# Patient Record
Sex: Female | Born: 1955
Health system: Southern US, Community
[De-identification: ages and names within clinical notes are randomized; demographics above are authoritative.]

## PROBLEM LIST (undated history)

## (undated) DIAGNOSIS — K219 Gastro-esophageal reflux disease without esophagitis: Secondary | ICD-10-CM

## (undated) DIAGNOSIS — M26609 Unspecified temporomandibular joint disorder, unspecified side: Secondary | ICD-10-CM

## (undated) DIAGNOSIS — F411 Generalized anxiety disorder: Secondary | ICD-10-CM

## (undated) DIAGNOSIS — G709 Myoneural disorder, unspecified: Secondary | ICD-10-CM

## (undated) DIAGNOSIS — E039 Hypothyroidism, unspecified: Secondary | ICD-10-CM

## (undated) DIAGNOSIS — M899 Disorder of bone, unspecified: Secondary | ICD-10-CM

## (undated) DIAGNOSIS — Z87898 Personal history of other specified conditions: Secondary | ICD-10-CM

## (undated) DIAGNOSIS — J309 Allergic rhinitis, unspecified: Secondary | ICD-10-CM

## (undated) DIAGNOSIS — C541 Malignant neoplasm of endometrium: Secondary | ICD-10-CM

## (undated) DIAGNOSIS — IMO0002 Reserved for concepts with insufficient information to code with codable children: Secondary | ICD-10-CM

## (undated) DIAGNOSIS — C801 Malignant (primary) neoplasm, unspecified: Secondary | ICD-10-CM

## (undated) DIAGNOSIS — M1990 Unspecified osteoarthritis, unspecified site: Secondary | ICD-10-CM

## (undated) DIAGNOSIS — D649 Anemia, unspecified: Secondary | ICD-10-CM

## (undated) DIAGNOSIS — J189 Pneumonia, unspecified organism: Secondary | ICD-10-CM

## (undated) DIAGNOSIS — M8430XA Stress fracture, unspecified site, initial encounter for fracture: Secondary | ICD-10-CM

## (undated) DIAGNOSIS — I499 Cardiac arrhythmia, unspecified: Secondary | ICD-10-CM

## (undated) DIAGNOSIS — IMO0001 Reserved for inherently not codable concepts without codable children: Secondary | ICD-10-CM

## (undated) DIAGNOSIS — K279 Peptic ulcer, site unspecified, unspecified as acute or chronic, without hemorrhage or perforation: Secondary | ICD-10-CM

## (undated) DIAGNOSIS — M949 Disorder of cartilage, unspecified: Secondary | ICD-10-CM

## (undated) DIAGNOSIS — J45909 Unspecified asthma, uncomplicated: Secondary | ICD-10-CM

## (undated) HISTORY — DX: Anemia, unspecified: D64.9

## (undated) HISTORY — DX: Disorder of bone, unspecified: M89.9

## (undated) HISTORY — PX: OTHER SURGICAL HISTORY: SHX169

## (undated) HISTORY — DX: Reserved for inherently not codable concepts without codable children: IMO0001

## (undated) HISTORY — DX: Hypothyroidism, unspecified: E03.9

## (undated) HISTORY — DX: Generalized anxiety disorder: F41.1

## (undated) HISTORY — DX: Stress fracture, unspecified site, initial encounter for fracture: M84.30XA

## (undated) HISTORY — DX: Unspecified temporomandibular joint disorder, unspecified side: M26.609

## (undated) HISTORY — DX: Peptic ulcer, site unspecified, unspecified as acute or chronic, without hemorrhage or perforation: K27.9

## (undated) HISTORY — DX: Gastro-esophageal reflux disease without esophagitis: K21.9

## (undated) HISTORY — DX: Disorder of cartilage, unspecified: M94.9

## (undated) HISTORY — DX: Malignant (primary) neoplasm, unspecified: C80.1

## (undated) HISTORY — DX: Unspecified asthma, uncomplicated: J45.909

## (undated) HISTORY — DX: Personal history of other specified conditions: Z87.898

## (undated) HISTORY — DX: Reserved for concepts with insufficient information to code with codable children: IMO0002

## (undated) HISTORY — PX: DILATION AND CURETTAGE OF UTERUS: SHX78

## (undated) HISTORY — DX: Allergic rhinitis, unspecified: J30.9

---

## 1998-02-05 ENCOUNTER — Encounter: Admission: RE | Admit: 1998-02-05 | Discharge: 1998-02-05 | Payer: Self-pay | Admitting: *Deleted

## 1998-02-21 ENCOUNTER — Other Ambulatory Visit: Admission: RE | Admit: 1998-02-21 | Discharge: 1998-02-21 | Payer: Self-pay | Admitting: Gynecology

## 1999-02-25 ENCOUNTER — Other Ambulatory Visit: Admission: RE | Admit: 1999-02-25 | Discharge: 1999-02-25 | Payer: Self-pay | Admitting: Gynecology

## 1999-06-10 ENCOUNTER — Encounter: Admission: RE | Admit: 1999-06-10 | Discharge: 1999-07-16 | Payer: Self-pay | Admitting: Internal Medicine

## 1999-10-29 LAB — HM MAMMOGRAPHY

## 2000-03-23 ENCOUNTER — Other Ambulatory Visit: Admission: RE | Admit: 2000-03-23 | Discharge: 2000-03-23 | Payer: Self-pay | Admitting: Gynecology

## 2001-03-11 ENCOUNTER — Other Ambulatory Visit: Admission: RE | Admit: 2001-03-11 | Discharge: 2001-03-11 | Payer: Self-pay | Admitting: Gynecology

## 2002-03-15 ENCOUNTER — Other Ambulatory Visit: Admission: RE | Admit: 2002-03-15 | Discharge: 2002-03-15 | Payer: Self-pay | Admitting: Gynecology

## 2003-03-27 ENCOUNTER — Other Ambulatory Visit: Admission: RE | Admit: 2003-03-27 | Discharge: 2003-03-27 | Payer: Self-pay | Admitting: Gynecology

## 2003-11-06 ENCOUNTER — Ambulatory Visit (HOSPITAL_COMMUNITY): Admission: RE | Admit: 2003-11-06 | Discharge: 2003-11-06 | Payer: Self-pay | Admitting: Gastroenterology

## 2004-08-28 ENCOUNTER — Other Ambulatory Visit: Admission: RE | Admit: 2004-08-28 | Discharge: 2004-08-28 | Payer: Self-pay | Admitting: Gynecology

## 2004-09-16 ENCOUNTER — Ambulatory Visit: Payer: Self-pay | Admitting: Internal Medicine

## 2005-09-01 ENCOUNTER — Ambulatory Visit: Payer: Self-pay | Admitting: Internal Medicine

## 2006-03-04 ENCOUNTER — Ambulatory Visit: Payer: Self-pay | Admitting: Internal Medicine

## 2006-07-29 ENCOUNTER — Ambulatory Visit: Payer: Self-pay | Admitting: Internal Medicine

## 2006-08-12 ENCOUNTER — Ambulatory Visit: Payer: Self-pay | Admitting: Internal Medicine

## 2006-08-12 LAB — CONVERTED CEMR LAB
ALT: 18 units/L (ref 0–40)
AST: 25 units/L (ref 0–37)
Albumin: 3.6 g/dL (ref 3.5–5.2)
Alkaline Phosphatase: 52 units/L (ref 39–117)
BUN: 14 mg/dL (ref 6–23)
Basophils Absolute: 0 10*3/uL (ref 0.0–0.1)
Basophils Relative: 0.5 % (ref 0.0–1.0)
Bilirubin Urine: NEGATIVE
Bilirubin, Direct: 0.1 mg/dL (ref 0.0–0.3)
CO2: 32 meq/L (ref 19–32)
Calcium: 8.9 mg/dL (ref 8.4–10.5)
Chloride: 104 meq/L (ref 96–112)
Cholesterol: 195 mg/dL (ref 0–200)
Creatinine, Ser: 0.8 mg/dL (ref 0.4–1.2)
Eosinophils Absolute: 0.1 10*3/uL (ref 0.0–0.6)
Eosinophils Relative: 3.1 % (ref 0.0–5.0)
GFR calc Af Amer: 98 mL/min
GFR calc non Af Amer: 81 mL/min
Glucose, Bld: 90 mg/dL (ref 70–99)
HCT: 39.8 % (ref 36.0–46.0)
HDL: 75.9 mg/dL (ref >39.0)
Hemoglobin, Urine: NEGATIVE
Hemoglobin: 14.2 g/dL (ref 12.0–15.0)
Ketones, ur: NEGATIVE mg/dL
LDL Cholesterol: 107 mg/dL — ABNORMAL HIGH (ref 0–99)
Leukocytes, UA: NEGATIVE
Lymphocytes Relative: 35.7 % (ref 12.0–46.0)
MCHC: 35.6 g/dL (ref 30.0–36.0)
MCV: 91.2 fL (ref 78.0–100.0)
Monocytes Absolute: 0.3 10*3/uL (ref 0.2–0.7)
Monocytes Relative: 6.7 % (ref 3.0–11.0)
Neutro Abs: 2.1 10*3/uL (ref 1.4–7.7)
Neutrophils Relative %: 54 % (ref 43.0–77.0)
Nitrite: NEGATIVE
Platelets: 258 10*3/uL (ref 150–400)
Potassium: 3.9 meq/L (ref 3.5–5.1)
RBC: 4.36 M/uL (ref 3.87–5.11)
RDW: 12.3 % (ref 11.5–14.6)
Sodium: 142 meq/L (ref 135–145)
Specific Gravity, Urine: >=1.03 (ref 1.000–1.03)
TSH: 2.24 microintl units/mL (ref 0.35–5.50)
Total Bilirubin: 1.1 mg/dL (ref 0.3–1.2)
Total CHOL/HDL Ratio: 2.6
Total Protein, Urine: NEGATIVE mg/dL
Total Protein: 6 g/dL (ref 6.0–8.3)
Triglycerides: 60 mg/dL (ref 0–149)
Urine Glucose: NEGATIVE mg/dL
Urobilinogen, UA: 0.2 (ref 0.0–1.0)
VLDL: 12 mg/dL (ref 0–40)
WBC: 3.9 10*3/uL — ABNORMAL LOW (ref 4.5–10.5)
pH: 6 (ref 5.0–8.0)

## 2006-08-14 ENCOUNTER — Ambulatory Visit: Payer: Self-pay | Admitting: Internal Medicine

## 2006-09-22 ENCOUNTER — Ambulatory Visit: Payer: Self-pay | Admitting: Internal Medicine

## 2006-10-23 ENCOUNTER — Ambulatory Visit: Payer: Self-pay | Admitting: Internal Medicine

## 2007-02-19 ENCOUNTER — Ambulatory Visit: Payer: Self-pay | Admitting: Internal Medicine

## 2007-03-02 ENCOUNTER — Ambulatory Visit: Payer: Self-pay | Admitting: Internal Medicine

## 2007-03-02 DIAGNOSIS — K219 Gastro-esophageal reflux disease without esophagitis: Secondary | ICD-10-CM

## 2007-03-02 DIAGNOSIS — Z87898 Personal history of other specified conditions: Secondary | ICD-10-CM | POA: Insufficient documentation

## 2007-03-02 DIAGNOSIS — J309 Allergic rhinitis, unspecified: Secondary | ICD-10-CM | POA: Insufficient documentation

## 2007-03-02 DIAGNOSIS — F411 Generalized anxiety disorder: Secondary | ICD-10-CM

## 2007-03-02 DIAGNOSIS — K279 Peptic ulcer, site unspecified, unspecified as acute or chronic, without hemorrhage or perforation: Secondary | ICD-10-CM | POA: Insufficient documentation

## 2007-03-02 DIAGNOSIS — Z85828 Personal history of other malignant neoplasm of skin: Secondary | ICD-10-CM | POA: Insufficient documentation

## 2007-03-02 HISTORY — DX: Allergic rhinitis, unspecified: J30.9

## 2007-03-02 HISTORY — DX: Gastro-esophageal reflux disease without esophagitis: K21.9

## 2007-03-02 HISTORY — DX: Personal history of other specified conditions: Z87.898

## 2007-03-02 HISTORY — DX: Generalized anxiety disorder: F41.1

## 2007-03-02 HISTORY — DX: Peptic ulcer, site unspecified, unspecified as acute or chronic, without hemorrhage or perforation: K27.9

## 2007-05-18 ENCOUNTER — Encounter: Payer: Self-pay | Admitting: Internal Medicine

## 2007-06-23 ENCOUNTER — Emergency Department (HOSPITAL_COMMUNITY): Admission: EM | Admit: 2007-06-23 | Discharge: 2007-06-23 | Payer: Self-pay | Admitting: Family Medicine

## 2007-07-01 HISTORY — PX: OTHER SURGICAL HISTORY: SHX169

## 2007-08-02 ENCOUNTER — Encounter: Payer: Self-pay | Admitting: Internal Medicine

## 2007-11-08 ENCOUNTER — Ambulatory Visit: Payer: Self-pay | Admitting: Internal Medicine

## 2007-11-08 LAB — CONVERTED CEMR LAB: Vit D, 1,25-Dihydroxy: 39 (ref 30–89)

## 2007-11-12 ENCOUNTER — Ambulatory Visit: Payer: Self-pay | Admitting: Internal Medicine

## 2007-11-12 DIAGNOSIS — M26609 Unspecified temporomandibular joint disorder, unspecified side: Secondary | ICD-10-CM

## 2007-11-12 DIAGNOSIS — J45909 Unspecified asthma, uncomplicated: Secondary | ICD-10-CM | POA: Insufficient documentation

## 2007-11-12 DIAGNOSIS — M810 Age-related osteoporosis without current pathological fracture: Secondary | ICD-10-CM | POA: Insufficient documentation

## 2007-11-12 DIAGNOSIS — E039 Hypothyroidism, unspecified: Secondary | ICD-10-CM | POA: Insufficient documentation

## 2007-11-12 DIAGNOSIS — M899 Disorder of bone, unspecified: Secondary | ICD-10-CM

## 2007-11-12 HISTORY — DX: Unspecified asthma, uncomplicated: J45.909

## 2007-11-12 HISTORY — DX: Hypothyroidism, unspecified: E03.9

## 2007-11-12 HISTORY — DX: Disorder of bone, unspecified: M89.9

## 2007-11-12 HISTORY — DX: Unspecified temporomandibular joint disorder, unspecified side: M26.609

## 2007-11-12 LAB — CONVERTED CEMR LAB
ALT: 20 units/L (ref 0–35)
AST: 26 units/L (ref 0–37)
Albumin: 3.9 g/dL (ref 3.5–5.2)
Alkaline Phosphatase: 62 units/L (ref 39–117)
BUN: 18 mg/dL (ref 6–23)
Basophils Absolute: 0 10*3/uL (ref 0.0–0.1)
Basophils Relative: 0.5 % (ref 0.0–1.0)
Bilirubin Urine: NEGATIVE
Bilirubin, Direct: 0.1 mg/dL (ref 0.0–0.3)
CO2: 30 meq/L (ref 19–32)
Calcium: 9.2 mg/dL (ref 8.4–10.5)
Chloride: 108 meq/L (ref 96–112)
Cholesterol: 190 mg/dL (ref 0–200)
Creatinine, Ser: 0.7 mg/dL (ref 0.4–1.2)
Eosinophils Absolute: 0.1 10*3/uL (ref 0.0–0.7)
Eosinophils Relative: 2.9 % (ref 0.0–5.0)
GFR calc Af Amer: 114 mL/min
GFR calc non Af Amer: 94 mL/min
Glucose, Bld: 91 mg/dL (ref 70–99)
HCT: 40 % (ref 36.0–46.0)
HDL: 79.4 mg/dL (ref >39.0)
Hemoglobin, Urine: NEGATIVE
Hemoglobin: 13.7 g/dL (ref 12.0–15.0)
Ketones, ur: NEGATIVE mg/dL
LDL Cholesterol: 100 mg/dL — ABNORMAL HIGH (ref 0–99)
Leukocytes, UA: NEGATIVE
Lymphocytes Relative: 37.8 % (ref 12.0–46.0)
MCHC: 34.3 g/dL (ref 30.0–36.0)
MCV: 93.3 fL (ref 78.0–100.0)
Monocytes Absolute: 0.3 10*3/uL (ref 0.1–1.0)
Monocytes Relative: 9.5 % (ref 3.0–12.0)
Neutro Abs: 1.8 10*3/uL (ref 1.4–7.7)
Neutrophils Relative %: 49.3 % (ref 43.0–77.0)
Nitrite: NEGATIVE
Platelets: 212 10*3/uL (ref 150–400)
Potassium: 4.3 meq/L (ref 3.5–5.1)
RBC: 4.29 M/uL (ref 3.87–5.11)
RDW: 12.5 % (ref 11.5–14.6)
Sodium: 142 meq/L (ref 135–145)
Specific Gravity, Urine: 1.02 (ref 1.000–1.03)
TSH: 2.2 microintl units/mL (ref 0.35–5.50)
Total Bilirubin: 1.1 mg/dL (ref 0.3–1.2)
Total CHOL/HDL Ratio: 2.4
Total Protein, Urine: NEGATIVE mg/dL
Total Protein: 6.2 g/dL (ref 6.0–8.3)
Triglycerides: 51 mg/dL (ref 0–149)
Urine Glucose: NEGATIVE mg/dL
Urobilinogen, UA: 0.2 (ref 0.0–1.0)
VLDL: 10 mg/dL (ref 0–40)
WBC: 3.6 10*3/uL — ABNORMAL LOW (ref 4.5–10.5)
pH: 6 (ref 5.0–8.0)

## 2008-03-13 ENCOUNTER — Encounter: Payer: Self-pay | Admitting: Internal Medicine

## 2008-11-22 ENCOUNTER — Ambulatory Visit: Payer: Self-pay | Admitting: Internal Medicine

## 2008-11-23 LAB — CONVERTED CEMR LAB
ALT: 16 units/L (ref 0–35)
AST: 24 units/L (ref 0–37)
Albumin: 3.9 g/dL (ref 3.5–5.2)
Alkaline Phosphatase: 57 units/L (ref 39–117)
BUN: 15 mg/dL (ref 6–23)
Basophils Absolute: 0 10*3/uL (ref 0.0–0.1)
Basophils Relative: 0.5 % (ref 0.0–3.0)
Bilirubin Urine: NEGATIVE
Bilirubin, Direct: 0.2 mg/dL (ref 0.0–0.3)
CO2: 32 meq/L (ref 19–32)
Calcium: 9.1 mg/dL (ref 8.4–10.5)
Chloride: 107 meq/L (ref 96–112)
Cholesterol: 206 mg/dL — ABNORMAL HIGH (ref 0–200)
Creatinine, Ser: 0.8 mg/dL (ref 0.4–1.2)
Direct LDL: 105.7 mg/dL
Eosinophils Absolute: 0.1 10*3/uL (ref 0.0–0.7)
Eosinophils Relative: 3.3 % (ref 0.0–5.0)
GFR calc non Af Amer: 80.06 mL/min (ref >60)
Glucose, Bld: 69 mg/dL — ABNORMAL LOW (ref 70–99)
HCT: 41.5 % (ref 36.0–46.0)
HDL: 90.6 mg/dL (ref >39.00)
Hemoglobin, Urine: NEGATIVE
Hemoglobin: 14.3 g/dL (ref 12.0–15.0)
Ketones, ur: NEGATIVE mg/dL
Leukocytes, UA: NEGATIVE
Lymphocytes Relative: 38.4 % (ref 12.0–46.0)
Lymphs Abs: 1.5 10*3/uL (ref 0.7–4.0)
MCHC: 34.6 g/dL (ref 30.0–36.0)
MCV: 92.8 fL (ref 78.0–100.0)
Monocytes Absolute: 0.3 10*3/uL (ref 0.1–1.0)
Monocytes Relative: 7.4 % (ref 3.0–12.0)
Neutro Abs: 1.9 10*3/uL (ref 1.4–7.7)
Neutrophils Relative %: 50.4 % (ref 43.0–77.0)
Nitrite: NEGATIVE
Platelets: 207 10*3/uL (ref 150.0–400.0)
Potassium: 3.8 meq/L (ref 3.5–5.1)
RBC: 4.47 M/uL (ref 3.87–5.11)
RDW: 12.3 % (ref 11.5–14.6)
Sodium: 142 meq/L (ref 135–145)
Specific Gravity, Urine: 1.015 (ref 1.000–1.030)
Total Bilirubin: 1.3 mg/dL — ABNORMAL HIGH (ref 0.3–1.2)
Total CHOL/HDL Ratio: 2
Total Protein, Urine: NEGATIVE mg/dL
Total Protein: 6.5 g/dL (ref 6.0–8.3)
Triglycerides: 74 mg/dL (ref 0.0–149.0)
Urine Glucose: NEGATIVE mg/dL
Urobilinogen, UA: 0.2 (ref 0.0–1.0)
VLDL: 14.8 mg/dL (ref 0.0–40.0)
WBC: 3.8 10*3/uL — ABNORMAL LOW (ref 4.5–10.5)
pH: 6 (ref 5.0–8.0)

## 2008-11-28 ENCOUNTER — Ambulatory Visit: Payer: Self-pay | Admitting: Internal Medicine

## 2009-11-23 ENCOUNTER — Encounter: Payer: Self-pay | Admitting: Internal Medicine

## 2010-03-11 ENCOUNTER — Encounter: Payer: Self-pay | Admitting: Internal Medicine

## 2010-07-21 ENCOUNTER — Encounter: Payer: Self-pay | Admitting: Gastroenterology

## 2010-07-30 NOTE — Letter (Signed)
Summary: [facility]  [facility]   Imported By: Sherian Rein 01/02/2010 11:26:17  _____________________________________________________________________  External Attachment:    Type:   Image     Comment:   External Document

## 2010-07-30 NOTE — Letter (Signed)
Summary: [facility]  [facility]   Imported By: Esmeralda Links D'jimraou 05/06/2008 11:17:21  _____________________________________________________________________  External Attachment:    Type:   Image     Comment:   External Document

## 2010-07-30 NOTE — Letter (Signed)
Summary: [facility]   Imported By: Sherian Rein 03/26/2010 15:09:07  _____________________________________________________________________  External Attachment:    Type:   Image     Comment:   External Document

## 2010-07-30 NOTE — Assessment & Plan Note (Signed)
Summary: CPX/ NO PAP /$50 /NWS   Vital Signs:  Patient Profile:   55 Years Old Female Weight:      129 pounds Temp:     97.6 degrees F oral Pulse rate:   69 / minute BP sitting:   111 / 67  (left arm)  Vitals Entered By: Tora Perches (Nov 12, 2007 2:10 PM)                 Chief Complaint:  Preventive Care.  History of Present Illness: The patient presents for a wellness examination, preop Req. by Dr Chales Salmon DDS, MD Reason; Med clearance for Surgery in July Hx: Need to discuss asthma, hypothyroidism and allergies     Current Allergies (reviewed today): ! PCN ! CODEINE ! IODINE ! * SHELLFISH  Past Medical History:    Reviewed history from 03/02/2007 and no changes required:       Skin cancer, hx of       GERD       Peptic ulcer disease       Fibrocystic Breast Disease       Allergic rhinitis       Anxiety       Asthma 2009 Dr [name]       Osteopenia       TMJ       Hypothyroidism   Family History:    Reviewed history and no changes required:       Family History Hypertension       No asthma  Social History:    Reviewed history and no changes required:       Occupation: PT       Married 1 child       Never Smoked       Regular exercise-yes   Risk Factors:  Tobacco use:  never Exercise:  yes   Review of Systems       C/o low libido   Physical Exam  General:     Well-developed,well-nourished,in no acute distress; alert,appropriate and cooperative throughout examination Eyes:     No corneal or conjunctival inflammation noted. EOMI. Perrla. Funduscopic exam benign, without hemorrhages, exudates or papilledema. Vision grossly normal. Ears:     External ear exam shows no significant lesions or deformities.  Otoscopic examination reveals clear canals, tympanic membranes are intact bilaterally without bulging, retraction, inflammation or discharge. Hearing is grossly normal bilaterally. Nose:     External nasal examination shows no deformity or  inflammation. Nasal mucosa are pink and moist without lesions or exudates. Mouth:     Oral mucosa and oropharynx without lesions or exudates.  Teeth in good repair. Neck:     No deformities, masses, or tenderness noted. Lungs:     Normal respiratory effort, chest expands symmetrically. Lungs are clear to auscultation, no crackles or wheezes. Heart:     Normal rate and regular rhythm. S1 and S2 normal without gallop, murmur, click, rub or other extra sounds. Abdomen:     Bowel sounds positive,abdomen soft and non-tender without masses, organomegaly or hernias noted. Msk:     No deformity or scoliosis noted of thoracic or lumbar spine.   Pulses:     R and L carotid,radial,femoral,dorsalis pedis and posterior tibial pulses are full and equal bilaterally Extremities:     No clubbing, cyanosis, edema, or deformity noted with normal full range of motion of all joints.   Neurologic:     No cranial nerve deficits noted. Station and gait are normal. Plantar  reflexes are down-going bilaterally. DTRs are symmetrical throughout. Sensory, motor and coordinative functions appear intact. Skin:     Intact without suspicious lesions or rashes Cervical Nodes:     No lymphadenopathy noted Inguinal Nodes:     No significant adenopathy Psych:     Cognition and judgment appear intact. Alert and cooperative with normal attention span and concentration. No apparent delusions, illusions, hallucinations    Impression & Recommendations:  Problem # 1:  PREOPERATIVE EXAMINATION (ICD-V72.84) Assessment: Comment Only Med clear for surgery. EKG is nl, labs are nl.   Thank you! Orders: EKG w/ Interpretation (93000)   Problem # 2:  TMJ SYNDROME (ICD-524.60) Assessment: Unchanged As above; surg. in July   Problem # 3:  ASTHMA (ICD-493.90) Assessment: Improved  Her updated medication list for this problem includes:    Advair Diskus 100-50 Mcg/dose Misc (Fluticasone-salmeterol) .Marland Kitchen..Marland Kitchen Two times a day     Singulair 10 Mg Tabs (Montelukast sodium) ..... Once daily   Problem # 4:  ALLERGIC RHINITIS (ICD-477.9) Assessment: Improved  Her updated medication list for this problem includes:    Nasacort Aq 55 Mcg/act Aers (Triamcinolone acetonide(nasal)) ..... Once daily   Problem # 5:  OSTEOPENIA (ICD-733.90) Assessment: Unchanged  Her updated medication list for this problem includes:    Vitamin D3 1000 Unit Tabs (Cholecalciferol) .Marland Kitchen... 1 by mouth daily   Problem # 6:  ANXIETY (ICD-300.00) Assessment: Improved  Problem # 7:  HYPOTHYROIDISM (ICD-244.9) Assessment: Unchanged  Her updated medication list for this problem includes:    Synthroid 25 Mcg Tabs (Levothyroxine sodium) ..... Once daily    Cytomel 5 Mcg Tabs (Liothyronine sodium) .Marland Kitchen..Marland Kitchen Two times a day   Complete Medication List: 1)  Synthroid 25 Mcg Tabs (Levothyroxine sodium) .... Once daily 2)  Metrolotion 0.75 % Lotn (Metronidazole) .... Two times a day 3)  Cytomel 5 Mcg Tabs (Liothyronine sodium) .... Two times a day 4)  Advair Diskus 100-50 Mcg/dose Misc (Fluticasone-salmeterol) .... Two times a day 5)  Singulair 10 Mg Tabs (Montelukast sodium) .... Once daily 6)  Nasacort Aq 55 Mcg/act Aers (Triamcinolone acetonide(nasal)) .... Once daily 7)  Vitamin D3 1000 Unit Tabs (Cholecalciferol) .Marland Kitchen.. 1 by mouth daily   Patient Instructions: 1)  Please schedule a follow-up appointment in 1 year well with labs. 2)  L-arginine or Arginmax to try   ]  Appended Document: CPX/ NO PAP /$50 /NWS copy faxed to Dr. Chales Salmon 11-12-2007/vg

## 2010-10-03 ENCOUNTER — Encounter: Payer: Self-pay | Admitting: Internal Medicine

## 2010-10-07 ENCOUNTER — Ambulatory Visit (INDEPENDENT_AMBULATORY_CARE_PROVIDER_SITE_OTHER): Payer: BLUE CROSS/BLUE SHIELD | Admitting: Internal Medicine

## 2010-10-07 ENCOUNTER — Encounter: Payer: Self-pay | Admitting: Internal Medicine

## 2010-10-07 VITALS — BP 110/76 | HR 67 | Temp 98.4°F | Ht 64.0 in | Wt 132.8 lb

## 2010-10-07 DIAGNOSIS — M722 Plantar fascial fibromatosis: Secondary | ICD-10-CM

## 2010-10-07 MED ORDER — DICLOFENAC SODIUM 1.5 % TD SOLN: TRANSDERMAL | Status: DC

## 2010-10-07 NOTE — Progress Notes (Signed)
  Subjective:    Patient ID: [name], female    DOB: 03-15-1956, 55 y.o.   MRN: [id number]  HPI  New to me but know to my partner AVP - complains of of bilateral foot pain Onset months ago, worse in past weeks +hx same - currently exacerbated by prolonged standing Affects L>R arch, no heel pain Uses orthotics but >20y old and no longer supportive Also uses heel cup stabilizer and new shoes without pain resolution Compliant with stretches and ice massage  Past Medical History  Diagnosis Date  . HYPOTHYROIDISM 11/12/2007  . ANXIETY 03/02/2007  . ALLERGIC RHINITIS 03/02/2007  . ASTHMA 11/12/2007  . TMJ SYNDROME 11/12/2007  . GERD 03/02/2007  . PEPTIC ULCER DISEASE 03/02/2007  . OSTEOPENIA 11/12/2007  . SKIN CANCER, HX OF 03/02/2007  . FIBROCYSTIC BREAST DISEASE, HX OF 03/02/2007   Review of Systems  Constitutional: Positive for fatigue. Negative for fever.  Musculoskeletal: Negative for back pain and joint swelling.  Skin: Negative for wound.       Objective:   Physical Exam  Constitutional: She is oriented to person, place, and time. She appears well-developed and well-nourished. No distress.  Musculoskeletal: She exhibits tenderness (knot and tenderness in L>R foot arch tolight palpation, no heel pain).  Neurological: She is alert and oriented to person, place, and time. Coordination normal.  Skin: Skin is warm and dry. No rash noted. No erythema.  Psychiatric: She has a normal mood and affect. Her behavior is normal.   BP 110/76  Pulse 67  Temp(Src) 98.4 F (36.9 C) (Oral)  Ht 5\' 4"  (1.626 m)  Wt 132 lb 12.8 oz (60.238 kg)  BMI 22.80 kg/m2  SpO2 97% Lab Results  Component Value Date   WBC 3.8* 11/22/2008   HGB 14.3 11/22/2008   HGB NEGATIVE 11/22/2008   HCT 41.5 11/22/2008   PLT 207.0 11/22/2008   CHOL 206* 11/22/2008   TRIG 74.0 11/22/2008   HDL 90.60 11/22/2008   LDLDIRECT 105.7 11/22/2008   ALT 16 11/22/2008   AST 24 11/22/2008   NA 142 11/22/2008   K 3.8 11/22/2008   CL  107 11/22/2008   CREATININE 0.8 11/22/2008   BUN 15 11/22/2008   CO2 32 11/22/2008   TSH 2.20 11/08/2007      Assessment & Plan:  See problem list. Medications and labs reviewed today.

## 2010-10-07 NOTE — Patient Instructions (Signed)
It was good to see you today. we'll make referral to Dr. Darrick Penna and sports medicine team for evaluation and new foot orthotics. Our office will contact you regarding appointment(s) once made. Use Pennsiad gel for pain as discussed until that time - Your prescription(s) have been submitted to your pharmacy. Please take as directed and contact our office if you believe you are having problem(s) with the medication(s).

## 2010-10-07 NOTE — Assessment & Plan Note (Addendum)
History of same - prior orthotics >20y old and no longer supportive New job involves prolonged standing which aggrevates condition New rx for B orthotic inserts provided and refer to sports med clinic ([facility]) Also topical NSIADs to use for pain relief until further eval and tx

## 2010-10-28 ENCOUNTER — Ambulatory Visit (INDEPENDENT_AMBULATORY_CARE_PROVIDER_SITE_OTHER): Payer: BLUE CROSS/BLUE SHIELD | Admitting: Sports Medicine

## 2010-10-28 VITALS — BP 113/70 | HR 82 | Ht 64.0 in | Wt 127.0 lb

## 2010-10-28 DIAGNOSIS — M722 Plantar fascial fibromatosis: Secondary | ICD-10-CM

## 2010-10-28 DIAGNOSIS — M6580 Other synovitis and tenosynovitis, unspecified site: Secondary | ICD-10-CM

## 2010-10-28 DIAGNOSIS — M76899 Other specified enthesopathies of unspecified lower limb, excluding foot: Secondary | ICD-10-CM | POA: Insufficient documentation

## 2010-10-28 MED ORDER — NITROGLYCERIN 0.2 MG/HR TD PT24: MEDICATED_PATCH | TRANSDERMAL | Status: DC

## 2010-10-28 NOTE — Progress Notes (Signed)
  Subjective:    Patient ID: [name], female    DOB: [birth year], 55 y.o.   MRN: [id number]  HPI Pt c/o right hip/pelvic pain, left buttock pain and left heel pain. The right hip pain began in February after pt was repetitively climbing over a large wall. This motion involved significant hip flexion and pushing off with her right leg. The pain is located directly over her right ischial tuberosity and is exacerbated by sitting. She has been doing hamstring stretches but has not had much relief. The pain in her left buttock is described as an ache with occasional spasm. She notices this more when she is walking - especially on an incline surface. No LBP or pain radiating down the leg. This pain in her left buttock began around the same time as the right side. She has been doing abduction and external rotation exercises for this and has not seen any relief. Pt also c/o left heel pain. She has a history of plantar fascitis but has recently noticed a small knot in the soft tissue of the arch and is concerned about this. She has pain over the origin of the plantar fascia and also over the soft tissue knot. Her pain is worse with the first few steps in the morning and after standing for long periods of time. She has been using a tennis ball and golf ball on the foot but hasn't seen much improvement.      Review of Systems     Objective:   Physical Exam    Pelvis: ASIS and PSIS are symmetrical. Good SI motion with compression and pelvic rock. Leg length are statically symmetrical and show a .5cm functional discrepancy R>L. Tender to palpate over right ischial tuberosity. Diffusely tender to palpate over left gluteus maximus and medius. Normal ROM and 5/5 muscle strength with flexion, extension, abduction, adduction, int/ext rotation.   Foot: tender to palpate over origin of plantar fascia. Small tender soft tissue nodule palpated at the medial arch. Loss of medial longitudinal arch noted as well as  pronation. No splaying of toes.  Good posterior tibialis function.   Ultrasound:  Foot: increasing thickness of plantar fascia while scanning more distal. Area of splitting and nodular formation in mid arch that measures 0.46 cm vs 0.41 at calcaneus.  On trans scan this is 80%  through the fascia with hypoechoic change Pelvis: small avulsion of ischial tuberosity with calcification of the tendon sheath. Diffuse increase in capillary flow consistent with inflammation. Hypoechoic change around the insertion of tendon particularly on trans scan;    Assessment & Plan:

## 2010-10-28 NOTE — Assessment & Plan Note (Signed)
We will begin with a series of simple hamstring exercises to encourage strengthening. Stretching may not be helpful as this seems to have a small avulsion fragment at the insertion. We can also consider adding a hamstring compression sleeve if she is not getting enough relief but we'll start with the exercise regimen first.

## 2010-10-28 NOTE — Assessment & Plan Note (Addendum)
She will need to use her straps and scaphoid pads to support her left arch when she is standing or active. With a split in the fascia I would not recommend stretches her standard exercises at this time but may want to start those on her return to clinic in approximately 6-8 weeks.  The scaphoid patch should also help with her excess pronation.

## 2010-11-11 ENCOUNTER — Other Ambulatory Visit (INDEPENDENT_AMBULATORY_CARE_PROVIDER_SITE_OTHER): Payer: BLUE CROSS/BLUE SHIELD | Admitting: Internal Medicine

## 2010-11-11 ENCOUNTER — Other Ambulatory Visit (INDEPENDENT_AMBULATORY_CARE_PROVIDER_SITE_OTHER): Payer: BLUE CROSS/BLUE SHIELD

## 2010-11-11 ENCOUNTER — Other Ambulatory Visit: Payer: Self-pay | Admitting: Internal Medicine

## 2010-11-11 DIAGNOSIS — Z13220 Encounter for screening for lipoid disorders: Secondary | ICD-10-CM

## 2010-11-11 DIAGNOSIS — Z0000 Encounter for general adult medical examination without abnormal findings: Secondary | ICD-10-CM

## 2010-11-11 LAB — BASIC METABOLIC PANEL
BUN: 16 mg/dL (ref 6–23)
CO2: 29 mEq/L (ref 19–32)
Calcium: 9.1 mg/dL (ref 8.4–10.5)
Chloride: 100 mEq/L (ref 96–112)
Creatinine, Ser: 0.8 mg/dL (ref 0.4–1.2)
GFR: 85.6 mL/min (ref >60.00)
Glucose, Bld: 73 mg/dL (ref 70–99)
Potassium: 4 mEq/L (ref 3.5–5.1)
Sodium: 136 mEq/L (ref 135–145)

## 2010-11-11 LAB — CBC WITH DIFFERENTIAL/PLATELET
Basophils Absolute: 0 10*3/uL (ref 0.0–0.1)
Basophils Relative: 0.3 % (ref 0.0–3.0)
Eosinophils Absolute: 0.1 10*3/uL (ref 0.0–0.7)
Eosinophils Relative: 2.8 % (ref 0.0–5.0)
HCT: 40.2 % (ref 36.0–46.0)
Hemoglobin: 14.1 g/dL (ref 12.0–15.0)
Lymphocytes Relative: 30.3 % (ref 12.0–46.0)
Lymphs Abs: 1.2 10*3/uL (ref 0.7–4.0)
MCHC: 35 g/dL (ref 30.0–36.0)
MCV: 92.4 fl (ref 78.0–100.0)
Monocytes Absolute: 0.3 10*3/uL (ref 0.1–1.0)
Monocytes Relative: 7.2 % (ref 3.0–12.0)
Neutro Abs: 2.4 10*3/uL (ref 1.4–7.7)
Neutrophils Relative %: 59.4 % (ref 43.0–77.0)
Platelets: 212 10*3/uL (ref 150.0–400.0)
RBC: 4.35 Mil/uL (ref 3.87–5.11)
RDW: 13.1 % (ref 11.5–14.6)
WBC: 4.1 10*3/uL — ABNORMAL LOW (ref 4.5–10.5)

## 2010-11-11 LAB — URINALYSIS
Bilirubin Urine: NEGATIVE
Hgb urine dipstick: NEGATIVE
Ketones, ur: NEGATIVE
Leukocytes, UA: NEGATIVE
Nitrite: NEGATIVE
Specific Gravity, Urine: 1.025 (ref 1.000–1.030)
Total Protein, Urine: NEGATIVE
Urine Glucose: NEGATIVE
Urobilinogen, UA: 0.2 (ref 0.0–1.0)
pH: 6 (ref 5.0–8.0)

## 2010-11-11 LAB — LIPID PANEL
Cholesterol: 211 mg/dL — ABNORMAL HIGH (ref 0–200)
HDL: 93.3 mg/dL (ref >39.00)
Total CHOL/HDL Ratio: 2
Triglycerides: 71 mg/dL (ref 0.0–149.0)
VLDL: 14.2 mg/dL (ref 0.0–40.0)

## 2010-11-11 LAB — LDL CHOLESTEROL, DIRECT: Direct LDL: 102.8 mg/dL

## 2010-11-11 LAB — HEPATIC FUNCTION PANEL
ALT: 16 U/L (ref 0–35)
AST: 21 U/L (ref 0–37)
Albumin: 3.8 g/dL (ref 3.5–5.2)
Alkaline Phosphatase: 50 U/L (ref 39–117)
Bilirubin, Direct: 0.1 mg/dL (ref 0.0–0.3)
Total Bilirubin: 0.9 mg/dL (ref 0.3–1.2)
Total Protein: 6 g/dL (ref 6.0–8.3)

## 2010-11-11 LAB — TSH: TSH: 0.99 u[IU]/mL (ref 0.35–5.50)

## 2010-11-12 ENCOUNTER — Encounter: Payer: Self-pay | Admitting: Internal Medicine

## 2010-11-14 ENCOUNTER — Ambulatory Visit (INDEPENDENT_AMBULATORY_CARE_PROVIDER_SITE_OTHER): Payer: BLUE CROSS/BLUE SHIELD | Admitting: Internal Medicine

## 2010-11-14 ENCOUNTER — Encounter: Payer: Self-pay | Admitting: Internal Medicine

## 2010-11-14 VITALS — BP 92/54 | HR 72 | Temp 98.9°F | Resp 16 | Ht 64.0 in | Wt 129.0 lb

## 2010-11-14 DIAGNOSIS — Z0000 Encounter for general adult medical examination without abnormal findings: Secondary | ICD-10-CM

## 2010-11-14 DIAGNOSIS — Z136 Encounter for screening for cardiovascular disorders: Secondary | ICD-10-CM

## 2010-11-14 DIAGNOSIS — Z85828 Personal history of other malignant neoplasm of skin: Secondary | ICD-10-CM

## 2010-11-14 DIAGNOSIS — R202 Paresthesia of skin: Secondary | ICD-10-CM

## 2010-11-14 DIAGNOSIS — R209 Unspecified disturbances of skin sensation: Secondary | ICD-10-CM

## 2010-11-14 DIAGNOSIS — E039 Hypothyroidism, unspecified: Secondary | ICD-10-CM

## 2010-11-14 DIAGNOSIS — F411 Generalized anxiety disorder: Secondary | ICD-10-CM

## 2010-11-14 DIAGNOSIS — M26609 Unspecified temporomandibular joint disorder, unspecified side: Secondary | ICD-10-CM

## 2010-11-14 MED ORDER — L-METHYLFOLATE-B6-B12 3-35-2 MG PO TABS: 1.0000 | ORAL_TABLET | Freq: Every day | ORAL | Status: DC

## 2010-11-14 NOTE — Assessment & Plan Note (Signed)
Cont Rx 

## 2010-11-14 NOTE — Progress Notes (Signed)
Subjective:    Patient ID: [name], female    DOB: 1956-06-27, 55 y.o.   MRN: [id number]  HPI  The patient is here for a wellness exam. The patient has been doing well overall without major physical or psychological issues going on lately.C/o B cheek and toungue numbness; c/o anxiety and stress.   Review of Systems  Constitutional: Negative.  Negative for fever, chills, diaphoresis, activity change, appetite change, fatigue and unexpected weight change.  HENT: Negative for hearing loss, ear pain, nosebleeds, congestion, sore throat, facial swelling, rhinorrhea, sneezing, mouth sores, trouble swallowing, neck pain, neck stiffness, postnasal drip, sinus pressure and tinnitus.        Mouth burning   Eyes: Negative for pain, discharge, redness, itching and visual disturbance.  Respiratory: Negative for cough, chest tightness, shortness of breath, wheezing and stridor.   Cardiovascular: Negative for chest pain, palpitations and leg swelling.  Gastrointestinal: Negative for nausea, diarrhea, constipation, blood in stool, abdominal distention, anal bleeding and rectal pain.  Genitourinary: Negative for dysuria, urgency, frequency, hematuria, flank pain, vaginal bleeding, vaginal discharge, difficulty urinating, genital sores and pelvic pain.  Musculoskeletal: Negative for back pain, joint swelling, arthralgias and gait problem.  Skin: Negative.  Negative for rash.  Neurological: Negative for dizziness, tremors, seizures, syncope, speech difficulty, weakness, numbness and headaches.  Hematological: Negative for adenopathy. Does not bruise/bleed easily.  Psychiatric/Behavioral: Negative for suicidal ideas, behavioral problems, sleep disturbance, dysphoric mood and decreased concentration. The patient is not nervous/anxious.        Objective:   Physical Exam  Constitutional: She appears well-developed and well-nourished. No distress.  HENT:  Head: Normocephalic.  Right Ear: External ear  normal.  Left Ear: External ear normal.  Nose: Nose normal.  Mouth/Throat: Oropharynx is clear and moist.  Eyes: Conjunctivae are normal. Pupils are equal, round, and reactive to light. Right eye exhibits no discharge. Left eye exhibits no discharge.  Neck: Normal range of motion. Neck supple. No JVD present. No tracheal deviation present. No thyromegaly present.  Cardiovascular: Normal rate, regular rhythm and normal heart sounds.   Pulmonary/Chest: No stridor. No respiratory distress. She has no wheezes.  Abdominal: Soft. Bowel sounds are normal. She exhibits no distension and no mass. There is no tenderness. There is no rebound and no guarding.  Musculoskeletal: She exhibits no edema and no tenderness.  Lymphadenopathy:    She has no cervical adenopathy.  Neurological: She displays normal reflexes. No cranial nerve deficit. She exhibits normal muscle tone. Coordination normal.  Skin: No rash noted. No erythema.  Psychiatric: She has a normal mood and affect. Her behavior is normal. Judgment and thought content normal.       Lab Results  Component Value Date   WBC 4.1* 11/11/2010   HGB 14.1 11/11/2010   HCT 40.2 11/11/2010   PLT 212.0 11/11/2010   CHOL 211* 11/11/2010   TRIG 71.0 11/11/2010   HDL 93.30 11/11/2010   LDLDIRECT 102.8 11/11/2010   ALT 16 11/11/2010   AST 21 11/11/2010   NA 136 11/11/2010   K 4.0 11/11/2010   CL 100 11/11/2010   CREATININE 0.8 11/11/2010   BUN 16 11/11/2010   CO2 29 11/11/2010   TSH 0.99 11/11/2010      Assessment & Plan:  HYPOTHYROIDISM Cont Rx  TMJ SYNDROME Still w/problems  ANXIETY Stress w/dtr  Paresthesia She can try Metanx x 1-2 mo  SKIN CANCER, HX OF Yearly check ups    Wellness  We discussed age appropriate health related  issues, including available/recomended screening tests and vaccinations. We discussed a need for adhering to healthy diet and exercise. Labs/EKG were reviewed/ordered. All questions were answered.

## 2010-11-15 NOTE — Assessment & Plan Note (Signed)
[facility]                           PRIMARY CARE OFFICE NOTE   [name], [name]                        MRN:          [id number]  DATE:08/14/2006                            DOB:          12-16-1955    August 20, 2006   The patient is a 55 year old female who presents for wellness  examination.   PAST MEDICAL HISTORY:  As per April 19, 2003, note.   FAMILY HISTORY:  As per April 19, 2003, note.   SOCIAL HISTORY:  As per April 19, 2003, note.   ALLERGIES:  PENICILLIN, IODINE, CODEINE, ERYTHROMYCIN.   MEDICATIONS:  Reviewed.   REVIEW OF SYSTEMS:  Occasional problems with gas, abdominal discomfort  (had substantial work-up prior).  Dry skin on hands.  Right arm, hands,  ulnar distribution weakness and tingling.  The rest of the 18-point  review of systems is negative.   PHYSICAL EXAMINATION:  GENERAL APPEARANCE:  She is in no acute distress.  Looks well.  VITAL SIGNS:  Blood pressure 101/65, pulse 69, temperature 99.8, weight  125 pounds.  HEENT:  Moist mucosa.  Has braces.  NECK:  Supple, no cardiomegaly or bruit.  LUNGS:  Clear.  No wheezing or rales.  CARDIOVASCULAR:  S1 and S2, no murmur, no gallop.  ABDOMEN:  Soft, nontender, no organomegaly or masses felt.  EXTREMITIES:  Lower extremities without edema.  NEUROLOGIC:  She is alert and oriented, cooperative, denies being  depressed.  Upper extremities without muscle atrophy.  Deep tendon  reflexes and muscle strength normal.  SKIN:  Dry skin with eczema on hands.   LABORATORY DATA:  August 12, 2006, CBC normal, CMET normal.  Cholesterol 195.  TSH 2.24.  Urinalysis normal.   ASSESSMENT/PLAN:  1. Normal wellness examination.  Age/health related issues discussed.      Healthy lifestyle discussed.  Obtain vitamin D level.  2. Hypothyroidism, seeing Dr. Talmage Nap for management.  3. 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 Epipen, make Ziploc bags with Sudafed 90 mg,      Benadryl take 50 mg and  prednisone to take 40 mg if severe      reaction.  4. Gas problems: empiric Flagyl 500 p.o. t.i.d. for 10 days followed      by a month or two of probiotic.  5. Eczema.  Triamcinolone ointment 0.1% to use b.i.d.  6. 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  Advised to see      Dr. Teressa Caldwell for consultation.  7. Regular gynecological care/mammogram with Dr. Chevis Caldwell.     Maria Quint. Plotnikov, MD  Electronically Signed    AVP/MedQ  DD: 08/20/2006  DT: 08/20/2006  Job #: [id number]   cc:   [name], M.D.  [name], M.D.  [name], M.D.

## 2010-11-15 NOTE — Assessment & Plan Note (Signed)
[facility]                                   ON-CALL NOTE   SHARMAINE, BAIN                        MRN:          [id number]  DATE:03/03/2006                            DOB:          [birth year]    PHONE NUMBER:  [phone]   Patient of Dr. Posey Rea.  A phone call came in at 6:30 p.m. on September 4.  Ms. Hoback has had some fevers and chills just really today.  She has had a  fairly severe headache and some neck stiffness.  Said she is most considered  because she had a tic on her about 10 days ago which was not imbedded but  she knows a person who died of [place] Spotted Fever earlier this  summer.  She has not taken any medication for this.  She has a slight cough  but nothing major and no real breathing problems.  No urinary symptoms.  No  skin rash.   PLAN:  I told her that it was doubtful that she had [facility] Spotted  Fever but that I certainly could not exclude it over the phone.  I told her  to try two Extra Strength Tylenol since her stomach was not good with  nonsteroidals.  If she does not feel a whole lot better, to have her husband  bring her to the emergency room for further evaluation.  I told her that  treatment with doxycycline may be appropriate if she is not feeling better,  even if the RMSF possibility is fairly small.                                   Karie Schwalbe, MD   RIL/MedQ  DD:  03/03/2006  DT:  03/04/2006  Job #:  [id number]   cc:   Georgina Quint. Plotnikov, MD

## 2010-11-17 ENCOUNTER — Encounter: Payer: Self-pay | Admitting: Internal Medicine

## 2010-11-17 DIAGNOSIS — R202 Paresthesia of skin: Secondary | ICD-10-CM | POA: Insufficient documentation

## 2010-11-17 DIAGNOSIS — Z0000 Encounter for general adult medical examination without abnormal findings: Secondary | ICD-10-CM | POA: Insufficient documentation

## 2010-11-17 NOTE — Assessment & Plan Note (Signed)
Stress w/dtr

## 2010-11-17 NOTE — Assessment & Plan Note (Signed)
Still w/problems

## 2010-11-17 NOTE — Assessment & Plan Note (Signed)
She can try Metanx x 1-2 mo

## 2010-11-17 NOTE — Assessment & Plan Note (Signed)
Yearly check ups 

## 2010-12-09 ENCOUNTER — Ambulatory Visit (INDEPENDENT_AMBULATORY_CARE_PROVIDER_SITE_OTHER): Payer: BLUE CROSS/BLUE SHIELD | Admitting: Family Medicine

## 2010-12-09 ENCOUNTER — Encounter: Payer: Self-pay | Admitting: Family Medicine

## 2010-12-09 VITALS — BP 105/70 | HR 71

## 2010-12-09 DIAGNOSIS — M76899 Other specified enthesopathies of unspecified lower limb, excluding foot: Secondary | ICD-10-CM

## 2010-12-09 DIAGNOSIS — M533 Sacrococcygeal disorders, not elsewhere classified: Secondary | ICD-10-CM

## 2010-12-09 DIAGNOSIS — M999 Biomechanical lesion, unspecified: Secondary | ICD-10-CM

## 2010-12-09 DIAGNOSIS — M6580 Other synovitis and tenosynovitis, unspecified site: Secondary | ICD-10-CM

## 2010-12-09 DIAGNOSIS — M722 Plantar fascial fibromatosis: Secondary | ICD-10-CM

## 2010-12-10 DIAGNOSIS — M533 Sacrococcygeal disorders, not elsewhere classified: Secondary | ICD-10-CM | POA: Insufficient documentation

## 2010-12-10 NOTE — Progress Notes (Signed)
  Subjective:    Patient ID: [name], female    DOB: April 29, 1956, 55 y.o.   MRN: [id number]  HPI 1)  F/u right ischial bursitis---about 50% better. Has been using gel pad when sitting. Doing exercises and not stretching as directed.  2) F/u left plantar fasciitis with nodule. Has been wearing arch strap and about 35% better.  3) f/u left SI joint pain--doing PT and is improving slowly.  She is feeling somewhat frustrated as it is taking so long for things to improve.    Review of Systems Pertinent review of systems: negative for fever or unusual weight change.     Objective:   Physical Exam GEN WD WN NAD Bilateral hips: HIP FROM and strength 5/5 flexion and extension and lateral raise. Right hip ttp ischial prominence. Left hip mild ttp SI notch.  Left foot nodule incenter of plantr fascia mildly ttp  MSK [place]: Somewhat improved amount of fluid in the pseudo bursa at the right ischial prominence. No increased flow seen on doppler. Small calcification identified within the fluid which is likely old avulsion fragment. There is   calcification of the origin of the tendon at sight of origin on the tuberosity.    Assessment & Plan:  1. Ischial bursitis--improving.Continue strengthening without stretching, continue gel pad and NTG patches 2) PF--continue arch strap==we added an extra small MT pad and exchanged the scaphoid pad for a smaller one on her orthotic to see if this improved things. 3) continue PT for left SI joint. rtc 40m

## 2011-01-20 ENCOUNTER — Encounter: Payer: Self-pay | Admitting: Sports Medicine

## 2011-01-20 ENCOUNTER — Ambulatory Visit (INDEPENDENT_AMBULATORY_CARE_PROVIDER_SITE_OTHER): Payer: BLUE CROSS/BLUE SHIELD | Admitting: Sports Medicine

## 2011-01-20 VITALS — BP 118/72 | HR 69

## 2011-01-20 DIAGNOSIS — M76891 Other specified enthesopathies of right lower limb, excluding foot: Secondary | ICD-10-CM | POA: Insufficient documentation

## 2011-01-20 DIAGNOSIS — M76899 Other specified enthesopathies of unspecified lower limb, excluding foot: Secondary | ICD-10-CM

## 2011-01-20 DIAGNOSIS — M722 Plantar fascial fibromatosis: Secondary | ICD-10-CM

## 2011-01-20 DIAGNOSIS — M6580 Other synovitis and tenosynovitis, unspecified site: Secondary | ICD-10-CM

## 2011-01-20 NOTE — Progress Notes (Signed)
  Subjective:    Patient ID: [name], female    DOB: [birth year], 55 y.o.   MRN: [id number]  HPI  55 y/o female is here for follow up.  1. Left plantar fasciitis.  Pain is improved 20% with use of hard orthotics and support strap.    2. Hamstring tendonitis. Pain is improved 10% with use of NTG patch.  She does notice the difference when she forgets to put on her patch.  She continues to have a mild nagging aching in the belly of the muscle with activity.  3. Left SI pain. Symptoms essentially resolved.  She is now on a home therapy regimen. Worked on this in PT. Review of Systems     Objective:   Physical Exam   Left foot: Palpable thickening in the midfoot over the plantar fascia.  Palpation over this area reproduces her pain. No pain at the medial insertion of plantar fascia into calcaneus.  Right thigh/buttocks: Mild tenderness over the mid muscle bellies Hamstring strength is normal with inversion, eversion, and neutral positioning of the foot. Mild tenderness near the origin of the hamstring tendons. ROM normal at the hip      Assessment & Plan:

## 2011-01-20 NOTE — Assessment & Plan Note (Signed)
Start walking on the treadmill for 10 minutes, adding five minutes each week. Start daily heel raises on a book. Continue with support strap.

## 2011-01-20 NOTE — Assessment & Plan Note (Addendum)
Continue with NTG patch.  Start using compression sleeve for support.  We will plan on reck in month and at that point repeat US scan of HS area and also of PF nodule  Keep up HS exercises  Xtraining is OK

## 2011-01-20 NOTE — Patient Instructions (Signed)
1. Walk on treadmill for 10 minutes, level only.  Increase by 5 minutes per week.  2. Continue with hamstring curls.  3. Continue straps for plantar fascia support.  4. Continue nitroglycerin for hamstring.  5. Do heel raises on a book using both feet.  One set of 15 with knee straight and bent on week one.  Increase to 2 sets week 2 and then 3 sets on weeks 3&4.  6. Use compression sleeve for the hamstring.

## 2011-02-25 ENCOUNTER — Encounter: Payer: Self-pay | Admitting: Sports Medicine

## 2011-02-25 ENCOUNTER — Ambulatory Visit (INDEPENDENT_AMBULATORY_CARE_PROVIDER_SITE_OTHER): Payer: BLUE CROSS/BLUE SHIELD | Admitting: Sports Medicine

## 2011-02-25 VITALS — BP 117/73 | HR 62 | Ht 64.0 in | Wt 130.0 lb

## 2011-02-25 DIAGNOSIS — M6580 Other synovitis and tenosynovitis, unspecified site: Secondary | ICD-10-CM

## 2011-02-25 DIAGNOSIS — M722 Plantar fascial fibromatosis: Secondary | ICD-10-CM

## 2011-02-25 DIAGNOSIS — M76899 Other specified enthesopathies of unspecified lower limb, excluding foot: Secondary | ICD-10-CM

## 2011-02-25 NOTE — Assessment & Plan Note (Signed)
Good progress to date  Keep up exercises Keep up compression sleeve  Reck in 3 mos

## 2011-02-25 NOTE — Assessment & Plan Note (Signed)
Improving Use arch straps - very helpful Firm orthotics Cont exercises and stretches

## 2011-02-25 NOTE — Patient Instructions (Signed)
Continue using nitroglycerin patch for the next 3 months  Continue exercises for Hamstring strength and hip rotators, continue using the thigh sleeve when you are doing significant activity, and continue with arch straps and orthotics with padding.  Return for follow up in 3 months

## 2011-02-25 NOTE — Progress Notes (Signed)
  Subjective:    Patient ID: [name], female    DOB: [birth year], 55 y.o.   MRN: [id number]  HPI  Pt presents to clinic for f/u of Rt HS tendonitis which she feels has improved moderately w activity and slightly w sitting.  Using thigh sleeve which is very helpful. Able to walk on treadmill with 3% grade without significant pain. Continues to use NTG patch.  Note on a few days with no NTG SHE HAD return of increased pain.  Lt PF - still has morning pain and stiffness, better with stretching.  Using rigid orthotics with shoe insole over top for work, and wearing arch strap.  Has worked up to calf raises 3 sets of 15 with knees straight and bent.      Review of Systems     Objective:   Physical Exam   NAD Rt foot- excellent great toe motion PF visualized, no obvious swelling Some tenderness at insertion of rt PF at heel  Lt foot- lt toe tight in extension, good flexion Tenderness to palpation at insertion of PF at heel Nodule mid arch, palpable but not swollen  Slight discomfort at ischial tub on rt with straight leg raise Neg straight leg raise on lt Lt leg- good HS strength with foot neutral, inverted and everted Rt leg- pain at ischial tuberosity more with foot inversion than eversion        Assessment & Plan:

## 2011-09-18 ENCOUNTER — Ambulatory Visit (INDEPENDENT_AMBULATORY_CARE_PROVIDER_SITE_OTHER): Payer: BLUE CROSS/BLUE SHIELD | Admitting: Sports Medicine

## 2011-09-18 ENCOUNTER — Encounter: Payer: Self-pay | Admitting: Sports Medicine

## 2011-09-18 VITALS — BP 119/70 | HR 67

## 2011-09-18 DIAGNOSIS — M76899 Other specified enthesopathies of unspecified lower limb, excluding foot: Secondary | ICD-10-CM

## 2011-09-18 DIAGNOSIS — G8929 Other chronic pain: Secondary | ICD-10-CM

## 2011-09-18 DIAGNOSIS — M533 Sacrococcygeal disorders, not elsewhere classified: Secondary | ICD-10-CM

## 2011-09-18 DIAGNOSIS — M6580 Other synovitis and tenosynovitis, unspecified site: Secondary | ICD-10-CM

## 2011-09-18 MED ORDER — NITROGLYCERIN 0.2 MG/HR TD PT24: MEDICATED_PATCH | TRANSDERMAL | Status: DC

## 2011-09-18 NOTE — Progress Notes (Signed)
Subjective:    Patient ID: [name], female    DOB: 1955-09-26, 56 y.o.   MRN: [id number]  HPI   Ms. Glauser is here today for followup on her left chronic SI joint pain . He has been doing physical therapy for the last 6 weeks . The therapy has been working on strengthening her core and lysing her left SI joint . She states that she a little better regarding the left  si joint pain, however the pain is affecting the amount of physical activity and a speed of her walking.  She is also following up on her right proximal chronic hamstring tear .she has been having the proximal hamstring pain for about a year . She does not recall a particular injury .she has been treated with nitroglycerin patch for several months .she had a great in nature respond to the nitroglycerin patch to the point that she stop using them and the pain returned with this in severity at the beginning . She restarted the nitroglycerin patch of name Sherryle Lis is, one fourth but without much improvement. She has been able to walk again and the treadmill but when she increased her speed she can feel the pain in the proximal hamstring. No numbness no tingling.  Patient Active Problem List  Diagnoses  . HYPOTHYROIDISM  . ANXIETY  . ALLERGIC RHINITIS  . ASTHMA  . TMJ SYNDROME  . GERD  . PEPTIC ULCER DISEASE  . OSTEOPENIA  . SKIN CANCER, HX OF  . FIBROCYSTIC BREAST DISEASE, HX OF  . Plantar fascia syndrome  . Hamstring tendonitis at origin  . Well adult exam  . Paresthesia  . Sacroiliac dysfunction  . Plantar fasciitis  . Hamstring tendonitis at origin   Current Outpatient Prescriptions on File Prior to Visit  Medication Sig Dispense Refill  . b complex vitamins tablet Take 1 tablet by mouth daily.        . Calcium Carbonate (CALCIUM 600 PO) Take by mouth daily.        . cetirizine (ZYRTEC) 10 MG tablet Take 10 mg by mouth daily.        . Cholecalciferol (VITAMIN D3) 1000 UNITS CAPS Take by mouth daily.        .  Coenzyme Q10 (COQ10) 100 MG CAPS Take by mouth daily.        . Diclofenac Sodium 1.5 % SOLN Apply 40 drops to affected area of pain 3-4 times daily as needed for pain  1 Bottle  1  . EPIPEN 2-PAK 0.3 MG/0.3ML DEVI       . ESTRADIOL TD Place 1.25 % onto the skin daily.        . fluticasone (FLONASE) 50 MCG/ACT nasal spray       . l-methylfolate-B6-B12 (METANX) 3-35-2 MG TABS Take 1 tablet by mouth daily.  60 tablet  11  . levothyroxine (SYNTHROID, LEVOTHROID) 25 MCG tablet Take 25 mcg by mouth as directed. 25 mcg 4 days per week, 50 mcg 3 days per week      . liothyronine (CYTOMEL) 5 MCG tablet Take 5 mcg by mouth 3 (three) times daily.       . NON FORMULARY Progesterone topical cream  3mg /ml. Place 1 ml on designated areas daily.       . Omega-3 Fatty Acids (FISH OIL) 1000 MG CAPS Take by mouth daily.         Allergies  Allergen Reactions  . Codeine   . Demerol Nausea And Vomiting  .  Penicillins      Review of Systems  Constitutional: Negative for fever, chills, diaphoresis and fatigue.  Musculoskeletal: Positive for back pain. Negative for joint swelling, arthralgias and gait problem.  Neurological: Negative for weakness and numbness.       Objective:   Physical Exam  Constitutional: She is oriented to person, place, and time. She appears well-developed and well-nourished.       BP 119/70  Pulse 67   Pulmonary/Chest: Effort normal.  Musculoskeletal:       Right right hip with intact skin. Full range of motion. There is tenderness to palpation in the ischial tuberosity in the medial side at the attachment of the hamstring. There is pain in the medial aspect of the ischial tuberosity with persistent hip flexion at 120, 90, 45. There are no defects in the hamstring muscle. Neurovascularly intact  Left low back with intact skin. A full range of motion with pain with extension. Returns for patient in the left SI joint. Pearlean Brownie test is positive reproducing left SI joint  pain. Strength 5 over 5 for he knees and ankle flexion and extension Neurovascularly intact. There is a leg length discrepancy, left leg is 1.5 cm shorter than the right.  Neurological: She is alert and oriented to person, place, and time.  Skin: Skin is warm. No rash noted. No erythema.  Psychiatric: She has a normal mood and affect. Her behavior is normal.   MSK U/S: Ischial tuberosity with fluid in hamstring insertion, calcification. No tera in the hamstring tendon.      Assessment & Plan:   1. Hamstring tendonitis at origin   2. Chronic left SI joint pain    Nitro patch : increase dose to 1/2 patch every 24 hrs Heel lift in left foot Cont. PT F/U in 1 month

## 2011-09-18 NOTE — Patient Instructions (Signed)
Nitro patch : increase dose to 1/2 patch every 24 hrs Heel lift in left foot Cont. PT F/U in 1 month

## 2011-10-27 ENCOUNTER — Ambulatory Visit (INDEPENDENT_AMBULATORY_CARE_PROVIDER_SITE_OTHER): Payer: BLUE CROSS/BLUE SHIELD | Admitting: Sports Medicine

## 2011-10-27 VITALS — BP 104/60

## 2011-10-27 DIAGNOSIS — M76899 Other specified enthesopathies of unspecified lower limb, excluding foot: Secondary | ICD-10-CM

## 2011-10-27 DIAGNOSIS — M533 Sacrococcygeal disorders, not elsewhere classified: Secondary | ICD-10-CM

## 2011-10-27 DIAGNOSIS — M6580 Other synovitis and tenosynovitis, unspecified site: Secondary | ICD-10-CM

## 2011-10-27 MED ORDER — NITROGLYCERIN 0.2 MG/HR TD PT24: MEDICATED_PATCH | TRANSDERMAL | Status: DC

## 2011-10-27 NOTE — Patient Instructions (Addendum)
Start weaning off of nitroglycerin-  Do not use the patch every 3rd day for 1 week Then use every other day for 1 week  If you have not problems with these decreases- ok to stop nitroglycerin  Do standing hamstring balance stretch Rapid standing hip flexion Do pretzel stretch  Continue using thigh compression for activity  Continue previously suggested exercises  Please follow up in 6 weeks  Thank you for seeing [place] today!

## 2011-10-27 NOTE — Progress Notes (Signed)
  Subjective:    Patient ID: [name], female    DOB: 01-26-1956, 56 y.o.   MRN: [id number]  HPI  Pt presents to clinic for f/u of L SI joint dysfunction and rt HS pain which are slightly improved. She increased to 1/2 NTG patch on rt HS insertion 1 month ago, has been using NTG for about 1 year. Has not noticed significant improvement since increasing to 1/2 patch. Does elliptical and treadmill at the gym, going backwards on elliptical does not cause as much pain as treadmill. Uses bodyhelix thigh sleeve regularly.  Has been doing PT 1-2 x/ week for SI joint including ultrasound, kenisio taping for SI joint which have been helpful.   [name] is able to walk 30 mins at 2.7 MPH at a 5% grade This does not lead her to severe pain  Gets more SIJ pain at night on Left and that interferes with sleep PT and exercises will often relieve this but tends to recur     Review of Systems     Objective:   Physical Exam NAD  Lt SI joint tight to compression Pretzel stretch still tight on the lt Gentle manipulation improves pretzel stretch on lt by 15 deg Straight leg raise neg on lt, tight at rt HS insertion at 70 deg  Hip abduction strength good bilat Lt HS strength good, but cramps easily Rt HS tendon feel intact, no gaps in muscle  Gets to 80 deg with rapid hip flexion bilat, by the 3rd repetition on rt decreases to 70 deg     Assessment & Plan:

## 2011-10-27 NOTE — Assessment & Plan Note (Signed)
Left SIJ still locks  Restart regular pretzel stretches and hip rotation  If she does not improve and sleeps through the night poorly we will go ahead with SIJ injection

## 2011-10-27 NOTE — Assessment & Plan Note (Signed)
See instructions   We will add some new HS exercises as per [facility] protocol  Re ck in 6 wks  Functional now but would like to be able to exercise harder

## 2011-12-15 ENCOUNTER — Ambulatory Visit: Payer: BLUE CROSS/BLUE SHIELD | Admitting: Sports Medicine

## 2012-06-10 ENCOUNTER — Encounter: Payer: Self-pay | Admitting: Internal Medicine

## 2012-07-30 ENCOUNTER — Emergency Department (HOSPITAL_COMMUNITY)
Admission: EM | Admit: 2012-07-30 | Discharge: 2012-07-30 | Disposition: A | Discharge status: Home / Self Care | Payer: Federal, State, Local not specified - PPO | Attending: Emergency Medicine | Admitting: Emergency Medicine

## 2012-07-30 ENCOUNTER — Encounter (HOSPITAL_COMMUNITY): Payer: Self-pay | Admitting: Cardiology

## 2012-07-30 DIAGNOSIS — W268XXA Contact with other sharp object(s), not elsewhere classified, initial encounter: Secondary | ICD-10-CM | POA: Insufficient documentation

## 2012-07-30 DIAGNOSIS — W010XXA Fall on same level from slipping, tripping and stumbling without subsequent striking against object, initial encounter: Secondary | ICD-10-CM | POA: Insufficient documentation

## 2012-07-30 DIAGNOSIS — S61409A Unspecified open wound of unspecified hand, initial encounter: Secondary | ICD-10-CM | POA: Insufficient documentation

## 2012-07-30 DIAGNOSIS — Z8711 Personal history of peptic ulcer disease: Secondary | ICD-10-CM | POA: Insufficient documentation

## 2012-07-30 DIAGNOSIS — S61412A Laceration without foreign body of left hand, initial encounter: Secondary | ICD-10-CM

## 2012-07-30 DIAGNOSIS — Z85828 Personal history of other malignant neoplasm of skin: Secondary | ICD-10-CM | POA: Insufficient documentation

## 2012-07-30 DIAGNOSIS — E039 Hypothyroidism, unspecified: Secondary | ICD-10-CM | POA: Insufficient documentation

## 2012-07-30 DIAGNOSIS — Z8719 Personal history of other diseases of the digestive system: Secondary | ICD-10-CM | POA: Insufficient documentation

## 2012-07-30 DIAGNOSIS — Y9301 Activity, walking, marching and hiking: Secondary | ICD-10-CM | POA: Insufficient documentation

## 2012-07-30 DIAGNOSIS — M899 Disorder of bone, unspecified: Secondary | ICD-10-CM | POA: Insufficient documentation

## 2012-07-30 DIAGNOSIS — J45909 Unspecified asthma, uncomplicated: Secondary | ICD-10-CM | POA: Insufficient documentation

## 2012-07-30 DIAGNOSIS — Y929 Unspecified place or not applicable: Secondary | ICD-10-CM | POA: Insufficient documentation

## 2012-07-30 DIAGNOSIS — Z8742 Personal history of other diseases of the female genital tract: Secondary | ICD-10-CM | POA: Insufficient documentation

## 2012-07-30 DIAGNOSIS — Z8709 Personal history of other diseases of the respiratory system: Secondary | ICD-10-CM | POA: Insufficient documentation

## 2012-07-30 DIAGNOSIS — IMO0002 Reserved for concepts with insufficient information to code with codable children: Secondary | ICD-10-CM | POA: Insufficient documentation

## 2012-07-30 DIAGNOSIS — Z79899 Other long term (current) drug therapy: Secondary | ICD-10-CM | POA: Insufficient documentation

## 2012-07-30 DIAGNOSIS — Z8659 Personal history of other mental and behavioral disorders: Secondary | ICD-10-CM | POA: Insufficient documentation

## 2012-07-30 DIAGNOSIS — M949 Disorder of cartilage, unspecified: Secondary | ICD-10-CM | POA: Insufficient documentation

## 2012-07-30 NOTE — ED Provider Notes (Signed)
History     CSN: [id number]  Arrival date & time 07/30/12  1352   First MD Initiated Contact with Patient 07/30/12 1440      Chief Complaint  Patient presents with  . Hand Injury    (Consider location/radiation/quality/duration/timing/severity/associated sxs/prior treatment) HPI [name] is a 57 y.o. female who presents with complaint of a hand laceration. Pt states she fell with a coffee pot, which broke and cut her on the left hand. States laceration to the tendon, worried it may have cut her tendon to the 5th finger. Pt states she is having some decreased sensation to the finger. No weakness. No other lacerations or injuries. Tetanus up to date.    Past Medical History  Diagnosis Date  . HYPOTHYROIDISM 11/12/2007    Dr Talmage Nap  . ANXIETY 03/02/2007  . ALLERGIC RHINITIS 03/02/2007  . ASTHMA 11/12/2007    Dr [name]  . TMJ SYNDROME 11/12/2007  . GERD 03/02/2007  . PEPTIC ULCER DISEASE 03/02/2007  . OSTEOPENIA 11/12/2007  . FIBROCYSTIC BREAST DISEASE, HX OF 03/02/2007  . Cancer     skin, hx of    Past Surgical History  Procedure Date  . Mandib advancement forward 2009    Family History  Problem Relation Age of Onset  . Asthma Neg Hx   . Hypertension Other   . Hypertension Father     History  Substance Use Topics  . Smoking status: Never Smoker   . Smokeless tobacco: Never Used     Comment: Married, 1 child  . Alcohol Use: No    OB History    Grav Para Term Preterm Abortions TAB SAB Ect Mult Living                  Review of Systems  Constitutional: Negative for fever and chills.  Respiratory: Negative.   Cardiovascular: Negative.   Skin: Positive for wound.  Neurological: Negative for weakness and numbness.    Allergies  Codeine; Demerol; Other; and Penicillins  Home Medications   Current Outpatient Rx  Name  Route  Sig  Dispense  Refill  . VITAMIN C PO   Oral   Take 1 tablet by mouth daily.         . B COMPLEX PO TABS   Oral   Take 1 tablet  by mouth daily.           Marland Kitchen CALCIUM 600 PO   Oral   Take by mouth daily.           Marland Kitchen CETIRIZINE HCL 10 MG PO TABS   Oral   Take 5 mg by mouth 3 (three) times a week.          Marland Kitchen VITAMIN D3 1000 UNITS PO CAPS   Oral   Take by mouth daily.           Marland Kitchen ESTRADIOL TD   Transdermal   Place 1.25 % onto the skin daily.          Marland Kitchen FLUTICASONE PROPIONATE 50 MCG/ACT NA SUSP   Nasal   Place 2 sprays into the nose daily as needed. For allergies         . L-METHYLFOLATE-B6-B12 3-35-2 MG PO TABS   Oral   Take 1 tablet by mouth daily.         Marland Kitchen LEVOTHYROXINE SODIUM 25 MCG PO TABS   Oral   Take 25-50 mcg by mouth as directed. 25 mcg 4 days per week, 50 mcg 3  days per week         . LIOTHYRONINE SODIUM 5 MCG PO TABS   Oral   Take 5-10 mcg by mouth See admin instructions. Takes 5 mcg in the morning and 10 mcg in the afternonooon         . NON FORMULARY   Transdermal   Place 3 mLs onto the skin daily. Progesterone topical cream  3mg /ml. Place 1 ml on designated areas daily.         Marland Kitchen VITAMIN E PO   Oral   Take 1 tablet by mouth daily.         Marland Kitchen EPIPEN 2-PAK 0.3 MG/0.3ML IJ DEVI   Intramuscular   Inject 0.3 mg into the muscle once.            BP 129/68  Pulse 73  Temp 97.5 F (36.4 C) (Oral)  SpO2 100%  Physical Exam  Nursing note and vitals reviewed. Constitutional: She is oriented to person, place, and time. She appears well-developed and well-nourished. No distress.  HENT:  Head: Normocephalic.  Eyes: Conjunctivae normal are normal.  Musculoskeletal:       3cm laceration over left 4th metacarpal. Gaping. Tendon sheath exposed. No definite tendon injury. Full rom of the left 5th finger. Good strength with extension of all joints, and flexion of all joints. Cap refill normal  Neurological: She is alert and oriented to person, place, and time.       Decreased sensation over lateral aspect of the left 5th finger. Normal two point descrimination of the  distal finger.   Skin: Skin is warm and dry.  Psychiatric: She has a normal mood and affect. Her behavior is normal.    ED Course  Procedures (including critical care time)  Pt with laceration to the left 5th finger, no tendon involvement based on exam. Discussed with Dr. Jeraldine Loots who looked at it as well. Tetanus up to date. Will close and follow up with hand.   LACERATION REPAIR Performed by: Lottie Mussel Authorized by: Jaynie Crumble A Consent: Verbal consent obtained. Risks and benefits: risks, benefits and alternatives were discussed Consent given by: patient Patient identity confirmed: provided demographic data Prepped and Draped in normal sterile fashion Wound explored  Laceration Location: left dorsal 5th metacarpal  Laceration Length: 3cm  No Foreign Bodies seen or palpated  Anesthesia: local infiltration  Local anesthetic: lidocaine 2% wo epinephrine  Anesthetic total: 3 ml  Irrigation method: syringe Amount of cleaning: standard  Skin closure: prolene 5.0  Number of sutures: 5  Technique: simple interrupted.   Patient tolerance: Patient tolerated the procedure well with no immediate complications.   1. Laceration of left hand       MDM  Pt with laceration over dorsal 5th metacarpal. No signs of tendon involvement. Full rom and strength of the finger. Good neuro exam. Wound irrigated, closed. Splint applied, ulnar gutter. Will follow up with hand.          Lottie Mussel, PA 07/30/12 1556

## 2012-07-30 NOTE — ED Notes (Signed)
4cm laceration and and 1.5cm laceration on left hand. Bleeding controlled.

## 2012-07-30 NOTE — ED Provider Notes (Signed)
This was a shared encounter.  On my exam this physical therapist who cut her hand on glass was distally neurovascularly intact, with partial visualization of tendon sheath beneath a laceration on the dorsum of the hand above the extensor tendon on the fifth digit.  Given some concern for partial disruption, the patient did have splint placement, after suture repair of the wound, and will follow up with hand surgery  Gerhard Munch, MD 07/30/12 [phone]

## 2012-07-30 NOTE — ED Notes (Signed)
Pt reports she was walking with a coffee pot and tripped. States she has a deep laceration to the left hand and felt like she could see tendon. States she has decreased sensation the the hand.

## 2012-07-30 NOTE — Progress Notes (Signed)
Orthopedic Tech Progress Note Patient Details:  [name] [birth year] [id number]  Ortho Devices Type of Ortho Device: Ace wrap;Ulna gutter splint Ortho Device/Splint Location: (L) UE Ortho Device/Splint Interventions: Application   Jennye Moccasin 07/30/2012, 3:59 PM

## 2012-07-30 NOTE — ED Notes (Signed)
Ortho paged and responded/ advised that they are on their way

## 2012-11-17 ENCOUNTER — Other Ambulatory Visit: Payer: Self-pay | Admitting: Gastroenterology

## 2012-11-17 DIAGNOSIS — R109 Unspecified abdominal pain: Secondary | ICD-10-CM

## 2012-11-19 ENCOUNTER — Ambulatory Visit
Admission: RE | Admit: 2012-11-19 | Discharge: 2012-11-19 | Disposition: A | Discharge status: Home / Self Care | Payer: Federal, State, Local not specified - PPO | Source: Ambulatory Visit | Attending: Gastroenterology | Admitting: Gastroenterology

## 2012-11-19 DIAGNOSIS — R109 Unspecified abdominal pain: Secondary | ICD-10-CM

## 2012-11-19 IMAGING — CT CT ABD-PELV W/ CM
2 of 5 series · 17 of 46 positions shown, 19 images · IV contrast (READICAT/WATER & [ID] OMNI 300)
Comparison: None.

CLINICAL DATA: Left lower quadrant abdominal pain for 1 month,
slight weight loss

CT ABDOMEN AND PELVIS WITH CONTRAST
TECHNIQUE: Multidetector CT imaging of the abdomen and pelvis was
performed following the standard protocol during bolus
administration of intravenous contrast.
Contrast: 100mL OMNIPAQUE IOHEXOL 300 MG/ML  SOLN

[Series 2: abd/pelvis with · axial · 0.81mm/px · z∈[-441,-56]mm · 14 of 86 slices shown, 16 images]
[im 5/86  soft-tissue]
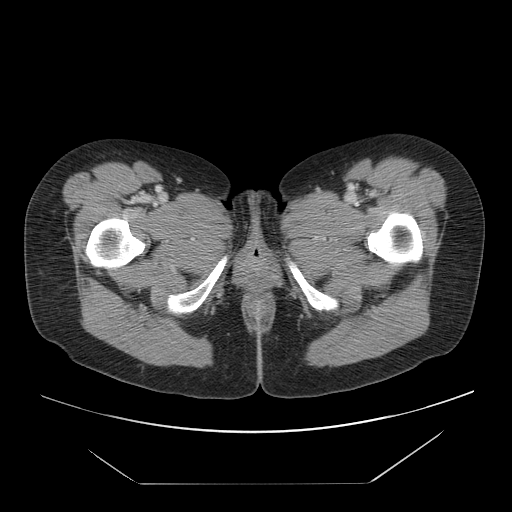
[im 5/86  bone]
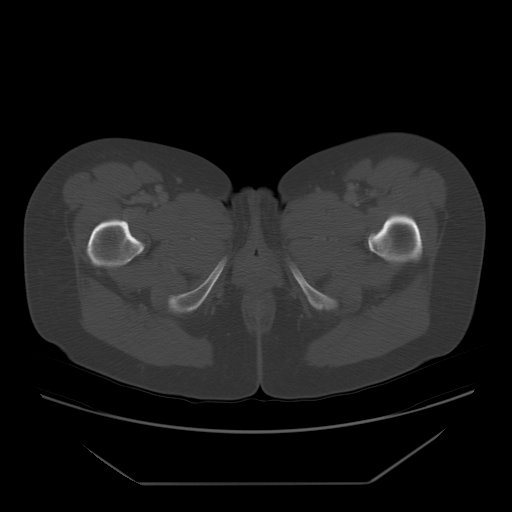
[im 10/86  soft-tissue]
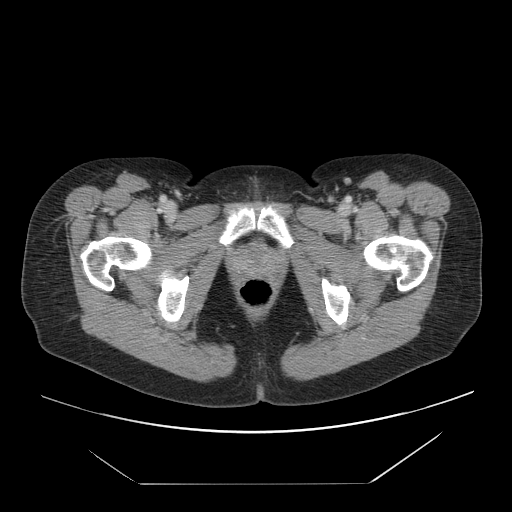
[im 19/86  soft-tissue]
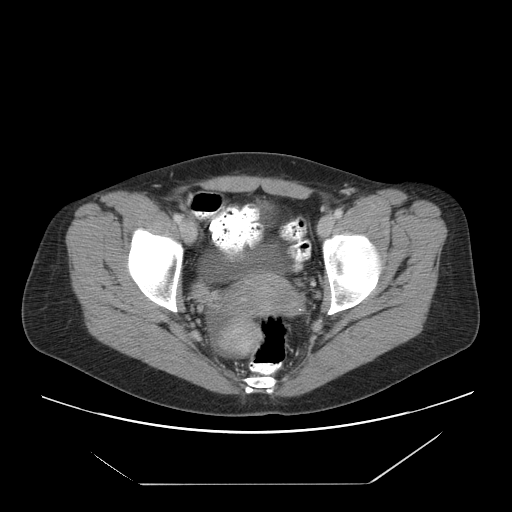
[im 24/86  soft-tissue]
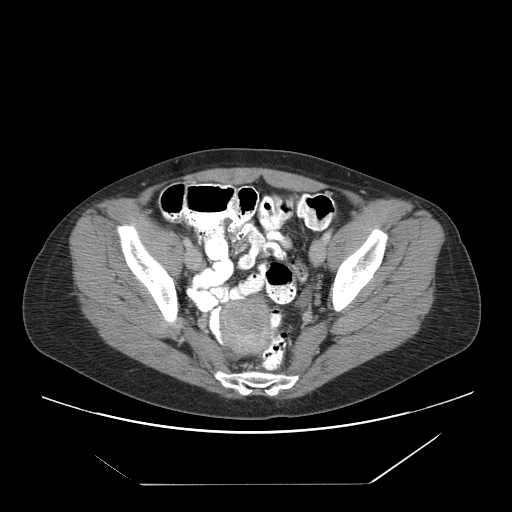
[im 29/86  soft-tissue]
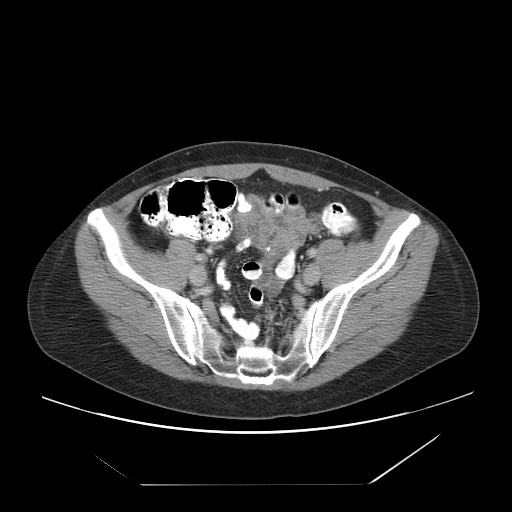
[im 34/86  soft-tissue]
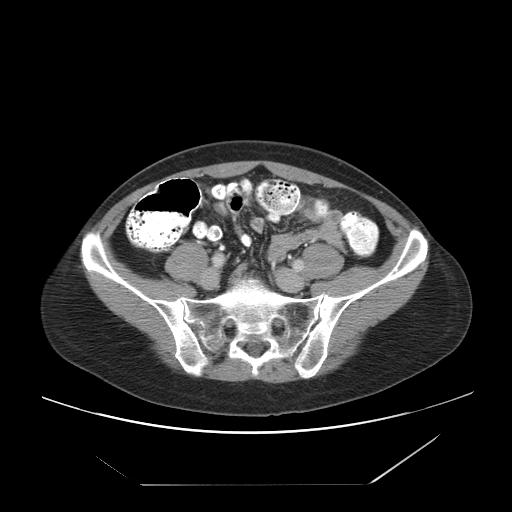
[im 38/86  soft-tissue]
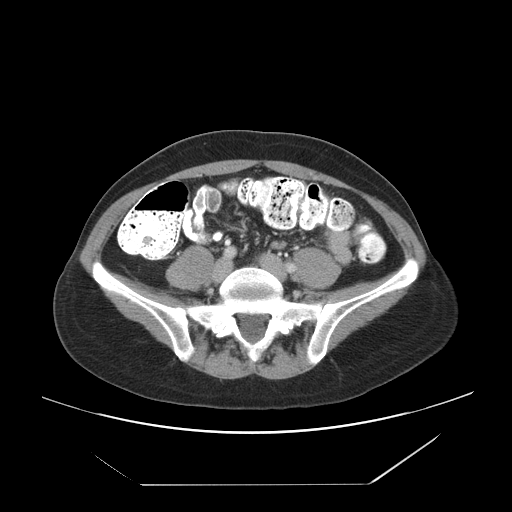
[im 48/86  soft-tissue]
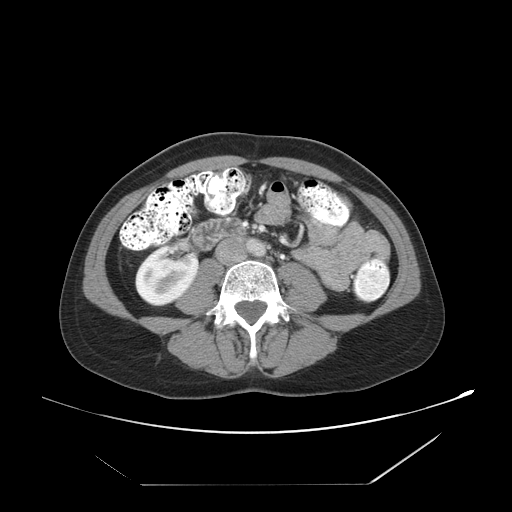
[im 52/86  soft-tissue]
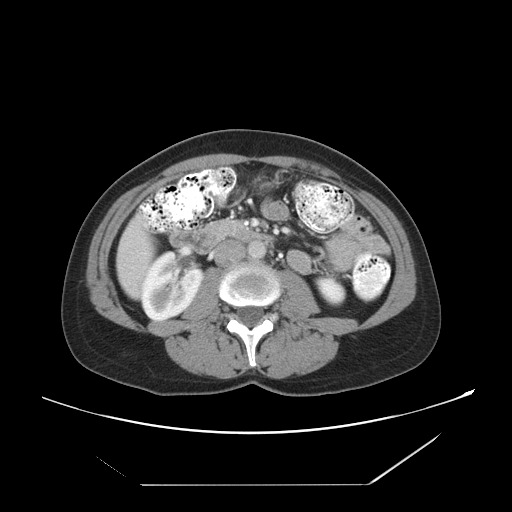
[im 52/86  bone]
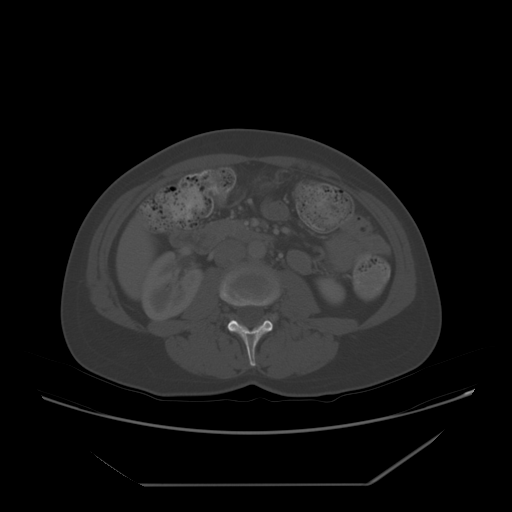
[im 57/86  soft-tissue]
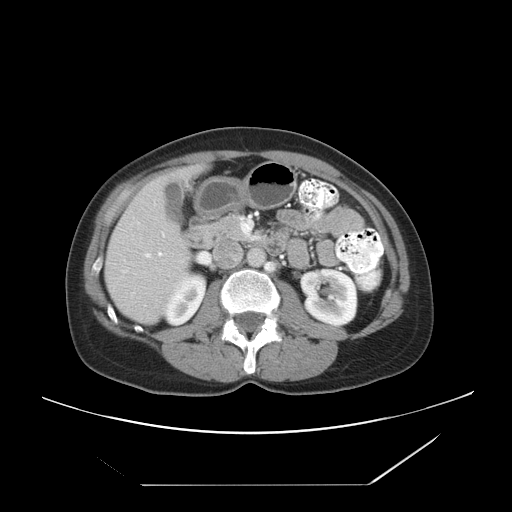
[im 62/86  soft-tissue]
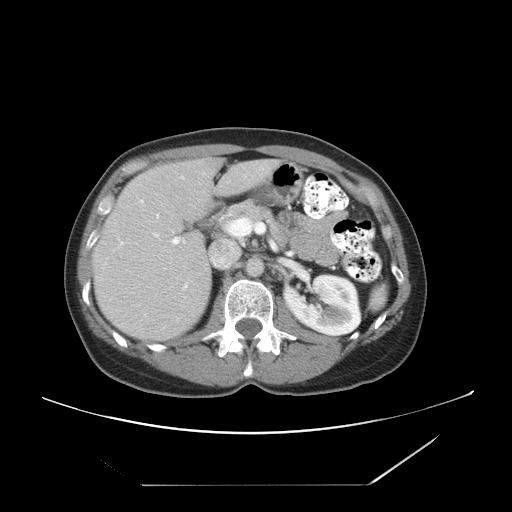
[im 67/86  soft-tissue]
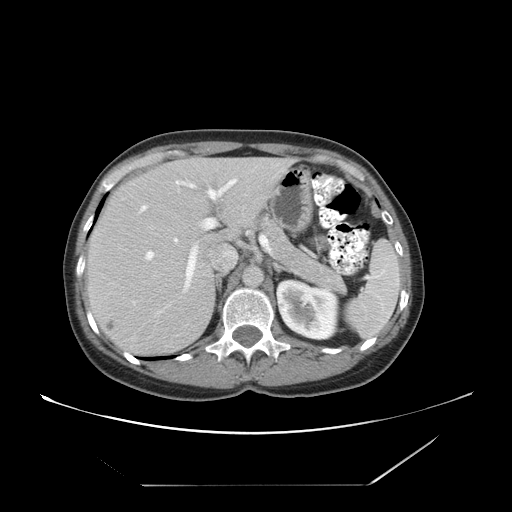
[im 76/86  soft-tissue]
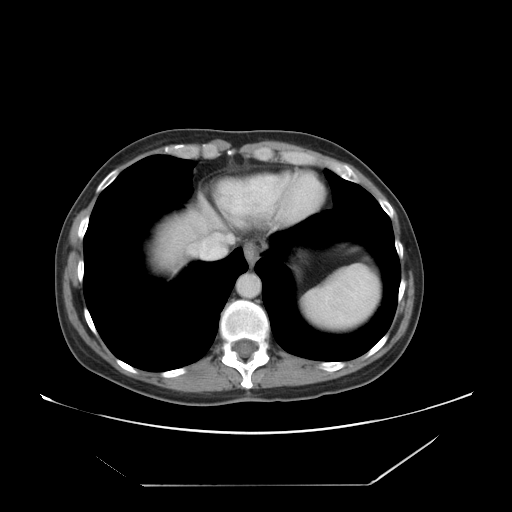
[im 81/86  soft-tissue]
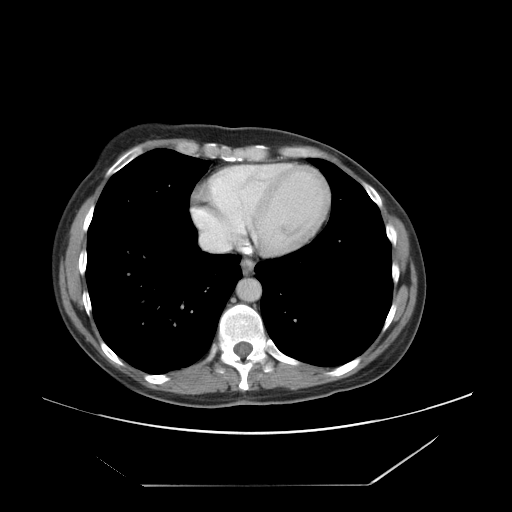

[Series 401: cor · coronal · 0.88mm/px · 3 of 100 slices shown]
[im 34/100  soft-tissue]
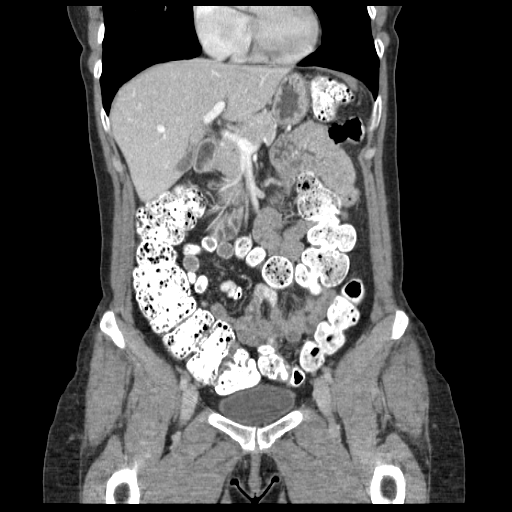
[im 45/100  soft-tissue]
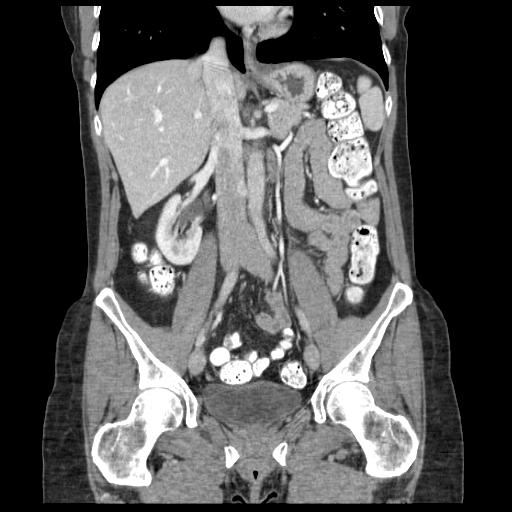
[im 56/100  soft-tissue]
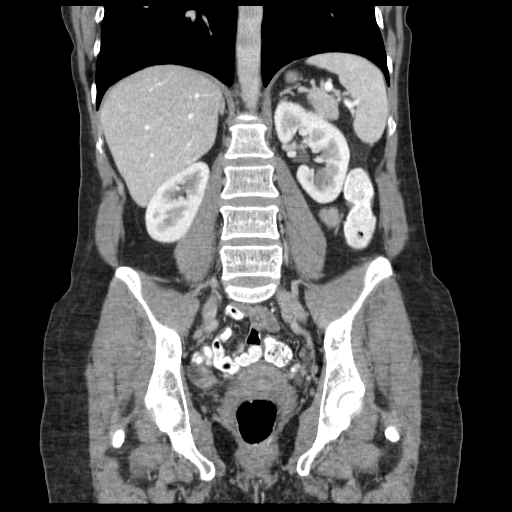

[17 of 46 positions shown; findings below may reference images not displayed]

FINDINGS: The lung bases are clear.  The liver enhances with only a
few small low attenuation structures in the right lobe consistent
with small cysts.  The gallbladder is slightly contracted but no
calcified gallstones are seen.  The pancreas is normal in size and
the pancreatic duct is not dilated.  The adrenal glands and spleen
are unremarkable.  The stomach is not well distended but no gross
abnormality is seen.  The kidneys enhance and only tiny cysts are
present bilaterally.  No solid renal lesion is seen and no calculi
are noted.  No hydronephrosis is noted.  On delayed images, the
pelvocaliceal systems are unremarkable.  The abdominal aorta is
normal in caliber.  No adenopathy is seen.

The uterus is retroverted and an inhomogeneous soft tissue mass
projects posteriorly from the fundus of the uterus most consistent
with a somewhat degenerated uterine fibroid of approximately 5.1 x
5.1 x 4.9 cm.  Clinical correlation is recommended.  There is a
tiny amount of fluid layering posteriorly in the pelvis.  No
adnexal lesion is seen.  The urinary bladder is unremarkable.
There is a moderate amount of feces throughout the entire colon.
The terminal ileum is unremarkable.  The appendix is unremarkable.
IMPRESSION: 1.  There is an inhomogeneous solid mass emanating posteriorly from
the uterine fundus most consistent with a uterine fibroid.
Clinical correlation is recommended.
2.  The appendix and terminal ileum are unremarkable.  A moderate
amount of feces is noted throughout the colon.

## 2012-11-19 MED ORDER — IOHEXOL 300 MG/ML  SOLN
100.0000 mL | Freq: Once | INTRAMUSCULAR | Status: AC | PRN
  Administered 2012-11-19: 100 mL via INTRAVENOUS

## 2013-05-18 ENCOUNTER — Ambulatory Visit (INDEPENDENT_AMBULATORY_CARE_PROVIDER_SITE_OTHER): Payer: Federal, State, Local not specified - PPO | Admitting: Sports Medicine

## 2013-05-18 ENCOUNTER — Encounter: Payer: Self-pay | Admitting: Sports Medicine

## 2013-05-18 VITALS — BP 115/72 | Ht 64.0 in | Wt 134.0 lb

## 2013-05-18 DIAGNOSIS — S76311D Strain of muscle, fascia and tendon of the posterior muscle group at thigh level, right thigh, subsequent encounter: Secondary | ICD-10-CM

## 2013-05-18 DIAGNOSIS — Z5189 Encounter for other specified aftercare: Secondary | ICD-10-CM

## 2013-05-18 DIAGNOSIS — M25559 Pain in unspecified hip: Secondary | ICD-10-CM

## 2013-05-18 DIAGNOSIS — G8929 Other chronic pain: Secondary | ICD-10-CM

## 2013-05-18 DIAGNOSIS — M533 Sacrococcygeal disorders, not elsewhere classified: Secondary | ICD-10-CM

## 2013-05-18 MED ORDER — NITROGLYCERIN 0.2 MG/HR TD PT24: 0.2000 mg | MEDICATED_PATCH | Freq: Every day | TRANSDERMAL | Status: DC

## 2013-05-18 NOTE — Progress Notes (Signed)
CC: Recurrent right proximal hamstring pain, recurrent left SI joint pain HPI: Patient returns for followup today. We had been seeing her for long-standing proximal hamstring strain as well as left SI joint dysfunction. She has been treated in the past with outpatient physical therapy as well as over a year of nitroglycerin. She states that she was doing very well until about 2-3 months ago. She tried to do some pli squats at the gym in August and flared her proximal hamstring again. Since that time she has had a significant pain with sitting and has had waxing and waning symptoms. Her left SI joint also started to act up again when she reinjured the hamstring. Since August, she has really backed off her exercise routine. She has pain with doing the elliptical and has been walking on the treadmill was 0% incline at 3.2 miles per hour. She notes that if she does faster or with incline she has significant pain. She has been stretching as well as using the body helix compression sleeve and the foam roller but has had persistent pain. She is wondering if we can restart the nitroglycerin as this helped her significantly previously. 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  ROS: As above in the HPI. All other systems are stable or negative.  OBJECTIVE: APPEARANCE:  Patient in no acute distress.The patient appeared well nourished and normally developed. HEENT: No scleral icterus. Conjunctiva non-injected Resp: Non labored Skin: No rash MSK:  -  Left SI joint more anterior - Anterior pelvic tilt - Hamstring flexibility 90 on the left, 80 on the right - Left SI joint Pain with pretzel stretch - Strength is 5 out of 5 on hamstrings flexion at neutral, internal and external rotation. - Right hip clunks with hip flexion exercise  ASSESSMENT: #1. Recurrent right proximal hamstring strain #2. Recurrent  acute on chronic left SI joint pain and dysfunction   PLAN: We will restart the patient on nitroglycerin patch for her right hamstring strain. We will refer her to physical therapy with Ellamae Sia for her SI joint dysfunction. Continue body helix compression sleeve. We will see her back in 6 weeks. Hip xrays to evaluate clunking of right hip.

## 2013-05-18 NOTE — Patient Instructions (Signed)
Refer to PT Exercise at gym as tolerated Nitro patch  Nitroglycerin Protocol   Apply 1/4 nitroglycerin patch to affected area daily.  Change position of patch within the affected area every 24 hours.  You may experience a headache during the first 1-2 weeks of using the patch, these should subside.  If you experience headaches after beginning nitroglycerin patch treatment, you may take your preferred over the counter pain reliever.  Another side effect of the nitroglycerin patch is skin irritation or rash related to patch adhesive.  Please notify our office if you develop more severe headaches or rash, and stop the patch.  Tendon healing with nitroglycerin patch may require 12 to 24 weeks depending on the extent of injury.  Men should not use if taking Viagra, Cialis, or Levitra.   Do not use if you have migraines or rosacea.

## 2013-05-23 ENCOUNTER — Ambulatory Visit
Admission: RE | Admit: 2013-05-23 | Discharge: 2013-05-23 | Disposition: A | Discharge status: Home / Self Care | Payer: Federal, State, Local not specified - PPO | Source: Ambulatory Visit | Attending: Family Medicine | Admitting: Family Medicine

## 2013-05-23 DIAGNOSIS — G8929 Other chronic pain: Secondary | ICD-10-CM

## 2013-05-23 IMAGING — CR DG PELVIS 1-2V
1 series · 1 of 1 positions shown · non-contrast
Comparison: CT abdomen and pelvis [DATE].

CLINICAL DATA: Chronic right-sided hip pain. Clicking and popping
sounds from the right hip.

EXAM:
PELVIS - 1-2 VIEW

[view not recorded]
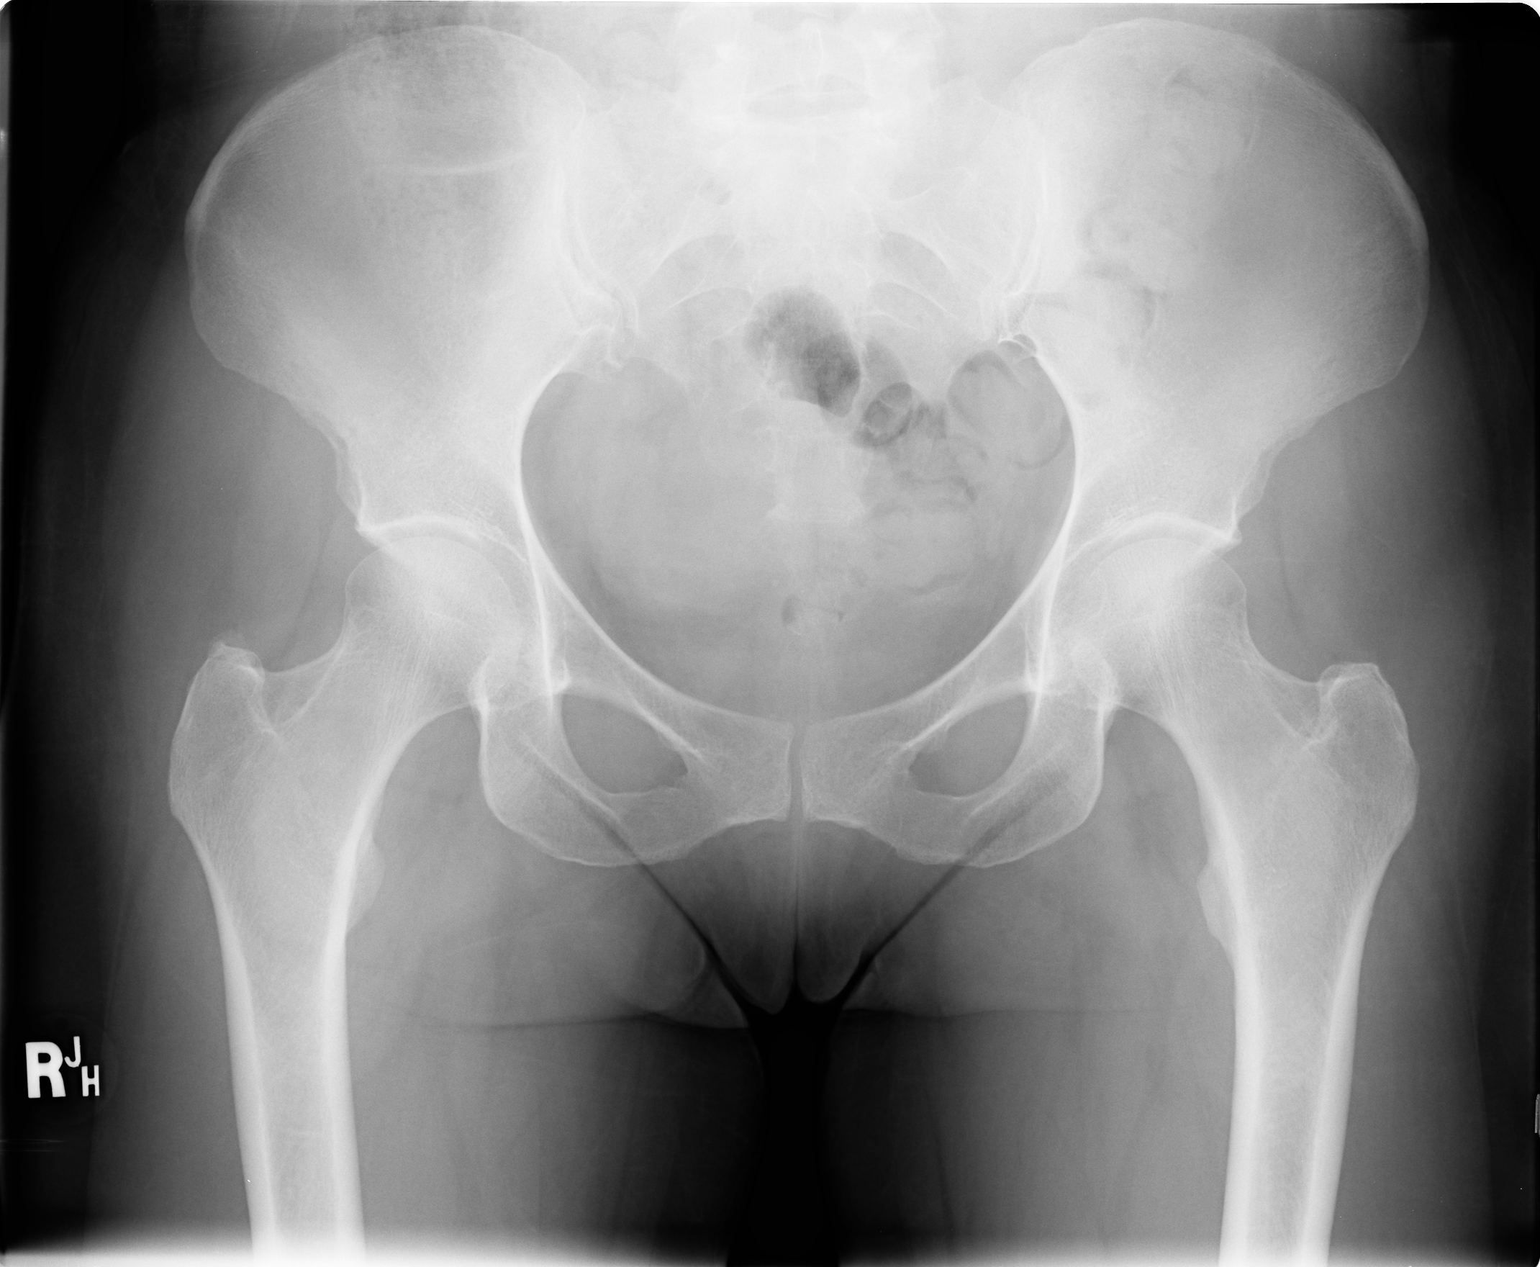

[1 of 1 positions shown; findings below may reference images not displayed]

FINDINGS: There is no evidence of pelvic fracture or diastasis. No other
pelvic bone lesions are seen.
IMPRESSION: Negative one view pelvis.

## 2013-05-23 IMAGING — CR DG HIP 1V*R*
1 series · 1 of 1 positions shown · non-contrast
Comparison: None.

CLINICAL DATA: Chronic right hip pain. Popping and clicking sounds.

EXAM:
RIGHT HIP - 1 VIEW

[view not recorded]
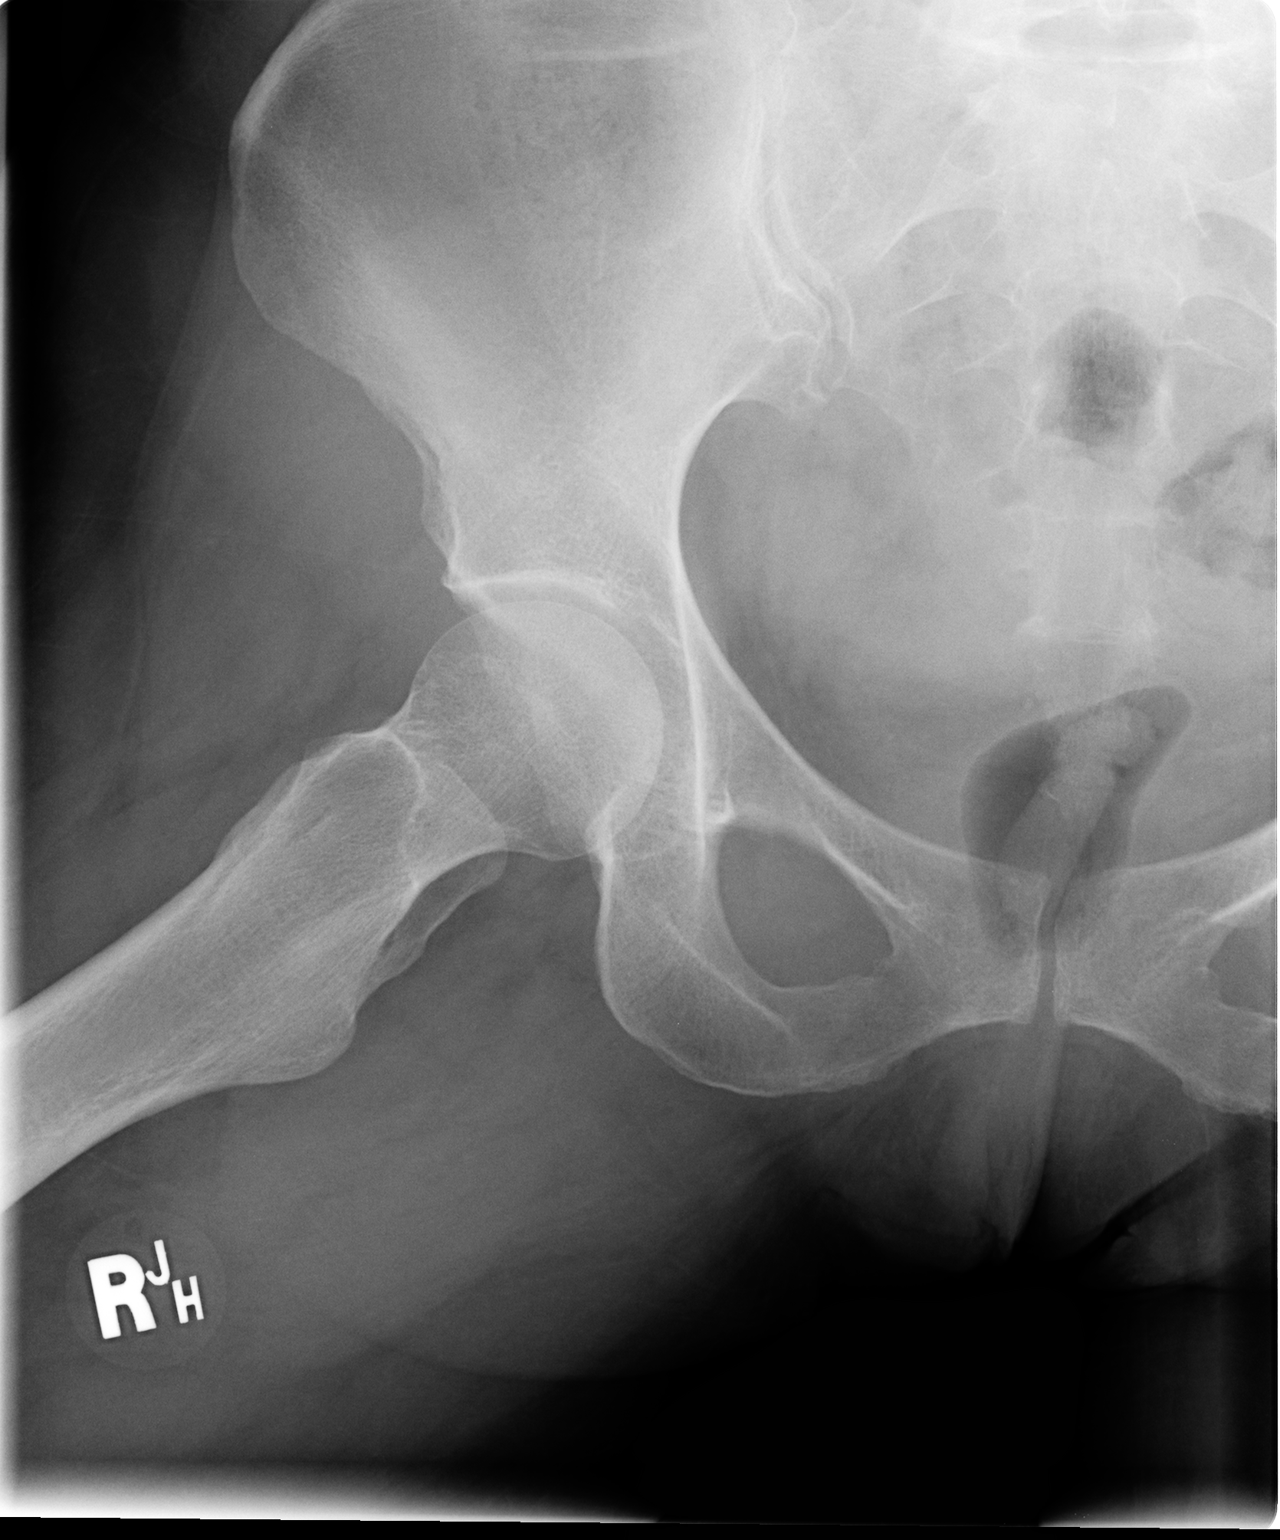

[1 of 1 positions shown; findings below may reference images not displayed]

FINDINGS: Grade hip is located. No acute bone or soft tissue abnormality is
present.
IMPRESSION: Negative frog-lateral view of the right hip.

## 2013-05-24 ENCOUNTER — Telehealth: Payer: Self-pay | Admitting: *Deleted

## 2013-05-24 NOTE — Telephone Encounter (Signed)
Message copied by Mora Bellman on Tue May 24, 2013  3:23 PM ------      Message from: Daine Gip      Created: Tue May 24, 2013  2:47 PM       Please notify patient that hip xray is negative. No sign of arthritis. Nothing to explain clunking in hip. As this remains non-painful, would not worry about it. ------

## 2013-05-24 NOTE — Telephone Encounter (Signed)
Left pt a VM to return call.

## 2013-05-25 NOTE — Telephone Encounter (Signed)
Spoke with pt- gave her x-ray results and message from Dr. Neomia Dear.

## 2013-06-27 ENCOUNTER — Encounter: Payer: Self-pay | Admitting: Internal Medicine

## 2014-08-29 LAB — HM PAP SMEAR

## 2014-08-29 LAB — HM MAMMOGRAPHY

## 2014-10-03 ENCOUNTER — Encounter: Payer: Self-pay | Admitting: Podiatry

## 2014-10-03 ENCOUNTER — Ambulatory Visit (INDEPENDENT_AMBULATORY_CARE_PROVIDER_SITE_OTHER): Payer: Federal, State, Local not specified - PPO | Admitting: Podiatry

## 2014-10-03 VITALS — BP 116/63 | HR 65 | Resp 16

## 2014-10-03 DIAGNOSIS — L600 Ingrowing nail: Secondary | ICD-10-CM

## 2014-10-03 MED ORDER — NEOMYCIN-POLYMYXIN-HC 1 % OT SOLN: OTIC | Status: DC

## 2014-10-03 NOTE — Progress Notes (Signed)
   Subjective:    Patient ID: [name], female    DOB: 10/16/1955, 59 y.o.   MRN: [id number]  HPI Comments: "I have an ingrown"  Patient c/o tender 1st toe right, medial border, for about 5 weeks. The area is slightly red. She has had that same border removed years ago. She has just been trimming it.  Toe Pain       Review of Systems  HENT: Positive for sinus pressure and tinnitus.   Allergic/Immunologic: Positive for environmental allergies and food allergies.  All other systems reviewed and are negative.      Objective:   Physical Exam: I have reviewed her past medical history medications allergies surgery social history and review of systems area pulses are palpable. No calf pain. Sharp incurvated nail margin to the hallux left. Erythema to the tibial border onychocryptosis is present no signs of purulence or malodor.        Assessment & Plan:  Assessment: Ingrown nail paronychia hallux left.  Plan: Performed chemical matrixectomy to the tibial border hallux left. This is performed after local anesthetic was achieved and she tolerated the procedure well. She will start soaking in Epsom salts or antibiotic-soaked and water tomorrow apply Cortisporin Otic as directed and will follow up with me in 1 week.

## 2014-10-03 NOTE — Patient Instructions (Signed)

## 2014-10-10 ENCOUNTER — Encounter: Payer: Self-pay | Admitting: Podiatry

## 2014-10-10 ENCOUNTER — Ambulatory Visit (INDEPENDENT_AMBULATORY_CARE_PROVIDER_SITE_OTHER): Payer: Federal, State, Local not specified - PPO | Admitting: Podiatry

## 2014-10-10 VITALS — BP 115/69 | HR 66 | Resp 12

## 2014-10-10 DIAGNOSIS — L600 Ingrowing nail: Secondary | ICD-10-CM

## 2014-10-10 NOTE — Progress Notes (Signed)
She presents today for follow-up of matrixectomy hallux left. She states it is a little sore but seems to be doing well.  Objective: Vital signs are stable she is alert and oriented 3 mild erythema and no edema saline drainage or odor the margin is clear and without drainage.  Assessment: Well-healing surgical toe hallux left.  Plan: Discontinue Betadine stalled Epsom salts and warm water soaks covered during the day and leave open at night. I will follow-up with her in 1 month if necessary. She will continue to soak until this is completely resolved.

## 2014-10-10 NOTE — Patient Instructions (Signed)

## 2014-11-03 ENCOUNTER — Telehealth: Payer: Self-pay | Admitting: *Deleted

## 2014-11-03 NOTE — Telephone Encounter (Signed)
Pt states her ingrown toenail is getting better, but slowly, does she need to continue the soaks and the drops.  I informed pt that she should continue the soaks for as long as the toe is sore and the drops, but can allow to air dry when resting and not in shoes, until the area gets a dry hard scab without redness, tenderness or swelling.

## 2014-11-07 ENCOUNTER — Telehealth: Payer: Self-pay | Admitting: Internal Medicine

## 2014-11-07 NOTE — Telephone Encounter (Signed)
Pt wanted to know if you would take her on as a pt?     [phone]

## 2014-11-08 NOTE — Telephone Encounter (Signed)
Yes.  Thank you.

## 2014-11-14 NOTE — Telephone Encounter (Signed)
Patient scheduled.

## 2014-12-01 ENCOUNTER — Other Ambulatory Visit (INDEPENDENT_AMBULATORY_CARE_PROVIDER_SITE_OTHER): Payer: Federal, State, Local not specified - PPO

## 2014-12-01 ENCOUNTER — Encounter: Payer: Self-pay | Admitting: Internal Medicine

## 2014-12-01 ENCOUNTER — Ambulatory Visit (INDEPENDENT_AMBULATORY_CARE_PROVIDER_SITE_OTHER): Payer: Federal, State, Local not specified - PPO | Admitting: Internal Medicine

## 2014-12-01 VITALS — BP 140/80 | HR 73 | Temp 98.5°F | Ht 64.0 in | Wt 134.0 lb

## 2014-12-01 DIAGNOSIS — Z0000 Encounter for general adult medical examination without abnormal findings: Secondary | ICD-10-CM

## 2014-12-01 DIAGNOSIS — D649 Anemia, unspecified: Secondary | ICD-10-CM | POA: Insufficient documentation

## 2014-12-01 DIAGNOSIS — Z85828 Personal history of other malignant neoplasm of skin: Secondary | ICD-10-CM

## 2014-12-01 DIAGNOSIS — F411 Generalized anxiety disorder: Secondary | ICD-10-CM | POA: Diagnosis not present

## 2014-12-01 DIAGNOSIS — Z23 Encounter for immunization: Secondary | ICD-10-CM | POA: Diagnosis not present

## 2014-12-01 DIAGNOSIS — D6489 Other specified anemias: Secondary | ICD-10-CM

## 2014-12-01 LAB — BASIC METABOLIC PANEL
BUN: 15 mg/dL (ref 6–23)
CO2: 28 mEq/L (ref 19–32)
Calcium: 9.2 mg/dL (ref 8.4–10.5)
Chloride: 104 mEq/L (ref 96–112)
Creatinine, Ser: 0.72 mg/dL (ref 0.40–1.20)
GFR: 88.42 mL/min (ref >60.00)
Glucose, Bld: 70 mg/dL (ref 70–99)
Potassium: 3.9 mEq/L (ref 3.5–5.1)
Sodium: 138 mEq/L (ref 135–145)

## 2014-12-01 LAB — LIPID PANEL
Cholesterol: 207 mg/dL — ABNORMAL HIGH (ref 0–200)
HDL: 76 mg/dL (ref >39.00)
LDL Cholesterol: 107 mg/dL — ABNORMAL HIGH (ref 0–99)
NonHDL: 131
Total CHOL/HDL Ratio: 3
Triglycerides: 120 mg/dL (ref 0.0–149.0)
VLDL: 24 mg/dL (ref 0.0–40.0)

## 2014-12-01 LAB — CBC WITH DIFFERENTIAL/PLATELET
Basophils Absolute: 0 10*3/uL (ref 0.0–0.1)
Basophils Relative: 0.4 % (ref 0.0–3.0)
Eosinophils Absolute: 0.2 10*3/uL (ref 0.0–0.7)
Eosinophils Relative: 4.1 % (ref 0.0–5.0)
HCT: 40.1 % (ref 36.0–46.0)
Hemoglobin: 13.7 g/dL (ref 12.0–15.0)
Lymphocytes Relative: 28.9 % (ref 12.0–46.0)
Lymphs Abs: 1.2 10*3/uL (ref 0.7–4.0)
MCHC: 34.2 g/dL (ref 30.0–36.0)
MCV: 90.9 fl (ref 78.0–100.0)
Monocytes Absolute: 0.3 10*3/uL (ref 0.1–1.0)
Monocytes Relative: 7.5 % (ref 3.0–12.0)
Neutro Abs: 2.4 10*3/uL (ref 1.4–7.7)
Neutrophils Relative %: 59.1 % (ref 43.0–77.0)
Platelets: 220 10*3/uL (ref 150.0–400.0)
RBC: 4.41 Mil/uL (ref 3.87–5.11)
RDW: 13.6 % (ref 11.5–15.5)
WBC: 4.1 10*3/uL (ref 4.0–10.5)

## 2014-12-01 LAB — HEPATIC FUNCTION PANEL
ALT: 15 U/L (ref 0–35)
AST: 20 U/L (ref 0–37)
Albumin: 4.1 g/dL (ref 3.5–5.2)
Alkaline Phosphatase: 63 U/L (ref 39–117)
Bilirubin, Direct: 0.1 mg/dL (ref 0.0–0.3)
Total Bilirubin: 0.7 mg/dL (ref 0.2–1.2)
Total Protein: 6.4 g/dL (ref 6.0–8.3)

## 2014-12-01 LAB — URINALYSIS
Bilirubin Urine: NEGATIVE
Hgb urine dipstick: NEGATIVE
Ketones, ur: NEGATIVE
Leukocytes, UA: NEGATIVE
Nitrite: NEGATIVE
Specific Gravity, Urine: 1.015 (ref 1.000–1.030)
Total Protein, Urine: NEGATIVE
Urine Glucose: NEGATIVE
Urobilinogen, UA: 0.2 (ref 0.0–1.0)
pH: 6 (ref 5.0–8.0)

## 2014-12-01 LAB — IBC PANEL
Iron: 123 ug/dL (ref 42–145)
Saturation Ratios: 38.2 % (ref 20.0–50.0)
Transferrin: 230 mg/dL (ref 212.0–360.0)

## 2014-12-01 LAB — VITAMIN D 25 HYDROXY (VIT D DEFICIENCY, FRACTURES): VITD: 32.42 ng/mL (ref 30.00–100.00)

## 2014-12-01 LAB — VITAMIN B12: Vitamin B-12: 518 pg/mL (ref 211–911)

## 2014-12-01 NOTE — Assessment & Plan Note (Signed)
We discussed age appropriate health related issues, including available/recomended screening tests and vaccinations. We discussed a need for adhering to healthy diet and exercise. Labs/EKG were reviewed/ordered. All questions were answered.   

## 2014-12-01 NOTE — Progress Notes (Signed)
Pre visit review using our clinic review tool, if applicable. No additional management support is needed unless otherwise documented below in the visit note. 

## 2014-12-01 NOTE — Assessment & Plan Note (Signed)
Dr Lomax 

## 2014-12-01 NOTE — Assessment & Plan Note (Signed)
2016 mild Rare blood donor Labs

## 2014-12-01 NOTE — Progress Notes (Signed)
Subjective:    HPI  The patient is here for a wellness exam. The patient has been doing well overall without major physical or psychological issues going on lately. C/o anemia, fatigue. Pt had last colon in 2015 - Dr Earlean Shawl  Wt Readings from Last 3 Encounters:  12/01/14 134 lb (60.782 kg)  05/18/13 134 lb (60.782 kg)  02/25/11 130 lb (58.968 kg)   BP Readings from Last 3 Encounters:  12/01/14 140/80  10/10/14 115/69  10/03/14 116/63       Review of Systems  Constitutional: Negative.  Negative for fever, chills, diaphoresis, activity change, appetite change, fatigue and unexpected weight change.  HENT: Negative for congestion, ear pain, facial swelling, hearing loss, mouth sores, nosebleeds, postnasal drip, rhinorrhea, sinus pressure, sneezing, sore throat, tinnitus and trouble swallowing.        Mouth burning   Eyes: Negative for pain, discharge, redness, itching and visual disturbance.  Respiratory: Negative for cough, chest tightness, shortness of breath, wheezing and stridor.   Cardiovascular: Negative for chest pain, palpitations and leg swelling.  Gastrointestinal: Negative for nausea, diarrhea, constipation, blood in stool, abdominal distention, anal bleeding and rectal pain.  Genitourinary: Negative for dysuria, urgency, frequency, hematuria, flank pain, vaginal bleeding, vaginal discharge, difficulty urinating, genital sores and pelvic pain.  Musculoskeletal: Negative for back pain, joint swelling, arthralgias, gait problem, neck pain and neck stiffness.  Skin: Negative.  Negative for rash.  Neurological: Negative for dizziness, tremors, seizures, syncope, speech difficulty, weakness, numbness and headaches.  Hematological: Negative for adenopathy. Does not bruise/bleed easily.  Psychiatric/Behavioral: Negative for suicidal ideas, behavioral problems, sleep disturbance, dysphoric mood and decreased concentration. The patient is not nervous/anxious.        Objective:    Physical Exam  Constitutional: She appears well-developed and well-nourished. No distress.  HENT:  Head: Normocephalic.  Right Ear: External ear normal.  Left Ear: External ear normal.  Nose: Nose normal.  Mouth/Throat: Oropharynx is clear and moist.  Eyes: Conjunctivae are normal. Pupils are equal, round, and reactive to light. Right eye exhibits no discharge. Left eye exhibits no discharge.  Neck: Normal range of motion. Neck supple. No JVD present. No tracheal deviation present. No thyromegaly present.  Cardiovascular: Normal rate, regular rhythm and normal heart sounds.   Pulmonary/Chest: No stridor. No respiratory distress. She has no wheezes.  Abdominal: Soft. Bowel sounds are normal. She exhibits no distension and no mass. There is no tenderness. There is no rebound and no guarding.  Musculoskeletal: She exhibits no edema or tenderness.  Lymphadenopathy:    She has no cervical adenopathy.  Neurological: She displays normal reflexes. No cranial nerve deficit. She exhibits normal muscle tone. Coordination normal.  Skin: No rash noted. No erythema.  Psychiatric: She has a normal mood and affect. Her behavior is normal. Judgment and thought content normal.       Lab Results  Component Value Date   WBC 4.1* 11/11/2010   HGB 14.1 11/11/2010   HCT 40.2 11/11/2010   PLT 212.0 11/11/2010   CHOL 211* 11/11/2010   TRIG 71.0 11/11/2010   HDL 93.30 11/11/2010   LDLDIRECT 102.8 11/11/2010   ALT 16 11/11/2010   AST 21 11/11/2010   NA 136 11/11/2010   K 4.0 11/11/2010   CL 100 11/11/2010   CREATININE 0.8 11/11/2010   BUN 16 11/11/2010   CO2 29 11/11/2010   TSH 0.99 11/11/2010      Assessment & Plan:  No problem-specific assessment & plan notes found for this encounter.

## 2014-12-05 ENCOUNTER — Ambulatory Visit: Payer: Federal, State, Local not specified - PPO | Admitting: Podiatry

## 2014-12-12 ENCOUNTER — Encounter: Payer: Self-pay | Admitting: Podiatry

## 2014-12-12 ENCOUNTER — Ambulatory Visit (INDEPENDENT_AMBULATORY_CARE_PROVIDER_SITE_OTHER): Payer: Federal, State, Local not specified - PPO | Admitting: Podiatry

## 2014-12-12 VITALS — BP 118/67 | HR 66 | Temp 98.1°F | Resp 16

## 2014-12-12 DIAGNOSIS — L03039 Cellulitis of unspecified toe: Secondary | ICD-10-CM

## 2014-12-12 NOTE — Progress Notes (Signed)
She presents today concerned that her matrixectomy is allowing the nail to grow back. She states it is a little sore back here in this corner should refers to the tibial border hallux left. She states that she's continue to soak it has been a very good patient and the nail now becomes painful once again.  Objective: Vital signs are stable alert and oriented 3. Pulses are strongly palpable. Along the tibial border of the hallux left demonstrates the nail is growing out and I believe this just the corner of the nail this growing through the skin resulting in some soreness. I am able to see the margin and it appears to be clear and I see no signs of ingrown toenail.  Assessment: Mild paronychia associated with matrixectomy appears to be healing.  Plan: I will continue to soak on a daily basis until the nail edge gets out to where she can clip the nail. I will follow-up with her should this

## 2015-07-19 DIAGNOSIS — C55 Malignant neoplasm of uterus, part unspecified: Secondary | ICD-10-CM | POA: Insufficient documentation

## 2015-08-06 ENCOUNTER — Ambulatory Visit: Payer: Federal, State, Local not specified - PPO | Attending: Gynecologic Oncology | Admitting: Gynecologic Oncology

## 2015-08-06 ENCOUNTER — Encounter: Payer: Self-pay | Admitting: Gynecologic Oncology

## 2015-08-06 VITALS — BP 127/62 | HR 78 | Temp 98.1°F | Resp 18 | Ht 64.0 in | Wt 135.3 lb

## 2015-08-06 DIAGNOSIS — F1010 Alcohol abuse, uncomplicated: Secondary | ICD-10-CM | POA: Insufficient documentation

## 2015-08-06 DIAGNOSIS — F419 Anxiety disorder, unspecified: Secondary | ICD-10-CM | POA: Diagnosis not present

## 2015-08-06 DIAGNOSIS — K279 Peptic ulcer, site unspecified, unspecified as acute or chronic, without hemorrhage or perforation: Secondary | ICD-10-CM | POA: Diagnosis not present

## 2015-08-06 DIAGNOSIS — Z808 Family history of malignant neoplasm of other organs or systems: Secondary | ICD-10-CM | POA: Insufficient documentation

## 2015-08-06 DIAGNOSIS — J45909 Unspecified asthma, uncomplicated: Secondary | ICD-10-CM | POA: Insufficient documentation

## 2015-08-06 DIAGNOSIS — D649 Anemia, unspecified: Secondary | ICD-10-CM | POA: Diagnosis not present

## 2015-08-06 DIAGNOSIS — K219 Gastro-esophageal reflux disease without esophagitis: Secondary | ICD-10-CM | POA: Diagnosis not present

## 2015-08-06 DIAGNOSIS — Z79890 Hormone replacement therapy (postmenopausal): Secondary | ICD-10-CM | POA: Insufficient documentation

## 2015-08-06 DIAGNOSIS — E039 Hypothyroidism, unspecified: Secondary | ICD-10-CM | POA: Insufficient documentation

## 2015-08-06 DIAGNOSIS — Z87898 Personal history of other specified conditions: Secondary | ICD-10-CM | POA: Diagnosis not present

## 2015-08-06 DIAGNOSIS — C541 Malignant neoplasm of endometrium: Secondary | ICD-10-CM | POA: Diagnosis not present

## 2015-08-06 DIAGNOSIS — Z825 Family history of asthma and other chronic lower respiratory diseases: Secondary | ICD-10-CM | POA: Diagnosis not present

## 2015-08-06 DIAGNOSIS — M8580 Other specified disorders of bone density and structure, unspecified site: Secondary | ICD-10-CM | POA: Insufficient documentation

## 2015-08-06 DIAGNOSIS — Z8249 Family history of ischemic heart disease and other diseases of the circulatory system: Secondary | ICD-10-CM | POA: Insufficient documentation

## 2015-08-06 NOTE — Patient Instructions (Signed)
Preparing for your Surgery  Plan for surgery on February 14 with Dr. Everitt Amber.  You will be scheduled a robotic assisted total hysterectomy, bilateral salpingo-oophorectomy, sentinel lymph node biopsy.    Pre-operative Testing -You will receive a phone call from presurgical testing at [facility] to arrange for a pre-operative testing appointment before your surgery.  This appointment normally occurs one to two weeks before your scheduled surgery.   -Bring your insurance card, copy of an advanced directive if applicable, medication list  -At that visit, you will be asked to sign a consent for a possible blood transfusion in case a transfusion becomes necessary during surgery.  The need for a blood transfusion is rare but having consent is a necessary part of your care.     -You should not be taking blood thinners or aspirin at least ten days prior to surgery unless instructed by your surgeon.  Day Before Surgery at [facility] will be asked to take in only clear liquids the day before surgery.  Examples of clear liquids include broths, jello, and clear juices.  Avoid carbonated beverages.  You will be advised to have nothing to eat or drink after midnight the evening before.    Your role in recovery Your role is to become active as soon as directed by your doctor, while still giving yourself time to heal.  Rest when you feel tired. You will be asked to do the following in order to speed your recovery:  - Cough and breathe deeply. This helps toclear and expand your lungs and can prevent pneumonia. You may be given a spirometer to practice deep breathing. A staff member will show you how to use the spirometer. - Do mild physical activity. Walking or moving your legs help your circulation and body functions return to normal. A staff member will help you when you try to walk and will provide you with simple exercises. Do not try to get up or walk alone the first time. -  Actively manage your pain. Managing your pain lets you move in comfort. We will ask you to rate your pain on a scale of zero to 10. It is your responsibility to tell your doctor or nurse where and how much you hurt so your pain can be treated.  Special Considerations -If you are diabetic, you may be placed on insulin after surgery to have closer control over your blood sugars to promote healing and recovery.  This does not mean that you will be discharged on insulin.  If applicable, your oral antidiabetics will be resumed when you are tolerating a solid diet.  -Your final pathology results from surgery should be available by the Friday after surgery and the results will be relayed to you when available.  Blood Transfusion Information WHAT IS A BLOOD TRANSFUSION? A transfusion is the replacement of blood or some of its parts. Blood is made up of multiple cells which provide different functions.  Red blood cells carry oxygen and are used for blood loss replacement.  White blood cells fight against infection.  Platelets control bleeding.  Plasma helps clot blood.  Other blood products are available for specialized needs, such as hemophilia or other clotting disorders. BEFORE THE TRANSFUSION  Who gives blood for transfusions?   You may be able to donate blood to be used at a later date on yourself (autologous donation).  Relatives can be asked to donate blood. This is generally not any safer than if you have received blood  from a stranger. The same precautions are taken to ensure safety when a relative's blood is donated.  Healthy volunteers who are fully evaluated to make sure their blood is safe. This is blood bank blood. Transfusion therapy is the safest it has ever been in the practice of medicine. Before blood is taken from a donor, a complete history is taken to make sure that person has no history of diseases nor engages in risky social behavior (examples are intravenous drug use or  sexual activity with multiple partners). The donor's travel history is screened to minimize risk of transmitting infections, such as malaria. The donated blood is tested for signs of infectious diseases, such as HIV and hepatitis. The blood is then tested to be sure it is compatible with you in order to minimize the chance of a transfusion reaction. If you or a relative donates blood, this is often done in anticipation of surgery and is not appropriate for emergency situations. It takes many days to process the donated blood. RISKS AND COMPLICATIONS Although transfusion therapy is very safe and saves many lives, the main dangers of transfusion include:   Getting an infectious disease.  Developing a transfusion reaction. This is an allergic reaction to something in the blood you were given. Every precaution is taken to prevent this. The decision to have a blood transfusion has been considered carefully by your caregiver before blood is given. Blood is not given unless the benefits outweigh the risks.

## 2015-08-06 NOTE — Progress Notes (Signed)
Consult Note: Gyn-Onc  Consult was requested by Dr. Helane Rima for the evaluation of KEVA STRID 60 y.o. female  CC:  Chief Complaint  Patient presents with  . endometrial adenocarcimona    New Consultation    Assessment/Plan:  Ms. LOVELEEN PELLAND  is a 60 y.o.  year old with grade 2 endometrioid endometrial cancer.   A detailed discussion was held with the patient and her family with regard to to her endometrial cancer diagnosis. We discussed the standard management options for uterine cancer which includes surgery followed possibly by adjuvant therapy depending on the results of surgery. The options for surgical management include a hysterectomy and removal of the tubes and ovaries possibly with removal of pelvic and para-aortic lymph nodes. A minimally invasive approach including a robotic hysterectomy or laparoscopic hysterectomy have benefits including shorter hospital stay, recovery time and better wound healing. The alternative approach is an open hysterectomy. The patient has been counseled about these surgical options and the risks of surgery in general including infection, bleeding, damage to surrounding structures (including bowel, bladder, ureters, nerves or vessels), and the postoperative risks of PE/ DVT, and lymphedema. I extensively reviewed the additional risks of robotic hysterectomy including possible need for conversion to open laparotomy.  I discussed positioning during surgery of trendelenberg and risks of minor facial swelling and care we take in preoperative positioning.  After counseling and consideration of her options, she desires to proceed with robotic assisted total hysterectomy, BSO, sentinel lymph node mapping.   She will be seen by anesthesia for preoperative clearance and discussion of postoperative pain management.  She was given the opportunity to ask questions, which were answered to her satisfaction, and she is agreement with the above mentioned plan of  care.  HPI: Armanee Nuffer is a 60 year old G0 who is seen in consultation at the request of Dr Helane Rima for grade 2 endometrial cancer. The patient reports transitioning to menopause for early in life, approximate age 69. She had intermittent light vaginal spotting after intercourse from unknown submucosal fibroid over the years. He'll be monitored with ultrasound. She was treated for her pre-mature menopause with initially manufactured hormone replacement therapy and then eventually with custom compounded estradiol and progesterone.  In the fall of 2016 she began experiencing more frequent vaginal bleeding. Dr. Pearline Cables while performed transvaginal ultrasound 06/08/2015 which revealed a uterus measuring 7.3 x 4.5 x 5.5 cm. Endometrial thickness is 1.8 cm. Left and right ovaries were grossly normal. The endometrium contained a 18 x 16 mm submucosal fibroid with fetal vessel seen. She then underwent a subsequent D&C procedure on 12/17/2015. Pathology from this revealed endometrioid adenocarcinoma with a patent favoring FIGO grade 2.   She is nulliparous and has a history of infertility (primary). She has family history only for skin cancer (no colon or uterine).  Current Meds:  Outpatient Encounter Prescriptions as of 08/06/2015  Medication Sig  . Calcium Citrate-Vitamin D (CALCIUM CITRATE +D PO) Take by mouth daily.  . cholecalciferol (VITAMIN D) 1000 units tablet Take 2,000 Units by mouth daily.  Marland Kitchen EPIPEN 2-PAK 0.3 MG/0.3ML DEVI Inject 0.3 mg into the muscle once.   Marland Kitchen levothyroxine (SYNTHROID, LEVOTHROID) 25 MCG tablet Take 25-50 mcg by mouth as directed. 25 mcg 4 days per week, 50 mcg 3 days per week  . liothyronine (CYTOMEL) 5 MCG tablet Take 5-10 mcg by mouth See admin instructions. Takes 5 mcg in the morning and 10 mcg in the afternonooon  . MELATONIN-CHAMOMILE PO Take  1 capsule by mouth at bedtime as needed and may repeat dose one time if needed.  . MetroNIDAZOLE (METROLOTION EX) Apply topically.  .  minoxidil (ROGAINE) 2 % external solution Apply topically 2 (two) times daily.  . Multiple Vitamin (MULTIVITAMIN) tablet Take 1 tablet by mouth daily.  Marland Kitchen pyridoxine (B-6) 200 MG tablet Take 200 mg by mouth daily.  . TURMERIC PO Take 1 capsule by mouth daily.  Marland Kitchen ALPRAZolam (XANAX) 0.5 MG tablet Reported on 08/06/2015  . Chlorpheniramine Maleate (ALLERGY PO) Take by mouth. Reported on 08/06/2015  . ESTRADIOL TD Place 1.25 % onto the skin daily. Reported on 08/06/2015  . fluticasone (FLONASE) 50 MCG/ACT nasal spray as needed. Reported on 08/06/2015  . Misc Natural Products (PROGESTERONE EX) Apply topically daily. Reported on 08/06/2015  . PROAIR RESPICLICK 123XX123 (90 BASE) MCG/ACT AEPB as needed. Reported on 08/06/2015  . vitamin E 400 UNIT capsule Take 400 Units by mouth daily. Reported on 08/06/2015  . [DISCONTINUED] NEOMYCIN-POLYMYXIN-HYDROCORTISONE (CORTISPORIN) 1 % SOLN otic solution Apply 1-2 drops to toe BID after soaking   No facility-administered encounter medications on file as of 08/06/2015.    Allergy:  Allergies  Allergen Reactions  . Codeine Other (See Comments)    unknown  . Demerol Nausea And Vomiting  . Other Other (See Comments)    Shell Fish Stomach issues  . Penicillins Hives    Social Hx:   Social History   Social History  . Marital Status: Married    Spouse Name: N/A  . Number of Children: N/A  . Years of Education: N/A   Occupational History  . Not on file.   Social History Main Topics  . Smoking status: Never Smoker   . Smokeless tobacco: Never Used     Comment: Married, 1 child  . Alcohol Use: Yes  . Drug Use: No  . Sexual Activity: Yes   Other Topics Concern  . Not on file   Social History Narrative    Past Surgical Hx:  Past Surgical History  Procedure Laterality Date  . Mandib advancement forward  2009    Past Medical Hx:  Past Medical History  Diagnosis Date  . HYPOTHYROIDISM 11/12/2007    Dr Chalmers Cater  . ANXIETY 03/02/2007  . ALLERGIC RHINITIS  03/02/2007  . ASTHMA 11/12/2007    Dr Orvil Feil  . TMJ SYNDROME 11/12/2007  . GERD 03/02/2007  . PEPTIC ULCER DISEASE 03/02/2007  . OSTEOPENIA 11/12/2007  . FIBROCYSTIC BREAST DISEASE, HX OF 03/02/2007  . Cancer (HCC)     skin, hx of  . Anemia     Past Gynecological History:  G0  No LMP recorded.  Family Hx:  Family History  Problem Relation Age of Onset  . Asthma Neg Hx   . Hypertension Other   . Hypertension Father   . Heart disease Father 21    chf  . Heart disease Mother 39    CAD    Review of Systems:  Constitutional  Feels well,    ENT Normal appearing ears and nares bilaterally Skin/Breast  No rash, sores, jaundice, itching, dryness Cardiovascular  No chest pain, shortness of breath, or edema  Pulmonary  No cough or wheeze.  Gastro Intestinal  No nausea, vomitting, or diarrhoea. No bright red blood per rectum, no abdominal pain, change in bowel movement, or constipation.  Genito Urinary  No frequency, urgency, dysuria, + postmenopausal bleeding Musculo Skeletal  No myalgia, arthralgia, joint swelling or pain  Neurologic  No weakness, numbness, change  in gait,  Psychology  No depression, anxiety, insomnia.   Vitals:  Blood pressure 127/62, pulse 78, temperature 98.1 F (36.7 C), temperature source Oral, resp. rate 18, height 5\' 4"  (1.626 m), weight 135 lb 4.8 oz (61.372 kg), SpO2 99 %.  Physical Exam: WD in NAD Neck  Supple NROM, without any enlargements.  Lymph Node Survey No cervical supraclavicular or inguinal adenopathy Cardiovascular  Pulse normal rate, regularity and rhythm. S1 and S2 normal.  Lungs  Clear to auscultation bilateraly, without wheezes/crackles/rhonchi. Good air movement.  Skin  No rash/lesions/breakdown  Psychiatry  Alert and oriented to person, place, and time  Abdomen  Normoactive bowel sounds, abdomen soft, non-tender and thin without evidence of hernia.  Back No CVA tenderness Genito Urinary  Vulva/vagina: Normal external female  genitalia.  No lesions. No discharge or bleeding.  Bladder/urethra:  No lesions or masses, well supported bladder  Vagina: nomral  Cervix: Normal appearing, no lesions.  Uterus:  Small, mobile, no parametrial involvement or nodularity.  Adnexa: no palpable masses. Rectal  deferred.  Extremities  No bilateral cyanosis, clubbing or edema.   Donaciano Eva, MD  08/06/2015, 11:10 AM

## 2015-08-07 ENCOUNTER — Telehealth: Payer: Self-pay | Admitting: Gynecologic Oncology

## 2015-08-07 NOTE — Telephone Encounter (Signed)
Patient called and left a message stating she needs to know about plans for making a family vacation.  Left message for her to call the office.

## 2015-08-07 NOTE — Telephone Encounter (Signed)
Patient returned call.  Stating her family is planning on taking a trip from April 8-15 that would involve hiking etc.  Advised from a surgical standpoint, she should be ok to perform light hiking etc but she may become more fatigued.  Advised if radiation was recommended, treatments may fall into that time frame.  Advised we would not know the recommendations for adjuvant therapy until the final pathology is back.  Patient verbalizing understanding.  Advised that we would work with her if she had a strong desire to go on the trip.  No more questions voiced.  Advised to call for any questions or concerns.

## 2015-08-10 ENCOUNTER — Encounter (HOSPITAL_COMMUNITY)
Admission: RE | Admit: 2015-08-10 | Discharge: 2015-08-10 | Disposition: A | Discharge status: Home / Self Care | Payer: Federal, State, Local not specified - PPO | Source: Ambulatory Visit | Attending: Gynecologic Oncology | Admitting: Gynecologic Oncology

## 2015-08-10 ENCOUNTER — Encounter (HOSPITAL_COMMUNITY): Payer: Self-pay

## 2015-08-10 ENCOUNTER — Ambulatory Visit (HOSPITAL_COMMUNITY)
Admission: RE | Admit: 2015-08-10 | Discharge: 2015-08-10 | Disposition: A | Discharge status: Home / Self Care | Payer: Federal, State, Local not specified - PPO | Source: Ambulatory Visit | Attending: Gynecologic Oncology | Admitting: Gynecologic Oncology

## 2015-08-10 DIAGNOSIS — C541 Malignant neoplasm of endometrium: Secondary | ICD-10-CM | POA: Insufficient documentation

## 2015-08-10 DIAGNOSIS — Z01818 Encounter for other preprocedural examination: Secondary | ICD-10-CM | POA: Diagnosis not present

## 2015-08-10 HISTORY — DX: Unspecified osteoarthritis, unspecified site: M19.90

## 2015-08-10 HISTORY — DX: Cardiac arrhythmia, unspecified: I49.9

## 2015-08-10 HISTORY — DX: Myoneural disorder, unspecified: G70.9

## 2015-08-10 HISTORY — DX: Pneumonia, unspecified organism: J18.9

## 2015-08-10 LAB — URINALYSIS, ROUTINE W REFLEX MICROSCOPIC
Bilirubin Urine: NEGATIVE
Glucose, UA: NEGATIVE mg/dL
Hgb urine dipstick: NEGATIVE
Ketones, ur: NEGATIVE mg/dL
Leukocytes, UA: NEGATIVE
Nitrite: NEGATIVE
Protein, ur: NEGATIVE mg/dL
Specific Gravity, Urine: 1.005 (ref 1.005–1.030)
pH: 7 (ref 5.0–8.0)

## 2015-08-10 LAB — CBC WITH DIFFERENTIAL/PLATELET
Basophils Absolute: 0 10*3/uL (ref 0.0–0.1)
Basophils Relative: 0 %
Eosinophils Absolute: 0.2 10*3/uL (ref 0.0–0.7)
Eosinophils Relative: 3 %
HCT: 41 % (ref 36.0–46.0)
Hemoglobin: 14.1 g/dL (ref 12.0–15.0)
Lymphocytes Relative: 40 %
Lymphs Abs: 1.9 10*3/uL (ref 0.7–4.0)
MCH: 31.3 pg (ref 26.0–34.0)
MCHC: 34.4 g/dL (ref 30.0–36.0)
MCV: 90.9 fL (ref 78.0–100.0)
Monocytes Absolute: 0.2 10*3/uL (ref 0.1–1.0)
Monocytes Relative: 4 %
Neutro Abs: 2.5 10*3/uL (ref 1.7–7.7)
Neutrophils Relative %: 53 %
Platelets: 238 10*3/uL (ref 150–400)
RBC: 4.51 MIL/uL (ref 3.87–5.11)
RDW: 12.7 % (ref 11.5–15.5)
WBC: 4.7 10*3/uL (ref 4.0–10.5)

## 2015-08-10 LAB — COMPREHENSIVE METABOLIC PANEL
ALT: 19 U/L (ref 14–54)
AST: 27 U/L (ref 15–41)
Albumin: 4.4 g/dL (ref 3.5–5.0)
Alkaline Phosphatase: 61 U/L (ref 38–126)
Anion gap: 10 (ref 5–15)
BUN: 15 mg/dL (ref 6–20)
CO2: 30 mmol/L (ref 22–32)
Calcium: 9.4 mg/dL (ref 8.9–10.3)
Chloride: 105 mmol/L (ref 101–111)
Creatinine, Ser: 0.71 mg/dL (ref 0.44–1.00)
GFR calc Af Amer: >60 mL/min (ref >60)
GFR calc non Af Amer: >60 mL/min (ref >60)
Glucose, Bld: 112 mg/dL — ABNORMAL HIGH (ref 65–99)
Potassium: 3.9 mmol/L (ref 3.5–5.1)
Sodium: 145 mmol/L (ref 135–145)
Total Bilirubin: 1.1 mg/dL (ref 0.3–1.2)
Total Protein: 7 g/dL (ref 6.5–8.1)

## 2015-08-10 LAB — ABO/RH: ABO/RH(D): O POS

## 2015-08-10 IMAGING — DX DG CHEST 2V
2 series · 2 of 2 positions shown · non-contrast
Comparison: None.

CLINICAL DATA: Current history of endometrial carcinoma.

EXAM:
CHEST  2 VIEW

[chest pa]
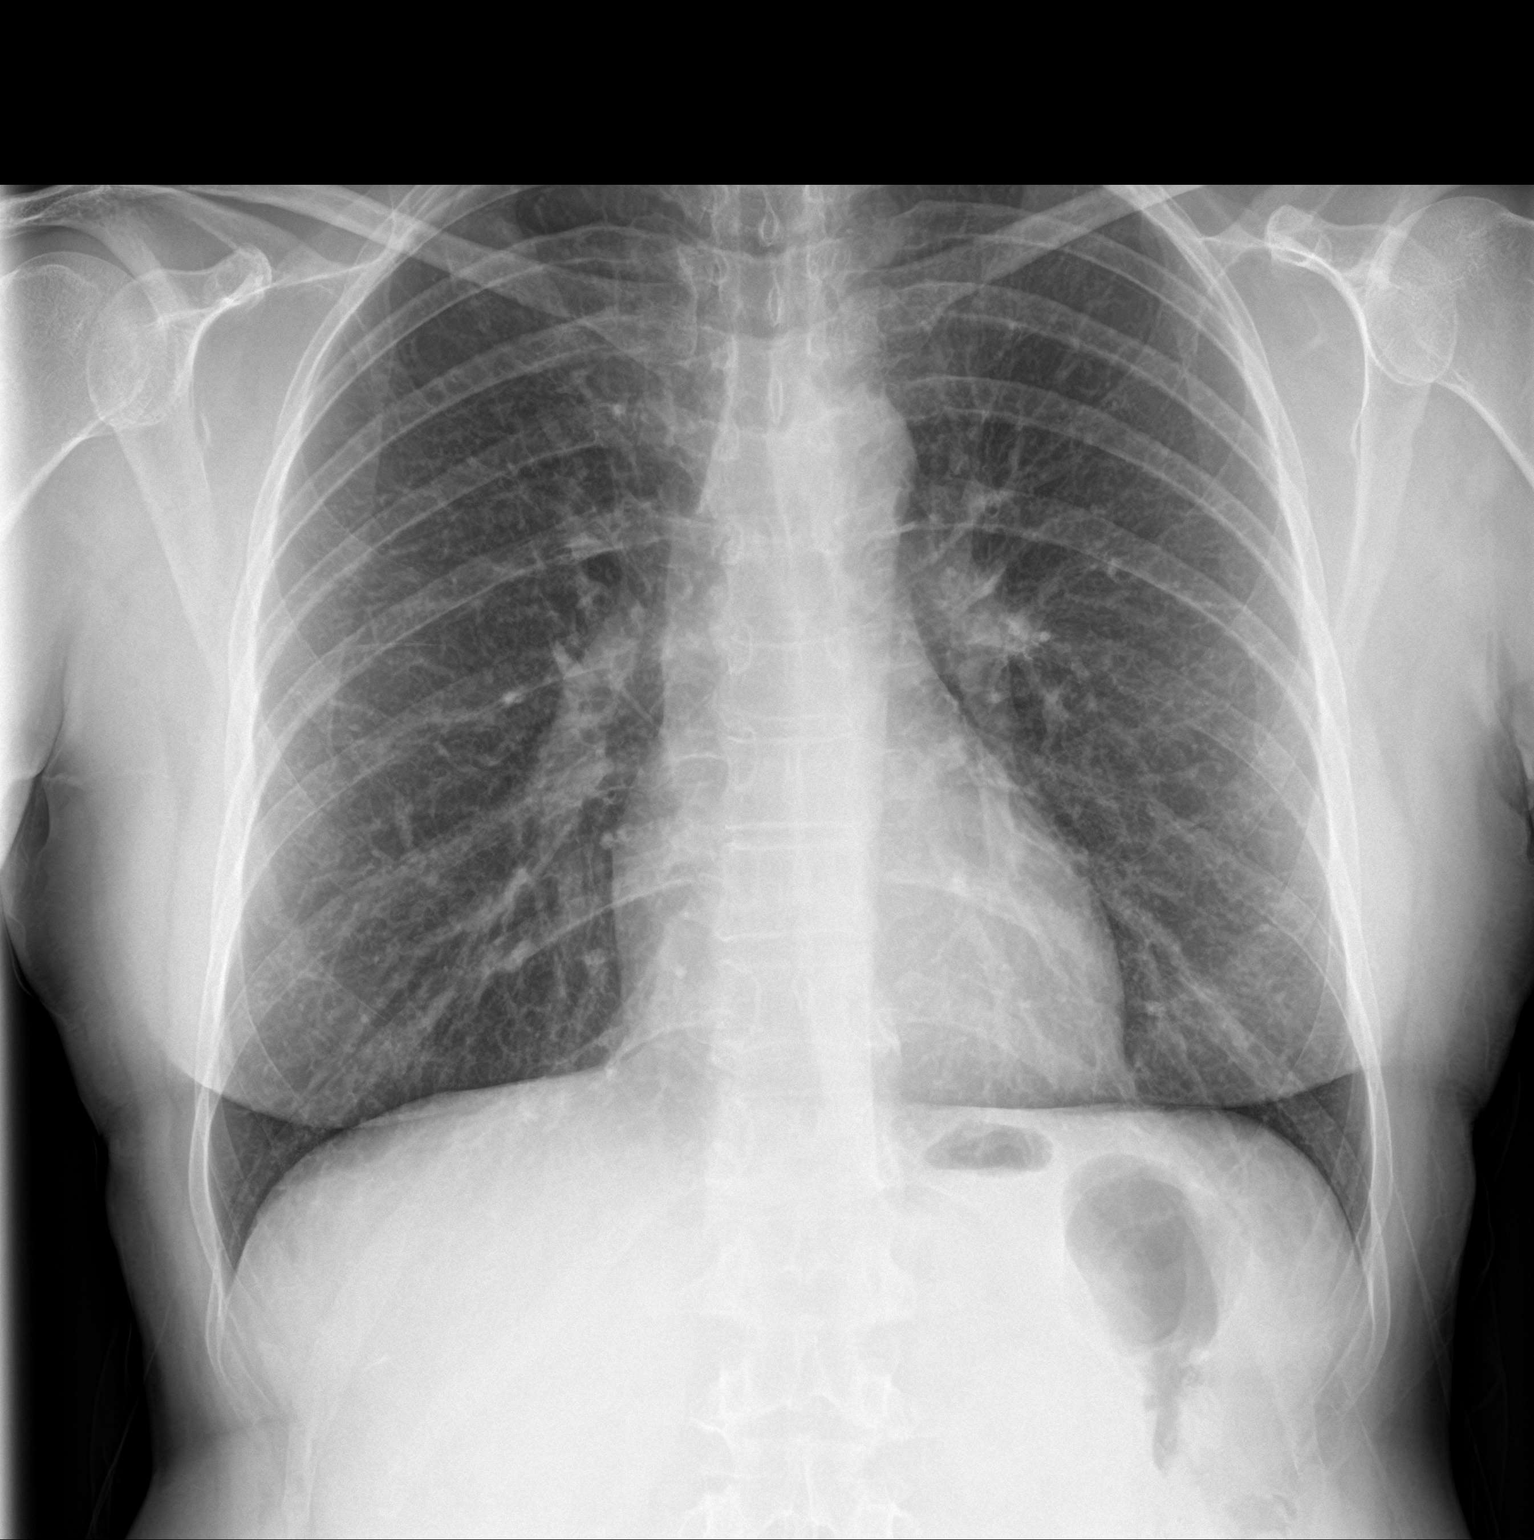

[chest lat]
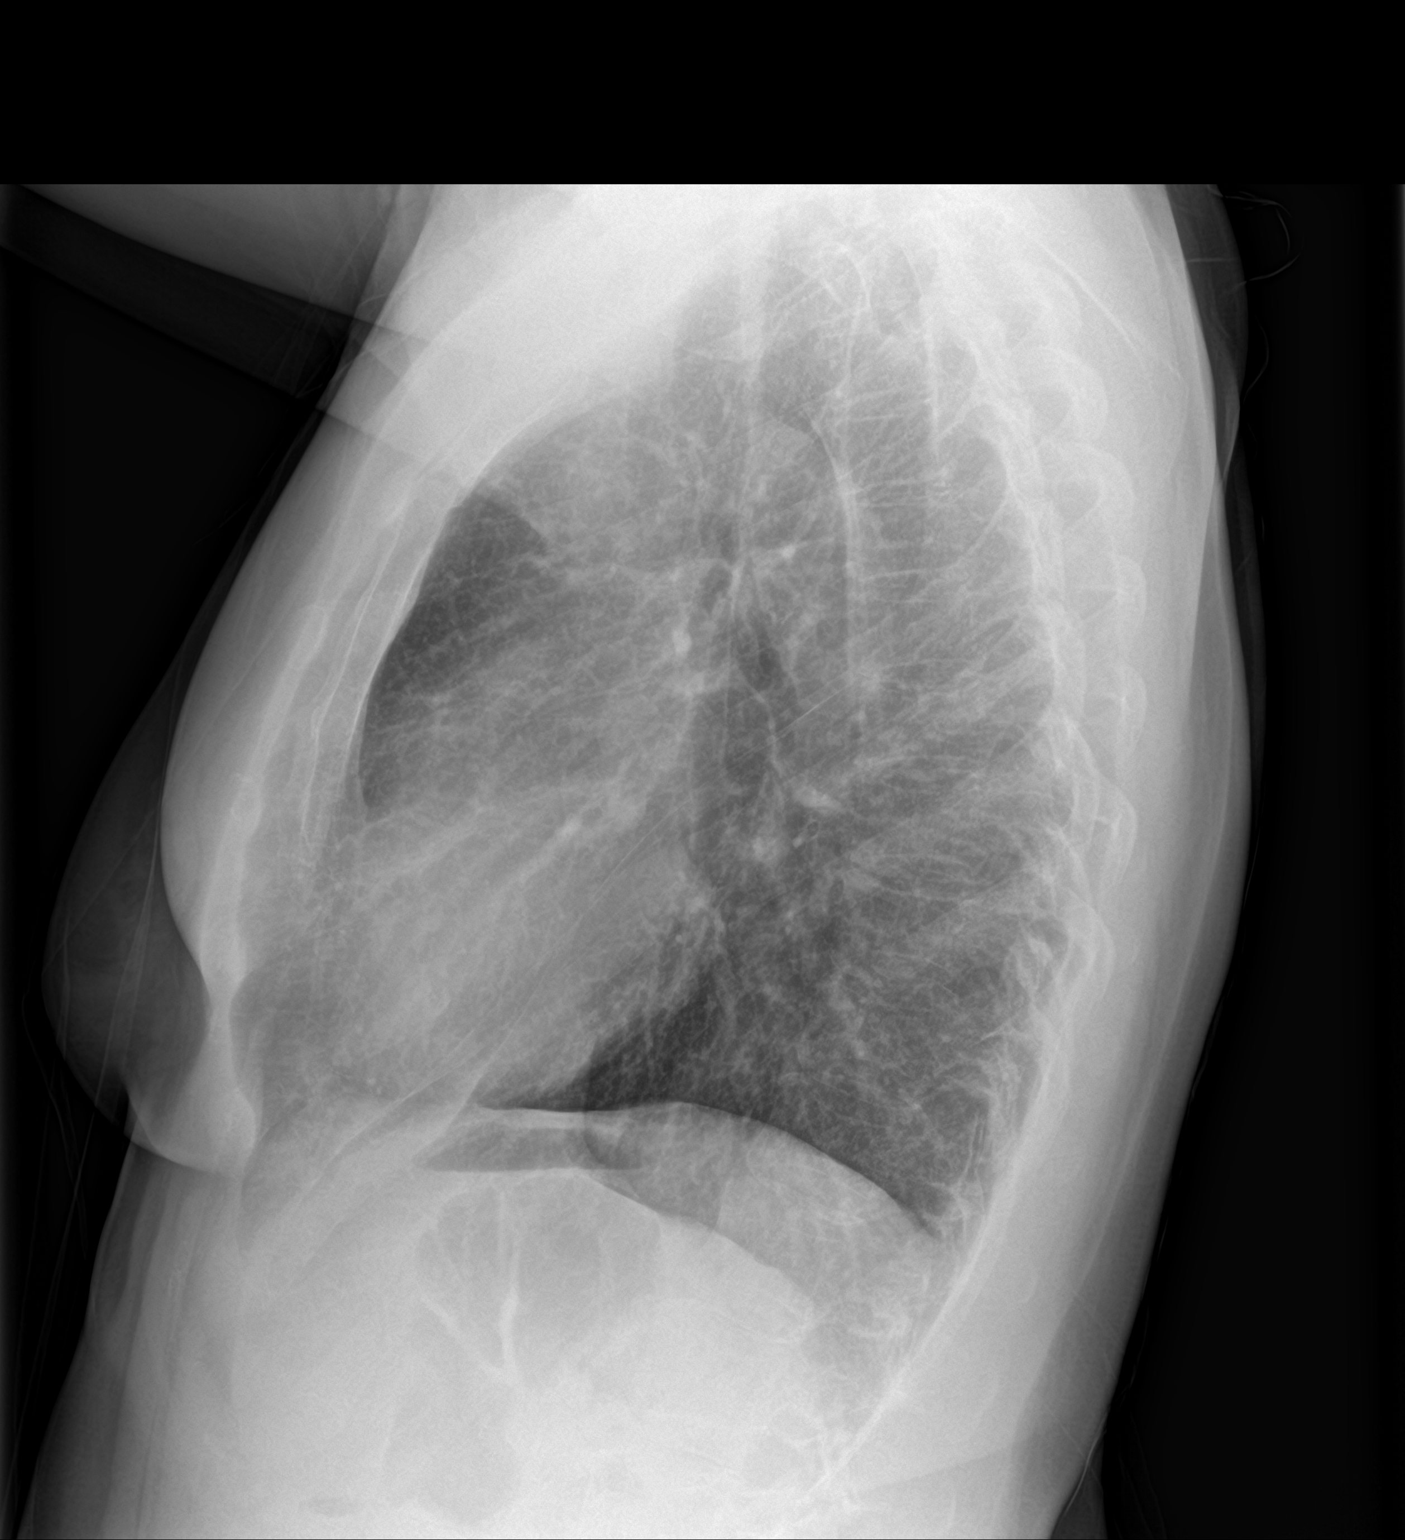

[2 of 2 positions shown; findings below may reference images not displayed]

FINDINGS: The heart size and mediastinal contours are within normal limits.
Both lungs are clear. The visualized skeletal structures are
unremarkable.
IMPRESSION: No active cardiopulmonary disease.

## 2015-08-10 NOTE — Progress Notes (Signed)
08-06-15 - LOV - Dr. Denman George - [facility]     12-01-14 - LOV - Dr. Alain Marion (int.med.) - [facility]  11-14-10 - EKG - [facility]

## 2015-08-10 NOTE — Patient Instructions (Signed)
[name]  08/10/2015   Your procedure is scheduled on: August 14, 2015  Report to [facility] Main  Entrance take [facility]  elevators to 3rd floor to  [place] at 9:15 AM.  Call this number if you have problems the morning of surgery [phone]   Remember: ONLY 1 PERSON MAY GO WITH YOU TO SHORT STAY TO GET  READY MORNING OF [place].  Do not eat food after midnight Sunday night.  Monday morning follow clear liquid diet until midnight  Monday night. Then nothing by mouth.     Take these medicines the morning of surgery with A SIP OF WATER: Xanax if needed, Flonase, Levothyroxine, Liothyronine (Cytomel), Proair inhaler if needed, DO NOT TAKE ANY DIABETIC MEDICATIONS DAY OF YOUR SURGERY                               You may not have any metal on your body including hair pins and              piercings  Do not wear jewelry, make-up, lotions, powders or perfumes, deodorant             Do not wear nail polish.  Do not shave  48 hours prior to surgery.        .   Do not bring valuables to the hospital. [place].  Contacts, dentures or bridgework may not be worn into surgery.  Leave suitcase in the car. After surgery it may be brought to your room.       Special Instructions: coughing and deep breathing exercises, leg exercises              Please read over the following fact sheets you were given: _____________________________________________________________________             [facility] - Preparing for Surgery Before surgery, you can play an important role.  Because skin is not sterile, your skin needs to be as free of germs as possible.  You can reduce the number of germs on your skin by washing with CHG (chlorahexidine gluconate) soap before surgery.  CHG is an antiseptic cleaner which kills germs and bonds with the skin to continue killing germs even after washing. Please DO NOT use if  you have an allergy to CHG or antibacterial soaps.  If your skin becomes reddened/irritated stop using the CHG and inform your nurse when you arrive at Short Stay. Do not shave (including legs and underarms) for at least 48 hours prior to the first CHG shower.  You may shave your face/neck. Please follow these instructions carefully:  1.  Shower with CHG Soap the night before surgery and the  morning of Surgery.  2.  If you choose to wash your hair, wash your hair first as usual with your  normal  shampoo.  3.  After you shampoo, rinse your hair and body thoroughly to remove the  shampoo.                           4.  Use CHG as you would any other liquid soap.  You can apply chg directly  to the skin and wash  Gently with a scrungie or clean washcloth.  5.  Apply the CHG Soap to your body ONLY FROM THE NECK DOWN.   Do not use on face/ open                           Wound or open sores. Avoid contact with eyes, ears mouth and genitals (private parts).                       Wash face,  Genitals (private parts) with your normal soap.             6.  Wash thoroughly, paying special attention to the area where your surgery  will be performed.  7.  Thoroughly rinse your body with warm water from the neck down.  8.  DO NOT shower/wash with your normal soap after using and rinsing off  the CHG Soap.                9.  Pat yourself dry with a clean towel.            10.  Wear clean pajamas.            11.  Place clean sheets on your bed the night of your first shower and do not  sleep with pets. Day of Surgery : Do not apply any lotions/deodorants the morning of surgery.  Please wear clean clothes to the hospital/surgery center.  FAILURE TO FOLLOW THESE INSTRUCTIONS MAY RESULT IN THE CANCELLATION OF YOUR SURGERY PATIENT SIGNATURE_________________________________  NURSE  SIGNATURE__________________________________  ________________________________________________________________________  WHAT IS A BLOOD TRANSFUSION? Blood Transfusion Information  A transfusion is the replacement of blood or some of its parts. Blood is made up of multiple cells which provide different functions.  Red blood cells carry oxygen and are used for blood loss replacement.  White blood cells fight against infection.  Platelets control bleeding.  Plasma helps clot blood.  Other blood products are available for specialized needs, such as hemophilia or other clotting disorders. BEFORE THE TRANSFUSION  Who gives blood for transfusions?   Healthy volunteers who are fully evaluated to make sure their blood is safe. This is blood bank blood. Transfusion therapy is the safest it has ever been in the practice of medicine. Before blood is taken from a donor, a complete history is taken to make sure that person has no history of diseases nor engages in risky social behavior (examples are intravenous drug use or sexual activity with multiple partners). The donor's travel history is screened to minimize risk of transmitting infections, such as malaria. The donated blood is tested for signs of infectious diseases, such as HIV and hepatitis. The blood is then tested to be sure it is compatible with you in order to minimize the chance of a transfusion reaction. If you or a relative donates blood, this is often done in anticipation of surgery and is not appropriate for emergency situations. It takes many days to process the donated blood. RISKS AND COMPLICATIONS Although transfusion therapy is very safe and saves many lives, the main dangers of transfusion include:  1. Getting an infectious disease. 2. Developing a transfusion reaction. This is an allergic reaction to something in the blood you were given. Every precaution is taken to prevent this. The decision to have a blood transfusion has been  considered carefully by your caregiver before blood is given. Blood is not given unless the benefits  outweigh the risks. AFTER THE TRANSFUSION  Right after receiving a blood transfusion, you will usually feel much better and more energetic. This is especially true if your red blood cells have gotten low (anemic). The transfusion raises the level of the red blood cells which carry oxygen, and this usually causes an energy increase.  The nurse administering the transfusion will monitor you carefully for complications. HOME CARE INSTRUCTIONS  No special instructions are needed after a transfusion. You may find your energy is better. Speak with your caregiver about any limitations on activity for underlying diseases you may have. SEEK MEDICAL CARE IF:   Your condition is not improving after your transfusion.  You develop redness or irritation at the intravenous (IV) site. SEEK IMMEDIATE MEDICAL CARE IF:  Any of the following symptoms occur over the next 12 hours:  Shaking chills.  You have a temperature by mouth above 102 F (38.9 C), not controlled by medicine.  Chest, back, or muscle pain.  People around you feel you are not acting correctly or are confused.  Shortness of breath or difficulty breathing.  Dizziness and fainting.  You get a rash or develop hives.  You have a decrease in urine output.  Your urine turns a dark color or changes to pink, red, or brown. Any of the following symptoms occur over the next 10 days:  You have a temperature by mouth above 102 F (38.9 C), not controlled by medicine.  Shortness of breath.  Weakness after normal activity.  The white part of the eye turns yellow (jaundice).  You have a decrease in the amount of urine or are urinating less often.  Your urine turns a dark color or changes to pink, red, or brown. Document Released: 06/13/2000 Document Revised: 09/08/2011 Document Reviewed: 01/31/2008 ExitCare Patient Information 2014  [place].  _______________________________________________________________________  Incentive Spirometer  An incentive spirometer is a tool that can help keep your lungs clear and active. This tool measures how well you are filling your lungs with each breath. Taking long deep breaths may help reverse or decrease the chance of developing breathing (pulmonary) problems (especially infection) following:  A long period of time when you are unable to move or be active. BEFORE THE PROCEDURE   If the spirometer includes an indicator to show your best effort, your nurse or respiratory therapist will set it to a desired goal.  If possible, sit up straight or lean slightly forward. Try not to slouch.  Hold the incentive spirometer in an upright position. INSTRUCTIONS FOR USE  3. Sit on the edge of your bed if possible, or sit up as far as you can in bed or on a chair. 4. Hold the incentive spirometer in an upright position. 5. Breathe out normally. 6. Place the mouthpiece in your mouth and seal your lips tightly around it. 7. Breathe in slowly and as deeply as possible, raising the piston or the ball toward the top of the column. 8. Hold your breath for 3-5 seconds or for as long as possible. Allow the piston or ball to fall to the bottom of the column. 9. Remove the mouthpiece from your mouth and breathe out normally. 10. Rest for a few seconds and repeat Steps 1 through 7 at least 10 times every 1-2 hours when you are awake. Take your time and take a few normal breaths between deep breaths. 11. The spirometer may include an indicator to show your best effort. Use the indicator as a goal to work  toward during each repetition. 12. After each set of 10 deep breaths, practice coughing to be sure your lungs are clear. If you have an incision (the cut made at the time of surgery), support your incision when coughing by placing a pillow or rolled up towels firmly against it. Once you are able to  get out of bed, walk around indoors and cough well. You may stop using the incentive spirometer when instructed by your caregiver.  RISKS AND COMPLICATIONS  Take your time so you do not get dizzy or light-headed.  If you are in pain, you may need to take or ask for pain medication before doing incentive spirometry. It is harder to take a deep breath if you are having pain. AFTER USE  Rest and breathe slowly and easily.  It can be helpful to keep track of a log of your progress. Your caregiver can provide you with a simple table to help with this. If you are using the spirometer at home, follow these instructions: [place] IF:   You are having difficultly using the spirometer.  You have trouble using the spirometer as often as instructed.  Your pain medication is not giving enough relief while using the spirometer.  You develop fever of 100.5 F (38.1 C) or higher. SEEK IMMEDIATE MEDICAL CARE IF:   You cough up bloody sputum that had not been present before.  You develop fever of 102 F (38.9 C) or greater.  You develop worsening pain at or near the incision site. MAKE SURE YOU:   Understand these instructions.  Will watch your condition.  Will get help right away if you are not doing well or get worse. Document Released: 10/27/2006 Document Revised: 09/08/2011 Document Reviewed: 12/28/2006 ExitCare Patient Information 2014 ExitCare, [place].   ________________________________________________________________________    CLEAR LIQUID DIET   Foods Allowed                                                                     Foods Excluded  Coffee and tea, regular and decaf                             liquids that you cannot  Plain Jell-O in any flavor                                             see through such as: Fruit ices (not with fruit pulp)                                     milk, soups, orange juice  Iced Popsicles                                    All  solid food  Cranberry, grape and apple juices Sports drinks like Gatorade Lightly seasoned clear broth or consume(fat free) Sugar, honey syrup  Sample Menu Breakfast                                Lunch                                     Supper Cranberry juice                    Beef broth                            Chicken broth Jell-O                                     Grape juice                           Apple juice Coffee or tea                        Jell-O                                      Popsicle                                                Coffee or tea                        Coffee or tea  _____________________________________________________________________

## 2015-08-13 ENCOUNTER — Encounter: Payer: Self-pay | Admitting: Gynecologic Oncology

## 2015-08-14 ENCOUNTER — Ambulatory Visit (HOSPITAL_COMMUNITY): Payer: Federal, State, Local not specified - PPO | Admitting: Anesthesiology

## 2015-08-14 ENCOUNTER — Ambulatory Visit (HOSPITAL_COMMUNITY)
Admission: RE | Admit: 2015-08-14 | Discharge: 2015-08-15 | Disposition: A | Discharge status: Home / Self Care | Payer: Federal, State, Local not specified - PPO | Source: Ambulatory Visit | Attending: Gynecologic Oncology | Admitting: Gynecologic Oncology

## 2015-08-14 ENCOUNTER — Encounter (HOSPITAL_COMMUNITY): Payer: Self-pay | Admitting: *Deleted

## 2015-08-14 ENCOUNTER — Encounter (HOSPITAL_COMMUNITY)
Admission: RE | Disposition: A | Discharge status: Home / Self Care | Payer: Self-pay | Source: Ambulatory Visit | Attending: Gynecologic Oncology

## 2015-08-14 DIAGNOSIS — Z85828 Personal history of other malignant neoplasm of skin: Secondary | ICD-10-CM | POA: Insufficient documentation

## 2015-08-14 DIAGNOSIS — F419 Anxiety disorder, unspecified: Secondary | ICD-10-CM | POA: Diagnosis not present

## 2015-08-14 DIAGNOSIS — C541 Malignant neoplasm of endometrium: Secondary | ICD-10-CM | POA: Diagnosis present

## 2015-08-14 DIAGNOSIS — J45909 Unspecified asthma, uncomplicated: Secondary | ICD-10-CM | POA: Insufficient documentation

## 2015-08-14 DIAGNOSIS — Z79899 Other long term (current) drug therapy: Secondary | ICD-10-CM | POA: Insufficient documentation

## 2015-08-14 DIAGNOSIS — E039 Hypothyroidism, unspecified: Secondary | ICD-10-CM | POA: Diagnosis not present

## 2015-08-14 DIAGNOSIS — K219 Gastro-esophageal reflux disease without esophagitis: Secondary | ICD-10-CM | POA: Diagnosis not present

## 2015-08-14 DIAGNOSIS — M8580 Other specified disorders of bone density and structure, unspecified site: Secondary | ICD-10-CM | POA: Diagnosis not present

## 2015-08-14 DIAGNOSIS — M79661 Pain in right lower leg: Secondary | ICD-10-CM

## 2015-08-14 HISTORY — PX: ROBOTIC ASSISTED TOTAL HYSTERECTOMY WITH BILATERAL SALPINGO OOPHERECTOMY: SHX6086

## 2015-08-14 LAB — TYPE AND SCREEN
ABO/RH(D): O POS
Antibody Screen: NEGATIVE

## 2015-08-14 SURGERY — HYSTERECTOMY, TOTAL, ROBOT-ASSISTED, LAPAROSCOPIC, WITH BILATERAL SALPINGO-OOPHORECTOMY
Anesthesia: General | Laterality: Bilateral

## 2015-08-14 MED ORDER — LACTATED RINGERS IV SOLN
INTRAVENOUS | Status: DC
  Administered 2015-08-14 (×2): via INTRAVENOUS

## 2015-08-14 MED ORDER — HYDROMORPHONE HCL 1 MG/ML IJ SOLN
INTRAMUSCULAR | Status: AC
  Filled 2015-08-14: qty 1

## 2015-08-14 MED ORDER — PROPOFOL 10 MG/ML IV BOLUS
INTRAVENOUS | Status: AC
  Filled 2015-08-14: qty 20

## 2015-08-14 MED ORDER — ENOXAPARIN SODIUM 40 MG/0.4ML ~~LOC~~ SOLN
40.0000 mg | SUBCUTANEOUS | Status: DC
  Filled 2015-08-14 (×2): qty 0.4

## 2015-08-14 MED ORDER — CLINDAMYCIN PHOSPHATE 900 MG/50ML IV SOLN
900.0000 mg | INTRAVENOUS | Status: AC
  Administered 2015-08-14: 900 mg via INTRAVENOUS

## 2015-08-14 MED ORDER — ONDANSETRON HCL 4 MG/2ML IJ SOLN
INTRAMUSCULAR | Status: AC
  Filled 2015-08-14: qty 2

## 2015-08-14 MED ORDER — DEXAMETHASONE SODIUM PHOSPHATE 10 MG/ML IJ SOLN
INTRAMUSCULAR | Status: DC | PRN
  Administered 2015-08-14: 10 mg via INTRAVENOUS

## 2015-08-14 MED ORDER — LIDOCAINE HCL (CARDIAC) 20 MG/ML IV SOLN
INTRAVENOUS | Status: AC
  Filled 2015-08-14: qty 5

## 2015-08-14 MED ORDER — NEOSTIGMINE METHYLSULFATE 10 MG/10ML IV SOLN
INTRAVENOUS | Status: AC
  Filled 2015-08-14: qty 1

## 2015-08-14 MED ORDER — LIOTHYRONINE SODIUM 5 MCG PO TABS
10.0000 ug | ORAL_TABLET | ORAL | Status: DC
  Filled 2015-08-14: qty 2

## 2015-08-14 MED ORDER — FENTANYL CITRATE (PF) 250 MCG/5ML IJ SOLN
INTRAMUSCULAR | Status: DC | PRN
  Administered 2015-08-14: 50 ug via INTRAVENOUS
  Administered 2015-08-14: 100 ug via INTRAVENOUS
  Administered 2015-08-14 (×2): 50 ug via INTRAVENOUS

## 2015-08-14 MED ORDER — ALPRAZOLAM 0.5 MG PO TABS: 0.5000 mg | ORAL_TABLET | Freq: Every evening | ORAL | Status: DC | PRN

## 2015-08-14 MED ORDER — GLYCOPYRROLATE 0.2 MG/ML IJ SOLN
INTRAMUSCULAR | Status: AC
  Filled 2015-08-14: qty 3

## 2015-08-14 MED ORDER — ONDANSETRON HCL 4 MG PO TABS: 4.0000 mg | ORAL_TABLET | Freq: Four times a day (QID) | ORAL | Status: DC | PRN

## 2015-08-14 MED ORDER — CIPROFLOXACIN IN D5W 400 MG/200ML IV SOLN
INTRAVENOUS | Status: AC
  Filled 2015-08-14: qty 200

## 2015-08-14 MED ORDER — PROPOFOL 10 MG/ML IV BOLUS
INTRAVENOUS | Status: DC | PRN
  Administered 2015-08-14: 150 mg via INTRAVENOUS

## 2015-08-14 MED ORDER — KETOROLAC TROMETHAMINE 15 MG/ML IJ SOLN
15.0000 mg | Freq: Four times a day (QID) | INTRAMUSCULAR | Status: AC | PRN
  Administered 2015-08-14: 15 mg via INTRAVENOUS
  Filled 2015-08-14: qty 1

## 2015-08-14 MED ORDER — FENTANYL CITRATE (PF) 250 MCG/5ML IJ SOLN
INTRAMUSCULAR | Status: AC
  Filled 2015-08-14: qty 5

## 2015-08-14 MED ORDER — LEVOTHYROXINE SODIUM 50 MCG PO TABS: 50.0000 ug | ORAL_TABLET | ORAL | Status: DC

## 2015-08-14 MED ORDER — STERILE WATER FOR INJECTION IJ SOLN
INTRAMUSCULAR | Status: AC
  Filled 2015-08-14: qty 10

## 2015-08-14 MED ORDER — ONDANSETRON HCL 4 MG/2ML IJ SOLN
INTRAMUSCULAR | Status: DC | PRN
  Administered 2015-08-14: 4 mg via INTRAVENOUS

## 2015-08-14 MED ORDER — ONDANSETRON HCL 4 MG/2ML IJ SOLN
4.0000 mg | Freq: Four times a day (QID) | INTRAMUSCULAR | Status: DC | PRN
  Administered 2015-08-14: 4 mg via INTRAVENOUS
  Filled 2015-08-14: qty 2

## 2015-08-14 MED ORDER — LACTATED RINGERS IR SOLN
Status: DC | PRN
  Administered 2015-08-14: 1

## 2015-08-14 MED ORDER — HYDROMORPHONE HCL 1 MG/ML IJ SOLN
0.2500 mg | INTRAMUSCULAR | Status: DC | PRN
  Administered 2015-08-14 (×4): 0.5 mg via INTRAVENOUS

## 2015-08-14 MED ORDER — SUGAMMADEX SODIUM 200 MG/2ML IV SOLN
INTRAVENOUS | Status: AC
  Filled 2015-08-14: qty 2

## 2015-08-14 MED ORDER — CLINDAMYCIN PHOSPHATE 900 MG/50ML IV SOLN
INTRAVENOUS | Status: AC
  Filled 2015-08-14: qty 50

## 2015-08-14 MED ORDER — ACETAMINOPHEN 160 MG/5ML PO SOLN: 325.0000 mg | ORAL | Status: DC | PRN

## 2015-08-14 MED ORDER — LIDOCAINE HCL (CARDIAC) 20 MG/ML IV SOLN
INTRAVENOUS | Status: DC | PRN
  Administered 2015-08-14: 80 mg via INTRATRACHEAL

## 2015-08-14 MED ORDER — KCL IN DEXTROSE-NACL 20-5-0.45 MEQ/L-%-% IV SOLN
INTRAVENOUS | Status: DC
  Administered 2015-08-14: 1000 mL via INTRAVENOUS
  Filled 2015-08-14 (×3): qty 1000

## 2015-08-14 MED ORDER — LIOTHYRONINE SODIUM 5 MCG PO TABS
5.0000 ug | ORAL_TABLET | Freq: Every day | ORAL | Status: DC
  Administered 2015-08-15: 5 ug via ORAL
  Filled 2015-08-14 (×2): qty 1

## 2015-08-14 MED ORDER — GABAPENTIN 300 MG PO CAPS
600.0000 mg | ORAL_CAPSULE | Freq: Every day | ORAL | Status: AC
  Administered 2015-08-14: 600 mg via ORAL
  Filled 2015-08-14: qty 2

## 2015-08-14 MED ORDER — OXYCODONE HCL 5 MG PO TABS: 5.0000 mg | ORAL_TABLET | Freq: Once | ORAL | Status: DC | PRN

## 2015-08-14 MED ORDER — MIDAZOLAM HCL 2 MG/2ML IJ SOLN
INTRAMUSCULAR | Status: AC
  Filled 2015-08-14: qty 2

## 2015-08-14 MED ORDER — ROCURONIUM BROMIDE 100 MG/10ML IV SOLN
INTRAVENOUS | Status: AC
  Filled 2015-08-14: qty 1

## 2015-08-14 MED ORDER — SUGAMMADEX SODIUM 200 MG/2ML IV SOLN
INTRAVENOUS | Status: DC | PRN
Start: 2015-08-14 — End: 2015-08-14
  Administered 2015-08-14: 150 mg via INTRAVENOUS

## 2015-08-14 MED ORDER — DEXAMETHASONE SODIUM PHOSPHATE 10 MG/ML IJ SOLN
INTRAMUSCULAR | Status: AC
  Filled 2015-08-14: qty 1

## 2015-08-14 MED ORDER — ARTIFICIAL TEARS OP OINT
TOPICAL_OINTMENT | OPHTHALMIC | Status: AC
  Filled 2015-08-14: qty 3.5

## 2015-08-14 MED ORDER — ROCURONIUM BROMIDE 100 MG/10ML IV SOLN
INTRAVENOUS | Status: DC | PRN
  Administered 2015-08-14: 10 mg via INTRAVENOUS
  Administered 2015-08-14: 50 mg via INTRAVENOUS

## 2015-08-14 MED ORDER — MIDAZOLAM HCL 5 MG/5ML IJ SOLN
INTRAMUSCULAR | Status: DC | PRN
  Administered 2015-08-14: 2 mg via INTRAVENOUS

## 2015-08-14 MED ORDER — ACETAMINOPHEN 325 MG PO TABS: 325.0000 mg | ORAL_TABLET | ORAL | Status: DC | PRN

## 2015-08-14 MED ORDER — OXYCODONE-ACETAMINOPHEN 5-325 MG PO TABS
1.0000 | ORAL_TABLET | ORAL | Status: DC | PRN
  Administered 2015-08-14: 2 via ORAL
  Administered 2015-08-14: 1 via ORAL
  Filled 2015-08-14: qty 2
  Filled 2015-08-14: qty 1

## 2015-08-14 MED ORDER — CIPROFLOXACIN IN D5W 400 MG/200ML IV SOLN
400.0000 mg | INTRAVENOUS | Status: AC
  Administered 2015-08-14: 400 mg via INTRAVENOUS

## 2015-08-14 MED ORDER — ENOXAPARIN SODIUM 40 MG/0.4ML ~~LOC~~ SOLN
40.0000 mg | SUBCUTANEOUS | Status: AC
  Administered 2015-08-14: 40 mg via SUBCUTANEOUS
  Filled 2015-08-14: qty 0.4

## 2015-08-14 MED ORDER — LEVOTHYROXINE SODIUM 25 MCG PO TABS
25.0000 ug | ORAL_TABLET | ORAL | Status: DC
  Administered 2015-08-15: 25 ug via ORAL
  Filled 2015-08-14 (×3): qty 1

## 2015-08-14 MED ORDER — OXYCODONE HCL 5 MG/5ML PO SOLN
5.0000 mg | Freq: Once | ORAL | Status: DC | PRN
  Filled 2015-08-14: qty 5

## 2015-08-14 MED ORDER — HYDROMORPHONE HCL 1 MG/ML IJ SOLN: 0.2000 mg | INTRAMUSCULAR | Status: DC | PRN

## 2015-08-14 SURGICAL SUPPLY — 50 items
APL ESCP 34 STRL LF DISP (HEMOSTASIS)
APPLICATOR SURGIFLO ENDO (HEMOSTASIS) IMPLANT
BAG SPEC RTRVL LRG 6X4 10 (ENDOMECHANICALS)
CHLORAPREP W/TINT 26ML (MISCELLANEOUS) ×2 IMPLANT
COVER SURGICAL LIGHT HANDLE (MISCELLANEOUS) ×2 IMPLANT
COVER TIP SHEARS 8 DVNC (MISCELLANEOUS) ×1 IMPLANT
COVER TIP SHEARS 8MM DA VINCI (MISCELLANEOUS) ×1
DRAPE ARM DVNC X/XI (DISPOSABLE) ×4 IMPLANT
DRAPE COLUMN DVNC XI (DISPOSABLE) ×1 IMPLANT
DRAPE DA VINCI XI ARM (DISPOSABLE) ×4
DRAPE DA VINCI XI COLUMN (DISPOSABLE) ×1
DRAPE SHEET LG 3/4 BI-LAMINATE (DRAPES) ×4 IMPLANT
DRAPE SURG IRRIG POUCH 19X23 (DRAPES) ×2 IMPLANT
ELECT REM PT RETURN 9FT ADLT (ELECTROSURGICAL) ×2
ELECTRODE REM PT RTRN 9FT ADLT (ELECTROSURGICAL) ×1 IMPLANT
GLOVE BIO SURGEON STRL SZ 6 (GLOVE) ×8 IMPLANT
GLOVE BIO SURGEON STRL SZ 6.5 (GLOVE) ×4 IMPLANT
GOWN STRL REUS W/ TWL LRG LVL3 (GOWN DISPOSABLE) ×2 IMPLANT
GOWN STRL REUS W/TWL LRG LVL3 (GOWN DISPOSABLE) ×4
HOLDER FOLEY CATH W/STRAP (MISCELLANEOUS) ×2 IMPLANT
KIT BASIN OR (CUSTOM PROCEDURE TRAY) ×2 IMPLANT
LIQUID BAND (GAUZE/BANDAGES/DRESSINGS) ×2 IMPLANT
MANIPULATOR UTERINE 4.5 ZUMI (MISCELLANEOUS) ×2 IMPLANT
MARKER SKIN DUAL TIP RULER LAB (MISCELLANEOUS) ×2 IMPLANT
OBTURATOR XI 8MM BLADELESS (TROCAR) ×2 IMPLANT
OCCLUDER COLPOPNEUMO (BALLOONS) ×2 IMPLANT
PAD POSITIONING PINK XL (MISCELLANEOUS) ×2 IMPLANT
PORT ACCESS TROCAR AIRSEAL 12 (TROCAR) ×1 IMPLANT
PORT ACCESS TROCAR AIRSEAL 5M (TROCAR) ×1
POUCH ENDO CATCH II 15MM (MISCELLANEOUS) IMPLANT
POUCH SPECIMEN RETRIEVAL 10MM (ENDOMECHANICALS) IMPLANT
SEAL CANN UNIV 5-8 DVNC XI (MISCELLANEOUS) ×4 IMPLANT
SEAL XI 5MM-8MM UNIVERSAL (MISCELLANEOUS) ×4
SET TRI-LUMEN FLTR TB AIRSEAL (TUBING) ×2 IMPLANT
SET TUBE IRRIG SUCTION NO TIP (IRRIGATION / IRRIGATOR) ×2 IMPLANT
SHEET LAVH (DRAPES) ×2 IMPLANT
SOLUTION ELECTROLUBE (MISCELLANEOUS) ×2 IMPLANT
SURGIFLO W/THROMBIN 8M KIT (HEMOSTASIS) IMPLANT
SUT MNCRL AB 4-0 PS2 18 (SUTURE) ×4 IMPLANT
SUT VIC AB 0 CT1 27 (SUTURE) ×2
SUT VIC AB 0 CT1 27XBRD ANTBC (SUTURE) ×1 IMPLANT
SYR 50ML LL SCALE MARK (SYRINGE) ×2 IMPLANT
TOWEL OR 17X26 10 PK STRL BLUE (TOWEL DISPOSABLE) ×4 IMPLANT
TOWEL OR NON WOVEN STRL DISP B (DISPOSABLE) ×2 IMPLANT
TRAP SPECIMEN MUCOUS 40CC (MISCELLANEOUS) IMPLANT
TRAY FOLEY W/METER SILVER 14FR (SET/KITS/TRAYS/PACK) ×2 IMPLANT
TRAY LAPAROSCOPIC (CUSTOM PROCEDURE TRAY) ×2 IMPLANT
TROCAR BLADELESS OPT 5 100 (ENDOMECHANICALS) ×2 IMPLANT
TROCAR XCEL 12X100 BLDLESS (ENDOMECHANICALS) ×2 IMPLANT
WATER STERILE IRR 1500ML POUR (IV SOLUTION) ×4 IMPLANT

## 2015-08-14 NOTE — Op Note (Signed)
OPERATIVE NOTE 08/14/15  Surgeon: Donaciano Eva   Assistants: Dr Lahoma Crocker (an MD assistant was necessary for tissue manipulation, management of robotic instrumentation, retraction and positioning due to the complexity of the case and hospital policies).   Anesthesia: General endotracheal anesthesia  ASA Class: 2   Pre-operative Diagnosis: grade 2 endometrial cancer  Post-operative Diagnosis: same  Operation: Robotic-assisted laparoscopic hysterectomy with bilateral salpingoophorectomy, sentinel lymph node mapping  Surgeon: Donaciano Eva  Assistant Surgeon: Lahoma Crocker MD  Anesthesia: GET  Urine Output: 200  Operative Findings:  : 6 week size retroverted uterus, no suspicious nodes or evidence of extrauterine disease.  Estimated Blood Loss:  <20cc      Total IV Fluids: 400 ml         Specimens: right external iliac SLN, right obturator SLN, left external iliac SLN, uterus, cervix, bilateral tubes and ovaries, washings         Complications:  None; patient tolerated the procedure well.         Disposition: PACU - hemodynamically stable.  Procedure Details  The patient was seen in the Holding Room. The risks, benefits, complications, treatment options, and expected outcomes were discussed with the patient.  The patient concurred with the proposed plan, giving informed consent.  The site of surgery properly noted/marked. The patient was identified as [name] and the procedure verified as a Robotic-assisted hysterectomy with bilateral salpingo oophorectomy. A Time Out was held and the above information confirmed.  After induction of anesthesia, the patient was draped and prepped in the usual sterile manner. Pt was placed in supine position after anesthesia and draped and prepped in the usual sterile manner. The abdominal drape was placed after the CholoraPrep had been allowed to dry for 3 minutes.  Her arms were tucked to her side with all  appropriate precautions.  The shoulders were stabilized with padded shoulder blocks applied to the acromium processes.  The patient was placed in the semi-lithotomy position in [place].  The perineum was prepped with Betadine.  Foley catheter was placed.  A sterile speculum was placed in the vagina.  The cervix was grasped with a single-tooth tenaculum and dilated with Kennon Rounds dilators.  1mg  total of ICG was injected into the cervical stroma at 2 and 9 o'clock at a 15mm depth (concentration 0..5mg /ml). The ZUMI uterine manipulator with a medium colpotomizer ring was placed without difficulty.  A pneum occluder balloon was placed over the manipulator.  A second time-out was performed.  OG tube placement was confirmed and to suction.    Procedure:  The patient was then draped in the normal manner.  Next, a 5 mm skin incision was made 1 cm below the subcostal margin in the midclavicular line.  The 5 mm Optiview port and scope was used for direct entry.  Opening pressure was under 10 mm CO2.  The abdomen was insufflated and the findings were noted as above.   At this point and all points during the procedure, the patient's intra-abdominal pressure did not exceed 15 mmHg. Next, a 10 mm skin incision was made in the umbilicus and a right and left port was placed about 10 cm lateral to the robot port on the right and left side.  A fourth arm was placed in the left lower quadrant 2 cm above and superior and medial to the anterior superior iliac spine.  All ports were placed under direct visualization.  The patient was placed in steep Trendelenburg.  Bowel was  away into the upper abdomen.  The robot was docked in the normal manner.  The right and left peritoneum were opened parallel to the IP ligament to open the retroperitoneal spaces bilaterally. The SLN mapping was performed in bilateral pelvic basins. The para rectal and paravesical spaces were opened up. Lymphatic channels were identified travelling to the  following visualized sentinel lymph node's: right external iliac and obturator, left external iliac. These SLN's were separated from their surrounding lymphatic tissue, removed and sent for permanent pathology.  The hysterectomy was started after the round ligament on the right side was incised and the retroperitoneum was entered and the pararectal space was developed.  The ureter was noted to be on the medial leaf of the broad ligament.  The peritoneum above the ureter was incised and stretched and the infundibulopelvic ligament was skeletonized, cauterized and cut.  The posterior peritoneum was taken down to the level of the KOH ring.  The anterior peritoneum was also taken down.  The bladder flap was created to the level of the KOH ring.  The uterine artery on the right side was skeletonized, cauterized and cut in the normal manner.  A similar procedure was performed on the left.  The colpotomy was made and the uterus, cervix, bilateral ovaries and tubes were amputated and delivered through the vagina.  Pedicles were inspected and excellent hemostasis was achieved.    The colpotomy at the vaginal cuff was closed with Vicryl on a CT1 needle in a running manner.  Irrigation was used and excellent hemostasis was achieved.  At this point in the procedure was completed.  Robotic instruments were removed under direct visulaization.  The robot was undocked. The 10 mm ports were closed with Vicryl on a UR-5 needle and the fascia was closed with 0 Vicryl on a UR-5 needle.  The skin was closed with 4-0 Vicryl in a subcuticular manner.  Dermabond was applied.  Sponge, lap and needle counts correct x 2.  The patient was taken to the recovery room in stable condition.  The vagina was swabbed with  minimal bleeding noted.   All instrument and needle counts were correct x  3.   The patient was transferred to the recovery room in a stable condition.   Donaciano Eva, MD

## 2015-08-14 NOTE — Transfer of Care (Signed)
Immediate Anesthesia Transfer of Care Note  Patient: [name]  Procedure(s) Performed: Procedure(s): XI ROBOTIC ASSISTED TOTAL HYSTERECTOMY WITH BILATERAL SALPINGO OOPHORECTOMY AND SENTAL LYMPH NODE BIOPSY (Bilateral)  Patient Location: PACU  Anesthesia Type:General  Level of Consciousness: awake and patient cooperative  Airway & Oxygen Therapy: Patient Spontanous Breathing and Patient connected to face mask oxygen  Post-op Assessment: Report given to RN and Post -op Vital signs reviewed and stable  Post vital signs: Reviewed and stable  Last Vitals:  Filed Vitals:   08/14/15 0925  BP: 109/57  Pulse: 68  Temp: 36.6 C  Resp: 18    Complications: No apparent anesthesia complications

## 2015-08-14 NOTE — Anesthesia Procedure Notes (Signed)
Procedure Name: Intubation Date/Time: 08/14/2015 12:03 PM Performed by: Dione Booze Pre-anesthesia Checklist: Emergency Drugs available, Suction available, Patient being monitored and Patient identified Patient Re-evaluated:Patient Re-evaluated prior to inductionOxygen Delivery Method: Circle system utilized Preoxygenation: Pre-oxygenation with 100% oxygen Intubation Type: IV induction Ventilation: Mask ventilation without difficulty Laryngoscope Size: Mac and 4 Grade View: Grade II Tube type: Oral Tube size: 7.5 mm Number of attempts: 1 Airway Equipment and Method: Stylet Placement Confirmation: ETT inserted through vocal cords under direct vision and positive ETCO2 Secured at: 21 cm Tube secured with: Tape Dental Injury: Teeth and Oropharynx as per pre-operative assessment

## 2015-08-14 NOTE — H&P (View-Only) (Signed)
Consult Note: Gyn-Onc  Consult was requested by Dr. Helane Rima for the evaluation of NYOTA BRUMMELL 60 y.o. female  CC:  Chief Complaint  Patient presents with  . endometrial adenocarcimona    New Consultation    Assessment/Plan:  Ms. ELISABET MUCCI  is a 60 y.o.  year old with grade 2 endometrioid endometrial cancer.   A detailed discussion was held with the patient and her family with regard to to her endometrial cancer diagnosis. We discussed the standard management options for uterine cancer which includes surgery followed possibly by adjuvant therapy depending on the results of surgery. The options for surgical management include a hysterectomy and removal of the tubes and ovaries possibly with removal of pelvic and para-aortic lymph nodes. A minimally invasive approach including a robotic hysterectomy or laparoscopic hysterectomy have benefits including shorter hospital stay, recovery time and better wound healing. The alternative approach is an open hysterectomy. The patient has been counseled about these surgical options and the risks of surgery in general including infection, bleeding, damage to surrounding structures (including bowel, bladder, ureters, nerves or vessels), and the postoperative risks of PE/ DVT, and lymphedema. I extensively reviewed the additional risks of robotic hysterectomy including possible need for conversion to open laparotomy.  I discussed positioning during surgery of trendelenberg and risks of minor facial swelling and care we take in preoperative positioning.  After counseling and consideration of her options, she desires to proceed with robotic assisted total hysterectomy, BSO, sentinel lymph node mapping.   She will be seen by anesthesia for preoperative clearance and discussion of postoperative pain management.  She was given the opportunity to ask questions, which were answered to her satisfaction, and she is agreement with the above mentioned plan of  care.  HPI: Francies Montagnino is a 60 year old G0 who is seen in consultation at the request of Dr Helane Rima for grade 2 endometrial cancer. The patient reports transitioning to menopause for early in life, approximate age 55. She had intermittent light vaginal spotting after intercourse from unknown submucosal fibroid over the years. He'll be monitored with ultrasound. She was treated for her pre-mature menopause with initially manufactured hormone replacement therapy and then eventually with custom compounded estradiol and progesterone.  In the fall of 2016 she began experiencing more frequent vaginal bleeding. Dr. Pearline Cables while performed transvaginal ultrasound 06/08/2015 which revealed a uterus measuring 7.3 x 4.5 x 5.5 cm. Endometrial thickness is 1.8 cm. Left and right ovaries were grossly normal. The endometrium contained a 18 x 16 mm submucosal fibroid with fetal vessel seen. She then underwent a subsequent D&C procedure on 12/17/2015. Pathology from this revealed endometrioid adenocarcinoma with a patent favoring FIGO grade 2.   She is nulliparous and has a history of infertility (primary). She has family history only for skin cancer (no colon or uterine).  Current Meds:  Outpatient Encounter Prescriptions as of 08/06/2015  Medication Sig  . Calcium Citrate-Vitamin D (CALCIUM CITRATE +D PO) Take by mouth daily.  . cholecalciferol (VITAMIN D) 1000 units tablet Take 2,000 Units by mouth daily.  Marland Kitchen EPIPEN 2-PAK 0.3 MG/0.3ML DEVI Inject 0.3 mg into the muscle once.   Marland Kitchen levothyroxine (SYNTHROID, LEVOTHROID) 25 MCG tablet Take 25-50 mcg by mouth as directed. 25 mcg 4 days per week, 50 mcg 3 days per week  . liothyronine (CYTOMEL) 5 MCG tablet Take 5-10 mcg by mouth See admin instructions. Takes 5 mcg in the morning and 10 mcg in the afternonooon  . MELATONIN-CHAMOMILE PO Take  1 capsule by mouth at bedtime as needed and may repeat dose one time if needed.  . MetroNIDAZOLE (METROLOTION EX) Apply topically.  .  minoxidil (ROGAINE) 2 % external solution Apply topically 2 (two) times daily.  . Multiple Vitamin (MULTIVITAMIN) tablet Take 1 tablet by mouth daily.  Marland Kitchen pyridoxine (B-6) 200 MG tablet Take 200 mg by mouth daily.  . TURMERIC PO Take 1 capsule by mouth daily.  Marland Kitchen ALPRAZolam (XANAX) 0.5 MG tablet Reported on 08/06/2015  . Chlorpheniramine Maleate (ALLERGY PO) Take by mouth. Reported on 08/06/2015  . ESTRADIOL TD Place 1.25 % onto the skin daily. Reported on 08/06/2015  . fluticasone (FLONASE) 50 MCG/ACT nasal spray as needed. Reported on 08/06/2015  . Misc Natural Products (PROGESTERONE EX) Apply topically daily. Reported on 08/06/2015  . PROAIR RESPICLICK 123XX123 (90 BASE) MCG/ACT AEPB as needed. Reported on 08/06/2015  . vitamin E 400 UNIT capsule Take 400 Units by mouth daily. Reported on 08/06/2015  . [DISCONTINUED] NEOMYCIN-POLYMYXIN-HYDROCORTISONE (CORTISPORIN) 1 % SOLN otic solution Apply 1-2 drops to toe BID after soaking   No facility-administered encounter medications on file as of 08/06/2015.    Allergy:  Allergies  Allergen Reactions  . Codeine Other (See Comments)    unknown  . Demerol Nausea And Vomiting  . Other Other (See Comments)    Shell Fish Stomach issues  . Penicillins Hives    Social Hx:   Social History   Social History  . Marital Status: Married    Spouse Name: N/A  . Number of Children: N/A  . Years of Education: N/A   Occupational History  . Not on file.   Social History Main Topics  . Smoking status: Never Smoker   . Smokeless tobacco: Never Used     Comment: Married, 1 child  . Alcohol Use: Yes  . Drug Use: No  . Sexual Activity: Yes   Other Topics Concern  . Not on file   Social History Narrative    Past Surgical Hx:  Past Surgical History  Procedure Laterality Date  . Mandib advancement forward  2009    Past Medical Hx:  Past Medical History  Diagnosis Date  . HYPOTHYROIDISM 11/12/2007    Dr Chalmers Cater  . ANXIETY 03/02/2007  . ALLERGIC RHINITIS  03/02/2007  . ASTHMA 11/12/2007    Dr Orvil Feil  . TMJ SYNDROME 11/12/2007  . GERD 03/02/2007  . PEPTIC ULCER DISEASE 03/02/2007  . OSTEOPENIA 11/12/2007  . FIBROCYSTIC BREAST DISEASE, HX OF 03/02/2007  . Cancer (HCC)     skin, hx of  . Anemia     Past Gynecological History:  G0  No LMP recorded.  Family Hx:  Family History  Problem Relation Age of Onset  . Asthma Neg Hx   . Hypertension Other   . Hypertension Father   . Heart disease Father 67    chf  . Heart disease Mother 47    CAD    Review of Systems:  Constitutional  Feels well,    ENT Normal appearing ears and nares bilaterally Skin/Breast  No rash, sores, jaundice, itching, dryness Cardiovascular  No chest pain, shortness of breath, or edema  Pulmonary  No cough or wheeze.  Gastro Intestinal  No nausea, vomitting, or diarrhoea. No bright red blood per rectum, no abdominal pain, change in bowel movement, or constipation.  Genito Urinary  No frequency, urgency, dysuria, + postmenopausal bleeding Musculo Skeletal  No myalgia, arthralgia, joint swelling or pain  Neurologic  No weakness, numbness, change  in gait,  Psychology  No depression, anxiety, insomnia.   Vitals:  Blood pressure 127/62, pulse 78, temperature 98.1 F (36.7 C), temperature source Oral, resp. rate 18, height 5\' 4"  (1.626 m), weight 135 lb 4.8 oz (61.372 kg), SpO2 99 %.  Physical Exam: WD in NAD Neck  Supple NROM, without any enlargements.  Lymph Node Survey No cervical supraclavicular or inguinal adenopathy Cardiovascular  Pulse normal rate, regularity and rhythm. S1 and S2 normal.  Lungs  Clear to auscultation bilateraly, without wheezes/crackles/rhonchi. Good air movement.  Skin  No rash/lesions/breakdown  Psychiatry  Alert and oriented to person, place, and time  Abdomen  Normoactive bowel sounds, abdomen soft, non-tender and thin without evidence of hernia.  Back No CVA tenderness Genito Urinary  Vulva/vagina: Normal external female  genitalia.  No lesions. No discharge or bleeding.  Bladder/urethra:  No lesions or masses, well supported bladder  Vagina: nomral  Cervix: Normal appearing, no lesions.  Uterus:  Small, mobile, no parametrial involvement or nodularity.  Adnexa: no palpable masses. Rectal  deferred.  Extremities  No bilateral cyanosis, clubbing or edema.   Donaciano Eva, MD  08/06/2015, 11:10 AM

## 2015-08-14 NOTE — Interval H&P Note (Signed)
History and Physical Interval Note:  08/14/2015 10:50 AM  [name]  has presented today for surgery, with the diagnosis of endometrial cancer  The various methods of treatment have been discussed with the patient and family. After consideration of risks, benefits and other options for treatment, the patient has consented to  Procedure(s): XI ROBOTIC ASSISTED TOTAL HYSTERECTOMY WITH BILATERAL SALPINGO OOPHORECTOMY AND SENTAL LYMPH NODE BIOPSY (Bilateral) as a surgical intervention .  The patient's history has been reviewed, patient examined, no change in status, stable for surgery.  I have reviewed the patient's chart and labs.  Questions were answered to the patient's satisfaction.     Donaciano Eva

## 2015-08-14 NOTE — Anesthesia Preprocedure Evaluation (Signed)
Anesthesia Evaluation  Patient identified by MRN, date of birth, ID band Patient awake    Reviewed: Allergy & Precautions, NPO status , Patient's Chart, lab work & pertinent test results  History of Anesthesia Complications Negative for: history of anesthetic complications  Airway Mallampati: II  TM Distance: >3 FB Neck ROM: Full    Dental  (+) Teeth Intact   Pulmonary neg shortness of breath, asthma , neg sleep apnea, neg COPD, neg recent URI, neg PE   breath sounds clear to auscultation- rhonchi       Cardiovascular negative cardio ROS   Rhythm:Regular     Neuro/Psych Anxiety  Neuromuscular disease    GI/Hepatic Neg liver ROS, PUD, GERD  Medicated and Controlled,  Endo/Other  Hypothyroidism   Renal/GU negative Renal ROS     Musculoskeletal   Abdominal   Peds  Hematology   Anesthesia Other Findings   Reproductive/Obstetrics                             Anesthesia Physical Anesthesia Plan  ASA: II  Anesthesia Plan: General   Post-op Pain Management:    Induction: Intravenous  Airway Management Planned: Oral ETT  Additional Equipment: None  Intra-op Plan:   Post-operative Plan: Extubation in OR  Informed Consent: I have reviewed the patients History and Physical, chart, labs and discussed the procedure including the risks, benefits and alternatives for the proposed anesthesia with the patient or authorized representative who has indicated his/her understanding and acceptance.   Dental advisory given  Plan Discussed with: CRNA and Surgeon  Anesthesia Plan Comments:         Anesthesia Quick Evaluation

## 2015-08-15 ENCOUNTER — Ambulatory Visit (HOSPITAL_BASED_OUTPATIENT_CLINIC_OR_DEPARTMENT_OTHER): Payer: Federal, State, Local not specified - PPO

## 2015-08-15 DIAGNOSIS — M79661 Pain in right lower leg: Secondary | ICD-10-CM | POA: Diagnosis not present

## 2015-08-15 DIAGNOSIS — C541 Malignant neoplasm of endometrium: Secondary | ICD-10-CM | POA: Diagnosis not present

## 2015-08-15 LAB — BASIC METABOLIC PANEL
Anion gap: 7 (ref 5–15)
BUN: 14 mg/dL (ref 6–20)
CO2: 25 mmol/L (ref 22–32)
Calcium: 8.7 mg/dL — ABNORMAL LOW (ref 8.9–10.3)
Chloride: 105 mmol/L (ref 101–111)
Creatinine, Ser: 0.84 mg/dL (ref 0.44–1.00)
GFR calc Af Amer: >60 mL/min (ref >60)
GFR calc non Af Amer: >60 mL/min (ref >60)
Glucose, Bld: 153 mg/dL — ABNORMAL HIGH (ref 65–99)
Potassium: 4.1 mmol/L (ref 3.5–5.1)
Sodium: 137 mmol/L (ref 135–145)

## 2015-08-15 LAB — CBC
HCT: 35.8 % — ABNORMAL LOW (ref 36.0–46.0)
Hemoglobin: 12.3 g/dL (ref 12.0–15.0)
MCH: 31.2 pg (ref 26.0–34.0)
MCHC: 34.4 g/dL (ref 30.0–36.0)
MCV: 90.9 fL (ref 78.0–100.0)
Platelets: 223 10*3/uL (ref 150–400)
RBC: 3.94 MIL/uL (ref 3.87–5.11)
RDW: 12.7 % (ref 11.5–15.5)
WBC: 7.1 10*3/uL (ref 4.0–10.5)

## 2015-08-15 NOTE — Care Management Note (Signed)
Case Management Note  Patient Details  Name: ARADHYA DUSEL MRN: [id number] Date of Birth: 07/13/1955  Subjective/Objective:  Robotic-assisted laparoscopic hysterectomy with bilateral salpingoophorectomy, sentinel lymph node mapping                  Action/Plan: Discharge planning, no HH needs identified  Expected Discharge Date:  08/16/15               Expected Discharge Plan:  Home/Self Care  In-House Referral:  NA  Discharge planning Services  CM Consult  Post Acute Care Choice:  NA Choice offered to:  NA  DME Arranged:  N/A DME Agency:  NA  HH Arranged:  NA HH Agency:  NA  Status of Service:  Completed, signed off  Medicare Important Message Given:    Date Medicare IM Given:    Medicare IM give by:    Date Additional Medicare IM Given:    Additional Medicare Important Message give by:     If discussed at [place] of Stay Meetings, dates discussed:    Additional Comments:  Guadalupe Maple, RN 08/15/2015, 10:26 AM [phone]

## 2015-08-15 NOTE — Discharge Summary (Signed)
Physician Discharge Summary  Patient ID: [name] MRN: [id number] DOB/AGE: [birth year] 60 y.o.  Admit date: 08/14/2015 Discharge date: 08/15/2015  Admission Diagnoses: Endometrial adenocarcinoma [facility])  Discharge Diagnoses:  Principal Problem:   Endometrial adenocarcinoma [facility]) Active Problems:   Endometrial cancer [facility])   Discharged Condition:  The patient is in good condition and stable for discharge.    Hospital Course: On 08/14/2015, the patient underwent the following: Procedure(s): XI ROBOTIC ASSISTED TOTAL HYSTERECTOMY WITH BILATERAL SALPINGO OOPHORECTOMY AND SENTAL LYMPH NODE BIOPSY.  The postoperative course was uneventful except for right calf pain noted the am of POD 1.  A doppler of the right extremity was ordered to rule out DVT and was negative.  She was discharged to home on postoperative day 1 tolerating a regular diet, voiding, minimal pain.  Consults: None  Significant Diagnostic Studies: None  Treatments: surgery: see above  Discharge Exam: Blood pressure 105/56, pulse 76, temperature 99.1 F (37.3 C), temperature source Oral, resp. rate 16, SpO2 98 %. General appearance: alert, cooperative and no distress Resp: clear to auscultation bilaterally Cardio: regular rate and rhythm, S1, S2 normal, no murmur, click, rub or gallop GI: soft, non-tender; bowel sounds normal; no masses,  no organomegaly Extremities: Right calf pain, tender to the touch, +Homans sign of the RLE, no erythema or increased warmth, no edema bilaterally Incision/Wound: Lap sites with dermabond without erythema or drainage  Disposition: 01-Home or Self Care      Discharge Instructions    Call MD for:  difficulty breathing, headache or visual disturbances    Complete by:  As directed      Call MD for:  extreme fatigue    Complete by:  As directed      Call MD for:  hives    Complete by:  As directed      Call MD for:  persistant dizziness or light-headedness    Complete by:  As  directed      Call MD for:  persistant nausea and vomiting    Complete by:  As directed      Call MD for:  redness, tenderness, or signs of infection (pain, swelling, redness, odor or green/yellow discharge around incision site)    Complete by:  As directed      Call MD for:  severe uncontrolled pain    Complete by:  As directed      Call MD for:  temperature >100.4    Complete by:  As directed      Diet - low sodium heart healthy    Complete by:  As directed      Driving Restrictions    Complete by:  As directed   No driving for 1 week.  Do not take narcotics and drive.     Increase activity slowly    Complete by:  As directed      Lifting restrictions    Complete by:  As directed   No lifting greater than 10 lbs.     Sexual Activity Restrictions    Complete by:  As directed   No sexual activity, nothing in the vagina, for 6 weeks.            Medication List    TAKE these medications        ALLERGY PO  Take 1 tablet by mouth daily as needed (For allergies.). Reported on 08/06/2015     ALPRAZolam 0.5 MG tablet  Commonly known as:  XANAX  Take 0.5 mg by  mouth at bedtime as needed for anxiety.     CALCIUM CITRATE +D PO  Take 2 tablets by mouth daily.     EPIPEN 2-PAK 0.3 mg/0.3 mL Soaj injection  Generic drug:  EPINEPHrine  Inject 0.3 mg into the muscle once.     fluticasone 50 MCG/ACT nasal spray  Commonly known as:  FLONASE  Place 1 spray into the nose daily as needed for allergies. Reported on 08/06/2015     ibuprofen 200 MG tablet  Commonly known as:  ADVIL,MOTRIN  Take 200 mg by mouth every 6 (six) hours as needed (For pain.).     levothyroxine 25 MCG tablet  Commonly known as:  SYNTHROID, LEVOTHROID  Take 25-50 mcg by mouth daily. She takes one tablet daily 4 days per week (Monday-Thursday) and two tablets daily 3 days per week (Friday-Sunday).     liothyronine 5 MCG tablet  Commonly known as:  CYTOMEL  Take 5-10 mcg by mouth 2 (two) times daily. She takes  one tablet in the morning and two tablets midday.     MELATONIN-CHAMOMILE PO  Take 1 capsule by mouth at bedtime as needed and may repeat dose one time if needed (For sleep.).     METRONIDAZOLE (TOPICAL) 0.75 % Lotn  Apply 1 application topically at bedtime.     METROLOTION EX  Apply topically at bedtime.     minoxidil 2 % external solution  Commonly known as:  ROGAINE  Apply 1 application topically 4 (four) times a week.     multivitamin with minerals Tabs tablet  Take 1 tablet by mouth daily.     oxyCODONE-acetaminophen 10-325 MG tablet  Commonly known as:  PERCOCET  Take 1-2 tablets by mouth every 4 (four) hours as needed for pain.     PROAIR RESPICLICK 108 (90 Base) MCG/ACT Aepb  Generic drug:  Albuterol Sulfate  Inhale 1 puff into the lungs every 4 (four) hours as needed (For shortness of breath.).     pyridoxine 200 MG tablet  Commonly known as:  B-6  Take 200 mg by mouth daily.     TURMERIC PO  Take 1 capsule by mouth daily.     Vitamin D3 2000 units Tabs  Take 2,000 Units by mouth daily.       Follow-up Information    Follow up with Quinn Axe, MD On 09/03/2015.   Specialty:  Obstetrics and Gynecology   Why:  at 2:30pm at the [facility] information:   [address] [place] [address] [phone]       Greater than thirty minutes were spend for face to face discharge instructions and discharge orders/summary in EPIC.   Signed: Erric Machnik DEAL 08/15/2015, 3:09 PM

## 2015-08-15 NOTE — Discharge Instructions (Signed)
08/15/2015  Return to work: 4-6 weeks if applicable  Activity: 1. Be up and out of the bed during the day.  Take a nap if needed.  You may walk up steps but be careful and use the hand rail.  Stair climbing will tire you more than you think, you may need to stop part way and rest.   2. No lifting or straining for 6 weeks.  3. No driving for 1 week(s).  Do not drive if you are taking narcotic pain medicine.  4. Shower daily.  Use soap and water on your incision and pat dry; don't rub.  No tub baths until cleared by your surgeon.   5. No sexual activity and nothing in the vagina for 6 weeks.  6. You may experience a small amount of clear drainage from your incisions, which is normal.  If the drainage persists or increases, please call the office.   Diet: 1. Low sodium Heart Healthy Diet is recommended.  2. It is safe to use a laxative, such as Miralax or Colace, if you have difficulty moving your bowels.   Wound Care: 1. Keep clean and dry.  Shower daily.  Reasons to call the Doctor:  Fever - Oral temperature greater than 100.4 degrees Fahrenheit  Foul-smelling vaginal discharge  Difficulty urinating  Nausea and vomiting  Increased pain at the site of the incision that is unrelieved with pain medicine.  Difficulty breathing with or without chest pain  New calf pain especially if only on one side  Sudden, continuing increased vaginal bleeding with or without clots.   Contacts: For questions or concerns you should contact:  Dr. Everitt Amber at [phone]  Joylene John, NP at [phone]  After Hours: call [phone] and have the GYN Oncologist paged/contacted  Abdominal Hysterectomy, Care After These instructions give you information on caring for yourself after your procedure. Your doctor may also give you more specific instructions. Call your doctor if you have any problems or questions after your procedure.  HOME CARE It takes 4-6 weeks to recover from this  surgery. Follow all of your doctor's instructions.   Only take medicines as told by your doctor.  Change your bandage as told by your doctor.  Return to your doctor to have your stitches taken out.  Take showers for 2-3 weeks. Ask your doctor when it is okay to shower.  Do not douche, use tampons, or have sex (intercourse) for at least 6 weeks or as told.  Follow your doctor's advice about exercise, lifting objects, driving, and general activities.  Get plenty of rest and sleep.  Do not lift anything heavier than a gallon of milk (about 10 pounds [4.5 kilograms]) for the first month after surgery.  Get back to your normal diet as told by your doctor.  Do not drink alcohol until your doctor says it is okay.  Take a medicine to help you poop (laxative) as told by your doctor.  Eating foods high in fiber may help you poop. Eat a lot of raw fruits and vegetables, whole grains, and beans.  Drink enough fluids to keep your pee (urine) clear or pale yellow.  Have someone help you at home for 1-2 weeks after your surgery.  Keep follow-up doctor visits as told. GET HELP IF:  You have chills or fever.  You have puffiness, redness, or pain in area of the cut (incision).  You have yellowish-white fluid (pus) coming from the cut.  You have a bad smell coming from  the cut or bandage.  Your cut pulls apart.  You feel dizzy or light-headed.  You have pain or bleeding when you pee.  You keep having watery poop (diarrhea).  You keep feeling sick to your stomach (nauseous) or keep throwing up (vomiting).  You have fluid (discharge) coming from your vagina.  You have a rash.  You have a reaction to your medicine.  You need stronger pain medicine. GET HELP RIGHT AWAY IF:   You have a fever and your symptoms suddenly get worse.  You have bad belly (abdominal) pain.  You have chest pain.  You are short of breath.  You pass out (faint).  You have pain, puffiness, or  redness of your leg.  You bleed a lot from your vagina and notice clumps of tissue (clots). MAKE SURE YOU:   Understand these instructions.  Will watch your condition.  Will get help right away if you are not doing well or get worse.   This information is not intended to replace advice given to you by your health care provider. Make sure you discuss any questions you have with your health care provider.   Document Released: 03/25/2008 Document Revised: 06/21/2013 Document Reviewed: 04/08/2013 Elsevier Interactive Patient Education [employer].

## 2015-08-15 NOTE — Progress Notes (Signed)
*  Preliminary Results* Right lower extremity venous duplex completed. Right lower extremity is negative for deep vein thrombosis. There is no evidence of right Baker's cyst.  08/15/2015 12:57 PM  Maudry Mayhew, RVT, RDCS, RDMS

## 2015-08-15 NOTE — Anesthesia Postprocedure Evaluation (Signed)
Anesthesia Post Note  Patient: [name]  Procedure(s) Performed: Procedure(s) (LRB): XI ROBOTIC ASSISTED TOTAL HYSTERECTOMY WITH BILATERAL SALPINGO OOPHORECTOMY AND SENTAL LYMPH NODE BIOPSY (Bilateral)  Patient location during evaluation: PACU Anesthesia Type: General Level of consciousness: awake and alert Pain management: pain level controlled Vital Signs Assessment: post-procedure vital signs reviewed and stable Respiratory status: spontaneous breathing, nonlabored ventilation, respiratory function stable and patient connected to nasal cannula oxygen Cardiovascular status: blood pressure returned to baseline and stable Postop Assessment: no signs of nausea or vomiting Anesthetic complications: no    Last Vitals:  Filed Vitals:   08/15/15 0525 08/15/15 1000  BP: 100/56 105/56  Pulse: 68 76  Temp: 37.2 C 37.3 C  Resp: 16 16    Last Pain:  Filed Vitals:   08/15/15 1001  PainSc: 0-No pain                 Wyatte Dames J

## 2015-08-16 ENCOUNTER — Telehealth: Payer: Self-pay | Admitting: Gynecologic Oncology

## 2015-08-16 NOTE — Telephone Encounter (Signed)
Left message to call back.  Patient has high/intermediate risk factors (grade 2 tumor, LVSI present), in an otherwise early stage (stage IA) cancer. We will be recommending vaginal brachytherapy to reduce risk of recurrence.  Donaciano Eva, MD

## 2015-08-20 ENCOUNTER — Other Ambulatory Visit: Payer: Self-pay | Admitting: Gynecologic Oncology

## 2015-08-20 DIAGNOSIS — C541 Malignant neoplasm of endometrium: Secondary | ICD-10-CM

## 2015-08-20 NOTE — Progress Notes (Signed)
For vag brachy per Dr. Denman George to begin 6 weeks post-op.  Rad Onc notified to schedule the patient

## 2015-08-22 ENCOUNTER — Telehealth: Payer: Self-pay | Admitting: Gynecologic Oncology

## 2015-08-22 NOTE — Telephone Encounter (Signed)
Patient called with an update on her post-operative status.  Reporting feeling bloated for the past two days but passing flatus and having 2-3 bowel movements a day.  Tolerating diet with no nausea or emesis.  Advised she could try simethicone over the counter to see if the bloating resolves.  Reportable signs and symptoms reviewed.  Bladder functioning without difficulty as well.  Her husband is having a cardiac cath tomorrow.  She is advised to call for any needs and to call if the symptoms persist or worsen.

## 2015-08-24 ENCOUNTER — Encounter: Payer: Self-pay | Admitting: Radiation Oncology

## 2015-08-24 NOTE — Progress Notes (Signed)
GYN Location of Tumor / Histology: Endometrial adenocarcinoma   [name] presented with symptoms of: patient reports she had bleeding from a fibroid for years.  She reports that she started having more frequent black in color bleeding that started in the fall.  She went to her GYN dr and had a d & c.  Biopsies revealed:   08/14/15 Diagnosis 1. Lymph node, sentinel, biopsy, right external iliac ONE BENIGN LYMPH NODE (0/1) 2. Lymph node, sentinel, biopsy, right obturator ONE BENIGN LYMPH NODE (0/1) 3. Lymph node, sentinel, biopsy, left external iliac ONE BENIGN LYMPH NODE (0/1) 4. Uterus +/- tubes/ovaries, neoplastic, cervix ENDOMETRIAL ADENOCARCINOMA, FIGO GRADE 2 THE TUMOR INVADES LESS THAN ONE-HALF OF THE MYOMETRIUM (0.2/1.8 CM, PT1A) LYMPHOVASCULAR INVASION IS IDENTIFIED MARGINS OF RESECTION ARE NEGATIVE FOR TUMOR LEIOMYOMA BILATERAL OVARIES AND FALLOPIAN TUBES: HISTOLOGICAL UNREMARKABLE  07/19/15  Past/Anticipated interventions by Gyn/Onc surgery, if any: 08/14/15 - Procedure: XI ROBOTIC ASSISTED TOTAL HYSTERECTOMY WITH BILATERAL SALPINGO OOPHORECTOMY AND SENTAL LYMPH NODE BIOPSY;  Surgeon: Everitt Amber, MD;  Location: [facility];  Service: Gynecology;  Laterality: Bilateral;  Past/Anticipated interventions by medical oncology, if any: no  Weight changes, if any: no  Bowel/Bladder complaints, if any: Yes.  , reports aching in her bladder.  Reports a small amount of pain with bowel movements.    Nausea/Vomiting, if any: no  Pain issues, if any:  no  SAFETY ISSUES:  Prior radiation? no  Pacemaker/ICD? no  Possible current pregnancy? no  Is the patient on methotrexate? no  Current Complaints / other details:  Dr. Denman George is recommending brachytherapy. Patient is here with her husband.  She is a Community education officer at [employer].  BP 121/58 mmHg  Pulse 68  Temp(Src) 98.3 F (36.8 C) (Oral)  Resp 16  Ht 5' 4.5" (1.638 m)  Wt 135 lb 9.6 oz (61.508 kg)  BMI 22.92 kg/m2  SpO2  99%

## 2015-08-27 ENCOUNTER — Other Ambulatory Visit (HOSPITAL_BASED_OUTPATIENT_CLINIC_OR_DEPARTMENT_OTHER): Payer: Federal, State, Local not specified - PPO

## 2015-08-27 ENCOUNTER — Telehealth: Payer: Self-pay | Admitting: Gynecologic Oncology

## 2015-08-27 DIAGNOSIS — R3989 Other symptoms and signs involving the genitourinary system: Secondary | ICD-10-CM

## 2015-08-27 DIAGNOSIS — R232 Flushing: Secondary | ICD-10-CM

## 2015-08-27 LAB — URINALYSIS, MICROSCOPIC - CHCC
Bilirubin (Urine): NEGATIVE
Blood: NEGATIVE
Glucose: NEGATIVE mg/dL
Ketones: NEGATIVE mg/dL
Leukocyte Esterase: NEGATIVE
Nitrite: NEGATIVE
Protein: NEGATIVE mg/dL
RBC / HPF: NEGATIVE (ref 0–2)
Specific Gravity, Urine: 1.015 (ref 1.003–1.035)
Urobilinogen, UR: 0.2 mg/dL (ref 0.2–1)
pH: 5 (ref 4.6–8.0)

## 2015-08-27 MED ORDER — VENLAFAXINE HCL ER 37.5 MG PO CP24: 37.5000 mg | ORAL_CAPSULE | Freq: Every day | ORAL | Status: DC

## 2015-08-27 NOTE — Telephone Encounter (Signed)
Returned call to [name].  She reports starting 3 or 4 days ago her "bladder doesn't feel good."  She denies odor, cloudy urine, or dysuria, but states her bladder aches all the time.  Advised it would be best for her to come in and give a urine sample for analysis and culture.  Lab appt made.  Also reporting moderate hot flashes that are keeping her up at night.  Asking about something over the counter or herbal to take.  Advised her concerns would be relayed to [name] and we would call her with the results of her urinalysis along with recommendations for hot flash treatment per [name].

## 2015-08-27 NOTE — Telephone Encounter (Signed)
Returned call to patient and informed her of urinalysis results.  Advised the sample would be sent for culture.  Informed that Dr. Denman George did not have a recommendation for herbal or over the counter therapies for hot flashes.  Patient interested in trying Effexor for hot flashes.  Informed of potential side effects including mood changes.  Patient advised to call for any questions or concerns.

## 2015-08-28 ENCOUNTER — Telehealth: Payer: Self-pay

## 2015-08-28 LAB — URINE CULTURE: Organism ID, Bacteria: NO GROWTH

## 2015-08-28 NOTE — Telephone Encounter (Signed)
Orders received from [facility] to contact the patient to update with U/A and Cuture were "NEG" for UTI . Patient contctaed and updated , patient states she continues to have some discomfort and will update Dr Sondra Come during her appointment with him tomorrow . Patient denies further questions at this time , will call with any additional changes or concerns.

## 2015-08-29 ENCOUNTER — Ambulatory Visit
Admission: RE | Admit: 2015-08-29 | Discharge: 2015-08-29 | Disposition: A | Discharge status: Home / Self Care | Payer: Federal, State, Local not specified - PPO | Source: Ambulatory Visit | Attending: Radiation Oncology | Admitting: Radiation Oncology

## 2015-08-29 ENCOUNTER — Encounter: Payer: Self-pay | Admitting: Gynecologic Oncology

## 2015-08-29 ENCOUNTER — Encounter: Payer: Self-pay | Admitting: Radiation Oncology

## 2015-08-29 VITALS — BP 121/58 | HR 68 | Temp 98.3°F | Resp 16 | Ht 64.5 in | Wt 135.6 lb

## 2015-08-29 DIAGNOSIS — Z9079 Acquired absence of other genital organ(s): Secondary | ICD-10-CM | POA: Diagnosis not present

## 2015-08-29 DIAGNOSIS — Z90710 Acquired absence of both cervix and uterus: Secondary | ICD-10-CM | POA: Insufficient documentation

## 2015-08-29 DIAGNOSIS — D649 Anemia, unspecified: Secondary | ICD-10-CM | POA: Diagnosis not present

## 2015-08-29 DIAGNOSIS — Z87898 Personal history of other specified conditions: Secondary | ICD-10-CM | POA: Insufficient documentation

## 2015-08-29 DIAGNOSIS — E039 Hypothyroidism, unspecified: Secondary | ICD-10-CM | POA: Insufficient documentation

## 2015-08-29 DIAGNOSIS — J309 Allergic rhinitis, unspecified: Secondary | ICD-10-CM | POA: Diagnosis not present

## 2015-08-29 DIAGNOSIS — Z510 Encounter for antineoplastic radiation therapy: Secondary | ICD-10-CM | POA: Insufficient documentation

## 2015-08-29 DIAGNOSIS — F419 Anxiety disorder, unspecified: Secondary | ICD-10-CM | POA: Insufficient documentation

## 2015-08-29 DIAGNOSIS — K279 Peptic ulcer, site unspecified, unspecified as acute or chronic, without hemorrhage or perforation: Secondary | ICD-10-CM | POA: Diagnosis not present

## 2015-08-29 DIAGNOSIS — J45909 Unspecified asthma, uncomplicated: Secondary | ICD-10-CM | POA: Insufficient documentation

## 2015-08-29 DIAGNOSIS — C541 Malignant neoplasm of endometrium: Secondary | ICD-10-CM | POA: Diagnosis not present

## 2015-08-29 DIAGNOSIS — I499 Cardiac arrhythmia, unspecified: Secondary | ICD-10-CM | POA: Insufficient documentation

## 2015-08-29 DIAGNOSIS — K219 Gastro-esophageal reflux disease without esophagitis: Secondary | ICD-10-CM | POA: Insufficient documentation

## 2015-08-29 DIAGNOSIS — Z90722 Acquired absence of ovaries, bilateral: Secondary | ICD-10-CM | POA: Diagnosis present

## 2015-08-29 HISTORY — DX: Malignant neoplasm of endometrium: C54.1

## 2015-08-29 NOTE — Progress Notes (Signed)
Please see the Nurse Progress Note in the MD Initial Consult Encounter for this patient. 

## 2015-08-29 NOTE — Progress Notes (Signed)
Radiation Oncology         [phone] ________________________________  Initial Outpatient Consultation  Name: [name] MRN: [id number]  Date: 08/29/2015  DOB: 12/29/1955  CC:Walker Kehr, MD  Everitt Amber, MD   REFERRING PHYSICIAN: Everitt Amber, MD  DIAGNOSIS: Stage IA grade 2 endometrial cancer with LVSI   HISTORY OF PRESENT ILLNESS::[name] is a 60 y.o. female who is seen out courtesy of Dr. Denman George for consideration for adjuvant radiation therapy as part of the management of patient's recently diagnosed endometrial cancer.  The patient presented with symptoms of vaginal bleeding that started in the fall. She went to Dr. Helane Rima her  GYN and had a D & C. This procedure revealed endometrial cancer.  The patient was referred to Dr. Denman George for evaluation and proceeded to undergo a robotic-assisted laparoscopic hysterectomy with bilateral salpingoophorectomy and sentinel lymph node mapping  08/14/2015, revealing endometrial adenocarcinoma, grade 2 with LVSI,  negative margins and 0/3 lymph nodes positive.  She has been doing relatively well since her surgery except for discomfort within the bladder region. She reports aching in her bladder and a small amount of pain with bowel movements and urination. It feels as if she held her bladder too long and the walls of her bladder were stretched. She denies nausea, vomiting, and pain. She is here with her husband today. After surgery, she felt exhausted but didn't have much pain. She has gained more energy since. She was receiving HRT and was recently told to stop taking it after this diagnosis.   PREVIOUS RADIATION THERAPY: No  PAST MEDICAL HISTORY:  has a past medical history of HYPOTHYROIDISM (11/12/2007); ANXIETY (03/02/2007); ALLERGIC RHINITIS (03/02/2007); ASTHMA (11/12/2007); TMJ SYNDROME (11/12/2007); GERD (03/02/2007); PEPTIC ULCER DISEASE (03/02/2007); OSTEOPENIA (11/12/2007); FIBROCYSTIC BREAST DISEASE, HX OF (03/02/2007); Cancer ([place]); Anemia;  Dysrhythmia; Pneumonia; Neuromuscular disorder ([place]); Arthritis; and Endometrial cancer ([place]).    PAST SURGICAL HISTORY: Past Surgical History  Procedure Laterality Date  . Mandib advancement forward  2009  . Endoscopy - 1990    . 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 Deviated septum surgery - 2007    . 2008 tmj surgery     . Colonoscopy x 2    . Dilation and curettage of uterus      2017  . Robotic assisted total hysterectomy with bilateral salpingo oopherectomy Bilateral 08/14/2015    Procedure: XI ROBOTIC ASSISTED TOTAL HYSTERECTOMY WITH BILATERAL SALPINGO OOPHORECTOMY AND SENTAL LYMPH NODE BIOPSY;  Surgeon: Everitt Amber, MD;  Location: [facility];  Service: Gynecology;  Laterality: Bilateral;    FAMILY HISTORY: family history includes Heart disease (age of onset: 18) in her mother; Heart disease (age of onset: 44) in her father; Hypertension in her father and other. There is no history of Asthma.  SOCIAL HISTORY:  reports that she has never smoked. She has never used smokeless tobacco. She reports that she drinks about 1.2 oz of alcohol per week. She reports that she does not use illicit drugs.  ALLERGIES: Codeine; Demerol; Penicillins; and Shellfish allergy  MEDICATIONS:  Current Outpatient Prescriptions  Medication Sig Dispense Refill  . ALPRAZolam (XANAX) 0.5 MG tablet Take 0.5 mg by mouth at bedtime as needed for anxiety.    . Calcium Citrate-Vitamin D (CALCIUM CITRATE +D PO) Take 2 tablets by mouth daily.     . Chlorpheniramine Maleate (ALLERGY PO) Take 1 tablet by mouth daily as needed (For allergies.). Reported on 08/06/2015    . Cholecalciferol (  VITAMIN D3) 2000 units TABS Take 2,000 Units by mouth daily.    Marland Kitchen EPIPEN 2-PAK 0.3 MG/0.3ML DEVI Inject 0.3 mg into the muscle once.     . fluticasone (FLONASE) 50 MCG/ACT nasal spray Place 1 spray into the nose daily as needed for allergies. Reported on 08/06/2015    . ibuprofen (ADVIL,MOTRIN) 200 MG tablet Take 200  mg by mouth every 6 (six) hours as needed (For pain.).    Marland Kitchen levothyroxine (SYNTHROID, LEVOTHROID) 25 MCG tablet Take 25-50 mcg by mouth daily. She takes one tablet daily 4 days per week (Monday-Thursday) and two tablets daily 3 days per week (Friday-Sunday).    Marland Kitchen liothyronine (CYTOMEL) 5 MCG tablet Take 5-10 mcg by mouth 2 (two) times daily. She takes one tablet in the morning and two tablets midday.    Marland Kitchen MELATONIN-CHAMOMILE PO Take 1 capsule by mouth at bedtime as needed and may repeat dose one time if needed (For sleep.).     Marland Kitchen MetroNIDAZOLE (METROLOTION EX) Apply topically at bedtime.     Marland Kitchen METRONIDAZOLE, TOPICAL, 0.75 % LOTN Apply 1 application topically at bedtime.    . minoxidil (ROGAINE) 2 % external solution Apply 1 application topically 4 (four) times a week.     . Multiple Vitamin (MULTIVITAMIN WITH MINERALS) TABS tablet Take 1 tablet by mouth daily.    Marland Kitchen pyridoxine (B-6) 200 MG tablet Take 200 mg by mouth daily.    . TURMERIC PO Take 1 capsule by mouth daily.    Marland Kitchen venlafaxine XR (EFFEXOR-XR) 37.5 MG 24 hr capsule Take 1 capsule (37.5 mg total) by mouth daily with breakfast. 30 capsule 3  . vitamin C (ASCORBIC ACID) 500 MG tablet Take 500 mg by mouth daily.    . [DISCONTINUED] escitalopram (LEXAPRO) 10 MG tablet Take 10 mg by mouth daily.      . [DISCONTINUED] Progesterone Micronized (PROGESTERONE, BULK,) POWD      No current facility-administered medications for this encounter.    REVIEW OF SYSTEMS:  A 15 point review of systems is documented in the electronic medical record. This was obtained by the nursing staff. However, I reviewed this with the patient to discuss relevant findings and make appropriate changes.  Pertinent items are noted in HPI.   PHYSICAL EXAM:  height is 5' 4.5" (1.638 m) and weight is 135 lb 9.6 oz (61.508 kg). Her oral temperature is 98.3 F (36.8 C). Her blood pressure is 121/58 and her pulse is 68. Her respiration is 16 and oxygen saturation is 99%.     General: Alert and oriented, in no acute distress HEENT: Head is normocephalic. Extraocular movements are intact. Oropharynx is clear. Heart: Regular in rate and rhythm with no murmurs, rubs, or gallops. Chest: Clear to auscultation bilaterally, with no rhonchi, wheezes, or rales. Extremities: No cyanosis or edema. Lymphatics: see Neck Exam Skin: No concerning lesions. Musculoskeletal: symmetric strength and muscle tone throughout. Psychiatric: Judgment and insight are intact. Affect is appropriate. She has multiple scars on her abdomen from her hysterectomy that are healing well. Pelvic exam deferred until simulation and planning day in light of her recent surgery   ECOG = 1  LABORATORY DATA:  Lab Results  Component Value Date   WBC 7.1 08/15/2015   HGB 12.3 08/15/2015   HCT 35.8* 08/15/2015   MCV 90.9 08/15/2015   PLT 223 08/15/2015   NEUTROABS 2.5 08/10/2015   Lab Results  Component Value Date   NA 137 08/15/2015   K 4.1 08/15/2015  CL 105 08/15/2015   CO2 25 08/15/2015   GLUCOSE 153* 08/15/2015   CREATININE 0.84 08/15/2015   CALCIUM 8.7* 08/15/2015      RADIOGRAPHY: Dg Chest 2 View  08/10/2015  CLINICAL DATA:  Current history of endometrial carcinoma. EXAM: CHEST  2 VIEW COMPARISON:  None. FINDINGS: The heart size and mediastinal contours are within normal limits. Both lungs are clear. The visualized skeletal structures are unremarkable. IMPRESSION: No active cardiopulmonary disease. Electronically Signed   By: Marijo Conception, M.D.   On: 08/10/2015 15:27    IMPRESSION: Stage IA grade 2 endometrial cancer with LVSI. Patient has high/intermediate risk factors for recurrence, and would likely benefit from postoperative brachytherapy to reduce chances for vaginal cuff recurrence. I discussed treatment course side effects and potential toxicities of radiation therapy in this situation with the patient and her husband. She appears to understand wishes to proceed with  planned course of treatment.  PLAN: The patient will be scheduled for treatment beginning approximately 6 weeks postop. The patient is scheduled see Dr. Denman George for examination next week. I anticipate 5 brachytherapy treatments directed at the vaginal cuff.    ------------------------------------------------  Blair Promise, PhD, MD    This document serves as a record of services personally performed by Gery Pray, MD. It was created on his behalf by Lendon Collar, a trained medical scribe. The creation of this record is based on the scribe's personal observations and the provider's statements to them. This document has been checked and approved by the attending provider.

## 2015-09-03 ENCOUNTER — Ambulatory Visit: Payer: Federal, State, Local not specified - PPO | Attending: Gynecologic Oncology | Admitting: Gynecologic Oncology

## 2015-09-03 ENCOUNTER — Telehealth: Payer: Self-pay | Admitting: *Deleted

## 2015-09-03 ENCOUNTER — Encounter: Payer: Self-pay | Admitting: Gynecologic Oncology

## 2015-09-03 VITALS — BP 124/58 | HR 77 | Temp 97.6°F | Resp 18 | Ht 64.5 in | Wt 135.6 lb

## 2015-09-03 DIAGNOSIS — Z90710 Acquired absence of both cervix and uterus: Secondary | ICD-10-CM | POA: Insufficient documentation

## 2015-09-03 DIAGNOSIS — M1990 Unspecified osteoarthritis, unspecified site: Secondary | ICD-10-CM | POA: Insufficient documentation

## 2015-09-03 DIAGNOSIS — Z880 Allergy status to penicillin: Secondary | ICD-10-CM | POA: Diagnosis not present

## 2015-09-03 DIAGNOSIS — E039 Hypothyroidism, unspecified: Secondary | ICD-10-CM | POA: Insufficient documentation

## 2015-09-03 DIAGNOSIS — C541 Malignant neoplasm of endometrium: Secondary | ICD-10-CM | POA: Diagnosis present

## 2015-09-03 DIAGNOSIS — Z8542 Personal history of malignant neoplasm of other parts of uterus: Secondary | ICD-10-CM | POA: Diagnosis not present

## 2015-09-03 DIAGNOSIS — K219 Gastro-esophageal reflux disease without esophagitis: Secondary | ICD-10-CM | POA: Diagnosis not present

## 2015-09-03 DIAGNOSIS — J45909 Unspecified asthma, uncomplicated: Secondary | ICD-10-CM | POA: Insufficient documentation

## 2015-09-03 DIAGNOSIS — N951 Menopausal and female climacteric states: Secondary | ICD-10-CM | POA: Diagnosis not present

## 2015-09-03 DIAGNOSIS — F419 Anxiety disorder, unspecified: Secondary | ICD-10-CM | POA: Diagnosis not present

## 2015-09-03 NOTE — Patient Instructions (Signed)
Plan to follow up after radiation.  When you have your one month follow up scheduled, please call our office to schedule your appt with Dr. Denman George.

## 2015-09-03 NOTE — Telephone Encounter (Signed)
Called patient to inform of New HDR Vag. Cuff Case, spoke with patient and she is aware of these appts. 

## 2015-09-03 NOTE — Progress Notes (Signed)
FOLLOW-UP ENDOMETRIAL CANCER  Assessment:    60 y.o. year old with Stage IA Grade 2 endometrioid endometrial cancer.   S/p robotic total hysterectomy, BSO, sentinel lymph node biopsy on 08/14/15. Positive LVSI, 12% myometrial invasion, questionably positive pelvic washings (atypical cells) and negative lymph nodes.   Plan: 1) Pathology reports reviewed today 2) Treatment counseling - high/intermediate risk factors for recurrence. I discussed with [name] that the majority of these recurrences occur in the pelvis, and at the vaginal cuff, therefore we are recommending vaginal brachytherapy to reduce the risk of recurrence in accordance with NCCN guideline recommendations. She has met with Dr [name] and this course of therapy is planned. We discussed anticipated treatment course and side effects.   After she has completed adjuvant therapy we will commence surveillance visits and will start with visits every 3 months x 2 years, then every 6 months for 3 more years, at which time she can return to annual visits.  Discussed signs and symptoms of recurrence including vaginal bleeding or discharge, leg pain or swelling and changes in bowel or bladder habits. She was given the opportunity to ask questions, which were answered to her satisfaction, and she is agreement with the above mentioned plan of care. 3) continue effexor for vasomotor symptoms of menopause 4)  Return to clinic after completing radiation.  HPI:  [name] is a 60 y.o. year old G0 initially seen in consultation in February 2016 referred by Dr [name] for grade 2 endometrial cancer. Her only risk factors for this are the exogenous compounded hormones that she took for menopause and her history of nulliparity.  She then underwent a robotic total hysterectomy on 4/65/68 without complications.  Her postoperative course was uncomplicated.  Her final pathologic diagnosis is a Stage IA Grade 2 endometrioid endometrial cancer with positive  lymphovascular space invasion, 2/18 mm (12%) of myometrial invasion and negative lymph nodes. Washings showed atypical cells but these were inconclusive for being malignant.  She is seen today for a postoperative check and to discuss her pathology results and treatment plan.  Since discharge from the hospital, she is feeling well but fatigued.  She has improving appetite, normal bowel and bladder function, and pain controlled with minimal PO medication. She has intermittent hot flashes which are controlled with effexor but notes symptoms of reflux with the use of this medication (and constipation). She has no other complaints today.  Past Medical History  Diagnosis Date  . HYPOTHYROIDISM 11/12/2007    Dr [name]  . ANXIETY 03/02/2007  . ALLERGIC RHINITIS 03/02/2007  . ASTHMA 11/12/2007    Dr [name]  . TMJ SYNDROME 11/12/2007  . GERD 03/02/2007  . PEPTIC ULCER DISEASE 03/02/2007  . OSTEOPENIA 11/12/2007  . FIBROCYSTIC BREAST DISEASE, HX OF 03/02/2007  . Cancer ([facility])     skin, hx of  . Anemia   . Dysrhythmia     pt. states at night will notice a different heart rhythem  . Pneumonia     hx. of  . Neuromuscular disorder ([place])     carpel tunnel syndrome, compression of ulner nerve at elbows  . Arthritis   . Endometrial cancer [facility])    Past Surgical History  Procedure Laterality Date  . Mandib advancement forward  2009  . Endoscopy - 1990    . 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 Deviated septum surgery - 2007    . 2008 tmj surgery     . Colonoscopy x  2    . Dilation and curettage of uterus      2017  . Robotic assisted total hysterectomy with bilateral salpingo oopherectomy Bilateral 08/14/2015    Procedure: XI ROBOTIC ASSISTED TOTAL HYSTERECTOMY WITH BILATERAL SALPINGO OOPHORECTOMY AND SENTAL LYMPH NODE BIOPSY;  Surgeon: Maria Amber, MD;  Location: [facility];  Service: Gynecology;  Laterality: Bilateral;   Family History  Problem Relation Age of Onset  . Asthma Neg Hx    . Hypertension Other   . Hypertension Father   . Heart disease Father 40    chf  . Heart disease Mother 63    CAD   Social History   Social History  . Marital Status: Married    Spouse Name: N/A  . Number of Children: N/A  . Years of Education: N/A   Occupational History  . 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 Smoking status: Never Smoker   . Smokeless tobacco: Never Used     Comment: Married, 1 child  . Alcohol Use: 1.2 oz/week    1 Glasses of wine, 1 Cans of beer per week     Comment: daily  . Drug Use: No  . Sexual Activity: Yes   Other Topics Concern  . Not on file   Social History Narrative   Current Outpatient Prescriptions on File Prior to Visit  Medication Sig Dispense Refill  . ALPRAZolam (XANAX) 0.5 MG tablet Take 0.5 mg by mouth at bedtime as needed for anxiety.    . Calcium Citrate-Vitamin D (CALCIUM CITRATE +D PO) Take 2 tablets by mouth daily.     . Chlorpheniramine Maleate (ALLERGY PO) Take 1 tablet by mouth daily as needed (For allergies.). Reported on 08/06/2015    . Cholecalciferol (VITAMIN D3) 2000 units TABS Take 2,000 Units by mouth daily.    . fluticasone (FLONASE) 50 MCG/ACT nasal spray Place 1 spray into the nose daily as needed for allergies. Reported on 08/06/2015    . ibuprofen (ADVIL,MOTRIN) 200 MG tablet Take 200 mg by mouth every 6 (six) hours as needed (For pain.).    Marland Kitchen levothyroxine (SYNTHROID, LEVOTHROID) 25 MCG tablet Take 25-50 mcg by mouth daily. She takes one tablet daily 4 days per week (Monday-Thursday) and two tablets daily 3 days per week (Friday-Sunday).    Marland Kitchen liothyronine (CYTOMEL) 5 MCG tablet Take 5-10 mcg by mouth 2 (two) times daily. She takes one tablet in the morning and two tablets midday.    Marland Kitchen MELATONIN-CHAMOMILE PO Take 1 capsule by mouth at bedtime as needed and may repeat dose one time if needed (For sleep.).     Marland Kitchen METRONIDAZOLE, TOPICAL, 0.75 % LOTN Apply 1 application topically at bedtime.    .  minoxidil (ROGAINE) 2 % external solution Apply 1 application topically 4 (four) times a week.     . Multiple Vitamin (MULTIVITAMIN WITH MINERALS) TABS tablet Take 1 tablet by mouth daily.    Marland Kitchen pyridoxine (B-6) 200 MG tablet Take 200 mg by mouth daily.    . TURMERIC PO Take 1 capsule by mouth daily.    Marland Kitchen venlafaxine XR (EFFEXOR-XR) 37.5 MG 24 hr capsule Take 1 capsule (37.5 mg total) by mouth daily with breakfast. 30 capsule 3  . vitamin C (ASCORBIC ACID) 500 MG tablet Take 500 mg by mouth daily.    Marland Kitchen EPIPEN 2-PAK 0.3 MG/0.3ML DEVI Inject 0.3 mg into the muscle once. Reported on 09/03/2015    . [DISCONTINUED] escitalopram (LEXAPRO) 10 MG  tablet Take 10 mg by mouth daily.      . [DISCONTINUED] Progesterone Micronized (PROGESTERONE, BULK,) POWD      No current facility-administered medications on file prior to visit.   Allergies  Allergen Reactions  . Codeine Other (See Comments)    Reaction unknown  . Demerol Nausea And Vomiting  . Penicillins Hives and Other (See Comments)    Face turned red and had a headache. Has patient had a PCN reaction causing immediate rash, facial/tongue/throat swelling, SOB or lightheadedness with hypotension: no Has patient had a PCN reaction causing severe rash involving mucus membranes or skin necrosis: no Has patient had a PCN reaction that required hospitalization: no Has patient had a PCN reaction occurring within the last 10 years: no If all of the above answers are "NO", then may proceed with Cephalosporin use.  . Shellfish Allergy Other (See Comments)    Stomach issues     Review of systems: Constitutional:  She has no weight gain or weight loss. She has no fever or chills. Eyes: No blurred vision Ears, Nose, Mouth, Throat: No dizziness, headaches or changes in hearing. No mouth sores. Cardiovascular: No chest pain, palpitations or edema. Respiratory:  No shortness of breath, wheezing or cough Gastrointestinal: She denies diarrhea. + constipation.  She denies any nausea or vomiting. She denies blood in her stool or heart burn. Genitourinary:  She denies pelvic pain, pelvic pressure or changes in her urinary function. She has no hematuria, dysuria, or incontinence. She has no irregular vaginal bleeding or vaginal discharge Musculoskeletal: Denies muscle weakness or joint pains.  Skin:  She has no skin changes, rashes or itching Neurological:  Denies dizziness or headaches. No neuropathy, no numbness or tingling. Psychiatric:  She denies depression or anxiety. Hematologic/Lymphatic:   No easy bruising or bleeding   Physical Exam: Blood pressure 124/58, pulse 77, temperature 97.6 F (36.4 C), temperature source Oral, resp. rate 18, height 5' 4.5" (1.638 m), weight 135 lb 9.6 oz (61.508 kg), SpO2 100 %. General: Well dressed, well nourished in no apparent distress.   HEENT:  Normocephalic and atraumatic, no lesions.  Extraocular muscles intact. Sclerae anicteric. Pupils equal, round, reactive. No mouth sores or ulcers. Thyroid is normal size, not nodular, midline. Abdomen:  Soft, nontender, nondistended.  No palpable masses.  No hepatosplenomegaly.  No ascites. Normal bowel sounds.  No hernias.  Incisions are well healed. Genitourinary: Normal EGBUS  Vaginal cuff intact.  No bleeding or discharge.  No cul de sac fullness. Extremities: No cyanosis, clubbing or edema.  No calf tenderness or erythema. No palpable cords. Psychiatric: Mood and affect are appropriate. Neurological: Awake, alert and oriented x 3. Sensation is intact, no neuropathy.  Musculoskeletal: No pain, normal strength and range of motion.   30 minutes of direct face to face counseling time was spent with the patient. This included discussion about prognosis, therapy recommendations and postoperative side effects and are beyond the scope of routine postoperative care.  Donaciano Eva, MD

## 2015-09-10 ENCOUNTER — Encounter: Payer: Self-pay | Admitting: *Deleted

## 2015-09-10 NOTE — Progress Notes (Unsigned)
[facility] Psychosocial Distress Screening Clinical Social Work  Clinical Social Work was referred by distress screening protocol.  The patient scored a 7 on the Psychosocial Distress Thermometer which indicates moderate distress. Clinical Social Worker contacted patient to assess for distress and other psychosocial needs. Mrs. Dhawan reported she has begun radiation treatment and has anxiety surrounding "not knowing what to expect", although she finds reassurance in know her statistical chance of recurrence is very low.  CSW and patient discussed difficulty dealing with the unknown and loss of control.  CSW encouraged patient to utilize [facility] free counseling and GYN support group.  Mrs. Zumbrunnen reported her daughter is coping adequately and she shares information regarding her cancer diagnosis with her child as she feels she is capable of understanding.  CSW encouraged patient to call with any questions or concerns.  ONCBCN DISTRESS SCREENING 08/29/2015  Screening Type Initial Screening  Distress experienced in past week (1-10) 7  Family Problem type Children  Emotional problem type Depression;Nervousness/Anxiety;Adjusting to illness;Boredom  Spiritual/Religous concerns type Loss of sense of purpose  Physical Problem type Pain;Sleep/insomnia;Changes in urination    Polo Riley, MSW, LCSW, OSW-C Clinical Social Worker [facility] [phone]

## 2015-09-12 ENCOUNTER — Telehealth: Payer: Self-pay | Admitting: Oncology

## 2015-09-12 NOTE — Telephone Encounter (Signed)
Left a message for Coretha regarding her request to have her leave extended until 09/27/15.  Requested a return call.

## 2015-09-13 ENCOUNTER — Encounter: Payer: Self-pay | Admitting: Oncology

## 2015-09-18 ENCOUNTER — Telehealth: Payer: Self-pay | Admitting: Oncology

## 2015-09-18 NOTE — Telephone Encounter (Signed)
[name] left a message asking what appointment on Tuesday would be the best for her husband to attend.  Called her back and advised her that the follow up appointment at 8:30 would be the best for her husband to attend with her.  [name] verbalized agreement and understanding.

## 2015-09-21 ENCOUNTER — Telehealth: Payer: Self-pay | Admitting: *Deleted

## 2015-09-21 NOTE — Telephone Encounter (Signed)
CALLED PATIENT TO INFORM OF HDR CASE BEING MOVED FROM 09-25-15 TO 09-27-15, LVM FOR A RETURN CALL

## 2015-09-25 ENCOUNTER — Ambulatory Visit: Payer: Federal, State, Local not specified - PPO | Admitting: Radiation Oncology

## 2015-09-25 ENCOUNTER — Ambulatory Visit: Payer: Federal, State, Local not specified - PPO

## 2015-09-26 ENCOUNTER — Encounter: Payer: Self-pay | Admitting: Radiation Oncology

## 2015-09-26 ENCOUNTER — Telehealth: Payer: Self-pay | Admitting: *Deleted

## 2015-09-26 NOTE — Telephone Encounter (Signed)
Called patient to remind of HDR Case for 09-27-15, lvm for a return call

## 2015-09-27 ENCOUNTER — Ambulatory Visit
Admission: RE | Admit: 2015-09-27 | Discharge: 2015-09-27 | Disposition: A | Discharge status: Home / Self Care | Payer: Federal, State, Local not specified - PPO | Source: Ambulatory Visit | Attending: Radiation Oncology | Admitting: Radiation Oncology

## 2015-09-27 ENCOUNTER — Encounter: Payer: Self-pay | Admitting: Radiation Oncology

## 2015-09-27 ENCOUNTER — Ambulatory Visit: Payer: Federal, State, Local not specified - PPO | Admitting: Radiation Oncology

## 2015-09-27 VITALS — BP 115/49 | HR 78 | Temp 98.5°F | Ht 64.5 in | Wt 138.4 lb

## 2015-09-27 DIAGNOSIS — F419 Anxiety disorder, unspecified: Secondary | ICD-10-CM | POA: Diagnosis not present

## 2015-09-27 DIAGNOSIS — Z880 Allergy status to penicillin: Secondary | ICD-10-CM | POA: Diagnosis not present

## 2015-09-27 DIAGNOSIS — Z90710 Acquired absence of both cervix and uterus: Secondary | ICD-10-CM | POA: Diagnosis not present

## 2015-09-27 DIAGNOSIS — J309 Allergic rhinitis, unspecified: Secondary | ICD-10-CM | POA: Diagnosis not present

## 2015-09-27 DIAGNOSIS — J45909 Unspecified asthma, uncomplicated: Secondary | ICD-10-CM | POA: Diagnosis not present

## 2015-09-27 DIAGNOSIS — C541 Malignant neoplasm of endometrium: Secondary | ICD-10-CM

## 2015-09-27 DIAGNOSIS — D649 Anemia, unspecified: Secondary | ICD-10-CM | POA: Diagnosis not present

## 2015-09-27 DIAGNOSIS — E039 Hypothyroidism, unspecified: Secondary | ICD-10-CM | POA: Diagnosis not present

## 2015-09-27 DIAGNOSIS — I499 Cardiac arrhythmia, unspecified: Secondary | ICD-10-CM | POA: Diagnosis not present

## 2015-09-27 DIAGNOSIS — Z87898 Personal history of other specified conditions: Secondary | ICD-10-CM | POA: Diagnosis not present

## 2015-09-27 DIAGNOSIS — K279 Peptic ulcer, site unspecified, unspecified as acute or chronic, without hemorrhage or perforation: Secondary | ICD-10-CM | POA: Diagnosis not present

## 2015-09-27 DIAGNOSIS — Z9079 Acquired absence of other genital organ(s): Secondary | ICD-10-CM | POA: Diagnosis not present

## 2015-09-27 DIAGNOSIS — Z510 Encounter for antineoplastic radiation therapy: Secondary | ICD-10-CM | POA: Diagnosis not present

## 2015-09-27 DIAGNOSIS — K219 Gastro-esophageal reflux disease without esophagitis: Secondary | ICD-10-CM | POA: Diagnosis not present

## 2015-09-27 NOTE — Progress Notes (Signed)
Radiation Oncology         [phone] ________________________________  Name: [name] MRN: [id number]  Date: 09/27/2015  DOB: 1956/01/10  Vaginal Brachytherapy Note  CC: Walker Kehr, MD  Everitt Amber, MD   Diagnosis: Stage IA grade 2 endometrial cancer with LVSI  Narrative:  The patient returns today for planning and the patient's first high-dose-rate treatment. She denies having pain, vaginal bleeding and bowel issues. She does report having some aching in her bladder after urination since surgery but says it is improving. She also mentioned she recently had a sinus infection and is getting better.                     ALLERGIES:  is allergic to codeine; demerol; penicillins; and shellfish allergy.  Meds: Current Outpatient Prescriptions  Medication Sig Dispense Refill  . ALPRAZolam (XANAX) 0.5 MG tablet Take 0.5 mg by mouth at bedtime as needed for anxiety.    . Calcium Citrate-Vitamin D (CALCIUM CITRATE +D PO) Take 2 tablets by mouth daily.     . Chlorpheniramine Maleate (ALLERGY PO) Take 1 tablet by mouth daily as needed (For allergies.). Reported on 08/06/2015    . Cholecalciferol (VITAMIN D3) 2000 units TABS Take 2,000 Units by mouth daily.    Marland Kitchen EPIPEN 2-PAK 0.3 MG/0.3ML DEVI Inject 0.3 mg into the muscle once. Reported on 09/03/2015    . fluticasone (FLONASE) 50 MCG/ACT nasal spray Place 1 spray into the nose daily as needed for allergies. Reported on 08/06/2015    . ibuprofen (ADVIL,MOTRIN) 200 MG tablet Take 200 mg by mouth every 6 (six) hours as needed (For pain.).    Marland Kitchen levothyroxine (SYNTHROID, LEVOTHROID) 25 MCG tablet Take 25-50 mcg by mouth daily. She takes one tablet daily 4 days per week (Monday-Thursday) and two tablets daily 3 days per week (Friday-Sunday).    Marland Kitchen liothyronine (CYTOMEL) 5 MCG tablet Take 5-10 mcg by mouth 2 (two) times daily. She takes one tablet in the morning and two tablets midday.    Marland Kitchen MELATONIN-CHAMOMILE PO Take 1 capsule by mouth at  bedtime as needed and may repeat dose one time if needed (For sleep.).     Marland Kitchen METRONIDAZOLE, TOPICAL, 0.75 % LOTN Apply 1 application topically at bedtime.    . minoxidil (ROGAINE) 2 % external solution Apply 1 application topically 4 (four) times a week.     . Multiple Vitamin (MULTIVITAMIN WITH MINERALS) TABS tablet Take 1 tablet by mouth daily.    . polyethylene glycol (MIRALAX / GLYCOLAX) packet Take 17 g by mouth daily.    Marland Kitchen pyridoxine (B-6) 200 MG tablet Take 200 mg by mouth daily.    . TURMERIC PO Take 1 capsule by mouth daily.    Marland Kitchen venlafaxine XR (EFFEXOR-XR) 37.5 MG 24 hr capsule Take 1 capsule (37.5 mg total) by mouth daily with breakfast. 30 capsule 3  . vitamin C (ASCORBIC ACID) 500 MG tablet Take 500 mg by mouth daily.    . [DISCONTINUED] escitalopram (LEXAPRO) 10 MG tablet Take 10 mg by mouth daily.      . [DISCONTINUED] Progesterone Micronized (PROGESTERONE, BULK,) POWD      No current facility-administered medications for this encounter.    Physical Findings: The patient is in no acute distress. Patient is alert and oriented.  height is 5' 4.5" (1.638 m) and weight is 138 lb 6.4 oz (62.778 kg). Her oral temperature is 98.5 F (36.9 C). Her blood pressure is 115/49 and her pulse  is 78. Her oxygen saturation is 99%. .  The lungs are clear. The heart has a regular rhythm and rate. The abdomen is soft and nontender with normal bowel sounds. Patient proceeded to undergo pelvic examination. The external genitalia are unremarkable. Speculum exam was performed. No mucosal lesions noted. The vaginal cuff is well-healed. On bimanual examination the cuff was noted to be intact without breakdown.  Vaginal brachytherapy procedure:  Patient proceeded to undergo fitting for her custom vaginal cylinder. The optimal diameter to distend the vaginal vault was a 2.5 cm segmented cylinder. The patient tolerated the procedure well. She did complain of some mild burning in the vaginal area related to  lubricant.  Lab Findings: Lab Results  Component Value Date   WBC 7.1 08/15/2015   HGB 12.3 08/15/2015   HCT 35.8* 08/15/2015   MCV 90.9 08/15/2015   PLT 223 08/15/2015      Impression:  Successful fitting for vaginal brachytherapy procedure  Plan:  Patient will proceed to planning and treatment later today. -----------------------------------  Blair Promise, PhD, MD  This document serves as a record of services personally performed by Gery Pray, MD. It was created on his behalf by Derek Mound, a trained medical scribe. The creation of this record is based on the scribe's personal observations and the provider's statements to them. This document has been checked and approved by the attending provider.

## 2015-09-27 NOTE — Progress Notes (Signed)
  Radiation Oncology         [phone] ________________________________  Name: [name] MRN: [id number]  Date: 09/27/2015  DOB: [birth year]  SIMULATION AND TREATMENT PLANNING NOTE HDR BRACHYTHERAPY  DIAGNOSIS:  Stage IA grade 2 endometrial cancer with LVSI  NARRATIVE:  The patient was brought to the [place] suite.  Identity was confirmed.  All relevant records and images related to the planned course of therapy were reviewed.  The patient freely provided informed written consent to proceed with treatment after reviewing the details related to the planned course of therapy. The consent form was witnessed and verified by the simulation staff.  Then, the patient was set-up in a stable reproducible  supine position for radiation therapy.  CT images were obtained.  Surface markings were placed.  The CT images were loaded into the planning software.  Then the target and avoidance structures were contoured.  Treatment planning then occurred.  The radiation prescription was entered and confirmed.   I have requested : Brachytherapy Isodose Plan and Dosimetry Calculations to plan the radiation distribution.    PLAN:  The patient will receive 30 Gy in 5 fractions. The patient will be treated with iridium 192 as the high-dose-rate source. A 2.5 cm diameter cylinder will be used for the patient's treatment. Prescription will be to the mucosal surface. Treatment length will be 3 cm.    __________________________  Blair Promise, PhD, MD  This document serves as a record of services personally performed by Gery Pray, MD. It was created on his behalf by Derek Mound, a trained medical scribe. The creation of this record is based on the scribe's personal observations and the provider's statements to them. This document has been checked and approved by the attending provider.

## 2015-09-27 NOTE — Progress Notes (Signed)
  Radiation Oncology         [phone] ________________________________  Name: [name] MRN: [id number]  Date: 09/27/2015  DOB: May 12, 1956  CC: Walker Kehr, MD  Everitt Amber, MD  HDR BRACHYTHERAPY NOTE  DIAGNOSIS:  Stage IA grade 2 endometrial cancer with LVSI   Simple treatment device note: Patient had construction of her custom vaginal cylinder. She will be treated with a 2.5 cm diameter segmented cylinder. This conforms to her anatomy without undue discomfort.  NARRATIVE:  The patient was brought to the HDR suite. Identity was confirmed. All relevant records and images related to the planned course of therapy were reviewed. The patient freely provided informed written consent to proceed with treatment after reviewing the details related to the planned course of therapy. The consent form was witnessed and verified by the simulation staff. Then, the patient was set-up in a stable reproducible supine position for radiation therapy. The patient's custom vaginal cylinder was placed in the proximal vagina. This was affixed to the CT/MR stabilization plate to prevent slippage. Patient tolerated the placement well.  Verification simulation note:  A fiducial marker was placed within the vaginal cylinder. An AP and lateral film was then obtained through the pelvis area. This documented accurate position of the vaginal cylinder for treatment.  HDR BRACHYTHERAPY TREATMENT  The remote afterloading device was affixed to the vaginal cylinder by catheter. Patient then proceeded to undergo her first high-dose-rate treatment directed at the proximal vagina. The patient was prescribed a dose of 6 gray to be delivered to the mucosal surface. Treatment length was 3 cm. Prescription was 6 gray to the mucosal surface. Patient was treated with 1 channel using 7 dwell positions. Treatment time was 271.1 seconds. Iridium 192 was the high-dose-rate source for treatment. The patient tolerated the treatment  well. After completion of her therapy, a radiation survey was performed documenting return of the iridium source into the GammaMed safe.   PLAN: Return next week for her second HDR treatment -----------------------------------  Blair Promise, PhD, MD   This document serves as a record of services personally performed by Gery Pray, MD. It was created on his behalf by Derek Mound, a trained medical scribe. The creation of this record is based on the scribe's personal observations and the provider's statements to them. This document has been checked and approved by the attending provider.

## 2015-09-27 NOTE — Progress Notes (Signed)
Please see the Nurse Progress Note in the MD Initial Consult Encounter for this patient. 

## 2015-09-27 NOTE — Progress Notes (Signed)
[name] here for follow up.  She denies having pain, vaginal bleeding and bowel issues.  She does report having some aching in her bladder after urination since surgery but says it is improving.  She also mentioned she recently had a sinus infection and is getting better.  BP 115/49 mmHg  Pulse 78  Temp(Src) 98.5 F (36.9 C) (Oral)  Ht 5' 4.5" (1.638 m)  Wt 138 lb 6.4 oz (62.778 kg)  BMI 23.40 kg/m2  SpO2 99%

## 2015-09-28 ENCOUNTER — Encounter: Payer: Self-pay | Admitting: Radiation Oncology

## 2015-10-01 ENCOUNTER — Encounter: Payer: Self-pay | Admitting: Radiation Oncology

## 2015-10-02 ENCOUNTER — Telehealth: Payer: Self-pay | Admitting: *Deleted

## 2015-10-02 NOTE — Telephone Encounter (Signed)
CALLED PATIENT TO REMIND OF 1 PM TREATMENT FOR 10-03-15, SPOKE WITH PATIENT AND SHE IS AWARE OF THIS [place].

## 2015-10-03 ENCOUNTER — Ambulatory Visit
Admission: RE | Admit: 2015-10-03 | Discharge: 2015-10-03 | Disposition: A | Discharge status: Home / Self Care | Payer: Federal, State, Local not specified - PPO | Source: Ambulatory Visit | Attending: Radiation Oncology | Admitting: Radiation Oncology

## 2015-10-03 DIAGNOSIS — K219 Gastro-esophageal reflux disease without esophagitis: Secondary | ICD-10-CM | POA: Diagnosis not present

## 2015-10-03 DIAGNOSIS — J301 Allergic rhinitis due to pollen: Secondary | ICD-10-CM | POA: Diagnosis not present

## 2015-10-03 DIAGNOSIS — J45909 Unspecified asthma, uncomplicated: Secondary | ICD-10-CM | POA: Diagnosis not present

## 2015-10-03 DIAGNOSIS — F419 Anxiety disorder, unspecified: Secondary | ICD-10-CM | POA: Diagnosis not present

## 2015-10-03 DIAGNOSIS — Z87898 Personal history of other specified conditions: Secondary | ICD-10-CM | POA: Diagnosis not present

## 2015-10-03 DIAGNOSIS — C541 Malignant neoplasm of endometrium: Secondary | ICD-10-CM

## 2015-10-03 DIAGNOSIS — J309 Allergic rhinitis, unspecified: Secondary | ICD-10-CM | POA: Diagnosis not present

## 2015-10-03 DIAGNOSIS — K279 Peptic ulcer, site unspecified, unspecified as acute or chronic, without hemorrhage or perforation: Secondary | ICD-10-CM | POA: Diagnosis not present

## 2015-10-03 DIAGNOSIS — T888 Other specified complications of surgical and medical care, not elsewhere classified: Secondary | ICD-10-CM | POA: Diagnosis not present

## 2015-10-03 DIAGNOSIS — J3089 Other allergic rhinitis: Secondary | ICD-10-CM | POA: Diagnosis not present

## 2015-10-03 DIAGNOSIS — Z9079 Acquired absence of other genital organ(s): Secondary | ICD-10-CM | POA: Diagnosis not present

## 2015-10-03 DIAGNOSIS — Z510 Encounter for antineoplastic radiation therapy: Secondary | ICD-10-CM | POA: Diagnosis not present

## 2015-10-03 DIAGNOSIS — Z90710 Acquired absence of both cervix and uterus: Secondary | ICD-10-CM | POA: Diagnosis not present

## 2015-10-03 DIAGNOSIS — I499 Cardiac arrhythmia, unspecified: Secondary | ICD-10-CM | POA: Diagnosis not present

## 2015-10-03 DIAGNOSIS — D649 Anemia, unspecified: Secondary | ICD-10-CM | POA: Diagnosis not present

## 2015-10-03 DIAGNOSIS — E039 Hypothyroidism, unspecified: Secondary | ICD-10-CM | POA: Diagnosis not present

## 2015-10-03 NOTE — Progress Notes (Signed)
  Radiation Oncology         [phone] ________________________________  Name: [name] MRN: [id number]  Date: 10/03/2015  DOB: Dec 16, 1955  CC: Walker Kehr, MD  Everitt Amber, MD  HDR BRACHYTHERAPY NOTE  DIAGNOSIS:  Stage IA grade 2 endometrial cancer with LVSI   Simple treatment device note: Patient had construction of her custom vaginal cylinder. She will be treated with a 2.5 cm diameter segmented cylinder. This conforms to her anatomy without undue discomfort.  NARRATIVE:  The patient was brought to the HDR suite. Identity was confirmed. All relevant records and images related to the planned course of therapy were reviewed. The patient freely provided informed written consent to proceed with treatment after reviewing the details related to the planned course of therapy. The consent form was witnessed and verified by the simulation staff. Then, the patient was set-up in a stable reproducible supine position for radiation therapy. The patient's custom vaginal cylinder was placed in the proximal vagina. This was affixed to the CT/MR stabilization plate to prevent slippage. Patient tolerated the placement well.  Verification simulation note:  A fiducial marker was placed within the vaginal cylinder. An AP and lateral film was then obtained through the pelvis area. This documented accurate position of the vaginal cylinder for treatment.  HDR BRACHYTHERAPY TREATMENT  The remote afterloading device was affixed to the vaginal cylinder by catheter. Patient then proceeded to undergo her second high-dose-rate treatment directed at the proximal vagina. The patient was prescribed a dose of 6 gray to be delivered to the mucosal surface. Treatment length was 3 cm. Prescription was 6 gray to the mucosal surface. Patient was treated with 1 channel using 7 dwell positions. Treatment time was 286.7 seconds. Iridium 192 was the high-dose-rate source for treatment. The patient tolerated the treatment  well. After completion of her therapy, a radiation survey was performed documenting return of the iridium source into the GammaMed safe.   PLAN: The patient will return next week for her third HDR treatment. -----------------------------------  Blair Promise, PhD, MD  This document serves as a record of services personally performed by Gery Pray, MD. It was created on his behalf by Darcus Austin, a trained medical scribe. The creation of this record is based on the scribe's personal observations and the provider's statements to them. This document has been checked and approved by the attending provider.

## 2015-10-09 ENCOUNTER — Telehealth: Payer: Self-pay | Admitting: *Deleted

## 2015-10-09 NOTE — Telephone Encounter (Signed)
CALLED PATIENT TO REMIND OF HDR [place]. FOR 10-10-15, SPOKE WITH PATIENT AND SHE IS AWARE OF THIS [place].

## 2015-10-10 ENCOUNTER — Ambulatory Visit
Admission: RE | Admit: 2015-10-10 | Discharge: 2015-10-10 | Disposition: A | Discharge status: Home / Self Care | Payer: Federal, State, Local not specified - PPO | Source: Ambulatory Visit | Attending: Radiation Oncology | Admitting: Radiation Oncology

## 2015-10-10 DIAGNOSIS — J45909 Unspecified asthma, uncomplicated: Secondary | ICD-10-CM | POA: Diagnosis not present

## 2015-10-10 DIAGNOSIS — Z510 Encounter for antineoplastic radiation therapy: Secondary | ICD-10-CM | POA: Diagnosis not present

## 2015-10-10 DIAGNOSIS — J301 Allergic rhinitis due to pollen: Secondary | ICD-10-CM | POA: Diagnosis not present

## 2015-10-10 DIAGNOSIS — T888 Other specified complications of surgical and medical care, not elsewhere classified: Secondary | ICD-10-CM | POA: Diagnosis not present

## 2015-10-10 DIAGNOSIS — I499 Cardiac arrhythmia, unspecified: Secondary | ICD-10-CM | POA: Diagnosis not present

## 2015-10-10 DIAGNOSIS — Z9079 Acquired absence of other genital organ(s): Secondary | ICD-10-CM | POA: Diagnosis not present

## 2015-10-10 DIAGNOSIS — E039 Hypothyroidism, unspecified: Secondary | ICD-10-CM | POA: Diagnosis not present

## 2015-10-10 DIAGNOSIS — Z87898 Personal history of other specified conditions: Secondary | ICD-10-CM | POA: Diagnosis not present

## 2015-10-10 DIAGNOSIS — Z90710 Acquired absence of both cervix and uterus: Secondary | ICD-10-CM | POA: Diagnosis not present

## 2015-10-10 DIAGNOSIS — D649 Anemia, unspecified: Secondary | ICD-10-CM | POA: Diagnosis not present

## 2015-10-10 DIAGNOSIS — J309 Allergic rhinitis, unspecified: Secondary | ICD-10-CM | POA: Diagnosis not present

## 2015-10-10 DIAGNOSIS — C541 Malignant neoplasm of endometrium: Secondary | ICD-10-CM | POA: Diagnosis not present

## 2015-10-10 DIAGNOSIS — K219 Gastro-esophageal reflux disease without esophagitis: Secondary | ICD-10-CM | POA: Diagnosis not present

## 2015-10-10 DIAGNOSIS — J3089 Other allergic rhinitis: Secondary | ICD-10-CM | POA: Diagnosis not present

## 2015-10-10 DIAGNOSIS — F419 Anxiety disorder, unspecified: Secondary | ICD-10-CM | POA: Diagnosis not present

## 2015-10-10 DIAGNOSIS — K279 Peptic ulcer, site unspecified, unspecified as acute or chronic, without hemorrhage or perforation: Secondary | ICD-10-CM | POA: Diagnosis not present

## 2015-10-10 NOTE — Progress Notes (Signed)
  Radiation Oncology         (336) [phone] ________________________________  Name: [name] MRN: [id number]  Date: 10/10/2015  DOB: Oct 20, 1955  CC: Walker Kehr, MD  Everitt Amber, MD  HDR BRACHYTHERAPY NOTE  DIAGNOSIS:  Stage IA grade 2 endometrial cancer with LVSI   Simple treatment device note: Patient had construction of her custom vaginal cylinder. She will be treated with a 2.5 cm diameter segmented cylinder. This conforms to her anatomy without undue discomfort.  Vaginal brachytherapy procedure: The patient was brought to the [place] suite. Identity was confirmed. All relevant records and images related to the planned course of therapy were reviewed. The patient freely provided informed written consent to proceed with treatment after reviewing the details related to the planned course of therapy. The consent form was witnessed and verified by the simulation staff. Then, the patient was set-up in a stable reproducible supine position for radiation therapy.  A pelvic exam was performed. There are no pelvic masses appreciated. The vaginal cuff was noted to be intact. The patient's custom vaginal cylinder was placed in the proximal vagina. This was affixed to the CT/MR stabilization plate to prevent slippage. Patient tolerated the placement well.  Verification simulation note:  A fiducial marker was placed within the vaginal cylinder. An AP and lateral film was then obtained through the pelvis area. This documented accurate position of the vaginal cylinder for treatment.  HDR BRACHYTHERAPY TREATMENT  The remote afterloading device was affixed to the vaginal cylinder by catheter. Patient then proceeded to undergo her third high-dose-rate treatment directed at the proximal vagina. The patient was prescribed a dose of 6 gray to be delivered to the mucosal surface. Treatment length was 3 cm. Prescription was 6 gray to the mucosal surface. Patient was treated with 1 channel using 7 dwell  positions. Treatment time was 132.4 seconds. Iridium 192 was the high-dose-rate source for treatment. The patient tolerated the treatment well. After completion of her therapy, a radiation survey was performed documenting return of the iridium source into the GammaMed safe.   PLAN: The patient will return next week for her fourth HDR treatment. -----------------------------------  Blair Promise, PhD, MD  This document serves as a record of services personally performed by Gery Pray, MD. It was created on his behalf by Arlyce Harman, a trained medical scribe. The creation of this record is based on the scribe's personal observations and the provider's statements to them. This document has been checked and approved by the attending provider.

## 2015-10-15 ENCOUNTER — Telehealth: Payer: Self-pay | Admitting: *Deleted

## 2015-10-15 NOTE — Telephone Encounter (Signed)
CALLED PATIENT TO REMIND OF HDR [place] 10-16-15 @ 1 PM, LVM FOR A RETURN CALL

## 2015-10-16 ENCOUNTER — Ambulatory Visit
Admission: RE | Admit: 2015-10-16 | Discharge: 2015-10-16 | Disposition: A | Discharge status: Home / Self Care | Payer: Federal, State, Local not specified - PPO | Source: Ambulatory Visit | Attending: Radiation Oncology | Admitting: Radiation Oncology

## 2015-10-16 DIAGNOSIS — C541 Malignant neoplasm of endometrium: Secondary | ICD-10-CM | POA: Diagnosis not present

## 2015-10-16 DIAGNOSIS — J301 Allergic rhinitis due to pollen: Secondary | ICD-10-CM | POA: Diagnosis not present

## 2015-10-16 DIAGNOSIS — D649 Anemia, unspecified: Secondary | ICD-10-CM | POA: Diagnosis not present

## 2015-10-16 DIAGNOSIS — Z510 Encounter for antineoplastic radiation therapy: Secondary | ICD-10-CM | POA: Diagnosis not present

## 2015-10-16 DIAGNOSIS — K279 Peptic ulcer, site unspecified, unspecified as acute or chronic, without hemorrhage or perforation: Secondary | ICD-10-CM | POA: Diagnosis not present

## 2015-10-16 DIAGNOSIS — J45909 Unspecified asthma, uncomplicated: Secondary | ICD-10-CM | POA: Diagnosis not present

## 2015-10-16 DIAGNOSIS — Z9079 Acquired absence of other genital organ(s): Secondary | ICD-10-CM | POA: Diagnosis not present

## 2015-10-16 DIAGNOSIS — F419 Anxiety disorder, unspecified: Secondary | ICD-10-CM | POA: Diagnosis not present

## 2015-10-16 DIAGNOSIS — I499 Cardiac arrhythmia, unspecified: Secondary | ICD-10-CM | POA: Diagnosis not present

## 2015-10-16 DIAGNOSIS — K219 Gastro-esophageal reflux disease without esophagitis: Secondary | ICD-10-CM | POA: Diagnosis not present

## 2015-10-16 DIAGNOSIS — T888 Other specified complications of surgical and medical care, not elsewhere classified: Secondary | ICD-10-CM | POA: Diagnosis not present

## 2015-10-16 DIAGNOSIS — E039 Hypothyroidism, unspecified: Secondary | ICD-10-CM | POA: Diagnosis not present

## 2015-10-16 DIAGNOSIS — J3089 Other allergic rhinitis: Secondary | ICD-10-CM | POA: Diagnosis not present

## 2015-10-16 DIAGNOSIS — J309 Allergic rhinitis, unspecified: Secondary | ICD-10-CM | POA: Diagnosis not present

## 2015-10-16 DIAGNOSIS — Z90710 Acquired absence of both cervix and uterus: Secondary | ICD-10-CM | POA: Diagnosis not present

## 2015-10-16 DIAGNOSIS — Z87898 Personal history of other specified conditions: Secondary | ICD-10-CM | POA: Diagnosis not present

## 2015-10-16 NOTE — Progress Notes (Signed)
  Radiation Oncology         [phone] ________________________________  Name: ROTUNDA GENSER MRN: [id number]  Date: 10/16/2015  DOB: 1955-08-10  CC: Walker Kehr, MD  Everitt Amber, MD  HDR BRACHYTHERAPY NOTE  DIAGNOSIS:  Stage IA grade 2 endometrial cancer with LVSI   Simple treatment device note: Patient had construction of her custom vaginal cylinder. She will be treated with a 2.5 cm diameter segmented cylinder. This conforms to her anatomy without undue discomfort.  Vaginal brachytherapy procedure: The patient was brought to the [place] suite. Identity was confirmed. All relevant records and images related to the planned course of therapy were reviewed. The patient freely provided informed written consent to proceed with treatment after reviewing the details related to the planned course of therapy. The consent form was witnessed and verified by the simulation staff. Then, the patient was set-up in a stable reproducible supine position for radiation therapy.  A pelvic exam was performed. There are no pelvic masses appreciated. The vaginal cuff was noted to be intact. The patient's custom vaginal cylinder was placed in the proximal vagina. This was affixed to the CT/MR stabilization plate to prevent slippage. Patient tolerated the placement well.  Verification simulation note:  A fiducial marker was placed within the vaginal cylinder. An AP and lateral film was then obtained through the pelvis area. This documented accurate position of the vaginal cylinder for treatment.  HDR BRACHYTHERAPY TREATMENT  The remote afterloading device was affixed to the vaginal cylinder by catheter. Patient then proceeded to undergo her fourth high-dose-rate treatment directed at the proximal vagina. The patient was prescribed a dose of 6 gray to be delivered to the mucosal surface. Treatment length was 3 cm. Prescription was 6 gray to the mucosal surface. Patient was treated with 1 channel using 7 dwell  positions. Treatment time was 140.1 seconds. Iridium 192 was the high-dose-rate source for treatment. The patient tolerated the treatment well. After completion of her therapy, a radiation survey was performed documenting return of the iridium source into the GammaMed safe.   PLAN: The patient will return next week for her fifth HDR treatment. -----------------------------------  Blair Promise, PhD, MD  This document serves as a record of services personally performed by Gery Pray, MD. It was created on his behalf by Darcus Austin, a trained medical scribe. The creation of this record is based on the scribe's personal observations and the provider's statements to them. This document has been checked and approved by the attending provider.

## 2015-10-22 ENCOUNTER — Telehealth: Payer: Self-pay | Admitting: *Deleted

## 2015-10-22 NOTE — Telephone Encounter (Signed)
CALLED PATIENT TO REMIND OF HDR [place] 10-23-15 @ 1 PM, LVM FOR A RETURN CALL

## 2015-10-23 ENCOUNTER — Encounter: Payer: Self-pay | Admitting: Radiation Oncology

## 2015-10-23 ENCOUNTER — Ambulatory Visit
Admission: RE | Admit: 2015-10-23 | Discharge: 2015-10-23 | Disposition: A | Discharge status: Home / Self Care | Payer: Federal, State, Local not specified - PPO | Source: Ambulatory Visit | Attending: Radiation Oncology | Admitting: Radiation Oncology

## 2015-10-23 DIAGNOSIS — J309 Allergic rhinitis, unspecified: Secondary | ICD-10-CM | POA: Diagnosis not present

## 2015-10-23 DIAGNOSIS — E039 Hypothyroidism, unspecified: Secondary | ICD-10-CM | POA: Diagnosis not present

## 2015-10-23 DIAGNOSIS — I499 Cardiac arrhythmia, unspecified: Secondary | ICD-10-CM | POA: Diagnosis not present

## 2015-10-23 DIAGNOSIS — C541 Malignant neoplasm of endometrium: Secondary | ICD-10-CM

## 2015-10-23 DIAGNOSIS — Z9079 Acquired absence of other genital organ(s): Secondary | ICD-10-CM | POA: Diagnosis not present

## 2015-10-23 DIAGNOSIS — F419 Anxiety disorder, unspecified: Secondary | ICD-10-CM | POA: Diagnosis not present

## 2015-10-23 DIAGNOSIS — K279 Peptic ulcer, site unspecified, unspecified as acute or chronic, without hemorrhage or perforation: Secondary | ICD-10-CM | POA: Diagnosis not present

## 2015-10-23 DIAGNOSIS — T888 Other specified complications of surgical and medical care, not elsewhere classified: Secondary | ICD-10-CM | POA: Diagnosis not present

## 2015-10-23 DIAGNOSIS — Z510 Encounter for antineoplastic radiation therapy: Secondary | ICD-10-CM | POA: Diagnosis not present

## 2015-10-23 DIAGNOSIS — J301 Allergic rhinitis due to pollen: Secondary | ICD-10-CM | POA: Diagnosis not present

## 2015-10-23 DIAGNOSIS — D649 Anemia, unspecified: Secondary | ICD-10-CM | POA: Diagnosis not present

## 2015-10-23 DIAGNOSIS — Z87898 Personal history of other specified conditions: Secondary | ICD-10-CM | POA: Diagnosis not present

## 2015-10-23 DIAGNOSIS — K219 Gastro-esophageal reflux disease without esophagitis: Secondary | ICD-10-CM | POA: Diagnosis not present

## 2015-10-23 DIAGNOSIS — J3089 Other allergic rhinitis: Secondary | ICD-10-CM | POA: Diagnosis not present

## 2015-10-23 DIAGNOSIS — Z90710 Acquired absence of both cervix and uterus: Secondary | ICD-10-CM | POA: Diagnosis not present

## 2015-10-23 DIAGNOSIS — J45909 Unspecified asthma, uncomplicated: Secondary | ICD-10-CM | POA: Diagnosis not present

## 2015-10-23 NOTE — Progress Notes (Signed)
  Radiation Oncology         [phone] ________________________________  Name: [name] MRN: [id number]  Date: 10/23/2015  DOB: 1955-10-09  CC: Walker Kehr, MD  Everitt Amber, MD  HDR BRACHYTHERAPY NOTE  DIAGNOSIS:  Stage IA grade 2 endometrial cancer with LVSI   Simple treatment device note: Patient had construction of her custom vaginal cylinder. She will be treated with a 2.5 cm diameter segmented cylinder. This conforms to her anatomy without undue discomfort.  Vaginal brachytherapy procedure: The patient was brought to the [place] suite. Identity was confirmed. All relevant records and images related to the planned course of therapy were reviewed. The patient freely provided informed written consent to proceed with treatment after reviewing the details related to the planned course of therapy. The consent form was witnessed and verified by the simulation staff. Then, the patient was set-up in a stable reproducible supine position for radiation therapy.  A pelvic exam was performed. There are no pelvic masses appreciated. The vaginal cuff was noted to be intact. The patient's custom vaginal cylinder was placed in the proximal vagina. This was affixed to the CT/MR stabilization plate to prevent slippage. Patient tolerated the placement well.  Verification simulation note:  A fiducial marker was placed within the vaginal cylinder. An AP and lateral film was then obtained through the pelvis area. This documented accurate position of the vaginal cylinder for treatment.  HDR BRACHYTHERAPY TREATMENT  The remote afterloading device was affixed to the vaginal cylinder by catheter. Patient then proceeded to undergo her fifth high-dose-rate treatment directed at the proximal vagina. The patient was prescribed a dose of 6 gray to be delivered to the mucosal surface. Treatment length was 3 cm. Patient was treated with 1 channel using 7 dwell positions. Treatment time was 149.6 seconds. Iridium  192 was the high-dose-rate source for treatment. The patient tolerated the treatment well. After completion of her therapy, a radiation survey was performed documenting return of the iridium source into the GammaMed safe.   PLAN: The patient has completed her HDR treatments and will return to the clinic in 1 month for a follow up. -----------------------------------  Blair Promise, PhD, MD  This document serves as a record of services personally performed by Gery Pray, MD. It was created on his behalf by Darcus Austin, a trained medical scribe. The creation of this record is based on the scribe's personal observations and the provider's statements to them. This document has been checked and approved by the attending provider.

## 2015-10-24 NOTE — Addendum Note (Signed)
Encounter addended by: Jacqulyn Liner, RN on: 10/24/2015 11:24 AM<BR>     Documentation filed: Charges VN

## 2015-11-07 DIAGNOSIS — J301 Allergic rhinitis due to pollen: Secondary | ICD-10-CM | POA: Diagnosis not present

## 2015-11-07 DIAGNOSIS — J3089 Other allergic rhinitis: Secondary | ICD-10-CM | POA: Diagnosis not present

## 2015-11-13 ENCOUNTER — Telehealth: Payer: Self-pay | Admitting: Oncology

## 2015-11-13 NOTE — Telephone Encounter (Signed)
[name] left a message asking when she can resume intercourse.  Called her back and advised her that she can resume intercourse 6 weeks after her last brachytherapy treatment.  [name] verbalized agreement and understanding.

## 2015-11-14 NOTE — Progress Notes (Signed)
°  Radiation Oncology         [phone] ________________________________  Name: [name] MRN: [id number]  Date: 10/23/2015  DOB: Aug 01, 1955  End of Treatment Note  DIAGNOSIS: Stage IA grade 2 endometrial cancer with LVSI  Indication for treatment:  Post operative       Radiation treatment dates:   09/27/2015-10/23/2015  Site/dose:   The vaginal cuff was treated to 30 Gy in 5 fractions at 6 Gy per fraction.   Beams/energy:   HDR-Vaginal // Iridium HDR,  A 2.5 cm diameter cylinder was used for the patient's treatment. Prescription  to the mucosal surface. Treatment length will be 3 cm.  Narrative: The patient tolerated radiation treatment relatively well.  She experienced minimal vaginal discomfort and bladder issues with treatment.  Plan: The patient has completed radiation treatment. The patient will return to radiation oncology clinic for routine followup in one month. I advised them to call or return sooner if they have any questions or concerns related to their recovery or treatment.  -----------------------------------  Blair Promise, PhD, MD  This document serves as a record of services personally performed by Gery Pray, MD. It was created on his behalf by Arlyce Harman, a trained medical scribe. The creation of this record is based on the scribe's personal observations and the provider's statements to them. This document has been checked and approved by the attending provider.

## 2015-11-16 DIAGNOSIS — J3089 Other allergic rhinitis: Secondary | ICD-10-CM | POA: Diagnosis not present

## 2015-11-16 DIAGNOSIS — J301 Allergic rhinitis due to pollen: Secondary | ICD-10-CM | POA: Diagnosis not present

## 2015-11-23 DIAGNOSIS — J301 Allergic rhinitis due to pollen: Secondary | ICD-10-CM | POA: Diagnosis not present

## 2015-11-30 ENCOUNTER — Encounter: Payer: Self-pay | Admitting: Oncology

## 2015-11-30 DIAGNOSIS — J301 Allergic rhinitis due to pollen: Secondary | ICD-10-CM | POA: Diagnosis not present

## 2015-11-30 DIAGNOSIS — J3089 Other allergic rhinitis: Secondary | ICD-10-CM | POA: Diagnosis not present

## 2015-12-06 ENCOUNTER — Ambulatory Visit
Admission: RE | Admit: 2015-12-06 | Discharge: 2015-12-06 | Disposition: A | Discharge status: Home / Self Care | Payer: Federal, State, Local not specified - PPO | Source: Ambulatory Visit | Attending: Radiation Oncology | Admitting: Radiation Oncology

## 2015-12-06 ENCOUNTER — Encounter: Payer: Self-pay | Admitting: Radiation Oncology

## 2015-12-06 VITALS — BP 111/47 | HR 64 | Temp 98.5°F | Ht 64.5 in | Wt 133.8 lb

## 2015-12-06 DIAGNOSIS — J3089 Other allergic rhinitis: Secondary | ICD-10-CM | POA: Diagnosis not present

## 2015-12-06 DIAGNOSIS — Y842 Radiological procedure and radiotherapy as the cause of abnormal reaction of the patient, or of later complication, without mention of misadventure at the time of the procedure: Secondary | ICD-10-CM | POA: Diagnosis not present

## 2015-12-06 DIAGNOSIS — G5601 Carpal tunnel syndrome, right upper limb: Secondary | ICD-10-CM | POA: Diagnosis not present

## 2015-12-06 DIAGNOSIS — C541 Malignant neoplasm of endometrium: Secondary | ICD-10-CM | POA: Diagnosis not present

## 2015-12-06 DIAGNOSIS — Z880 Allergy status to penicillin: Secondary | ICD-10-CM | POA: Insufficient documentation

## 2015-12-06 DIAGNOSIS — J301 Allergic rhinitis due to pollen: Secondary | ICD-10-CM | POA: Diagnosis not present

## 2015-12-06 DIAGNOSIS — G5622 Lesion of ulnar nerve, left upper limb: Secondary | ICD-10-CM | POA: Diagnosis not present

## 2015-12-06 DIAGNOSIS — G5602 Carpal tunnel syndrome, left upper limb: Secondary | ICD-10-CM | POA: Diagnosis not present

## 2015-12-06 DIAGNOSIS — G5603 Carpal tunnel syndrome, bilateral upper limbs: Secondary | ICD-10-CM | POA: Diagnosis not present

## 2015-12-06 NOTE — Progress Notes (Signed)
Ms. [place] here for reassessment s/p xrt to pelvis for endometrail cancer.  Denies any pain at this time.

## 2015-12-06 NOTE — Progress Notes (Signed)
Radiation Oncology         [phone] ________________________________  Name: [name] MRN: [id number]  Date: 12/06/2015  DOB: [birth year]  Follow Up Note  CC: Maria Kehr, MD  Maria Amber, MD  Diagnosis: Stage IA grade 2 endometrial cancer with LVSI  Indication for treatment:  Post operative       Radiation treatment dates:   09/27/2015-10/23/2015  Site/dose:   The vaginal cuff was treated to 30 Gy in 5 fractions at 6 Gy per fraction.   Narrative: [name] here for reassessment s/p xrt to pelvis for endometrial cancer. Denies any pain at this time. Denies urinary frequency. Some minor rectal discomfort and bleeding that started since starting Effexor for hot flashes.   ALLERGIES:  is allergic to astroglide; codeine; demerol; penicillins; and shellfish allergy. Meds: Current Outpatient Prescriptions  Medication Sig Dispense Refill  . ALPRAZolam (XANAX) 0.5 MG tablet Take 0.5 mg by mouth at bedtime as needed for anxiety. Taking 0.25mg  po    . Calcium Citrate-Vitamin D (CALCIUM CITRATE +D PO) Take 2 tablets by mouth daily.     . cetirizine (ZYRTEC) 5 MG tablet Take 5 mg by mouth 2 (two) times daily as needed for allergies.    . Cholecalciferol (VITAMIN D3) 2000 units TABS Take 2,000 Units by mouth daily.    Marland Kitchen EPIPEN 2-PAK 0.3 MG/0.3ML DEVI Inject 0.3 mg into the muscle once. Reported on 09/03/2015    . fluticasone (FLONASE) 50 MCG/ACT nasal spray Place 1 spray into the nose daily as needed for allergies. Reported on 08/06/2015    . ibuprofen (ADVIL,MOTRIN) 200 MG tablet Take 200 mg by mouth every 6 (six) hours as needed (For pain.).    Marland Kitchen levothyroxine (SYNTHROID, LEVOTHROID) 25 MCG tablet Take 25-50 mcg by mouth daily. She takes one tablet daily 4 days per week (Monday-Thursday) and two tablets daily 3 days per week (Friday-Sunday).    Marland Kitchen liothyronine (CYTOMEL) 5 MCG tablet Take 5-10 mcg by mouth 2 (two) times daily. She takes one tablet in the morning and two tablets midday.    Marland Kitchen  MELATONIN-CHAMOMILE PO Take 1 capsule by mouth at bedtime as needed and may repeat dose one time if needed (For sleep.).     Marland Kitchen METRONIDAZOLE, TOPICAL, 0.75 % LOTN Apply 1 application topically at bedtime.    . minoxidil (ROGAINE) 2 % external solution Apply 1 application topically 4 (four) times a week.     . Multiple Vitamin (MULTIVITAMIN WITH MINERALS) TABS tablet Take 1 tablet by mouth daily.    . Omega-3 Fatty Acids (FISH OIL) 1000 MG CAPS Take 1 capsule by mouth daily.    Marland Kitchen pyridoxine (B-6) 200 MG tablet Take 200 mg by mouth daily.    . TURMERIC PO Take 1 capsule by mouth daily.    Marland Kitchen venlafaxine XR (EFFEXOR-XR) 37.5 MG 24 hr capsule Take 1 capsule (37.5 mg total) by mouth daily with breakfast. 30 capsule 3  . vitamin C (ASCORBIC ACID) 500 MG tablet Take 500 mg by mouth daily.    . polyethylene glycol (MIRALAX / GLYCOLAX) packet Take 17 g by mouth daily. As needed    . [DISCONTINUED] escitalopram (LEXAPRO) 10 MG tablet Take 10 mg by mouth daily.      . [DISCONTINUED] Progesterone Micronized (PROGESTERONE, BULK,) POWD      No current facility-administered medications for this encounter.    Physical Findings: The patient is in no acute distress. Patient is alert and oriented.  height is 5' 4.5" (1.638  m) and weight is 133 lb 12.8 oz (60.691 kg). Her temperature is 98.5 F (36.9 C). Her blood pressure is 111/47 and her pulse is 64. .  The lungs are clear. The heart has a regular rhythm and rate. The abdomen is soft and nontender with normal bowel sounds. Patient will undergo pelvic exam at her next Dr. Denman George visit.   Lab Findings: Lab Results  Component Value Date   WBC 7.1 08/15/2015   HGB 12.3 08/15/2015   HCT 35.8* 08/15/2015   MCV 90.9 08/15/2015   PLT 223 08/15/2015    Impression: The patient is recovering from the effects of radiation. The patient is appropriate to begin use of a vaginal dilator. We discussed using this at least 3 times/week for 20 minutes.  I answered all of  her question and concerns regarding use of the vaginal dilator. She is cleared to resume sexual intercourse.  Plan: Fitted for vaginal dilator today. Nursing education given. She will follow up with Dr.Rossi in 2 months, pelvic exam at that time. I will see her for follow up in 5 months.  -----------------------------------  Maria Promise, PhD, MD  This document serves as a record of services personally performed by Gery Pray, MD. It was created on his behalf by Derek Mound, a trained medical scribe. The creation of this record is based on the scribe's personal observations and the provider's statements to them. This document has been checked and approved by the attending provider.

## 2015-12-10 DIAGNOSIS — J3089 Other allergic rhinitis: Secondary | ICD-10-CM | POA: Diagnosis not present

## 2015-12-21 DIAGNOSIS — J3089 Other allergic rhinitis: Secondary | ICD-10-CM | POA: Diagnosis not present

## 2015-12-21 DIAGNOSIS — J301 Allergic rhinitis due to pollen: Secondary | ICD-10-CM | POA: Diagnosis not present

## 2015-12-25 DIAGNOSIS — H903 Sensorineural hearing loss, bilateral: Secondary | ICD-10-CM | POA: Diagnosis not present

## 2015-12-25 DIAGNOSIS — H9313 Tinnitus, bilateral: Secondary | ICD-10-CM | POA: Diagnosis not present

## 2015-12-26 DIAGNOSIS — J3089 Other allergic rhinitis: Secondary | ICD-10-CM | POA: Diagnosis not present

## 2015-12-26 DIAGNOSIS — J301 Allergic rhinitis due to pollen: Secondary | ICD-10-CM | POA: Diagnosis not present

## 2016-01-04 DIAGNOSIS — J301 Allergic rhinitis due to pollen: Secondary | ICD-10-CM | POA: Diagnosis not present

## 2016-01-08 DIAGNOSIS — K080 Exfoliation of teeth due to systemic causes: Secondary | ICD-10-CM | POA: Diagnosis not present

## 2016-01-11 DIAGNOSIS — J301 Allergic rhinitis due to pollen: Secondary | ICD-10-CM | POA: Diagnosis not present

## 2016-01-11 DIAGNOSIS — J3089 Other allergic rhinitis: Secondary | ICD-10-CM | POA: Diagnosis not present

## 2016-01-14 DIAGNOSIS — M810 Age-related osteoporosis without current pathological fracture: Secondary | ICD-10-CM | POA: Diagnosis not present

## 2016-01-14 DIAGNOSIS — E559 Vitamin D deficiency, unspecified: Secondary | ICD-10-CM | POA: Diagnosis not present

## 2016-01-17 ENCOUNTER — Other Ambulatory Visit: Payer: Self-pay | Admitting: Gynecologic Oncology

## 2016-01-25 DIAGNOSIS — J3089 Other allergic rhinitis: Secondary | ICD-10-CM | POA: Diagnosis not present

## 2016-01-25 DIAGNOSIS — J301 Allergic rhinitis due to pollen: Secondary | ICD-10-CM | POA: Diagnosis not present

## 2016-02-06 DIAGNOSIS — J301 Allergic rhinitis due to pollen: Secondary | ICD-10-CM | POA: Diagnosis not present

## 2016-02-06 DIAGNOSIS — G5622 Lesion of ulnar nerve, left upper limb: Secondary | ICD-10-CM | POA: Diagnosis not present

## 2016-02-06 DIAGNOSIS — M180 Bilateral primary osteoarthritis of first carpometacarpal joints: Secondary | ICD-10-CM | POA: Diagnosis not present

## 2016-02-06 DIAGNOSIS — J3089 Other allergic rhinitis: Secondary | ICD-10-CM | POA: Diagnosis not present

## 2016-02-06 DIAGNOSIS — G5601 Carpal tunnel syndrome, right upper limb: Secondary | ICD-10-CM | POA: Diagnosis not present

## 2016-02-06 DIAGNOSIS — G5602 Carpal tunnel syndrome, left upper limb: Secondary | ICD-10-CM | POA: Diagnosis not present

## 2016-02-18 ENCOUNTER — Encounter: Payer: Self-pay | Admitting: Gynecologic Oncology

## 2016-02-18 ENCOUNTER — Ambulatory Visit: Payer: Federal, State, Local not specified - PPO | Attending: Gynecologic Oncology | Admitting: Gynecologic Oncology

## 2016-02-18 VITALS — BP 122/53 | HR 64 | Temp 99.2°F | Resp 18 | Ht 65.4 in | Wt 135.7 lb

## 2016-02-18 DIAGNOSIS — F419 Anxiety disorder, unspecified: Secondary | ICD-10-CM | POA: Diagnosis not present

## 2016-02-18 DIAGNOSIS — Z8249 Family history of ischemic heart disease and other diseases of the circulatory system: Secondary | ICD-10-CM | POA: Diagnosis not present

## 2016-02-18 DIAGNOSIS — Z923 Personal history of irradiation: Secondary | ICD-10-CM | POA: Insufficient documentation

## 2016-02-18 DIAGNOSIS — Z90710 Acquired absence of both cervix and uterus: Secondary | ICD-10-CM | POA: Insufficient documentation

## 2016-02-18 DIAGNOSIS — Z85828 Personal history of other malignant neoplasm of skin: Secondary | ICD-10-CM | POA: Insufficient documentation

## 2016-02-18 DIAGNOSIS — D649 Anemia, unspecified: Secondary | ICD-10-CM | POA: Insufficient documentation

## 2016-02-18 DIAGNOSIS — F411 Generalized anxiety disorder: Secondary | ICD-10-CM | POA: Diagnosis not present

## 2016-02-18 DIAGNOSIS — N952 Postmenopausal atrophic vaginitis: Secondary | ICD-10-CM | POA: Diagnosis not present

## 2016-02-18 DIAGNOSIS — C541 Malignant neoplasm of endometrium: Secondary | ICD-10-CM | POA: Insufficient documentation

## 2016-02-18 DIAGNOSIS — K219 Gastro-esophageal reflux disease without esophagitis: Secondary | ICD-10-CM | POA: Diagnosis not present

## 2016-02-18 DIAGNOSIS — J45909 Unspecified asthma, uncomplicated: Secondary | ICD-10-CM | POA: Diagnosis not present

## 2016-02-18 DIAGNOSIS — Z880 Allergy status to penicillin: Secondary | ICD-10-CM | POA: Diagnosis not present

## 2016-02-18 DIAGNOSIS — Z90722 Acquired absence of ovaries, bilateral: Secondary | ICD-10-CM | POA: Insufficient documentation

## 2016-02-18 DIAGNOSIS — K279 Peptic ulcer, site unspecified, unspecified as acute or chronic, without hemorrhage or perforation: Secondary | ICD-10-CM | POA: Insufficient documentation

## 2016-02-18 DIAGNOSIS — Z888 Allergy status to other drugs, medicaments and biological substances status: Secondary | ICD-10-CM | POA: Diagnosis not present

## 2016-02-18 DIAGNOSIS — N6019 Diffuse cystic mastopathy of unspecified breast: Secondary | ICD-10-CM | POA: Diagnosis not present

## 2016-02-18 DIAGNOSIS — N951 Menopausal and female climacteric states: Secondary | ICD-10-CM | POA: Insufficient documentation

## 2016-02-18 DIAGNOSIS — Z885 Allergy status to narcotic agent status: Secondary | ICD-10-CM | POA: Diagnosis not present

## 2016-02-18 DIAGNOSIS — Z91013 Allergy to seafood: Secondary | ICD-10-CM | POA: Insufficient documentation

## 2016-02-18 DIAGNOSIS — G5600 Carpal tunnel syndrome, unspecified upper limb: Secondary | ICD-10-CM | POA: Diagnosis not present

## 2016-02-18 DIAGNOSIS — G709 Myoneural disorder, unspecified: Secondary | ICD-10-CM | POA: Diagnosis not present

## 2016-02-18 DIAGNOSIS — Z8701 Personal history of pneumonia (recurrent): Secondary | ICD-10-CM | POA: Diagnosis not present

## 2016-02-18 DIAGNOSIS — M1990 Unspecified osteoarthritis, unspecified site: Secondary | ICD-10-CM | POA: Diagnosis not present

## 2016-02-18 DIAGNOSIS — E039 Hypothyroidism, unspecified: Secondary | ICD-10-CM | POA: Diagnosis not present

## 2016-02-18 DIAGNOSIS — M8580 Other specified disorders of bone density and structure, unspecified site: Secondary | ICD-10-CM | POA: Diagnosis present

## 2016-02-18 DIAGNOSIS — N949 Unspecified condition associated with female genital organs and menstrual cycle: Secondary | ICD-10-CM | POA: Insufficient documentation

## 2016-02-18 MED ORDER — ESTRADIOL 0.1 MG/GM VA CREA: 1.0000 | TOPICAL_CREAM | VAGINAL | Status: DC

## 2016-02-18 MED ORDER — ALPRAZOLAM 0.5 MG PO TABS: 0.5000 mg | ORAL_TABLET | Freq: Every evening | ORAL | 1 refills | Status: DC | PRN

## 2016-02-18 NOTE — Progress Notes (Signed)
FOLLOW-UP ENDOMETRIAL CANCER  Assessment and Plan:    60 y.o. year old with Stage IA Grade 2 endometrioid endometrial cancer.   S/p robotic total hysterectomy, BSO, sentinel lymph node biopsy on 08/14/15. Positive LVSI, 12% myometrial invasion, questionably positive pelvic washings (atypical cells) and negative lymph nodes.  Genital atrophy of menopause: recommend vaginal estrace three times weekly.  Hot flashes: continue effexor Anxiety: represcribed xanax - caution regarding addictive nature and to avoid using with alcohol.  Discussed signs and symptoms of recurrence including vaginal bleeding or discharge, leg pain or swelling and changes in bowel or bladder habits. She was given the opportunity to ask questions, which were answered to her satisfaction, and she is agreement with the above mentioned plan of care.  She will see Dr Sondra Come in 3 months and return to see me in February 2018.  HPI:  [name] is a 60 y.o. year old G0 initially seen in consultation in February 2016 referred by Dr Helane Rima for grade 2 endometrial cancer. Her only risk factors for this are the exogenous compounded hormones that she took for menopause and her history of nulliparity.  She then underwent a robotic total hysterectomy on [id number] without complications.  Her postoperative course was uncomplicated.  Her final pathologic diagnosis is a Stage IA Grade 2 endometrioid endometrial cancer with positive lymphovascular space invasion, 2/18 mm (12%) of myometrial invasion and negative lymph nodes. Washings showed atypical cells but these were inconclusive for being malignant.  Interval Hx: She is seen today for a routine surveillance visit. She continues to have hot flashes which are minimally controlled with effexor but notes symptoms of reflux with the use of this medication (and constipation). She has significant deep dyspareunia and sensation of shortened vagina. She has anxiety and requires xanax 0.5mg  daily to  help sleep.  Past Medical History:  Diagnosis Date  . ALLERGIC RHINITIS 03/02/2007  . Anemia   . ANXIETY 03/02/2007  . Arthritis   . ASTHMA 11/12/2007   Dr Orvil Feil  . Cancer (HCC)    skin, hx of  . Dysrhythmia    pt. states at night will notice a different heart rhythem  . Endometrial cancer ([place])   . FIBROCYSTIC BREAST DISEASE, HX OF 03/02/2007  . GERD 03/02/2007  . HYPOTHYROIDISM 11/12/2007   Dr Chalmers Cater  . Neuromuscular disorder (HCC)    carpel tunnel syndrome, compression of ulner nerve at elbows  . OSTEOPENIA 11/12/2007  . PEPTIC ULCER DISEASE 03/02/2007  . Pneumonia    hx. of  . Radiation 09/27/15-10/23/15   HDR to vaginal cuff 30 Gy  . TMJ SYNDROME 11/12/2007   Past Surgical History:  Procedure Laterality Date  . 2008 TMJ surgery     . colonoscopy x 2    . deviated septum surgery - 2007    . DILATION AND CURETTAGE OF UTERUS     2017  . endoscopy - 1990    . Mandib advancement forward  2009  . ROBOTIC ASSISTED TOTAL HYSTERECTOMY WITH BILATERAL SALPINGO OOPHERECTOMY Bilateral 08/14/2015   Procedure: XI ROBOTIC ASSISTED TOTAL HYSTERECTOMY WITH BILATERAL SALPINGO OOPHORECTOMY AND SENTAL LYMPH NODE BIOPSY;  Surgeon: Everitt Amber, MD;  Location: [facility];  Service: Gynecology;  Laterality: Bilateral;  . 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 Hypertension Father   . Heart disease Father 36    chf  . Heart disease Mother 16    CAD  .  Hypertension Other   . Asthma Neg Hx    Social History   Social History  . Marital status: Married    Spouse name: N/A  . Number of children: N/A  . Years of education: N/A   Occupational History  . Physical Therapist at [employer]  . Smoking status: Never Smoker  . Smokeless tobacco: Never Used     Comment: Married, 1 child  . Alcohol use 1.2 oz/week    1 Glasses of wine, 1 Cans of beer per week     Comment: daily  . Drug use: No  . Sexual  activity: Yes   Other Topics Concern  . Not on file   Social History Narrative  . No narrative on file   Current Outpatient Prescriptions on File Prior to Visit  Medication Sig Dispense Refill  . Calcium Citrate-Vitamin D (CALCIUM CITRATE +D PO) Take 2 tablets by mouth daily.     . Cholecalciferol (VITAMIN D3) 2000 units TABS Take 2,000 Units by mouth daily.    Marland Kitchen levothyroxine (SYNTHROID, LEVOTHROID) 25 MCG tablet Take 25-50 mcg by mouth daily. She takes one tablet daily 4 days per week (Monday-Thursday) and two tablets daily 3 days per week (Friday-Sunday).    Marland Kitchen liothyronine (CYTOMEL) 5 MCG tablet Take 5-10 mcg by mouth 2 (two) times daily. She takes one tablet in the morning and two tablets midday.    Marland Kitchen METRONIDAZOLE, TOPICAL, 0.75 % LOTN Apply 1 application topically at bedtime.    . minoxidil (ROGAINE) 2 % external solution Apply 1 application topically 4 (four) times a week.     . Multiple Vitamin (MULTIVITAMIN WITH MINERALS) TABS tablet Take 1 tablet by mouth daily.    . Omega-3 Fatty Acids (FISH OIL) 1000 MG CAPS Take 1 capsule by mouth daily.    Marland Kitchen pyridoxine (B-6) 200 MG tablet Take 200 mg by mouth daily.    . TURMERIC PO Take 1 capsule by mouth daily.    Marland Kitchen venlafaxine XR (EFFEXOR-XR) 37.5 MG 24 hr capsule TAKE 1 CAPSULE (37.5 MG TOTAL) BY MOUTH DAILY WITH BREAKFAST. 30 capsule 6  . vitamin C (ASCORBIC ACID) 500 MG tablet Take 500 mg by mouth daily.    . cetirizine (ZYRTEC) 5 MG tablet Take 5 mg by mouth 2 (two) times daily as needed for allergies.    Marland Kitchen EPIPEN 2-PAK 0.3 MG/0.3ML DEVI Inject 0.3 mg into the muscle once. Reported on 09/03/2015    . fluticasone (FLONASE) 50 MCG/ACT nasal spray Place 1 spray into the nose daily as needed for allergies. Reported on 08/06/2015    . ibuprofen (ADVIL,MOTRIN) 200 MG tablet Take 200 mg by mouth every 6 (six) hours as needed (For pain.).    Marland Kitchen MELATONIN-CHAMOMILE PO Take 1 capsule by mouth at bedtime as needed and may repeat dose one time if needed  (For sleep.).     Marland Kitchen polyethylene glycol (MIRALAX / GLYCOLAX) packet Take 17 g by mouth daily. As needed    . [DISCONTINUED] escitalopram (LEXAPRO) 10 MG tablet Take 10 mg by mouth daily.      . [DISCONTINUED] Progesterone Micronized (PROGESTERONE, BULK,) POWD      No current facility-administered medications on file prior to visit.    Allergies  Allergen Reactions  . Astroglide [Gyne-Moistrin] Other (See Comments)    Itching and burning  . Codeine Other (See Comments)    Reaction unknown  . Demerol Nausea And Vomiting  . Penicillins Hives and Other (  See Comments)    Face turned red and had a headache. Has patient had a PCN reaction causing immediate rash, facial/tongue/throat swelling, SOB or lightheadedness with hypotension: no Has patient had a PCN reaction causing severe rash involving mucus membranes or skin necrosis: no Has patient had a PCN reaction that required hospitalization: no Has patient had a PCN reaction occurring within the last 10 years: no If all of the above answers are "NO", then may proceed with Cephalosporin use.  . Shellfish Allergy Other (See Comments)    Intolerance     Review of systems: Constitutional:  She has no weight gain or weight loss. She has no fever or chills. Eyes: No blurred vision Ears, Nose, Mouth, Throat: No dizziness, headaches or changes in hearing. No mouth sores. Cardiovascular: No chest pain, palpitations or edema. Respiratory:  No shortness of breath, wheezing or cough Gastrointestinal: She denies diarrhea. + constipation. She denies any nausea or vomiting. She denies blood in her stool or heart burn. Genitourinary:  She denies pelvic pain, pelvic pressure or changes in her urinary function. She has no hematuria, dysuria, or incontinence. She has no irregular vaginal bleeding or vaginal discharge + genital atrophy Musculoskeletal: Denies muscle weakness or joint pains.  Skin:  She has no skin changes, rashes or itching Neurological:   Denies dizziness or headaches. No neuropathy, no numbness or tingling. Psychiatric:  She denies depression or anxiety. Hematologic/Lymphatic:   No easy bruising or bleeding   Physical Exam: Blood pressure (!) 122/53, pulse 64, temperature 99.2 F (37.3 C), temperature source Oral, resp. rate 18, height 5' 5.4" (1.661 m), weight 135 lb 11.2 oz (61.6 kg), SpO2 97 %. General: Well dressed, well nourished in no apparent distress.   HEENT:  Normocephalic and atraumatic, no lesions.  Extraocular muscles intact. Sclerae anicteric. Pupils equal, round, reactive. No mouth sores or ulcers. Thyroid is normal size, not nodular, midline. Abdomen:  Soft, nontender, nondistended.  No palpable masses.  No hepatosplenomegaly.  No ascites. Normal bowel sounds.  No hernias.  Incisions are well healed. Genitourinary: Normal EGBUS  Vaginal cuff intact.  Atrophy with decreased lubrication and rugation. No bleeding or discharge.  No cul de sac fullness. Extremities: No cyanosis, clubbing or edema.  No calf tenderness or erythema. No palpable cords. Psychiatric: Mood and affect are appropriate. Neurological: Awake, alert and oriented x 3. Sensation is intact, no neuropathy.  Musculoskeletal: No pain, normal strength and range of motion.   25 minutes of direct face to face counseling time was spent with the patient.   Donaciano Eva, MD

## 2016-02-18 NOTE — Patient Instructions (Signed)
Begin using estrace vaginal cream three times a week.  Plan to follow up with Dr. Sondra Come as scheduled and Dr. Denman George in Feb 2018.  Please call our office after you see Dr. Sondra Come in three months to schedule your appointment.

## 2016-02-19 DIAGNOSIS — J3089 Other allergic rhinitis: Secondary | ICD-10-CM | POA: Diagnosis not present

## 2016-02-19 DIAGNOSIS — J301 Allergic rhinitis due to pollen: Secondary | ICD-10-CM | POA: Diagnosis not present

## 2016-02-26 DIAGNOSIS — J3089 Other allergic rhinitis: Secondary | ICD-10-CM | POA: Diagnosis not present

## 2016-02-26 DIAGNOSIS — J301 Allergic rhinitis due to pollen: Secondary | ICD-10-CM | POA: Diagnosis not present

## 2016-02-28 DIAGNOSIS — M810 Age-related osteoporosis without current pathological fracture: Secondary | ICD-10-CM | POA: Diagnosis not present

## 2016-02-29 DIAGNOSIS — Z85828 Personal history of other malignant neoplasm of skin: Secondary | ICD-10-CM | POA: Diagnosis not present

## 2016-02-29 DIAGNOSIS — D2261 Melanocytic nevi of right upper limb, including shoulder: Secondary | ICD-10-CM | POA: Diagnosis not present

## 2016-02-29 DIAGNOSIS — D2262 Melanocytic nevi of left upper limb, including shoulder: Secondary | ICD-10-CM | POA: Diagnosis not present

## 2016-02-29 DIAGNOSIS — L821 Other seborrheic keratosis: Secondary | ICD-10-CM | POA: Diagnosis not present

## 2016-03-04 ENCOUNTER — Other Ambulatory Visit (INDEPENDENT_AMBULATORY_CARE_PROVIDER_SITE_OTHER): Payer: Federal, State, Local not specified - PPO

## 2016-03-04 ENCOUNTER — Encounter: Payer: Self-pay | Admitting: Internal Medicine

## 2016-03-04 ENCOUNTER — Ambulatory Visit (INDEPENDENT_AMBULATORY_CARE_PROVIDER_SITE_OTHER): Payer: Federal, State, Local not specified - PPO | Admitting: Internal Medicine

## 2016-03-04 DIAGNOSIS — M79675 Pain in left toe(s): Secondary | ICD-10-CM

## 2016-03-04 DIAGNOSIS — M2550 Pain in unspecified joint: Secondary | ICD-10-CM | POA: Diagnosis not present

## 2016-03-04 DIAGNOSIS — K219 Gastro-esophageal reflux disease without esophagitis: Secondary | ICD-10-CM | POA: Diagnosis not present

## 2016-03-04 DIAGNOSIS — C541 Malignant neoplasm of endometrium: Secondary | ICD-10-CM | POA: Diagnosis not present

## 2016-03-04 LAB — BASIC METABOLIC PANEL
BUN: 19 mg/dL (ref 6–23)
CO2: 30 mEq/L (ref 19–32)
Calcium: 8.8 mg/dL (ref 8.4–10.5)
Chloride: 104 mEq/L (ref 96–112)
Creatinine, Ser: 0.83 mg/dL (ref 0.40–1.20)
GFR: 74.72 mL/min (ref >60.00)
Glucose, Bld: 95 mg/dL (ref 70–99)
Potassium: 4.3 mEq/L (ref 3.5–5.1)
Sodium: 139 mEq/L (ref 135–145)

## 2016-03-04 LAB — HEPATIC FUNCTION PANEL
ALT: 17 U/L (ref 0–35)
AST: 19 U/L (ref 0–37)
Albumin: 4.2 g/dL (ref 3.5–5.2)
Alkaline Phosphatase: 70 U/L (ref 39–117)
Bilirubin, Direct: 0.1 mg/dL (ref 0.0–0.3)
Total Bilirubin: 0.3 mg/dL (ref 0.2–1.2)
Total Protein: 6.7 g/dL (ref 6.0–8.3)

## 2016-03-04 LAB — URIC ACID: Uric Acid, Serum: 4 mg/dL (ref 2.4–7.0)

## 2016-03-04 MED ORDER — FAMOTIDINE 20 MG PO TABS: 20.0000 mg | ORAL_TABLET | Freq: Every day | ORAL | 2 refills | Status: DC | PRN

## 2016-03-04 MED ORDER — COLCHICINE 0.6 MG PO TABS: 0.6000 mg | ORAL_TABLET | Freq: Four times a day (QID) | ORAL | 1 refills | Status: DC | PRN

## 2016-03-04 MED ORDER — MELOXICAM 7.5 MG PO TABS: 7.5000 mg | ORAL_TABLET | Freq: Every day | ORAL | 2 refills | Status: DC | PRN

## 2016-03-04 NOTE — Assessment & Plan Note (Signed)
Resolving post Reclast adm Will watch

## 2016-03-04 NOTE — Progress Notes (Signed)
Pre visit review using our clinic review tool, if applicable. No additional management support is needed unless otherwise documented below in the visit note. 

## 2016-03-04 NOTE — Assessment & Plan Note (Signed)
r/o gout Labs Colchicine Meloxicam and Pepcid OTC

## 2016-03-04 NOTE — Assessment & Plan Note (Signed)
Rx Pepcid if Meloxicam is needed

## 2016-03-04 NOTE — Assessment & Plan Note (Signed)
08/2015 Hysterectomy+XRT  Discussed

## 2016-03-04 NOTE — Progress Notes (Signed)
Subjective:  Patient ID: [name], female    DOB: 1956-02-05  Age: 60 y.o. MRN: [id number]  CC: Toe Pain (Left 1st toe x 5 days)   HPI [name] presents for L MTP joint pain on 9/1 - better w/Cherry tablets Had a Reclast on 8/31 C/o upset stomach w/NSAIDs  Outpatient Medications Prior to Visit  Medication Sig Dispense Refill  . ALPRAZolam (XANAX) 0.5 MG tablet Take 1 tablet (0.5 mg total) by mouth at bedtime as needed for anxiety. Taking 0.25mg  po 30 tablet 1  . Calcium Citrate-Vitamin D (CALCIUM CITRATE +D PO) Take 2 tablets by mouth daily.     . cetirizine (ZYRTEC) 5 MG tablet Take 5 mg by mouth 2 (two) times daily as needed for allergies.    . Cholecalciferol (VITAMIN D3) 2000 units TABS Take 2,000 Units by mouth daily.    Marland Kitchen EPIPEN 2-PAK 0.3 MG/0.3ML DEVI Inject 0.3 mg into the muscle once. Reported on 09/03/2015    . fluticasone (FLONASE) 50 MCG/ACT nasal spray Place 1 spray into the nose daily as needed for allergies. Reported on 08/06/2015    . ibuprofen (ADVIL,MOTRIN) 200 MG tablet Take 200 mg by mouth every 6 (six) hours as needed (For pain.).    Marland Kitchen levothyroxine (SYNTHROID, LEVOTHROID) 25 MCG tablet Take 25-50 mcg by mouth daily. She takes one tablet daily 4 days per week (Monday-Thursday) and two tablets daily 3 days per week (Friday-Sunday).    Marland Kitchen liothyronine (CYTOMEL) 5 MCG tablet Take 5-10 mcg by mouth 2 (two) times daily. She takes one tablet in the morning and two tablets midday.    Marland Kitchen MELATONIN-CHAMOMILE PO Take 1 capsule by mouth at bedtime as needed and may repeat dose one time if needed (For sleep.).     Marland Kitchen METRONIDAZOLE, TOPICAL, 0.75 % LOTN Apply 1 application topically at bedtime.    . minoxidil (ROGAINE) 2 % external solution Apply 1 application topically 4 (four) times a week.     . Multiple Vitamin (MULTIVITAMIN WITH MINERALS) TABS tablet Take 1 tablet by mouth daily.    . Omega-3 Fatty Acids (FISH OIL) 1000 MG CAPS Take 1 capsule by mouth daily.    Marland Kitchen  pyridoxine (B-6) 200 MG tablet Take 200 mg by mouth daily.    . TURMERIC PO Take 1 capsule by mouth daily.    Marland Kitchen venlafaxine XR (EFFEXOR-XR) 37.5 MG 24 hr capsule TAKE 1 CAPSULE (37.5 MG TOTAL) BY MOUTH DAILY WITH BREAKFAST. 30 capsule 6  . vitamin C (ASCORBIC ACID) 500 MG tablet Take 500 mg by mouth daily.    . polyethylene glycol (MIRALAX / GLYCOLAX) packet Take 17 g by mouth daily. As needed     Facility-Administered Medications Prior to Visit  Medication Dose Route Frequency Provider Last Rate Last Dose  . estradiol (ESTRACE) vaginal cream 1 Applicatorful  1 Applicatorful Vaginal Once per day on Mon Wed Fri Everitt Amber, MD        ROS Review of Systems  Constitutional: Negative for activity change, appetite change, chills, fatigue and unexpected weight change.  HENT: Negative for congestion, mouth sores and sinus pressure.   Eyes: Negative for visual disturbance.  Respiratory: Negative for cough and chest tightness.   Gastrointestinal: Negative for abdominal pain and nausea.  Genitourinary: Negative for difficulty urinating, frequency and vaginal pain.  Musculoskeletal: Positive for arthralgias. Negative for back pain and gait problem.  Skin: Negative for pallor and rash.  Neurological: Negative for dizziness, tremors, weakness, numbness and headaches.  Psychiatric/Behavioral: Negative for confusion and sleep disturbance.    Objective:  BP 112/74   Pulse 67   Temp 98.2 F (36.8 C) (Oral)   Wt 134 lb (60.8 kg)   SpO2 98%   BMI 22.03 kg/m   BP Readings from Last 3 Encounters:  03/04/16 112/74  02/18/16 (!) 122/53  12/06/15 (!) 111/47    Wt Readings from Last 3 Encounters:  03/04/16 134 lb (60.8 kg)  02/18/16 135 lb 11.2 oz (61.6 kg)  12/06/15 133 lb 12.8 oz (60.7 kg)    Physical Exam  Constitutional: She appears well-developed. No distress.  HENT:  Head: Normocephalic.  Right Ear: External ear normal.  Left Ear: External ear normal.  Nose: Nose normal.    Mouth/Throat: Oropharynx is clear and moist.  Eyes: Conjunctivae are normal. Pupils are equal, round, and reactive to light. Right eye exhibits no discharge. Left eye exhibits no discharge.  Neck: Normal range of motion. Neck supple. No JVD present. No tracheal deviation present. No thyromegaly present.  Cardiovascular: Normal rate, regular rhythm and normal heart sounds.   Pulmonary/Chest: No stridor. No respiratory distress. She has no wheezes.  Abdominal: Soft. Bowel sounds are normal. She exhibits no distension and no mass. There is no tenderness. There is no rebound and no guarding.  Musculoskeletal: She exhibits tenderness. She exhibits no edema.  Lymphadenopathy:    She has no cervical adenopathy.  Neurological: She displays normal reflexes. No cranial nerve deficit. She exhibits normal muscle tone. Coordination normal.  Skin: No rash noted. No erythema.  Psychiatric: She has a normal mood and affect. Her behavior is normal. Judgment and thought content normal.   L st toe - painful, warm  FTF>20 min  Lab Results  Component Value Date   WBC 7.1 08/15/2015   HGB 12.3 08/15/2015   HCT 35.8 (L) 08/15/2015   PLT 223 08/15/2015   GLUCOSE 153 (H) 08/15/2015   CHOL 207 (H) 12/01/2014   TRIG 120.0 12/01/2014   HDL 76.00 12/01/2014   LDLDIRECT 102.8 11/11/2010   LDLCALC 107 (H) 12/01/2014   ALT 19 08/10/2015   AST 27 08/10/2015   NA 137 08/15/2015   K 4.1 08/15/2015   CL 105 08/15/2015   CREATININE 0.84 08/15/2015   BUN 14 08/15/2015   CO2 25 08/15/2015   TSH 0.99 11/11/2010    No results found.  Assessment & Plan:   There are no diagnoses linked to this encounter. I am having Ms. [name] maintain her liothyronine, levothyroxine, EPIPEN 2-PAK, minoxidil, fluticasone, Calcium Citrate-Vitamin D (CALCIUM CITRATE +D PO), MELATONIN-CHAMOMILE PO, pyridoxine, TURMERIC PO, ibuprofen, Vitamin D3, multivitamin with minerals, METRONIDAZOLE (TOPICAL), vitamin C, polyethylene glycol,  cetirizine, Fish Oil, venlafaxine XR, ALPRAZolam, and ESTRACE VAGINAL. We will continue to administer estradiol.  Meds ordered this encounter  Medications  . ESTRACE VAGINAL 0.1 MG/GM vaginal cream    Sig: 3 (three) times a week.     Follow-up: No Follow-up on file.  Walker Kehr, MD

## 2016-03-04 NOTE — Patient Instructions (Signed)

## 2016-03-12 DIAGNOSIS — J3089 Other allergic rhinitis: Secondary | ICD-10-CM | POA: Diagnosis not present

## 2016-03-12 DIAGNOSIS — J301 Allergic rhinitis due to pollen: Secondary | ICD-10-CM | POA: Diagnosis not present

## 2016-03-21 ENCOUNTER — Ambulatory Visit (INDEPENDENT_AMBULATORY_CARE_PROVIDER_SITE_OTHER): Payer: Federal, State, Local not specified - PPO | Admitting: Internal Medicine

## 2016-03-21 ENCOUNTER — Ambulatory Visit (INDEPENDENT_AMBULATORY_CARE_PROVIDER_SITE_OTHER)
Admission: RE | Admit: 2016-03-21 | Discharge: 2016-03-21 | Disposition: A | Discharge status: Home / Self Care | Payer: Federal, State, Local not specified - PPO | Source: Ambulatory Visit | Attending: Internal Medicine | Admitting: Internal Medicine

## 2016-03-21 ENCOUNTER — Encounter: Payer: Self-pay | Admitting: Internal Medicine

## 2016-03-21 DIAGNOSIS — M79675 Pain in left toe(s): Secondary | ICD-10-CM

## 2016-03-21 DIAGNOSIS — M7732 Calcaneal spur, left foot: Secondary | ICD-10-CM | POA: Diagnosis not present

## 2016-03-21 DIAGNOSIS — J3089 Other allergic rhinitis: Secondary | ICD-10-CM | POA: Diagnosis not present

## 2016-03-21 DIAGNOSIS — J301 Allergic rhinitis due to pollen: Secondary | ICD-10-CM | POA: Diagnosis not present

## 2016-03-21 IMAGING — DX DG FOOT COMPLETE 3+V*L*
3 series · 3 of 3 positions shown · non-contrast
Comparison: None.

CLINICAL DATA: Left foot pain third metatarsal region for 1 month
and a half, no known injury

EXAM:
LEFT FOOT - COMPLETE 3+ VIEW

[foot ap]
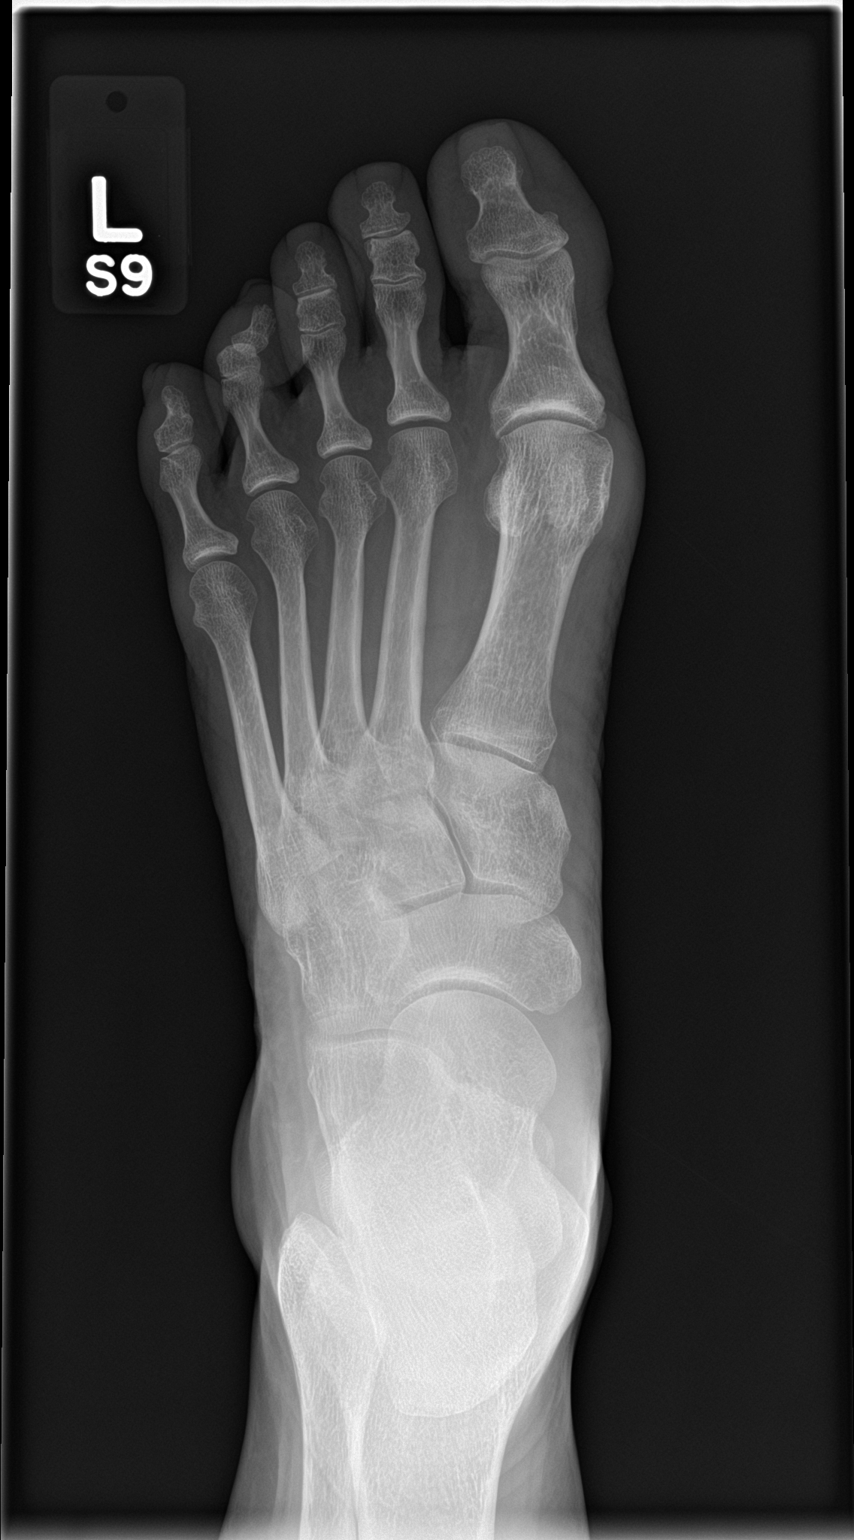

[foot obl]
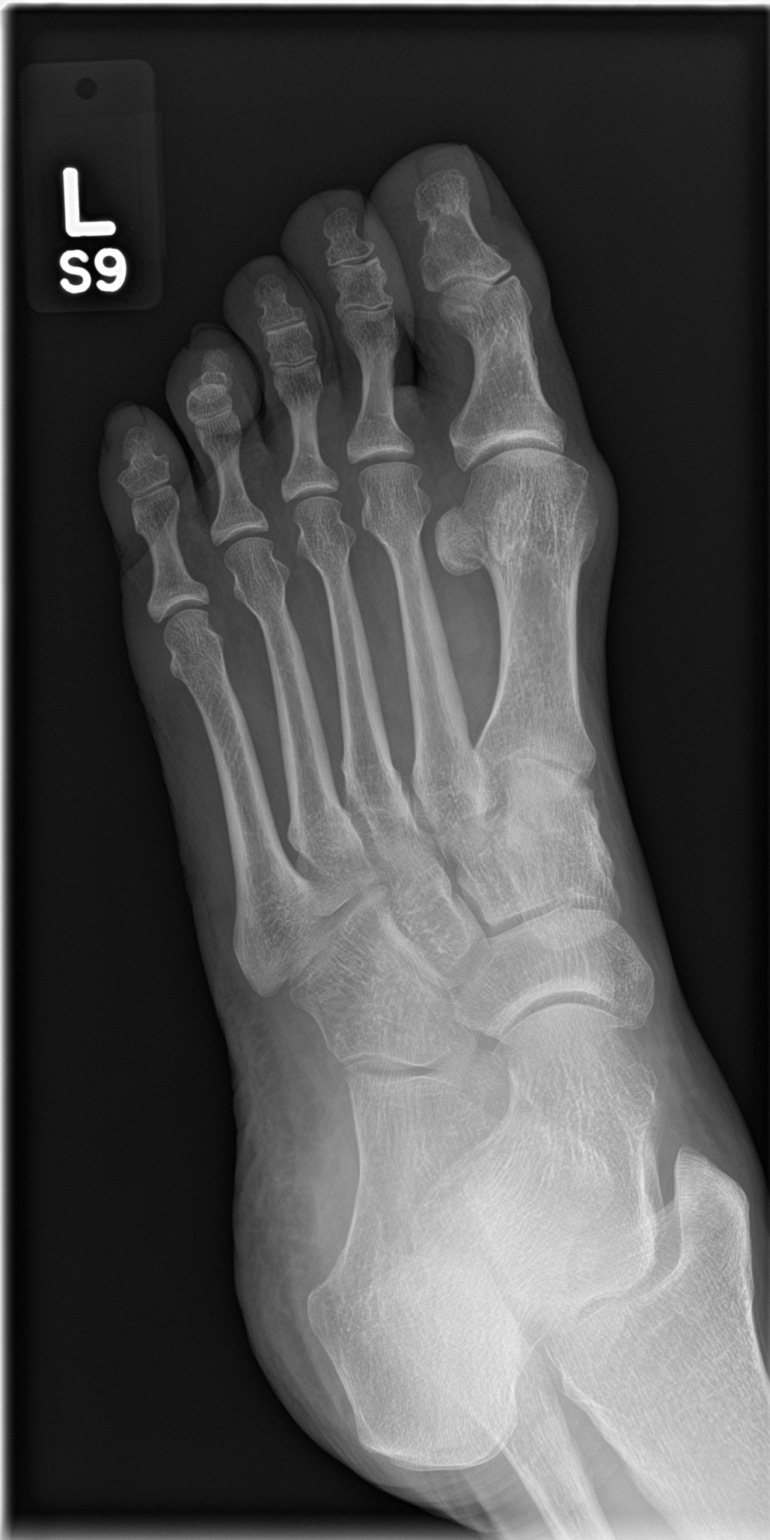

[foot lat]
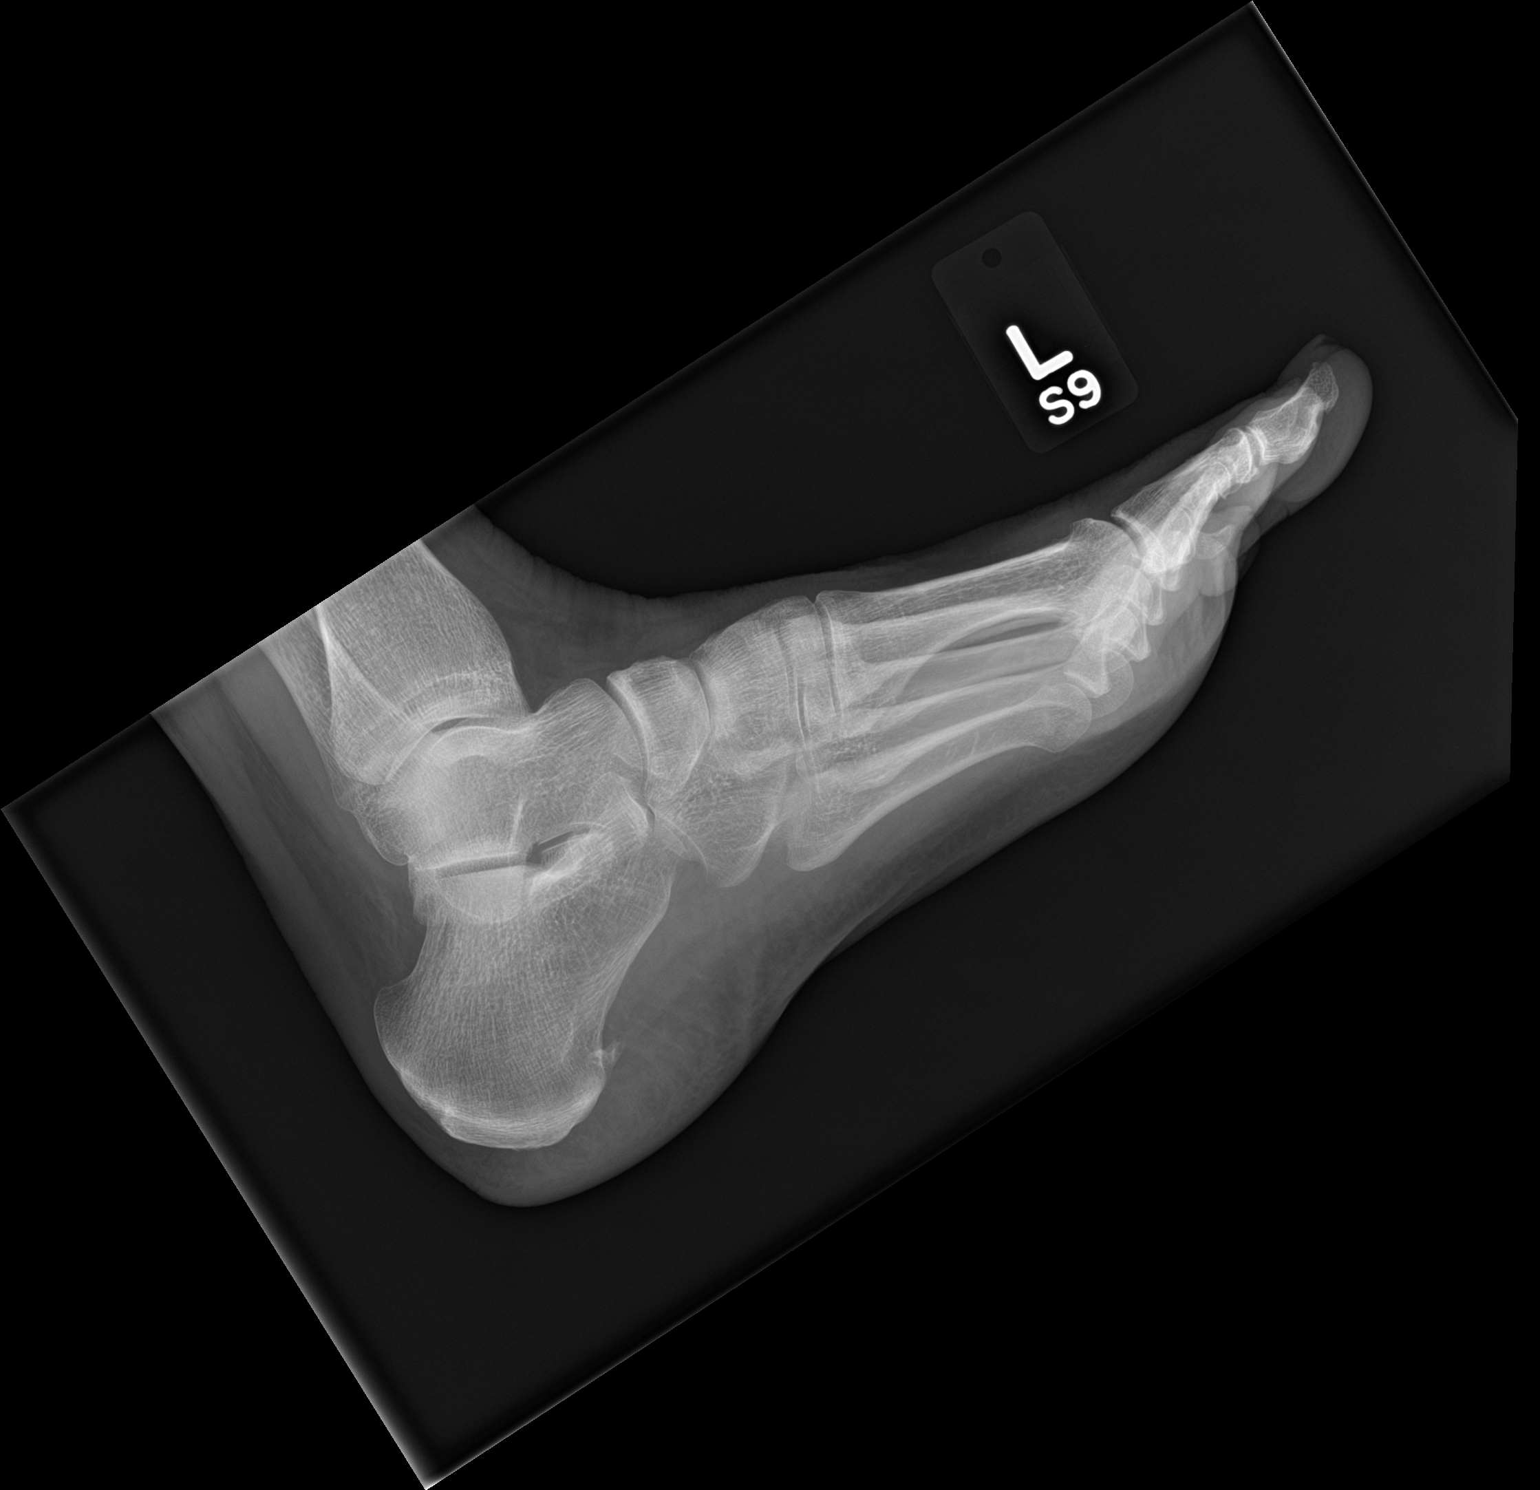

[3 of 3 positions shown; findings below may reference images not displayed]

FINDINGS: Three views of the left foot submitted. No acute fracture or
subluxation. Small plantar spur of calcaneus. No radiopaque foreign
body.
IMPRESSION: No acute fracture or subluxation.  Small plantar spur of calcaneus.

## 2016-03-21 MED ORDER — TRAMADOL-ACETAMINOPHEN 37.5-325 MG PO TABS: 1.0000 | ORAL_TABLET | Freq: Four times a day (QID) | ORAL | 0 refills | Status: DC | PRN

## 2016-03-21 NOTE — Progress Notes (Signed)
Pre visit review using our clinic review tool, if applicable. No additional management support is needed unless otherwise documented below in the visit note. 

## 2016-03-21 NOTE — Progress Notes (Signed)
Subjective:  Patient ID: [name], female    DOB: 20-Mar-1956  Age: 60 y.o. MRN: [id number]  CC: No chief complaint on file.   HPI [name] presents for L mid-foot pain - worse 7/10. 1st MTP - pain is better  Outpatient Medications Prior to Visit  Medication Sig Dispense Refill  . ALPRAZolam (XANAX) 0.5 MG tablet Take 1 tablet (0.5 mg total) by mouth at bedtime as needed for anxiety. Taking 0.25mg  po 30 tablet 1  . Calcium Citrate-Vitamin D (CALCIUM CITRATE +D PO) Take 2 tablets by mouth daily.     . cetirizine (ZYRTEC) 5 MG tablet Take 5 mg by mouth 2 (two) times daily as needed for allergies.    . Cholecalciferol (VITAMIN D3) 2000 units TABS Take 2,000 Units by mouth daily.    . colchicine 0.6 MG tablet Take 1 tablet (0.6 mg total) by mouth 4 (four) times daily as needed. 20 tablet 1  . EPIPEN 2-PAK 0.3 MG/0.3ML DEVI Inject 0.3 mg into the muscle once. Reported on 09/03/2015    . ESTRACE VAGINAL 0.1 MG/GM vaginal cream 3 (three) times a week.    . famotidine (PEPCID) 20 MG tablet Take 1 tablet (20 mg total) by mouth daily as needed for heartburn or indigestion. Take with Meloxicam 30 tablet 2  . fluticasone (FLONASE) 50 MCG/ACT nasal spray Place 1 spray into the nose daily as needed for allergies. Reported on 08/06/2015    . levothyroxine (SYNTHROID, LEVOTHROID) 25 MCG tablet Take 25-50 mcg by mouth daily. She takes one tablet daily 4 days per week (Monday-Thursday) and two tablets daily 3 days per week (Friday-Sunday).    Marland Kitchen liothyronine (CYTOMEL) 5 MCG tablet Take 5-10 mcg by mouth 2 (two) times daily. She takes one tablet in the morning and two tablets midday.    Marland Kitchen MELATONIN-CHAMOMILE PO Take 1 capsule by mouth at bedtime as needed and may repeat dose one time if needed (For sleep.).     Marland Kitchen meloxicam (MOBIC) 7.5 MG tablet Take 1-2 tablets (7.5-15 mg total) by mouth daily as needed for pain. 60 tablet 2  . METRONIDAZOLE, TOPICAL, 0.75 % LOTN Apply 1 application topically at  bedtime.    . minoxidil (ROGAINE) 2 % external solution Apply 1 application topically 4 (four) times a week.     . Multiple Vitamin (MULTIVITAMIN WITH MINERALS) TABS tablet Take 1 tablet by mouth daily.    . Omega-3 Fatty Acids (FISH OIL) 1000 MG CAPS Take 1 capsule by mouth daily.    . polyethylene glycol (MIRALAX / GLYCOLAX) packet Take 17 g by mouth daily. As needed    . pyridoxine (B-6) 200 MG tablet Take 200 mg by mouth daily.    . TURMERIC PO Take 1 capsule by mouth daily.    Marland Kitchen venlafaxine XR (EFFEXOR-XR) 37.5 MG 24 hr capsule TAKE 1 CAPSULE (37.5 MG TOTAL) BY MOUTH DAILY WITH BREAKFAST. 30 capsule 6  . vitamin C (ASCORBIC ACID) 500 MG tablet Take 500 mg by mouth daily.     Facility-Administered Medications Prior to Visit  Medication Dose Route Frequency Provider Last Rate Last Dose  . estradiol (ESTRACE) vaginal cream 1 Applicatorful  1 Applicatorful Vaginal Once per day on Mon Wed Fri Everitt Amber, MD        ROS Review of Systems  Constitutional: Negative for fever and unexpected weight change.  Musculoskeletal: Positive for arthralgias and gait problem. Negative for joint swelling.   L 3d MTP tender; L 1st MTP  is less tender  Objective:  BP 110/70   Pulse 67   Wt 133 lb (60.3 kg)   SpO2 94%   BMI 21.86 kg/m   BP Readings from Last 3 Encounters:  03/21/16 110/70  03/04/16 112/74  02/18/16 (!) 122/53    Wt Readings from Last 3 Encounters:  03/21/16 133 lb (60.3 kg)  03/04/16 134 lb (60.8 kg)  02/18/16 135 lb 11.2 oz (61.6 kg)    Physical Exam  Musculoskeletal: She exhibits tenderness. She exhibits no edema or deformity.  Neurological: Coordination normal.  Skin: No rash noted. No erythema.  Palpation: L 3d MTP tender; L 1st MTP is less tender   Lab Results  Component Value Date   WBC 7.1 08/15/2015   HGB 12.3 08/15/2015   HCT 35.8 (L) 08/15/2015   PLT 223 08/15/2015   GLUCOSE 95 03/04/2016   CHOL 207 (H) 12/01/2014   TRIG 120.0 12/01/2014   HDL 76.00  12/01/2014   LDLDIRECT 102.8 11/11/2010   LDLCALC 107 (H) 12/01/2014   ALT 17 03/04/2016   AST 19 03/04/2016   NA 139 03/04/2016   K 4.3 03/04/2016   CL 104 03/04/2016   CREATININE 0.83 03/04/2016   BUN 19 03/04/2016   CO2 30 03/04/2016   TSH 0.99 11/11/2010    No results found.  Assessment & Plan:   There are no diagnoses linked to this encounter. I am having [name] maintain her liothyronine, levothyroxine, EPIPEN 2-PAK, minoxidil, fluticasone, Calcium Citrate-Vitamin D (CALCIUM CITRATE +D PO), MELATONIN-CHAMOMILE PO, pyridoxine, TURMERIC PO, Vitamin D3, multivitamin with minerals, METRONIDAZOLE (TOPICAL), vitamin C, polyethylene glycol, cetirizine, Fish Oil, venlafaxine XR, ALPRAZolam, ESTRACE VAGINAL, meloxicam, colchicine, and famotidine. We will continue to administer estradiol.  No orders of the defined types were placed in this encounter.    Follow-up: No Follow-up on file.  Walker Kehr, MD

## 2016-03-21 NOTE — Assessment & Plan Note (Addendum)
X ray Ultracet prn - 1/4 or 1/2 tab Sports Med ref pending

## 2016-03-25 DIAGNOSIS — J3089 Other allergic rhinitis: Secondary | ICD-10-CM | POA: Diagnosis not present

## 2016-03-25 DIAGNOSIS — J301 Allergic rhinitis due to pollen: Secondary | ICD-10-CM | POA: Diagnosis not present

## 2016-03-31 NOTE — Progress Notes (Signed)
[name] [place],  [id number] Phone: [phone] Subjective:    I'm seeing this patient by the request  of:  Walker Kehr, MD  CC: Left Toe pain   [id number]  [name] is a 60 y.o. female coming in with complaint of left foot pain. She has had multiple months. Most of the first metatarsal. Hasn't been going on and off midfoot seemed to be worsening over this course of time. Patient states Starting to have more pain on the top of her foot but seems to be the first toe. Patient states unfortunately has some mild numbness associated with it. Patient does run occasionally. States that she enhance nipping and difficulty with hiking recently secondary to pain. Denies any nighttime awakening.   x-rays taken 03/21/2016 were independently visualized by me. Patient has minimal arthritic changes of the midfoot and mild plantar spur of the calcaneal region otherwise unremarkable.  Past Medical History:  Diagnosis Date  . ALLERGIC RHINITIS 03/02/2007  . Anemia   . ANXIETY 03/02/2007  . Arthritis   . ASTHMA 11/12/2007   Dr Orvil Feil  . Cancer (HCC)    skin, hx of  . Dysrhythmia    pt. states at night will notice a different heart rhythem  . Endometrial cancer ([place])   . FIBROCYSTIC BREAST DISEASE, HX OF 03/02/2007  . GERD 03/02/2007  . HYPOTHYROIDISM 11/12/2007   Dr Chalmers Cater  . Neuromuscular disorder (HCC)    carpel tunnel syndrome, compression of ulner nerve at elbows  . OSTEOPENIA 11/12/2007  . PEPTIC ULCER DISEASE 03/02/2007  . Pneumonia    hx. of  . Radiation 09/27/15-10/23/15   HDR to vaginal cuff 30 Gy  . TMJ SYNDROME 11/12/2007   Past Surgical History:  Procedure Laterality Date  . 2008 TMJ surgery     . colonoscopy x 2    . deviated septum surgery - 2007    . DILATION AND CURETTAGE OF UTERUS     2017  . endoscopy - 1990    . Mandib advancement forward  2009  . ROBOTIC ASSISTED TOTAL HYSTERECTOMY WITH BILATERAL SALPINGO  OOPHERECTOMY Bilateral 08/14/2015   Procedure: XI ROBOTIC ASSISTED TOTAL HYSTERECTOMY WITH BILATERAL SALPINGO OOPHORECTOMY AND SENTAL LYMPH NODE BIOPSY;  Surgeon: Everitt Amber, MD;  Location: [facility];  Service: Gynecology;  Laterality: Bilateral;  . 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 Marital status: Married    Spouse name: N/A  . Number of children: N/A  . Years of education: N/A   Occupational History  . 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 Smoking status: Never Smoker  . Smokeless tobacco: Never Used     Comment: Married, 1 child  . Alcohol use 1.2 oz/week    1 Glasses of wine, 1 Cans of beer per week     Comment: daily  . Drug use: No  . Sexual activity: Yes   Other Topics Concern  . Not on file   Social History Narrative  . No narrative on file   Allergies  Allergen Reactions  . Astroglide [Gyne-Moistrin] Other (See Comments)    Itching and burning  . Colchicine     diarrhea  . Demerol Nausea And Vomiting  . Penicillins Hives and Other (See Comments)    Face turned red and had a headache. Has patient had a PCN reaction causing immediate rash,  facial/tongue/throat swelling, SOB or lightheadedness with hypotension: no Has patient had a PCN reaction causing severe rash involving mucus membranes or skin necrosis: no Has patient had a PCN reaction that required hospitalization: no Has patient had a PCN reaction occurring within the last 10 years: no If all of the above answers are "NO", then may proceed with Cephalosporin use.  . Shellfish Allergy Other (See Comments)    Intolerance   Family History  Problem Relation Age of Onset  . Hypertension Father   . Heart disease Father 57    chf  . Heart disease Mother 72    CAD  . Hypertension Other   . Asthma Neg Hx     Past medical history, social, surgical and family history all reviewed in electronic medical record.  No pertanent  information unless stated regarding to the chief complaint.   Review of Systems: No headache, visual changes, nausea, vomiting, diarrhea, constipation, dizziness, abdominal pain, skin rash, fevers, chills, night sweats, weight loss, swollen lymph nodes, body aches, joint swelling, muscle aches, chest pain, shortness of breath, mood changes.   Objective  Blood pressure 118/72, pulse 72, weight 135 lb 9.6 oz (61.5 kg).  General: No apparent distress alert and oriented x3 mood and affect normal, dressed appropriately.  HEENT: Pupils equal, extraocular movements intact  Respiratory: Patient's speak in full sentences and does not appear short of breath  Cardiovascular: No lower extremity edema, non tender, no erythema  Skin: Warm dry intact with no signs of infection or rash on extremities or on axial skeleton.  Abdomen: Soft nontender  Neuro: Cranial nerves II through XII are intact, neurovascularly intact in all extremities with 2+ DTRs and 2+ pulses.  Lymph: No lymphadenopathy of posterior or anterior cervical chain or axillae bilaterally.  Gait normal with good balance and coordination.  MSK:  Non tender with full range of motion and good stability and symmetric strength and tone of shoulders, elbows, wrist, hip, knee and ankles bilaterally.  Foot exam shows patient does have breakdown of the transverse arch bilaterally. Mild bunion and bunionette formation on the left foot. Positive squeeze test. Mild pes cavus the patient does have overpronation of the hindfoot and midfoot.  Limited Musculoskeletal ultrasound was performed and interpreted by Lyndal Pulley  Limited ultrasound patient's foot shows patient does have a neuroma between the third and fourth toes. Otherwise fairly unremarkable. Impression: Morton's neuroma  Procedure note [id number]; 15 minutes spent for Therapeutic exercises as stated in above notes.  This included exercises focusing on stretching, strengthening, with significant  focus on eccentric aspects.   Ankle strengthening that included:  Basic range of motion exercises to allow proper full motion at ankle Stretching of the lower leg and hamstrings  Theraband exercises for the lower leg - inversion, eversion, dorsiflexion and plantarflexion each to be completed with a theraband Balance exercises to increase proprioception Weight bearing exercises to increase strength and balance Proper technique shown and discussed handout in great detail with ATC.  All questions were discussed and answered.      Impression and Recommendations:     This case required medical decision making of moderate complexity.      Note: This dictation was prepared with Dragon dictation along with smaller phrase technology. Any transcriptional errors that result from this process are unintentional.

## 2016-04-01 ENCOUNTER — Encounter: Payer: Self-pay | Admitting: Family Medicine

## 2016-04-01 ENCOUNTER — Other Ambulatory Visit: Payer: Self-pay

## 2016-04-01 ENCOUNTER — Ambulatory Visit: Payer: Federal, State, Local not specified - PPO | Admitting: Family Medicine

## 2016-04-01 ENCOUNTER — Ambulatory Visit (INDEPENDENT_AMBULATORY_CARE_PROVIDER_SITE_OTHER): Payer: Federal, State, Local not specified - PPO | Admitting: Family Medicine

## 2016-04-01 VITALS — BP 118/72 | HR 72 | Wt 135.6 lb

## 2016-04-01 DIAGNOSIS — M216X2 Other acquired deformities of left foot: Secondary | ICD-10-CM

## 2016-04-01 DIAGNOSIS — M79672 Pain in left foot: Secondary | ICD-10-CM | POA: Diagnosis not present

## 2016-04-01 DIAGNOSIS — G5762 Lesion of plantar nerve, left lower limb: Secondary | ICD-10-CM | POA: Insufficient documentation

## 2016-04-01 DIAGNOSIS — M79675 Pain in left toe(s): Secondary | ICD-10-CM | POA: Diagnosis not present

## 2016-04-01 MED ORDER — DICLOFENAC SODIUM 2 % TD SOLN: TRANSDERMAL | 3 refills | Status: DC

## 2016-04-01 NOTE — Patient Instructions (Addendum)
Good to see you.  Ice bath 10 minutes after running and before bed could be good.  Exercises 3 times a week.  [employer] orthotics "total support" online would be great ' Good shoes with rigid bottom.  Jalene Mullet, Merrell or New balance greater then 700, [employer]tons could be a great brand for you  Do not lace the last eye on any of your shoes.  pennsaid pinkie amount topically 2 times daily as needed.  See me again in 4 weeks to make sure you are making progress or we will try injection.

## 2016-04-01 NOTE — Assessment & Plan Note (Signed)
Mild arthritic changes.

## 2016-04-01 NOTE — Assessment & Plan Note (Signed)
Patient does have a neuroma. We'll start with the topical anti-inflammatories, proper shoes, avoiding to be barefoot, discussed icing regimen. Patient does have breakdown of the transverse arch and is going to start working on home exercises that patient learned from Product/process development scientist. Patient will come back and see me again in 3-4 weeks. If worsening symptoms consider injection.

## 2016-04-04 DIAGNOSIS — J301 Allergic rhinitis due to pollen: Secondary | ICD-10-CM | POA: Diagnosis not present

## 2016-04-04 DIAGNOSIS — J3089 Other allergic rhinitis: Secondary | ICD-10-CM | POA: Diagnosis not present

## 2016-04-11 DIAGNOSIS — J3089 Other allergic rhinitis: Secondary | ICD-10-CM | POA: Diagnosis not present

## 2016-04-11 DIAGNOSIS — J301 Allergic rhinitis due to pollen: Secondary | ICD-10-CM | POA: Diagnosis not present

## 2016-04-15 DIAGNOSIS — J301 Allergic rhinitis due to pollen: Secondary | ICD-10-CM | POA: Diagnosis not present

## 2016-04-15 DIAGNOSIS — J3089 Other allergic rhinitis: Secondary | ICD-10-CM | POA: Diagnosis not present

## 2016-04-17 ENCOUNTER — Other Ambulatory Visit: Payer: Federal, State, Local not specified - PPO

## 2016-04-17 DIAGNOSIS — M79672 Pain in left foot: Secondary | ICD-10-CM

## 2016-04-25 DIAGNOSIS — J3089 Other allergic rhinitis: Secondary | ICD-10-CM | POA: Diagnosis not present

## 2016-04-25 DIAGNOSIS — J301 Allergic rhinitis due to pollen: Secondary | ICD-10-CM | POA: Diagnosis not present

## 2016-05-02 DIAGNOSIS — J3089 Other allergic rhinitis: Secondary | ICD-10-CM | POA: Diagnosis not present

## 2016-05-02 DIAGNOSIS — J301 Allergic rhinitis due to pollen: Secondary | ICD-10-CM | POA: Diagnosis not present

## 2016-05-08 ENCOUNTER — Encounter: Payer: Self-pay | Admitting: Radiation Oncology

## 2016-05-08 ENCOUNTER — Ambulatory Visit
Admission: RE | Admit: 2016-05-08 | Discharge: 2016-05-08 | Disposition: A | Discharge status: Home / Self Care | Payer: Federal, State, Local not specified - PPO | Source: Ambulatory Visit | Attending: Radiation Oncology | Admitting: Radiation Oncology

## 2016-05-08 VITALS — BP 118/61 | HR 66 | Temp 97.8°F | Ht 65.4 in | Wt 134.6 lb

## 2016-05-08 DIAGNOSIS — Z888 Allergy status to other drugs, medicaments and biological substances status: Secondary | ICD-10-CM | POA: Insufficient documentation

## 2016-05-08 DIAGNOSIS — Z79899 Other long term (current) drug therapy: Secondary | ICD-10-CM | POA: Insufficient documentation

## 2016-05-08 DIAGNOSIS — Z91013 Allergy to seafood: Secondary | ICD-10-CM | POA: Insufficient documentation

## 2016-05-08 DIAGNOSIS — Z885 Allergy status to narcotic agent status: Secondary | ICD-10-CM | POA: Insufficient documentation

## 2016-05-08 DIAGNOSIS — Z923 Personal history of irradiation: Secondary | ICD-10-CM | POA: Insufficient documentation

## 2016-05-08 DIAGNOSIS — Z880 Allergy status to penicillin: Secondary | ICD-10-CM | POA: Diagnosis not present

## 2016-05-08 DIAGNOSIS — Z08 Encounter for follow-up examination after completed treatment for malignant neoplasm: Secondary | ICD-10-CM | POA: Diagnosis not present

## 2016-05-08 DIAGNOSIS — C541 Malignant neoplasm of endometrium: Secondary | ICD-10-CM | POA: Diagnosis not present

## 2016-05-08 NOTE — Progress Notes (Signed)
Radiation Oncology         [phone] ________________________________  Name: [name] MRN: [id number]  Date: 05/08/2016  DOB: 14-Aug-1955  Follow Up Note  CC: Walker Kehr, MD  Everitt Amber, MD  Diagnosis: Stage IA grade 2 endometrial cancer with LVSI  Indication for treatment:  Post operative       Radiation treatment dates:   09/27/2015-10/23/2015  Site/dose:   The vaginal cuff was treated to 30 Gy in 5 fractions at 6 Gy per fraction.   Narrative: [name] here for reassessment s/p xrt to pelvis for endometrial cancer. Denies any pain at this time. Denies urinary issues. She reports occasional constipation and hard stools. She reports very superficial rectal bleeding when straining. She denies vaginal bleeding. She is using estrace cream and requests a refill. She is not using her dilator, but reports being sexually active.  ALLERGIES:  is allergic to astroglide [gyne-moistrin]; colchicine; demerol; penicillins; and shellfish allergy. Meds: Current Outpatient Prescriptions  Medication Sig Dispense Refill  . Calcium Citrate-Vitamin D (CALCIUM CITRATE +D PO) Take 2 tablets by mouth daily.     . cetirizine (ZYRTEC) 5 MG tablet Take 5 mg by mouth 2 (two) times daily as needed for allergies.    . Cholecalciferol (VITAMIN D3) 2000 units TABS Take 2,000 Units by mouth daily.    Marland Kitchen EPIPEN 2-PAK 0.3 MG/0.3ML DEVI Inject 0.3 mg into the muscle once. Reported on 09/03/2015    . ESTRACE VAGINAL 0.1 MG/GM vaginal cream 3 (three) times a week.    . famotidine (PEPCID) 20 MG tablet Take 1 tablet (20 mg total) by mouth daily as needed for heartburn or indigestion. Take with Meloxicam 30 tablet 2  . fluticasone (FLONASE) 50 MCG/ACT nasal spray Place 1 spray into the nose daily as needed for allergies. Reported on 08/06/2015    . levothyroxine (SYNTHROID, LEVOTHROID) 25 MCG tablet Take 25-50 mcg by mouth daily. She takes one tablet daily 4 days per week (Monday-Thursday) and two tablets daily 3  days per week (Friday-Sunday).    Marland Kitchen liothyronine (CYTOMEL) 5 MCG tablet Take 5-10 mcg by mouth 2 (two) times daily. She takes one tablet in the morning and two tablets midday.    Marland Kitchen MELATONIN-CHAMOMILE PO Take 1 capsule by mouth at bedtime as needed and may repeat dose one time if needed (For sleep.).     Marland Kitchen METRONIDAZOLE, TOPICAL, 0.75 % LOTN Apply 1 application topically at bedtime.    . minoxidil (ROGAINE) 2 % external solution Apply 1 application topically 4 (four) times a week.     . Multiple Vitamin (MULTIVITAMIN WITH MINERALS) TABS tablet Take 1 tablet by mouth daily.    . Omega-3 Fatty Acids (FISH OIL) 1000 MG CAPS Take 1 capsule by mouth daily.    Marland Kitchen pyridoxine (B-6) 200 MG tablet Take 200 mg by mouth daily.    . TURMERIC PO Take 1 capsule by mouth daily.    Marland Kitchen venlafaxine XR (EFFEXOR-XR) 37.5 MG 24 hr capsule TAKE 1 CAPSULE (37.5 MG TOTAL) BY MOUTH DAILY WITH BREAKFAST. 30 capsule 6  . vitamin C (ASCORBIC ACID) 500 MG tablet Take 500 mg by mouth daily.    . vitamin E 400 UNIT capsule Take 400 Units by mouth daily.    Marland Kitchen ALPRAZolam (XANAX) 0.5 MG tablet Take 1 tablet (0.5 mg total) by mouth at bedtime as needed for anxiety. Taking 0.25mg  po (Patient not taking: Reported on 05/08/2016) 30 tablet 1  . Diclofenac Sodium (PENNSAID) 2 % SOLN  Apply 1 pump twice daily. (Patient not taking: Reported on 05/08/2016) 112 g 3  . meloxicam (MOBIC) 7.5 MG tablet Take 1-2 tablets (7.5-15 mg total) by mouth daily as needed for pain. (Patient not taking: Reported on 05/08/2016) 60 tablet 2  . polyethylene glycol (MIRALAX / GLYCOLAX) packet Take 17 g by mouth daily. As needed    . traMADol-acetaminophen (ULTRACET) 37.5-325 MG tablet Take 1 tablet by mouth every 6 (six) hours as needed. (Patient not taking: Reported on 05/08/2016) 60 tablet 0   Current Facility-Administered Medications  Medication Dose Route Frequency Provider Last Rate Last Dose  . estradiol (ESTRACE) vaginal cream 1 Applicatorful  1 Applicatorful  Vaginal Once per day on Mon Wed Fri Everitt Amber, MD        Physical Findings: The patient is in no acute distress. Patient is alert and oriented.  height is 5' 5.4" (1.661 m) and weight is 134 lb 9.6 oz (61.1 kg). Her oral temperature is 97.8 F (36.6 C). Her blood pressure is 118/61 and her pulse is 66. Her oxygen saturation is 100%. . No palpable subclavicular or axillary adenopathy. The lungs are clear to auscultation. The heart has a regular rhythm and rate. The abdomen is soft and nontender with normal bowel sounds. No inguinal adenopathy is appreciated. On pelvic examination the external genitalia are unremarkable. A speculum exam is performed. Patient was noted to have some estrace cream in vaginal vault which was removed for the exam.There are no mucosal lesions noted in the vaginal vault. A Pap smear was obtained of the proximal vagina. On bimanual and rectovaginal examination there no pelvic masses appreciated. Rectal sphincter tone is good.   Lab Findings: Lab Results  Component Value Date   WBC 7.1 08/15/2015   HGB 12.3 08/15/2015   HCT 35.8 (L) 08/15/2015   MCV 90.9 08/15/2015   PLT 223 08/15/2015    Impression:  No evidence of recurrence on clinical exam. Patient is doing better since being placed on vaginal estrogen replacement.  Plan: Patient will follow up with Dr. Denman George 08/17/16 and follow-up with radiation oncology in May 2018. She may switch one of these appointments for her gynecologist Dr. Helane Rima.  -----------------------------------  Blair Promise, PhD, MD  This document serves as a record of services personally performed by Gery Pray, MD. It was created on his behalf by Bethann Humble, a trained medical scribe. The creation of this record is based on the scribe's personal observations and the provider's statements to them. This document has been checked and approved by the attending provider.

## 2016-05-08 NOTE — Progress Notes (Signed)
[name] is here for follow up.  She denies having pain today.  She denies having any urinary issues.  She reports having occasional constipation and hard stools.  She denies having any vaginal bleeding.  She is using estrace cream and is asking for a refill.  She is not using her dilator but is sexually active.    BP 118/61 (BP Location: Right Arm, Patient Position: Sitting)   Pulse 66   Temp 97.8 F (36.6 C) (Oral)   Ht 5' 5.4" (1.661 m)   Wt 134 lb 9.6 oz (61.1 kg)   SpO2 100%   BMI 22.13 kg/m    Wt Readings from Last 3 Encounters:  05/08/16 134 lb 9.6 oz (61.1 kg)  04/01/16 135 lb 9.6 oz (61.5 kg)  03/21/16 133 lb (60.3 kg)

## 2016-05-09 DIAGNOSIS — J301 Allergic rhinitis due to pollen: Secondary | ICD-10-CM | POA: Diagnosis not present

## 2016-05-09 DIAGNOSIS — J3089 Other allergic rhinitis: Secondary | ICD-10-CM | POA: Diagnosis not present

## 2016-05-16 DIAGNOSIS — G5601 Carpal tunnel syndrome, right upper limb: Secondary | ICD-10-CM | POA: Diagnosis not present

## 2016-05-16 DIAGNOSIS — G5603 Carpal tunnel syndrome, bilateral upper limbs: Secondary | ICD-10-CM | POA: Diagnosis not present

## 2016-05-16 DIAGNOSIS — G5602 Carpal tunnel syndrome, left upper limb: Secondary | ICD-10-CM | POA: Diagnosis not present

## 2016-05-20 DIAGNOSIS — J3089 Other allergic rhinitis: Secondary | ICD-10-CM | POA: Diagnosis not present

## 2016-05-20 DIAGNOSIS — J301 Allergic rhinitis due to pollen: Secondary | ICD-10-CM | POA: Diagnosis not present

## 2016-06-03 DIAGNOSIS — J3089 Other allergic rhinitis: Secondary | ICD-10-CM | POA: Diagnosis not present

## 2016-06-03 DIAGNOSIS — J301 Allergic rhinitis due to pollen: Secondary | ICD-10-CM | POA: Diagnosis not present

## 2016-06-11 DIAGNOSIS — J301 Allergic rhinitis due to pollen: Secondary | ICD-10-CM | POA: Diagnosis not present

## 2016-06-11 DIAGNOSIS — J3089 Other allergic rhinitis: Secondary | ICD-10-CM | POA: Diagnosis not present

## 2016-06-13 DIAGNOSIS — Z85828 Personal history of other malignant neoplasm of skin: Secondary | ICD-10-CM | POA: Diagnosis not present

## 2016-06-13 DIAGNOSIS — L565 Disseminated superficial actinic porokeratosis (DSAP): Secondary | ICD-10-CM | POA: Diagnosis not present

## 2016-06-13 DIAGNOSIS — L308 Other specified dermatitis: Secondary | ICD-10-CM | POA: Diagnosis not present

## 2016-06-27 DIAGNOSIS — J3089 Other allergic rhinitis: Secondary | ICD-10-CM | POA: Diagnosis not present

## 2016-06-27 DIAGNOSIS — J301 Allergic rhinitis due to pollen: Secondary | ICD-10-CM | POA: Diagnosis not present

## 2016-07-04 DIAGNOSIS — J3089 Other allergic rhinitis: Secondary | ICD-10-CM | POA: Diagnosis not present

## 2016-07-04 DIAGNOSIS — J301 Allergic rhinitis due to pollen: Secondary | ICD-10-CM | POA: Diagnosis not present

## 2016-07-11 DIAGNOSIS — J3089 Other allergic rhinitis: Secondary | ICD-10-CM | POA: Diagnosis not present

## 2016-07-11 DIAGNOSIS — J301 Allergic rhinitis due to pollen: Secondary | ICD-10-CM | POA: Diagnosis not present

## 2016-07-22 DIAGNOSIS — M810 Age-related osteoporosis without current pathological fracture: Secondary | ICD-10-CM | POA: Diagnosis not present

## 2016-07-22 DIAGNOSIS — E039 Hypothyroidism, unspecified: Secondary | ICD-10-CM | POA: Diagnosis not present

## 2016-07-22 DIAGNOSIS — E559 Vitamin D deficiency, unspecified: Secondary | ICD-10-CM | POA: Diagnosis not present

## 2016-07-24 DIAGNOSIS — J301 Allergic rhinitis due to pollen: Secondary | ICD-10-CM | POA: Diagnosis not present

## 2016-07-24 DIAGNOSIS — J3089 Other allergic rhinitis: Secondary | ICD-10-CM | POA: Diagnosis not present

## 2016-08-04 DIAGNOSIS — J301 Allergic rhinitis due to pollen: Secondary | ICD-10-CM | POA: Diagnosis not present

## 2016-08-04 DIAGNOSIS — J3089 Other allergic rhinitis: Secondary | ICD-10-CM | POA: Diagnosis not present

## 2016-08-06 DIAGNOSIS — J301 Allergic rhinitis due to pollen: Secondary | ICD-10-CM | POA: Diagnosis not present

## 2016-08-06 DIAGNOSIS — J3089 Other allergic rhinitis: Secondary | ICD-10-CM | POA: Diagnosis not present

## 2016-08-07 DIAGNOSIS — K080 Exfoliation of teeth due to systemic causes: Secondary | ICD-10-CM | POA: Diagnosis not present

## 2016-08-11 DIAGNOSIS — G5621 Lesion of ulnar nerve, right upper limb: Secondary | ICD-10-CM | POA: Diagnosis not present

## 2016-08-11 DIAGNOSIS — G5601 Carpal tunnel syndrome, right upper limb: Secondary | ICD-10-CM | POA: Diagnosis not present

## 2016-08-19 DIAGNOSIS — Z6823 Body mass index (BMI) 23.0-23.9, adult: Secondary | ICD-10-CM | POA: Diagnosis not present

## 2016-08-19 DIAGNOSIS — Z01419 Encounter for gynecological examination (general) (routine) without abnormal findings: Secondary | ICD-10-CM | POA: Diagnosis not present

## 2016-08-19 DIAGNOSIS — Z1212 Encounter for screening for malignant neoplasm of rectum: Secondary | ICD-10-CM | POA: Diagnosis not present

## 2016-08-22 DIAGNOSIS — J301 Allergic rhinitis due to pollen: Secondary | ICD-10-CM | POA: Diagnosis not present

## 2016-08-22 DIAGNOSIS — J3089 Other allergic rhinitis: Secondary | ICD-10-CM | POA: Diagnosis not present

## 2016-09-01 DIAGNOSIS — M461 Sacroiliitis, not elsewhere classified: Secondary | ICD-10-CM | POA: Diagnosis not present

## 2016-09-01 DIAGNOSIS — R102 Pelvic and perineal pain: Secondary | ICD-10-CM | POA: Diagnosis not present

## 2016-09-02 DIAGNOSIS — J3089 Other allergic rhinitis: Secondary | ICD-10-CM | POA: Diagnosis not present

## 2016-09-02 DIAGNOSIS — J301 Allergic rhinitis due to pollen: Secondary | ICD-10-CM | POA: Diagnosis not present

## 2016-09-09 DIAGNOSIS — R102 Pelvic and perineal pain: Secondary | ICD-10-CM | POA: Diagnosis not present

## 2016-09-09 DIAGNOSIS — J301 Allergic rhinitis due to pollen: Secondary | ICD-10-CM | POA: Diagnosis not present

## 2016-09-09 DIAGNOSIS — M461 Sacroiliitis, not elsewhere classified: Secondary | ICD-10-CM | POA: Diagnosis not present

## 2016-09-09 DIAGNOSIS — J3089 Other allergic rhinitis: Secondary | ICD-10-CM | POA: Diagnosis not present

## 2016-09-09 DIAGNOSIS — J3081 Allergic rhinitis due to animal (cat) (dog) hair and dander: Secondary | ICD-10-CM | POA: Diagnosis not present

## 2016-09-09 DIAGNOSIS — J4520 Mild intermittent asthma, uncomplicated: Secondary | ICD-10-CM | POA: Diagnosis not present

## 2016-09-16 DIAGNOSIS — M461 Sacroiliitis, not elsewhere classified: Secondary | ICD-10-CM | POA: Diagnosis not present

## 2016-09-16 DIAGNOSIS — J301 Allergic rhinitis due to pollen: Secondary | ICD-10-CM | POA: Diagnosis not present

## 2016-09-16 DIAGNOSIS — R102 Pelvic and perineal pain: Secondary | ICD-10-CM | POA: Diagnosis not present

## 2016-09-16 DIAGNOSIS — J3089 Other allergic rhinitis: Secondary | ICD-10-CM | POA: Diagnosis not present

## 2016-09-23 DIAGNOSIS — M461 Sacroiliitis, not elsewhere classified: Secondary | ICD-10-CM | POA: Diagnosis not present

## 2016-09-23 DIAGNOSIS — R102 Pelvic and perineal pain: Secondary | ICD-10-CM | POA: Diagnosis not present

## 2016-09-23 DIAGNOSIS — J3089 Other allergic rhinitis: Secondary | ICD-10-CM | POA: Diagnosis not present

## 2016-09-23 DIAGNOSIS — J301 Allergic rhinitis due to pollen: Secondary | ICD-10-CM | POA: Diagnosis not present

## 2016-10-02 DIAGNOSIS — J3089 Other allergic rhinitis: Secondary | ICD-10-CM | POA: Diagnosis not present

## 2016-10-02 DIAGNOSIS — J301 Allergic rhinitis due to pollen: Secondary | ICD-10-CM | POA: Diagnosis not present

## 2016-10-07 DIAGNOSIS — J301 Allergic rhinitis due to pollen: Secondary | ICD-10-CM | POA: Diagnosis not present

## 2016-10-07 DIAGNOSIS — J3089 Other allergic rhinitis: Secondary | ICD-10-CM | POA: Diagnosis not present

## 2016-10-10 DIAGNOSIS — J301 Allergic rhinitis due to pollen: Secondary | ICD-10-CM | POA: Diagnosis not present

## 2016-10-10 DIAGNOSIS — J3089 Other allergic rhinitis: Secondary | ICD-10-CM | POA: Diagnosis not present

## 2016-10-16 ENCOUNTER — Encounter: Payer: Self-pay | Admitting: Sports Medicine

## 2016-10-16 ENCOUNTER — Ambulatory Visit (INDEPENDENT_AMBULATORY_CARE_PROVIDER_SITE_OTHER): Payer: Federal, State, Local not specified - PPO | Admitting: Sports Medicine

## 2016-10-16 VITALS — BP 117/31 | Ht 64.5 in | Wt 134.0 lb

## 2016-10-16 DIAGNOSIS — M722 Plantar fascial fibromatosis: Secondary | ICD-10-CM

## 2016-10-16 DIAGNOSIS — M7742 Metatarsalgia, left foot: Secondary | ICD-10-CM | POA: Insufficient documentation

## 2016-10-16 DIAGNOSIS — R102 Pelvic and perineal pain: Secondary | ICD-10-CM | POA: Diagnosis not present

## 2016-10-16 DIAGNOSIS — M216X2 Other acquired deformities of left foot: Secondary | ICD-10-CM | POA: Diagnosis not present

## 2016-10-16 DIAGNOSIS — M461 Sacroiliitis, not elsewhere classified: Secondary | ICD-10-CM | POA: Diagnosis not present

## 2016-10-16 NOTE — Assessment & Plan Note (Signed)
Gave green insoles with scaphoid pads, first 3 posts, metatarsal pad. She'll follow-up in 3-4 weeks for custom orthotics. Will add similar padding if this helps.

## 2016-10-16 NOTE — Progress Notes (Signed)
  [name] - 61 y.o. female MRN [id number]  Date of birth: 30-Mar-1956  SUBJECTIVE:  Including CC & ROS.  CC: bilateral foot pain  Presents with left metatarsal pain in the foot that is been ongoing ongoing for the past few months and is worsening recently. She denies any numbness or tingling distally. It hurts when she is walking on for long periods.  She has used orthotics in the past which have helped. She is also using [employer] which helped.  She had been diagnosed with a Morton's neuroma in the past.    She is having a lot of plantar fascia pain as well. She has had this for a while, but it has worsened recently. She has been doing stretches and ice without relief.   ROS: No unexpected weight loss, fever, chills, swelling, instability, muscle pain, numbness/tingling, redness, otherwise see HPI   PMHx - Updated and reviewed.  Contributory factors include: Negative PSHx - Updated and reviewed.  Contributory factors include:  Negative FHx - Updated and reviewed.  Contributory factors include:  Negative Social Hx - Updated and reviewed. 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  Psych:  alert and oriented  Ankle & Foot: No visible swelling, ecchymosis, erythema, ulcers, calluses, blister Arch: pes cavus  Achilles tendon without nodules or tenderness No swelling of retrocalcaneal bursa TTP over 2nd metatarsal head on left TTP over plantar fascia on right Upon standing she has overpronation Subtalar shift on left Hind foot valgus bilaterally Left fourth digit  externally rotated Full in plantarflexion, dorsiflexion, inversion, and eversion of the foot; flexion and extension of the toes Strength: 5/5 in all directions. Sensation: intact Vascular: intact w/ dorsalis pedis & posterior tibialis pulses 2+ Stable lateral and medial ligaments; Negative Anterior drawer test  Ultrasound: Limited ultrasound of the right foot  Bone spur seen over the plantar aspect of the calcaneus at the insertion of the plantar fascia Plantar fascia with some swelling at insertion and measured to be 0.7 cm Findings consistent with plantar fasciitis  Ultrasound performed and interpreted by Melton Krebs, DO   ASSESSMENT & PLAN:   Plantar fasciitis Arch straps were given. Reviewed exercises again. Recommend returning for custom orthotics. Gave green inserts with scaphoid pads to help with overpronation. Follow-up 3-4 weeks.  Metatarsalgia of left foot Gave green insoles with scaphoid pads, first 3 posts, metatarsal pad. She'll follow-up in 3-4 weeks for custom orthotics. Will add similar padding if this helps.

## 2016-10-16 NOTE — Assessment & Plan Note (Signed)
Arch straps were given. Reviewed exercises again. Recommend returning for custom orthotics. Gave green inserts with scaphoid pads to help with overpronation. Follow-up 3-4 weeks.

## 2016-10-17 DIAGNOSIS — J3089 Other allergic rhinitis: Secondary | ICD-10-CM | POA: Diagnosis not present

## 2016-10-17 DIAGNOSIS — J301 Allergic rhinitis due to pollen: Secondary | ICD-10-CM | POA: Diagnosis not present

## 2016-10-22 DIAGNOSIS — J3089 Other allergic rhinitis: Secondary | ICD-10-CM | POA: Diagnosis not present

## 2016-10-22 DIAGNOSIS — J301 Allergic rhinitis due to pollen: Secondary | ICD-10-CM | POA: Diagnosis not present

## 2016-10-31 DIAGNOSIS — R102 Pelvic and perineal pain: Secondary | ICD-10-CM | POA: Diagnosis not present

## 2016-10-31 DIAGNOSIS — J3089 Other allergic rhinitis: Secondary | ICD-10-CM | POA: Diagnosis not present

## 2016-10-31 DIAGNOSIS — J301 Allergic rhinitis due to pollen: Secondary | ICD-10-CM | POA: Diagnosis not present

## 2016-10-31 DIAGNOSIS — M461 Sacroiliitis, not elsewhere classified: Secondary | ICD-10-CM | POA: Diagnosis not present

## 2016-11-14 DIAGNOSIS — J3089 Other allergic rhinitis: Secondary | ICD-10-CM | POA: Diagnosis not present

## 2016-11-14 DIAGNOSIS — J301 Allergic rhinitis due to pollen: Secondary | ICD-10-CM | POA: Diagnosis not present

## 2016-11-20 DIAGNOSIS — G5621 Lesion of ulnar nerve, right upper limb: Secondary | ICD-10-CM | POA: Diagnosis not present

## 2016-11-20 DIAGNOSIS — M24531 Contracture, right wrist: Secondary | ICD-10-CM | POA: Diagnosis not present

## 2016-11-20 DIAGNOSIS — G5601 Carpal tunnel syndrome, right upper limb: Secondary | ICD-10-CM | POA: Diagnosis not present

## 2016-11-20 HISTORY — PX: OTHER SURGICAL HISTORY: SHX169

## 2016-11-25 ENCOUNTER — Encounter: Payer: Self-pay | Admitting: Sports Medicine

## 2016-11-25 ENCOUNTER — Ambulatory Visit (INDEPENDENT_AMBULATORY_CARE_PROVIDER_SITE_OTHER): Payer: Federal, State, Local not specified - PPO | Admitting: Sports Medicine

## 2016-11-25 DIAGNOSIS — M722 Plantar fascial fibromatosis: Secondary | ICD-10-CM | POA: Diagnosis not present

## 2016-11-25 DIAGNOSIS — M7742 Metatarsalgia, left foot: Secondary | ICD-10-CM | POA: Diagnosis not present

## 2016-11-25 NOTE — Assessment & Plan Note (Signed)
With her work on hard floors and some loss of arch and development of calcaneal valgus we will try custom orthotics  I spent 40 minutes with this patient. Over 50% of visit was spend in counseling and coordination of care for problems with foot pain and using exercises, stretches and orthtoics to control PF sxs

## 2016-11-25 NOTE — Progress Notes (Signed)
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  Sports insoles with MT pad MT pad did help lessen pain in forfoot Arch and midfoot still moderate pain by end of day post inserts  Plantar fasciitis noted by [place] and exam Morning pain is sometimes still present  Less of this but still long arch pain  Past Hx Uterine cancer requiring surgery 08/2015 Hamstring injury 2012/2013 Recent ulnar transposition surgery (Dr. Amedeo Plenty -) on RT  ROS No swelling of feet No numbness  PE Pleasant female/ RT arm in cast/ NAD BP 120/70   Ht 5\' 4"  (1.626 m)   Wt 134 lb (60.8 kg)   BMI 23.00 kg/m    Foot shape is pes cavus but with standing has loss of long arch Bilat. Calcaneal valgus Less TTP over MT 2 on left TTP at insertion of RT PF  Gait pronates with IR of both feet  Patient was fitted for a : standard, cushioned, semi-rigid orthotic. The orthotic was heated and afterward the patient stood on the orthotic blank positioned on the orthotic stand. The patient was positioned in subtalar neutral position and 10 degrees of ankle dorsiflexion in a weight bearing stance. After completion of molding, a stable base was applied to the orthotic blank. The blank was ground to a stable position for weight bearing. Size: 8 Red EVA Base: blue med base EVA Posting:  First ray on RT Additional orthotic padding:  MT padding with small MT pad on left  Gait post orthotics corrected to almost neutral Note - she felt better support and less pain in both feet

## 2016-11-25 NOTE — Assessment & Plan Note (Signed)
Cont to use MT pad on orthotics as this helped her pain

## 2016-12-03 DIAGNOSIS — G5601 Carpal tunnel syndrome, right upper limb: Secondary | ICD-10-CM | POA: Diagnosis not present

## 2016-12-05 DIAGNOSIS — J3089 Other allergic rhinitis: Secondary | ICD-10-CM | POA: Diagnosis not present

## 2016-12-05 DIAGNOSIS — J301 Allergic rhinitis due to pollen: Secondary | ICD-10-CM | POA: Diagnosis not present

## 2016-12-10 DIAGNOSIS — G5601 Carpal tunnel syndrome, right upper limb: Secondary | ICD-10-CM | POA: Diagnosis not present

## 2016-12-17 ENCOUNTER — Ambulatory Visit: Payer: Federal, State, Local not specified - PPO | Attending: Gynecologic Oncology | Admitting: Gynecologic Oncology

## 2016-12-17 ENCOUNTER — Encounter: Payer: Self-pay | Admitting: Gynecologic Oncology

## 2016-12-17 VITALS — BP 106/50 | HR 68 | Temp 97.6°F | Resp 20 | Wt 138.4 lb

## 2016-12-17 DIAGNOSIS — Z08 Encounter for follow-up examination after completed treatment for malignant neoplasm: Secondary | ICD-10-CM | POA: Insufficient documentation

## 2016-12-17 DIAGNOSIS — N949 Unspecified condition associated with female genital organs and menstrual cycle: Secondary | ICD-10-CM

## 2016-12-17 DIAGNOSIS — Z880 Allergy status to penicillin: Secondary | ICD-10-CM | POA: Insufficient documentation

## 2016-12-17 DIAGNOSIS — J45909 Unspecified asthma, uncomplicated: Secondary | ICD-10-CM | POA: Insufficient documentation

## 2016-12-17 DIAGNOSIS — Z8542 Personal history of malignant neoplasm of other parts of uterus: Secondary | ICD-10-CM | POA: Diagnosis not present

## 2016-12-17 DIAGNOSIS — N951 Menopausal and female climacteric states: Secondary | ICD-10-CM | POA: Insufficient documentation

## 2016-12-17 DIAGNOSIS — G5601 Carpal tunnel syndrome, right upper limb: Secondary | ICD-10-CM | POA: Diagnosis not present

## 2016-12-17 DIAGNOSIS — F419 Anxiety disorder, unspecified: Secondary | ICD-10-CM | POA: Insufficient documentation

## 2016-12-17 DIAGNOSIS — Z90710 Acquired absence of both cervix and uterus: Secondary | ICD-10-CM | POA: Diagnosis not present

## 2016-12-17 DIAGNOSIS — E039 Hypothyroidism, unspecified: Secondary | ICD-10-CM | POA: Insufficient documentation

## 2016-12-17 DIAGNOSIS — C541 Malignant neoplasm of endometrium: Secondary | ICD-10-CM

## 2016-12-17 DIAGNOSIS — Z8249 Family history of ischemic heart disease and other diseases of the circulatory system: Secondary | ICD-10-CM | POA: Diagnosis not present

## 2016-12-17 DIAGNOSIS — N952 Postmenopausal atrophic vaginitis: Secondary | ICD-10-CM | POA: Diagnosis not present

## 2016-12-17 MED ORDER — VENLAFAXINE HCL ER 37.5 MG PO CP24: ORAL_CAPSULE | ORAL | 6 refills | Status: DC

## 2016-12-17 MED ORDER — ESTRACE 0.1 MG/GM VA CREA: 1.0000 | TOPICAL_CREAM | VAGINAL | 5 refills | Status: DC

## 2016-12-17 NOTE — Patient Instructions (Signed)
Follow-up with Dr Denman George in December as scheduled.  Please notify Dr Denman George at phone number [phone] if you notice vaginal bleeding, new pelvic or abdominal pains, bloating, feeling full easy, or a change in bladder or bowel function.

## 2016-12-17 NOTE — Progress Notes (Signed)
FOLLOW-UP ENDOMETRIAL CANCER  Assessment and Plan:    61 y.o. year old with Stage IA Grade 2 endometrioid endometrial cancer.   S/p robotic total hysterectomy, BSO, sentinel lymph node biopsy on 08/14/15. Positive LVSI, 12% myometrial invasion, questionably positive pelvic washings (atypical cells) and negative lymph nodes.  1/ history of endometrial cancer: No sign of recurrence Discussed signs and symptoms of recurrence including vaginal bleeding or discharge, leg pain or swelling and changes in bowel or bladder habits. She was given the opportunity to ask questions, which were answered to her satisfaction, and she is agreement with the above mentioned plan of care. She will see Dr Sondra Come in 3 months and return to see me in December 2018. She will see Dr Helane Rima in March, 2019.  2/ Genital atrophy of menopause: recommend vaginal estrace three times weekly. Represcribed  3/ Hot flashes: continue effexor. Represcribed.  HPI:  [name] is a 61 y.o. year old G0 initially seen in consultation in February 2016 referred by Dr Helane Rima for grade 2 endometrial cancer. Her only risk factors for this are the exogenous compounded hormones that she took for menopause and her history of nulliparity.  She then underwent a robotic total hysterectomy on 3/33/54 without complications.  Her postoperative course was uncomplicated.  Her final pathologic diagnosis is a Stage IA Grade 2 endometrioid endometrial cancer with positive lymphovascular space invasion, 2/18 mm (12%) of myometrial invasion and negative lymph nodes. Washings showed atypical cells but these were inconclusive for being malignant.  Due to high/intermediate risk factors she received vaginal brachytherapy (30 Gy in 5 fractions) between 09/27/15 to 10/23/15.  Interval Hx: She is seen today for a routine surveillance visit. She continues to have hot flashes which are minimally controlled with effexor but notes symptoms of reflux with the use of this  medication (and constipation). She has significant deep dyspareunia and sensation of shortened vagina.   Past Medical History:  Diagnosis Date  . ALLERGIC RHINITIS 03/02/2007  . Anemia   . ANXIETY 03/02/2007  . Arthritis   . ASTHMA 11/12/2007   Dr Orvil Feil  . Cancer (HCC)    skin, hx of  . Dysrhythmia    pt. states at night will notice a different heart rhythem  . Endometrial cancer ([place])   . FIBROCYSTIC BREAST DISEASE, HX OF 03/02/2007  . GERD 03/02/2007  . HYPOTHYROIDISM 11/12/2007   Dr Chalmers Cater  . Neuromuscular disorder (HCC)    carpel tunnel syndrome, compression of ulner nerve at elbows  . OSTEOPENIA 11/12/2007  . PEPTIC ULCER DISEASE 03/02/2007  . Pneumonia    hx. of  . Radiation 09/27/15-10/23/15   HDR to vaginal cuff 30 Gy  . TMJ SYNDROME 11/12/2007   Past Surgical History:  Procedure Laterality Date  . 2008 TMJ surgery     . carpel tunnel Right 11/20/2016  . colonoscopy x 2    . cubital tunnel release Right 11/20/2016  . deviated septum surgery - 2007    . DILATION AND CURETTAGE OF UTERUS     2017  . endoscopy - 1990    . Mandib advancement forward  2009  . ROBOTIC ASSISTED TOTAL HYSTERECTOMY WITH BILATERAL SALPINGO OOPHERECTOMY Bilateral 08/14/2015   Procedure: XI ROBOTIC ASSISTED TOTAL HYSTERECTOMY WITH BILATERAL SALPINGO OOPHORECTOMY AND SENTAL LYMPH NODE BIOPSY;  Surgeon: Everitt Amber, MD;  Location: [facility];  Service: Gynecology;  Laterality: Bilateral;  . 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 Hypertension Father   . Heart disease Father 53       chf  . Heart disease Mother 83       CAD  . Hypertension Other   . Asthma Neg Hx    Social History   Social History  . Marital status: Married    Spouse name: N/A  . Number of children: N/A  . Years of education: N/A   Occupational History  . 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 Smoking status: Never Smoker  . Smokeless  tobacco: Never Used     Comment: Married, 1 child  . Alcohol use 1.2 oz/week    1 Glasses of wine, 1 Cans of beer per week     Comment: daily  . Drug use: No  . Sexual activity: Yes   Other Topics Concern  . Not on file   Social History Narrative  . No narrative on file   Current Outpatient Prescriptions on File Prior to Visit  Medication Sig Dispense Refill  . Calcium Citrate-Vitamin D (CALCIUM CITRATE +D PO) Take 2 tablets by mouth daily.     . cetirizine (ZYRTEC) 5 MG tablet Take 5 mg by mouth 2 (two) times daily as needed for allergies.    . Cholecalciferol (VITAMIN D3) 2000 units TABS Take 2,000 Units by mouth daily.    Marland Kitchen EPIPEN 2-PAK 0.3 MG/0.3ML DEVI Inject 0.3 mg into the muscle once. Reported on 09/03/2015    . famotidine (PEPCID) 20 MG tablet Take 1 tablet (20 mg total) by mouth daily as needed for heartburn or indigestion. Take with Meloxicam 30 tablet 2  . fluticasone (FLONASE) 50 MCG/ACT nasal spray Place 1 spray into the nose daily as needed for allergies. Reported on 08/06/2015    . levothyroxine (SYNTHROID, LEVOTHROID) 25 MCG tablet Take 25-50 mcg by mouth daily. She takes one tablet daily 4 days per week (Monday-Thursday) and two tablets daily 3 days per week (Friday-Sunday).    Marland Kitchen METRONIDAZOLE, TOPICAL, 0.75 % LOTN Apply 1 application topically at bedtime.    . minoxidil (ROGAINE) 2 % external solution Apply 1 application topically 4 (four) times a week.     . Multiple Vitamin (MULTIVITAMIN WITH MINERALS) TABS tablet Take 1 tablet by mouth daily.    . Omega-3 Fatty Acids (FISH OIL) 1000 MG CAPS Take 1 capsule by mouth daily.    . polyethylene glycol (MIRALAX / GLYCOLAX) packet Take 17 g by mouth daily. As needed    . pyridoxine (B-6) 200 MG tablet Take 200 mg by mouth daily.    Marland Kitchen ALPRAZolam (XANAX) 0.5 MG tablet Take 1 tablet (0.5 mg total) by mouth at bedtime as needed for anxiety. Taking 0.25mg  po (Patient not taking: Reported on 05/08/2016) 30 tablet 1  . Diclofenac  Sodium (PENNSAID) 2 % SOLN Apply 1 pump twice daily. (Patient not taking: Reported on 05/08/2016) 112 g 3  . liothyronine (CYTOMEL) 5 MCG tablet Take 5-10 mcg by mouth 2 (two) times daily. She takes one tablet in the morning and two tablets midday.    Marland Kitchen MELATONIN-CHAMOMILE PO Take 1 capsule by mouth at bedtime as needed and may repeat dose one time if needed (For sleep.).     Marland Kitchen meloxicam (MOBIC) 7.5 MG tablet Take 1-2 tablets (7.5-15 mg total) by mouth daily as needed for pain. (Patient not taking: Reported on 05/08/2016) 60 tablet 2  . TURMERIC PO Take 1 capsule by mouth daily.    Marland Kitchen  vitamin C (ASCORBIC ACID) 500 MG tablet Take 500 mg by mouth daily.    . vitamin E 400 UNIT capsule Take 400 Units by mouth daily.    . [DISCONTINUED] escitalopram (LEXAPRO) 10 MG tablet Take 10 mg by mouth daily.      . [DISCONTINUED] Progesterone Micronized (PROGESTERONE, BULK,) POWD      Current Facility-Administered Medications on File Prior to Visit  Medication Dose Route Frequency Provider Last Rate Last Dose  . estradiol (ESTRACE) vaginal cream 1 Applicatorful  1 Applicatorful Vaginal Once per day on Mon Wed Fri Everitt Amber, MD       Allergies  Allergen Reactions  . Astroglide [Gyne-Moistrin] Other (See Comments)    Itching and burning  . Colchicine     diarrhea  . Demerol Nausea And Vomiting  . Penicillins Hives and Other (See Comments)    Face turned red and had a headache. Has patient had a PCN reaction causing immediate rash, facial/tongue/throat swelling, SOB or lightheadedness with hypotension: no Has patient had a PCN reaction causing severe rash involving mucus membranes or skin necrosis: no Has patient had a PCN reaction that required hospitalization: no Has patient had a PCN reaction occurring within the last 10 years: no If all of the above answers are "NO", then may proceed with Cephalosporin use.  . Shellfish Allergy Other (See Comments)    Intolerance     Review of  systems: Constitutional:  She has no weight gain or weight loss. She has no fever or chills. Eyes: No blurred vision Ears, Nose, Mouth, Throat: No dizziness, headaches or changes in hearing. No mouth sores. Cardiovascular: No chest pain, palpitations or edema. Respiratory:  No shortness of breath, wheezing or cough Gastrointestinal: She denies diarrhea. + constipation. She denies any nausea or vomiting. She denies blood in her stool or heart burn. Genitourinary:  She denies pelvic pain, pelvic pressure or changes in her urinary function. She has no hematuria, dysuria, or incontinence. She has no irregular vaginal bleeding or vaginal discharge + genital atrophy Musculoskeletal: Denies muscle weakness or joint pains.  Skin:  She has no skin changes, rashes or itching Neurological:  Denies dizziness or headaches. No neuropathy, no numbness or tingling. Psychiatric:  She denies depression or anxiety. Hematologic/Lymphatic:   No easy bruising or bleeding   Physical Exam: Blood pressure (!) 106/50, pulse 68, temperature 97.6 F (36.4 C), temperature source Oral, resp. rate 20, weight 138 lb 6.4 oz (62.8 kg), SpO2 96 %. General: Well dressed, well nourished in no apparent distress.   HEENT:  Normocephalic and atraumatic, no lesions.  Extraocular muscles intact. Sclerae anicteric. Pupils equal, round, reactive. No mouth sores or ulcers. Thyroid is normal size, not nodular, midline. Abdomen:  Soft, nontender, nondistended.  No palpable masses.  No hepatosplenomegaly.  No ascites. Normal bowel sounds.  No hernias.  Incisions are well healed. Genitourinary: Normal EGBUS  Vaginal cuff intact.  Atrophy with decreased lubrication and rugation. No bleeding or discharge.  No cul de sac fullness. Extremities: No cyanosis, clubbing or edema.  No calf tenderness or erythema. No palpable cords. Psychiatric: Mood and affect are appropriate. Neurological: Awake, alert and oriented x 3. Sensation is intact, no  neuropathy.  Musculoskeletal: No pain, normal strength and range of motion.   Donaciano Eva, MD

## 2016-12-30 DIAGNOSIS — G5601 Carpal tunnel syndrome, right upper limb: Secondary | ICD-10-CM | POA: Diagnosis not present

## 2017-01-19 DIAGNOSIS — M810 Age-related osteoporosis without current pathological fracture: Secondary | ICD-10-CM | POA: Diagnosis not present

## 2017-02-04 ENCOUNTER — Encounter: Payer: Self-pay | Admitting: Internal Medicine

## 2017-02-04 ENCOUNTER — Ambulatory Visit (INDEPENDENT_AMBULATORY_CARE_PROVIDER_SITE_OTHER): Payer: Federal, State, Local not specified - PPO | Admitting: Internal Medicine

## 2017-02-04 VITALS — BP 122/80 | HR 67 | Temp 99.1°F | Ht 64.0 in | Wt 142.0 lb

## 2017-02-04 DIAGNOSIS — J4521 Mild intermittent asthma with (acute) exacerbation: Secondary | ICD-10-CM

## 2017-02-04 DIAGNOSIS — J069 Acute upper respiratory infection, unspecified: Secondary | ICD-10-CM | POA: Diagnosis not present

## 2017-02-04 MED ORDER — FLUTICASONE FUROATE-VILANTEROL 100-25 MCG/INH IN AEPB: 1.0000 | INHALATION_SPRAY | Freq: Every day | RESPIRATORY_TRACT | 5 refills | Status: DC

## 2017-02-04 MED ORDER — AZITHROMYCIN 250 MG PO TABS: ORAL_TABLET | ORAL | 0 refills | Status: DC

## 2017-02-04 MED ORDER — METHYLPREDNISOLONE 4 MG PO TBPK: ORAL_TABLET | ORAL | 0 refills | Status: DC

## 2017-02-04 NOTE — Progress Notes (Signed)
Subjective:  Patient ID: [name], female    DOB: 1956/04/24  Age: 61 y.o. MRN: [id number]  CC: No chief complaint on file.   HPI [name] presents for sinusitis sx's, wheezing, cough Worse at night  Outpatient Medications Prior to Visit  Medication Sig Dispense Refill  . Calcium Citrate-Vitamin D (CALCIUM CITRATE +D PO) Take 2 tablets by mouth daily.     . cetirizine (ZYRTEC) 5 MG tablet Take 5 mg by mouth 2 (two) times daily as needed for allergies.    . Cholecalciferol (VITAMIN D3) 2000 units TABS Take 2,000 Units by mouth daily.    Marland Kitchen EPIPEN 2-PAK 0.3 MG/0.3ML DEVI Inject 0.3 mg into the muscle once. Reported on 09/03/2015    . ESTRACE VAGINAL 0.1 MG/GM vaginal cream Place 1 Applicatorful vaginally 3 (three) times a week. 42.5 g 5  . famotidine (PEPCID) 20 MG tablet Take 1 tablet (20 mg total) by mouth daily as needed for heartburn or indigestion. Take with Meloxicam 30 tablet 2  . fluticasone (FLONASE) 50 MCG/ACT nasal spray Place 1 spray into the nose daily as needed for allergies. Reported on 08/06/2015    . levothyroxine (SYNTHROID, LEVOTHROID) 25 MCG tablet Take 25-50 mcg by mouth daily. She takes one tablet daily 4 days per week (Monday-Thursday) and two tablets daily 3 days per week (Friday-Sunday).    Marland Kitchen liothyronine (CYTOMEL) 5 MCG tablet Take 5-10 mcg by mouth 2 (two) times daily. She takes one tablet in the morning and two tablets midday.    Marland Kitchen MELATONIN-CHAMOMILE PO Take 1 capsule by mouth at bedtime as needed and may repeat dose one time if needed (For sleep.).     Marland Kitchen METRONIDAZOLE, TOPICAL, 0.75 % LOTN Apply 1 application topically at bedtime.    . minoxidil (ROGAINE) 2 % external solution Apply 1 application topically 4 (four) times a week.     . Multiple Vitamin (MULTIVITAMIN WITH MINERALS) TABS tablet Take 1 tablet by mouth daily.    . Omega-3 Fatty Acids (FISH OIL) 1000 MG CAPS Take 1 capsule by mouth daily.    . polyethylene glycol (MIRALAX / GLYCOLAX) packet  Take 17 g by mouth daily. As needed    . pyridoxine (B-6) 200 MG tablet Take 200 mg by mouth daily.    . TURMERIC PO Take 1 capsule by mouth daily.    Marland Kitchen venlafaxine XR (EFFEXOR-XR) 37.5 MG 24 hr capsule TAKE 1 CAPSULE (37.5 MG TOTAL) BY MOUTH DAILY WITH BREAKFAST. 30 capsule 6  . vitamin C (ASCORBIC ACID) 500 MG tablet Take 500 mg by mouth daily.    . vitamin E 400 UNIT capsule Take 400 Units by mouth daily.    Marland Kitchen ALPRAZolam (XANAX) 0.5 MG tablet Take 1 tablet (0.5 mg total) by mouth at bedtime as needed for anxiety. Taking 0.25mg  po (Patient not taking: Reported on 05/08/2016) 30 tablet 1  . Diclofenac Sodium (PENNSAID) 2 % SOLN Apply 1 pump twice daily. (Patient not taking: Reported on 05/08/2016) 112 g 3  . meloxicam (MOBIC) 7.5 MG tablet Take 1-2 tablets (7.5-15 mg total) by mouth daily as needed for pain. (Patient not taking: Reported on 05/08/2016) 60 tablet 2   Facility-Administered Medications Prior to Visit  Medication Dose Route Frequency Provider Last Rate Last Dose  . estradiol (ESTRACE) vaginal cream 1 Applicatorful  1 Applicatorful Vaginal Once per day on Mon Wed Fri Everitt Amber, MD        ROS Review of Systems  Constitutional: Positive for  fatigue. Negative for activity change, appetite change, chills and unexpected weight change.  HENT: Positive for rhinorrhea, sinus pain and sinus pressure. Negative for congestion and mouth sores.   Eyes: Negative for visual disturbance.  Respiratory: Positive for cough and wheezing. Negative for chest tightness.   Gastrointestinal: Negative for abdominal pain and nausea.  Genitourinary: Negative for difficulty urinating, frequency and vaginal pain.  Musculoskeletal: Negative for back pain and gait problem.  Skin: Negative for pallor and rash.  Neurological: Negative for dizziness, tremors, weakness, numbness and headaches.  Psychiatric/Behavioral: Negative for confusion and sleep disturbance.    Objective:  BP 122/80 (BP Location: Left  Arm, Patient Position: Sitting, Cuff Size: Normal)   Pulse 67   Temp 99.1 F (37.3 C) (Oral)   Ht 5\' 4"  (1.626 m)   Wt 142 lb (64.4 kg)   SpO2 99%   BMI 24.37 kg/m   BP Readings from Last 3 Encounters:  02/04/17 122/80  12/17/16 (!) 106/50  11/25/16 120/70    Wt Readings from Last 3 Encounters:  02/04/17 142 lb (64.4 kg)  12/17/16 138 lb 6.4 oz (62.8 kg)  11/25/16 134 lb (60.8 kg)    Physical Exam  Constitutional: She appears well-developed. No distress.  HENT:  Head: Normocephalic.  Right Ear: External ear normal.  Left Ear: External ear normal.  Nose: Nose normal.  Mouth/Throat: Oropharynx is clear and moist.  Eyes: Pupils are equal, round, and reactive to light. Conjunctivae are normal. Right eye exhibits no discharge. Left eye exhibits no discharge.  Neck: Normal range of motion. Neck supple. No JVD present. No tracheal deviation present. No thyromegaly present.  Cardiovascular: Normal rate, regular rhythm and normal heart sounds.   Pulmonary/Chest: No stridor. No respiratory distress. She has no wheezes.  Abdominal: Soft. Bowel sounds are normal. She exhibits no distension and no mass. There is no tenderness. There is no rebound and no guarding.  Musculoskeletal: She exhibits no edema or tenderness.  Lymphadenopathy:    She has no cervical adenopathy.  Neurological: She displays normal reflexes. No cranial nerve deficit. She exhibits normal muscle tone. Coordination normal.  Skin: No rash noted. No erythema.  Psychiatric: She has a normal mood and affect. Her behavior is normal. Judgment and thought content normal.  eryth thrat  I personally provided Breo inhaler use teaching. After the teaching patient was able to demonstrate it's use effectively. All questions were answered   Lab Results  Component Value Date   WBC 7.1 08/15/2015   HGB 12.3 08/15/2015   HCT 35.8 (L) 08/15/2015   PLT 223 08/15/2015   GLUCOSE 95 03/04/2016   CHOL 207 (H) 12/01/2014   TRIG  120.0 12/01/2014   HDL 76.00 12/01/2014   LDLDIRECT 102.8 11/11/2010   LDLCALC 107 (H) 12/01/2014   ALT 17 03/04/2016   AST 19 03/04/2016   NA 139 03/04/2016   K 4.3 03/04/2016   CL 104 03/04/2016   CREATININE 0.83 03/04/2016   BUN 19 03/04/2016   CO2 30 03/04/2016   TSH 0.99 11/11/2010    No results found.  Assessment & Plan:   There are no diagnoses linked to this encounter. I have discontinued Ms. Achor's ALPRAZolam, meloxicam, and Diclofenac Sodium. I am also having her maintain her liothyronine, levothyroxine, EPIPEN 2-PAK, minoxidil, fluticasone, Calcium Citrate-Vitamin D (CALCIUM CITRATE +D PO), MELATONIN-CHAMOMILE PO, pyridoxine, TURMERIC PO, Vitamin D3, multivitamin with minerals, METRONIDAZOLE (TOPICAL), vitamin C, polyethylene glycol, cetirizine, Fish Oil, famotidine, vitamin E, ESTRACE VAGINAL, venlafaxine XR, and vitamin B-12. We  will continue to administer estradiol.  Meds ordered this encounter  Medications  . vitamin B-12 (CYANOCOBALAMIN) 1000 MCG tablet    Sig: Take 1,000 mcg by mouth daily.     Follow-up: No Follow-up on file.  Walker Kehr, MD

## 2017-02-04 NOTE — Assessment & Plan Note (Signed)
Exacerbation CXR if worse Breo  Medrol pack

## 2017-02-04 NOTE — Assessment & Plan Note (Signed)
Zpac 

## 2017-02-04 NOTE — Patient Instructions (Signed)
You can use over-the-counter  "cold" medicines  such as "Tylenol cold" , "Advil cold",  "Mucinex" or" Mucinex D"  for cough and congestion.   Avoid decongestants if you have high blood pressure and use "Afrin" nasal spray for nasal congestion as directed. Use " Delsym" or" Robitussin" cough syrup varietis for cough.  You can use plain "Tylenol" or "Advil" for fever, chills and achyness. Use Halls or Ricola cough drops.   Please, make an appointment if you are not better or if you're worse.  

## 2017-02-05 ENCOUNTER — Ambulatory Visit: Payer: Self-pay | Admitting: Radiation Oncology

## 2017-02-10 DIAGNOSIS — K080 Exfoliation of teeth due to systemic causes: Secondary | ICD-10-CM | POA: Diagnosis not present

## 2017-02-11 DIAGNOSIS — J3089 Other allergic rhinitis: Secondary | ICD-10-CM | POA: Diagnosis not present

## 2017-02-11 DIAGNOSIS — J301 Allergic rhinitis due to pollen: Secondary | ICD-10-CM | POA: Diagnosis not present

## 2017-02-13 DIAGNOSIS — J301 Allergic rhinitis due to pollen: Secondary | ICD-10-CM | POA: Diagnosis not present

## 2017-02-13 DIAGNOSIS — J3089 Other allergic rhinitis: Secondary | ICD-10-CM | POA: Diagnosis not present

## 2017-02-18 DIAGNOSIS — J3089 Other allergic rhinitis: Secondary | ICD-10-CM | POA: Diagnosis not present

## 2017-02-18 DIAGNOSIS — J301 Allergic rhinitis due to pollen: Secondary | ICD-10-CM | POA: Diagnosis not present

## 2017-02-20 DIAGNOSIS — J301 Allergic rhinitis due to pollen: Secondary | ICD-10-CM | POA: Diagnosis not present

## 2017-02-20 DIAGNOSIS — J3089 Other allergic rhinitis: Secondary | ICD-10-CM | POA: Diagnosis not present

## 2017-02-25 DIAGNOSIS — J3089 Other allergic rhinitis: Secondary | ICD-10-CM | POA: Diagnosis not present

## 2017-02-25 DIAGNOSIS — J301 Allergic rhinitis due to pollen: Secondary | ICD-10-CM | POA: Diagnosis not present

## 2017-03-04 DIAGNOSIS — J3089 Other allergic rhinitis: Secondary | ICD-10-CM | POA: Diagnosis not present

## 2017-03-04 DIAGNOSIS — J301 Allergic rhinitis due to pollen: Secondary | ICD-10-CM | POA: Diagnosis not present

## 2017-03-09 DIAGNOSIS — M810 Age-related osteoporosis without current pathological fracture: Secondary | ICD-10-CM | POA: Diagnosis not present

## 2017-03-12 ENCOUNTER — Telehealth: Payer: Self-pay | Admitting: Oncology

## 2017-03-12 ENCOUNTER — Ambulatory Visit
Admission: RE | Admit: 2017-03-12 | Discharge: 2017-03-12 | Disposition: A | Discharge status: Home / Self Care | Payer: Federal, State, Local not specified - PPO | Source: Ambulatory Visit | Attending: Radiation Oncology | Admitting: Radiation Oncology

## 2017-03-12 DIAGNOSIS — Z8542 Personal history of malignant neoplasm of other parts of uterus: Secondary | ICD-10-CM | POA: Insufficient documentation

## 2017-03-12 DIAGNOSIS — Z923 Personal history of irradiation: Secondary | ICD-10-CM | POA: Insufficient documentation

## 2017-03-12 DIAGNOSIS — Z08 Encounter for follow-up examination after completed treatment for malignant neoplasm: Secondary | ICD-10-CM | POA: Insufficient documentation

## 2017-03-12 NOTE — Telephone Encounter (Signed)
Left a message for patient regarding her follow up with Dr. Kinard today.  Requested a return call. 

## 2017-03-16 ENCOUNTER — Ambulatory Visit
Admission: RE | Admit: 2017-03-16 | Discharge: 2017-03-16 | Disposition: A | Discharge status: Home / Self Care | Payer: Federal, State, Local not specified - PPO | Source: Ambulatory Visit | Attending: Radiation Oncology | Admitting: Radiation Oncology

## 2017-03-16 ENCOUNTER — Encounter: Payer: Self-pay | Admitting: Radiation Oncology

## 2017-03-16 VITALS — BP 108/64 | HR 68 | Temp 98.2°F | Ht 64.0 in | Wt 140.2 lb

## 2017-03-16 DIAGNOSIS — Z8541 Personal history of malignant neoplasm of cervix uteri: Secondary | ICD-10-CM | POA: Diagnosis not present

## 2017-03-16 DIAGNOSIS — Z8542 Personal history of malignant neoplasm of other parts of uterus: Secondary | ICD-10-CM | POA: Diagnosis not present

## 2017-03-16 DIAGNOSIS — N393 Stress incontinence (female) (male): Secondary | ICD-10-CM | POA: Diagnosis not present

## 2017-03-16 DIAGNOSIS — Z923 Personal history of irradiation: Secondary | ICD-10-CM | POA: Diagnosis not present

## 2017-03-16 DIAGNOSIS — B373 Candidiasis of vulva and vagina: Secondary | ICD-10-CM | POA: Diagnosis not present

## 2017-03-16 DIAGNOSIS — C541 Malignant neoplasm of endometrium: Secondary | ICD-10-CM

## 2017-03-16 DIAGNOSIS — Z08 Encounter for follow-up examination after completed treatment for malignant neoplasm: Secondary | ICD-10-CM | POA: Diagnosis not present

## 2017-03-16 MED ORDER — FLUCONAZOLE 150 MG PO TABS: 150.0000 mg | ORAL_TABLET | Freq: Every day | ORAL | 0 refills | Status: DC

## 2017-03-16 NOTE — Progress Notes (Signed)
Radiation Oncology         [phone] ________________________________  Name: [name] MRN: [id number]  Date: 03/16/2017  DOB: May 13, 1956    Follow-Up Visit Note  CC: Plotnikov, [name], MD  Everitt Amber, MD    ICD-10-CM   1. Endometrial adenocarcinoma (HCC) C54.1     Diagnosis:   Stage IA grade 2 endometrial cancer with LVSI  Interval Since Last Radiation: 1 year, 4.5 months  Radiation treatment dates:   09/27/2015-10/23/2015 Site/dose:   The vaginal cuff was treated to 30 Gy in 5 fractions at 6 Gy per fraction.   Narrative:  The patient returns today for routine follow-up.  She denies having pain. She reports having occasional stress incontinence that started after radiation. This happens for example when she sneezes. The patient is interested in pelvic floor rehab. She denies having any bowel issues or vaginal bleeding/discharge. She is not using a dilator but is using estrace cream. She denies vaginal bleeding with sexual intercourse. She reports having occasional fatigue and is taking B12. She was last seen by Dr. Denman George on 12/17/16. She is scheduled to see Dr. Denman George again on 06/17/17.                               ALLERGIES:  is allergic to astroglide [gyne-moistrin]; colchicine; demerol; penicillins; and shellfish allergy.  Meds: Current Outpatient Prescriptions  Medication Sig Dispense Refill  . Calcium Citrate-Vitamin D (CALCIUM CITRATE +D PO) Take 2 tablets by mouth daily.     . cetirizine (ZYRTEC) 5 MG tablet Take 5 mg by mouth 2 (two) times daily as needed for allergies.    . Cholecalciferol (VITAMIN D3) 2000 units TABS Take 2,000 Units by mouth daily.    Marland Kitchen EPIPEN 2-PAK 0.3 MG/0.3ML DEVI Inject 0.3 mg into the muscle once. Reported on 09/03/2015    . ESTRACE VAGINAL 0.1 MG/GM vaginal cream Place 1 Applicatorful vaginally 3 (three) times a week. 42.5 g 5  . famotidine (PEPCID) 20 MG tablet Take 1 tablet (20 mg total) by mouth daily as needed for heartburn or  indigestion. Take with Meloxicam 30 tablet 2  . fluticasone (FLONASE) 50 MCG/ACT nasal spray Place 1 spray into the nose daily as needed for allergies. Reported on 08/06/2015    . levothyroxine (SYNTHROID, LEVOTHROID) 25 MCG tablet Take 25-50 mcg by mouth daily. She takes one tablet daily 4 days per week (Monday-Thursday) and two tablets daily 3 days per week (Friday-Sunday).    Marland Kitchen liothyronine (CYTOMEL) 5 MCG tablet Take 5-10 mcg by mouth 2 (two) times daily. She takes one tablet in the morning and two tablets midday.    Marland Kitchen MELATONIN-CHAMOMILE PO Take 1 capsule by mouth at bedtime as needed and may repeat dose one time if needed (For sleep.).     Marland Kitchen METRONIDAZOLE, TOPICAL, 0.75 % LOTN Apply 1 application topically at bedtime.    . Multiple Vitamin (MULTIVITAMIN WITH MINERALS) TABS tablet Take 1 tablet by mouth daily.    . Omega-3 Fatty Acids (FISH OIL) 1000 MG CAPS Take 1 capsule by mouth daily.    . polyethylene glycol (MIRALAX / GLYCOLAX) packet Take 17 g by mouth daily. As needed    . pyridoxine (B-6) 200 MG tablet Take 200 mg by mouth daily.    . TURMERIC PO Take 1 capsule by mouth daily.    Marland Kitchen venlafaxine XR (EFFEXOR-XR) 37.5 MG 24 hr capsule TAKE 1 CAPSULE (37.5 MG  TOTAL) BY MOUTH DAILY WITH BREAKFAST. 30 capsule 6  . vitamin B-12 (CYANOCOBALAMIN) 1000 MCG tablet Take 1,000 mcg by mouth daily.    . vitamin C (ASCORBIC ACID) 500 MG tablet Take 500 mg by mouth daily.    . vitamin E 400 UNIT capsule Take 400 Units by mouth daily.    . minoxidil (ROGAINE) 2 % external solution Apply 1 application topically 4 (four) times a week.      Current Facility-Administered Medications  Medication Dose Route Frequency Provider Last Rate Last Dose  . estradiol (ESTRACE) vaginal cream 1 Applicatorful  1 Applicatorful Vaginal Once per day on Mon Wed Fri Everitt Amber, MD        Physical Findings: The patient is in no acute distress. Patient is alert and oriented.  height is 5\' 4"  (1.626 m) and weight is 140 lb  3.2 oz (63.6 kg). Her oral temperature is 98.2 F (36.8 C). Her blood pressure is 108/64 and her pulse is 68. Her oxygen saturation is 100%. .  Lungs are clear to auscultation bilaterally. Heart has regular rate and rhythm. No palpable cervical, supraclavicular, or axillary adenopathy. Abdomen soft, non-tender, normal bowel sounds. On pelvic examination the external genitalia were unremarkable. A speculum exam was performed. There are no mucosal lesions noted in the vaginal vault. Patient has whitish discharge in the upper vaginal vault which is suspicious for yeast infection versus vaginal cream. Patient reports not using estrace cream since Thursday. She does report a recent course of antibiotics for bronchitis which would put her at risk for candidial infection.On bimanual and rectovaginal examination there were no pelvic masses appreciated.  Lab Findings: Lab Results  Component Value Date   WBC 7.1 08/15/2015   HGB 12.3 08/15/2015   HCT 35.8 (L) 08/15/2015   MCV 90.9 08/15/2015   PLT 223 08/15/2015    Radiographic Findings: No results found.  Impression:  The patient is recovering from the effects of radiation.  No evidence of recurrence on clinical exam today.   Plan:  Referral to physical therapy for pelvic floor rehab in light of her stress incontinence. She will be placed on two doses of diflucan for her vaginal candidiasis. She will return for follow up in our clinic in 6 months and will follow up with Dr. Denman George in 3 months.  -----------------------------------  Blair Promise, PhD, MD  This document serves as a record of services personally performed by Gery Pray, MD. It was created on his behalf by Arlyce Harman, a trained medical scribe. The creation of this record is based on the scribe's personal observations and the provider's statements to them. This document has been checked and approved by the attending provider.

## 2017-03-16 NOTE — Progress Notes (Signed)
[name] is here for follow up.  She denies having pain.  She reports having occasional stress incontinence that started after radiation.  She denies having any bowel issues or vaginal bleeding/disharge.  She is not using a dilator but is using estrace cream.  She reports having occasional fatigue and is taking B12.  BP 108/64 (BP Location: Left Arm, Patient Position: Sitting)   Pulse 68   Temp 98.2 F (36.8 C) (Oral)   Ht 5\' 4"  (1.626 m)   Wt 140 lb 3.2 oz (63.6 kg)   SpO2 100%   BMI 24.07 kg/m    Wt Readings from Last 3 Encounters:  03/16/17 140 lb 3.2 oz (63.6 kg)  02/04/17 142 lb (64.4 kg)  12/17/16 138 lb 6.4 oz (62.8 kg)

## 2017-03-17 ENCOUNTER — Telehealth: Payer: Self-pay | Admitting: Radiation Oncology

## 2017-03-17 NOTE — Telephone Encounter (Signed)
Dr. Sondra Come wanted a March f/u appt. Pt said she will call back to schedule.

## 2017-03-20 DIAGNOSIS — J301 Allergic rhinitis due to pollen: Secondary | ICD-10-CM | POA: Diagnosis not present

## 2017-03-20 DIAGNOSIS — J3089 Other allergic rhinitis: Secondary | ICD-10-CM | POA: Diagnosis not present

## 2017-03-23 ENCOUNTER — Encounter: Payer: Self-pay | Admitting: Physical Therapy

## 2017-03-23 ENCOUNTER — Ambulatory Visit: Payer: Federal, State, Local not specified - PPO | Attending: Radiation Oncology | Admitting: Physical Therapy

## 2017-03-23 DIAGNOSIS — M62838 Other muscle spasm: Secondary | ICD-10-CM | POA: Diagnosis not present

## 2017-03-23 DIAGNOSIS — M6281 Muscle weakness (generalized): Secondary | ICD-10-CM | POA: Diagnosis not present

## 2017-03-23 DIAGNOSIS — R278 Other lack of coordination: Secondary | ICD-10-CM | POA: Diagnosis not present

## 2017-03-23 NOTE — Patient Instructions (Addendum)
About Abdominal Massage  Abdominal massage, also called external colon massage, is a self-treatment circular massage technique that can reduce and eliminate gas and ease constipation. The colon naturally contracts in waves in a clockwise direction starting from inside the right hip, moving up toward the ribs, across the belly, and down inside the left hip.  When you perform circular abdominal massage, you help stimulate your colon's normal wave pattern of movement called peristalsis.  It is most beneficial when done after eating.  Positioning You can practice abdominal massage with oil while lying down, or in the shower with soap.  Some people find that it is just as effective to do the massage through clothing while sitting or standing.  How to Massage Start by placing your finger tips or knuckles on your right side, just inside your hip bone.  . Make small circular movements while you move upward toward your rib cage.   . Once you reach the bottom right side of your rib cage, take your circular movements across to the left side of the bottom of your rib cage.  . Next, move downward until you reach the inside of your left hip bone.  This is the path your feces travel in your colon. . Continue to perform your abdominal massage in this pattern for 10 minutes each day.     You can apply as much pressure as is comfortable in your massage.  Start gently and build pressure as you continue to practice.  Notice any areas of pain as you massage; areas of slight pain may be relieved as you massage, but if you have areas of significant or intense pain, consult with your healthcare provider.  Other Considerations . General physical activity including bending and stretching can have a beneficial massage-like effect on the colon.  Deep breathing can also stimulate the colon because breathing deeply activates the same nervous system that supplies the colon.   . Abdominal massage should always be used in  combination with a bowel-conscious diet that is high in the proper type of fiber for you, fluids (primarily water), and a regular exercise program. Toileting Techniques for Bowel Movements (Defecation) Using your belly (abdomen) and pelvic floor muscles to have a bowel movement is usually instinctive.  Sometimes people can have problems with these muscles and have to relearn proper defecation (emptying) techniques.  If you have weakness in your muscles, organs that are falling out, decreased sensation in your pelvis, or ignore your urge to go, you may find yourself straining to have a bowel movement.  You are straining if you are: . holding your breath or taking in a huge gulp of air and holding it  . keeping your lips and jaw tensed and closed tightly . turning red in the face because of excessive pushing or forcing . developing or worsening your  hemorrhoids . getting faint while pushing . not emptying completely and have to defecate many times a day  If you are straining, you are actually making it harder for yourself to have a bowel movement.  Many people find they are pulling up with the pelvic floor muscles and closing off instead of opening the anus. Due to lack pelvic floor relaxation and coordination the abdominal muscles, one has to work harder to push the feces out.  Many people have never been taught how to defecate efficiently and effectively.  Notice what happens to your body when you are having a bowel movement.  While you are sitting on the   toilet pay attention to the following areas: . Jaw and mouth position . Angle of your hips   . Whether your feet touch the ground or not . Arm placement  . Spine position . Waist . Belly tension . Anus (opening of the anal canal)  An Evacuation/Defecation Plan   Here are the 4 basic points:  1. Lean forward enough for your elbows to rest on your knees 2. Support your feet on the floor or use a low stool if your feet don't touch the floor   3. Push out your belly as if you have swallowed a beach ball-you should feel a widening of your waist 4. Open and relax your pelvic floor muscles, rather than tightening around the anus      The following conditions my require modifications to your toileting posture:  . If you have had surgery in the past that limits your back, hip, pelvic, knee or ankle flexibility . Constipation   Your healthcare practitioner may make the following additional suggestions and adjustments:  1) Sit on the toilet  a) Make sure your feet are supported. b) Notice your hip angle and spine position-most people find it effective to lean forward or raise their knees, which can help the muscles around the anus to relax  c) When you lean forward, place your forearms on your thighs for support  2) Relax suggestions a) Breath deeply in through your nose and out slowly through your mouth as if you are smelling the flowers and blowing out the candles. b) To become aware of how to relax your muscles, contracting and releasing muscles can be helpful.  Pull your pelvic floor muscles in tightly by using the image of holding back gas, or closing around the anus (visualize making a circle smaller) and lifting the anus up and in.  Then release the muscles and your anus should drop down and feel open. Repeat 5 times ending with the feeling of relaxation. c) Keep your pelvic floor muscles relaxed; let your belly bulge out. d) The digestive tract starts at the mouth and ends at the anal opening, so be sure to relax both ends of the tube.  Place your tongue on the roof of your mouth with your teeth separated.  This helps relax your mouth and will help to relax the anus at the same time.  3) Empty (defecation) a) Keep your pelvic floor and sphincter relaxed, then bulge your anal muscles.  Make the anal opening wide.  b) Stick your belly out as if you have swallowed a beach ball. c) Make your belly wall hard using your belly  muscles while continuing to breathe. Doing this makes it easier to open your anus. d) Breath out and give a grunt (or try using other sounds such as ahhhh, shhhhh, ohhhh or grrrrrrr).  4) Finish a) As you finish your bowel movement, pull the pelvic floor muscles up and in.  This will leave your anus in the proper place rather than remaining pushed out and down. If you leave your anus pushed out and down, it will start to feel as though that is normal and give you incorrect signals about needing to have a bowel movement. STRETCHING THE PELVIC FLOOR MUSCLES NO DILATOR  Supplies . Vaginal lubricant . Mirror (optional) . Gloves (optional) Positioning . Start in a semi-reclined position with your head propped up. Bend your knees and place your thumb or finger at the vaginal opening. Procedure . Apply a moderate amount of lubricant  on the outer skin of your vagina, the labia minora.  Apply additional lubricant to your finger. Marland Kitchen Spread the skin away from the vaginal opening. Place the end of your finger at the opening. . Do a maximum contraction of the pelvic floor muscles. Tighten the vagina and the anus maximally and relax. . When you know they are relaxed, gently and slowly insert your finger into your vagina, directing your finger slightly downward, for 2-3 inches of insertion. . Relax and stretch the 6 o'clock position . Hold each stretch for _2 min__ and repeat __1_ time with rest breaks of _1__ seconds between each stretch. . Repeat the stretching in the 4 o'clock and 8 o'clock positions. . Total time should be _6__ minutes, _1__ x per day.  Note the amount of theme your were able to achieve and your tolerance to your finger in your vagina. . Once you have accomplished the techniques you may try them in standing with one foot resting on the tub, or in other positions.  This is a good stretch to do in the shower if you don't need to use lubricant.     Earlie Counts, PT [facility] [address] [place], [place] [address]

## 2017-03-23 NOTE — Therapy (Signed)
[facility] [address] [place], [place], [address] Phone: [phone]   Fax:  [phone]  Physical Therapy Evaluation  Patient Details  Name: [name] MRN: [id number] Date of Birth: 12-May-1958 Referring Provider: Dr. Gery Pray  Encounter Date: 03/23/2017      PT End of Session - 03/23/17 1639    Visit Number 1   Date for PT Re-Evaluation 06/15/17   Authorization Type BCBS   Authorization - Visit Number 1   Authorization - Number of Visits 76   PT Start Time 6962   PT Stop Time 1630   PT Time Calculation (min) 60 min   Activity Tolerance Patient tolerated treatment well   Behavior During Therapy [facility] for tasks assessed/performed      Past Medical History:  Diagnosis Date  . ALLERGIC RHINITIS 03/02/2007  . Anemia   . ANXIETY 03/02/2007  . Arthritis   . ASTHMA 11/12/2007   Dr Orvil Feil  . Cancer (HCC)    skin, hx of  . Dysrhythmia    pt. states at night will notice a different heart rhythem  . Endometrial cancer ([place])   . FIBROCYSTIC BREAST DISEASE, HX OF 03/02/2007  . GERD 03/02/2007  . HYPOTHYROIDISM 11/12/2007   Dr Chalmers Cater  . Neuromuscular disorder (HCC)    carpel tunnel syndrome, compression of ulner nerve at elbows  . OSTEOPENIA 11/12/2007  . PEPTIC ULCER DISEASE 03/02/2007  . Pneumonia    hx. of  . Radiation 09/27/15-10/23/15   HDR to vaginal cuff 30 Gy  . TMJ SYNDROME 11/12/2007    Past Surgical History:  Procedure Laterality Date  . 2008 TMJ surgery     . carpel tunnel Right 11/20/2016  . colonoscopy x 2    . cubital tunnel release Right 11/20/2016  . deviated septum surgery - 2007    . DILATION AND CURETTAGE OF UTERUS     2017  . endoscopy - 1990    . Mandib advancement forward  2009  . ROBOTIC ASSISTED TOTAL HYSTERECTOMY WITH BILATERAL SALPINGO OOPHERECTOMY Bilateral 08/14/2015   Procedure: XI ROBOTIC ASSISTED TOTAL HYSTERECTOMY WITH BILATERAL SALPINGO OOPHORECTOMY AND SENTAL LYMPH NODE  BIOPSY;  Surgeon: Everitt Amber, MD;  Location: [facility];  Service: Gynecology;  Laterality: Bilateral;  . 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       Subjective Assessment - 03/23/17 1543    Subjective Patient reports when she has a full bladder and sneezes a tablespoon to leak out.  Barely gets her pants down prior to getting the commode. Patient has to urinate every 2 hours. Patient drinks enough water.    Patient Stated Goals learn some exercises that she can use to reduce her stress incontinence   Currently in Pain? No/denies            [name] PT Assessment - 03/23/17 0001      Assessment   Medical Diagnosis C54.1 Endometrial cancer   Referring Provider Dr. Gery Pray   Onset Date/Surgical Date 07/31/16   Prior Therapy yes for pain with intercourse     Precautions   Precautions Other (comment)   Precaution Comments cancer precautions     Restrictions   Weight Bearing Restrictions No     Balance Screen   Has the patient fallen in the past 6 months No   Has the patient had a decrease in activity level because of a  fear of falling?  No   Is the patient reluctant to leave their home because of a fear of falling?  No     Home Ecologist residence     Prior Function   Level of Independence Independent   Vocation Part time employment   Vocation Requirements physical therapist   Leisure gym 3-4x per week     Cognition   Overall Cognitive Status Within Functional Limits for tasks assessed     Observation/Other Assessments   Focus on Therapeutic Outcomes (FOTO)  40% limitation  27% limitation     Posture/Postural Control   Posture/Postural Control No significant limitations     ROM / Strength   AROM / PROM / Strength AROM;PROM;Strength     AROM   Lumbar Extension decreased by 25%     Strength   Overall Strength Comments difficulty contracting the lower abdominals  and strength is 2/5; bil. HIps is 5/5     Palpation   SI assessment  left ilium is posteriorly rotated;    Palpation comment tenderness located in diaphgram, transverse abdominus and obliques     Transfers   Transfers Not assessed     Ambulation/Gait   Ambulation/Gait No            Objective measurements completed on examination: See above findings.        Pelvic Floor Special Questions - 03/23/17 0001    Currently Sexually Active Yes   Is this Painful Yes   Marinoff Scale discomfort that does not affect completion   Urinary Leakage Yes   Activities that cause leaking Sneezing;With strong urge   Skin Integrity Intact   External Palpation difficulty bulging the perineum   Pelvic Floor Internal Exam patient confirms identification and approves PT to assess pelvic floor muscle integrity   Exam Type Vaginal   Palpation tenderness located in bi. urethra sphincter and bil. obturator internist and levator ani   Strength weak squeeze, no lift   Tone increased tone                  PT Education - 03/23/17 1631    Education provided Yes   Education Details abdominal massage; toileting technique; perineal massage   Person(s) Educated Patient   Methods Explanation;Demonstration;Verbal cues;Handout   Comprehension Verbalized understanding;Returned demonstration          PT Short Term Goals - 03/23/17 1653      PT SHORT TERM GOAL #1   Title independent with initial HEP   Time 4   Period Weeks   Status New   Target Date 04/20/17     PT SHORT TERM GOAL #2   Title understand how to perform abdominal and perineal massage to increase tissue mobility   Time 4   Period Weeks   Status New   Target Date 04/20/17     PT SHORT TERM GOAL #3   Title ability to lift the pelvic floor with contraction due to improve coordination of the muscles   Time 4   Period Weeks   Status New   Target Date 04/20/17     PT SHORT TERM GOAL #4   Title understand how to  correctly toilet due to ability to relax the pelvic floor.    Time 4   Period Weeks   Status New   Target Date 04/20/17           PT Long Term Goals - 03/23/17 1656  PT LONG TERM GOAL #1   Title independent with HEP and understand how to progress herself   Time 12   Period Weeks   Status New   Target Date 06/15/17     PT LONG TERM GOAL #2   Title ability to bulge her pelvic floor due to improved coordination of the diaphgram and pelvic floor muscles   Time 12   Period Weeks   Status New   Target Date 06/15/17     PT LONG TERM GOAL #3   Title urinary leakage when she has a full bladder and sneezes decreased >/= 75% due to increased in pelvic floor strength   Time 12   Period Weeks   Status New   Target Date 06/15/17     PT LONG TERM GOAL #4   Title urinary leakage when she has the urge going to the bathroom decreased by 75% due to improved strength of the pelvic floor    Time 12   Period Weeks   Status New   Target Date 06/15/17                Plan - 03/23/17 1644    Clinical Impression Statement Patient is a 61 year old female with diagnosis of stress incontinence for the past 8 months.  Patient reports no pain except with intercourse. Patient will leak urine when sneezing with a full bladder and with the urge to urinate.  Pelvic floor is 2/5 with no lift.  Patient is not able to bulge the pelvic floor. Patient has tightness in the pelvic floor muscles and are tender.  Patient has tenderness in the abdominal muscles and diaphgram.  Patient is not able to expand her lower rib cage.  Left ilium is rotated posteriorly. Patient has difficulty with bowel movements at times.  Patient s/p endometrial cancer with total hysterectomy on 08/13/2016 and radiation from 09/27/2015 to 10/22/2016. Patient will benefit from skilled therapy to improve tissue mobility, improve pelvic floor coordination with increased abdominal pressure and reduce urinary leakage.    History and  Personal Factors relevant to plan of care: Osteopenia; Robotic assisted total hysterectomy with bilateral salpingo-oopherectomy 08/14/2015; radiation 09/27/2015 - 10/23/2015   Clinical Presentation Stable   Clinical Presentation due to: stable condtion   Clinical Decision Making Low   Rehab Potential Excellent   Clinical Impairments Affecting Rehab Potential Osteopenia; Robotic assisted total hysterectomy with bilateral salpingo-oopherectomy 08/14/2015; radiation 09/27/2015 - 10/23/2015   PT Frequency 1x / week   PT Duration 12 weeks   PT Treatment/Interventions Biofeedback;Therapeutic activities;Therapeutic exercise;Neuromuscular re-education;Patient/family education;Scar mobilization;Dry needling;Manual techniques   PT Next Visit Plan abdominal soft tissue work; pelvic floor EMG with contraction and bulging; hip stretches; diaphragmatic breathing; rib cage mobility   PT Home Exercise Plan progress as needed   Consulted and Agree with Plan of Care Patient      Patient will benefit from skilled therapeutic intervention in order to improve the following deficits and impairments:  Decreased strength, Decreased mobility, Decreased activity tolerance, Decreased endurance, Increased fascial restricitons, Increased muscle spasms  Visit Diagnosis: Muscle weakness (generalized) - Plan: PT plan of care cert/re-cert  Other lack of coordination - Plan: PT plan of care cert/re-cert  Other muscle spasm - Plan: PT plan of care cert/re-cert     Problem List Patient Active Problem List   Diagnosis Date Noted  . Upper respiratory infection 02/04/2017  . Metatarsalgia of left foot 10/16/2016  . Morton's neuroma of left foot 04/01/2016  . Loss of  transverse plantar arch of left foot 04/01/2016  . Toe pain, left 03/04/2016  . Arthralgia 03/04/2016  . Genital atrophy of female 02/18/2016  . Endometrial cancer ([place]) 08/14/2015  . Endometrial adenocarcinoma ([place]) 08/06/2015  . Anemia 12/01/2014  . Plantar  fasciitis 01/20/2011  . Hamstring tendonitis at origin 01/20/2011  . Sacroiliac dysfunction 12/10/2010  . Well adult exam 11/17/2010  . Paresthesia 11/17/2010  . Hamstring tendonitis at origin 10/28/2010  . Plantar fascia syndrome 10/07/2010  . HYPOTHYROIDISM 11/12/2007  . Asthma 11/12/2007  . TMJ SYNDROME 11/12/2007  . OSTEOPENIA 11/12/2007  . Anxiety state 03/02/2007  . ALLERGIC RHINITIS 03/02/2007  . GERD 03/02/2007  . PEPTIC ULCER DISEASE 03/02/2007  . SKIN CANCER, HX OF 03/02/2007  . FIBROCYSTIC BREAST DISEASE, HX OF 03/02/2007    Earlie Counts, PT 03/23/17 5:01 PM   [facility] [address], [place], [place], [address] Phone: [phone]   Fax:  [phone]  Name: MUNTAHA VERMETTE MRN: [id number] Date of Birth: 21-Jan-1956

## 2017-03-24 ENCOUNTER — Ambulatory Visit: Payer: Federal, State, Local not specified - PPO | Admitting: Physical Therapy

## 2017-03-25 DIAGNOSIS — J301 Allergic rhinitis due to pollen: Secondary | ICD-10-CM | POA: Diagnosis not present

## 2017-03-25 DIAGNOSIS — J3089 Other allergic rhinitis: Secondary | ICD-10-CM | POA: Diagnosis not present

## 2017-03-30 ENCOUNTER — Telehealth: Payer: Self-pay | Admitting: Oncology

## 2017-03-30 NOTE — Telephone Encounter (Signed)
Called patient back and asked if she is still using estrace cream.  She said that she is and has noticed the discharge 36 hours after using it.  Advised her that Dr. Sondra Come thinks this is what is causing the discharge but that she can contact her Gyn doctor if she wants.

## 2017-03-30 NOTE — Telephone Encounter (Signed)
Patient called and said she took fluconazole as prescribed by Dr. Sondra Come for a yeast infection.  She said she is continuing to have a small amount of white discharge and thinks the yeast infection is not gone.  Advised her that Dr. Sondra Come will be advised and we will call her back.

## 2017-03-31 ENCOUNTER — Ambulatory Visit: Payer: Federal, State, Local not specified - PPO | Attending: Radiation Oncology | Admitting: Physical Therapy

## 2017-03-31 ENCOUNTER — Encounter: Payer: Self-pay | Admitting: Physical Therapy

## 2017-03-31 DIAGNOSIS — R278 Other lack of coordination: Secondary | ICD-10-CM | POA: Diagnosis not present

## 2017-03-31 DIAGNOSIS — M62838 Other muscle spasm: Secondary | ICD-10-CM | POA: Insufficient documentation

## 2017-03-31 DIAGNOSIS — J301 Allergic rhinitis due to pollen: Secondary | ICD-10-CM | POA: Diagnosis not present

## 2017-03-31 DIAGNOSIS — J3089 Other allergic rhinitis: Secondary | ICD-10-CM | POA: Diagnosis not present

## 2017-03-31 DIAGNOSIS — M6281 Muscle weakness (generalized): Secondary | ICD-10-CM | POA: Diagnosis not present

## 2017-03-31 NOTE — Patient Instructions (Addendum)
Slow Contraction: Gravity Eliminated (Hook-Lying)    Lie with hips and knees bent. Slowly squeeze pelvic floor and lower abdominals/ medially/superiorly  for _5__ seconds. Rest for __5_ seconds. Repeat _5__ times. Do _2__ times a day.   Copyright  VHI. All rights reserved.  Slow Contraction: Gravity Resisted (Sitting)    Sitting, slowly squeeze pelvic  With lower abdominals/medially/superiorly floor for ___ seconds. Rest for _5__ seconds. Repeat _5__ times. Do _2__ times a day.  Copyright  VHI. All rights reserved.  Slow Contraction: Gravity Resisted (Standing)    Standing, slowly squeeze pelvic floor and lower abdominals/medially superiorly for _5__ seconds. Rest for _5__ seconds. Repeat __5_ times. Do _2__ times a day.  Copyright  VHI. All rights reserved.  [place] [address] [place], [place] [address] Phone # [phone] Fax [phone]

## 2017-03-31 NOTE — Therapy (Signed)
[facility] [address] [place], [place], [address] Phone: [phone]   Fax:  [phone]  Physical Therapy Treatment  Patient Details  Name: [name] MRN: [id number] Date of Birth: 24-Jul-1955 Referring Provider: Dr. Gery Pray  Encounter Date: 03/31/2017      PT End of Session - 03/31/17 1644    Visit Number 2   Date for PT Re-Evaluation 06/15/17   Authorization Type BCBS   Authorization - Visit Number 2   Authorization - Number of Visits 75   PT Start Time 9702   PT Stop Time 1624   PT Time Calculation (min) 54 min   Activity Tolerance Patient tolerated treatment well   Behavior During Therapy [facility] for tasks assessed/performed      Past Medical History:  Diagnosis Date  . ALLERGIC RHINITIS 03/02/2007  . Anemia   . ANXIETY 03/02/2007  . Arthritis   . ASTHMA 11/12/2007   Dr Orvil Feil  . Cancer (HCC)    skin, hx of  . Dysrhythmia    pt. states at night will notice a different heart rhythem  . Endometrial cancer ([place])   . FIBROCYSTIC BREAST DISEASE, HX OF 03/02/2007  . GERD 03/02/2007  . HYPOTHYROIDISM 11/12/2007   Dr Chalmers Cater  . Neuromuscular disorder (HCC)    carpel tunnel syndrome, compression of ulner nerve at elbows  . OSTEOPENIA 11/12/2007  . PEPTIC ULCER DISEASE 03/02/2007  . Pneumonia    hx. of  . Radiation 09/27/15-10/23/15   HDR to vaginal cuff 30 Gy  . TMJ SYNDROME 11/12/2007    Past Surgical History:  Procedure Laterality Date  . 2008 TMJ surgery     . carpel tunnel Right 11/20/2016  . colonoscopy x 2    . cubital tunnel release Right 11/20/2016  . deviated septum surgery - 2007    . DILATION AND CURETTAGE OF UTERUS     2017  . endoscopy - 1990    . Mandib advancement forward  2009  . ROBOTIC ASSISTED TOTAL HYSTERECTOMY WITH BILATERAL SALPINGO OOPHERECTOMY Bilateral 08/14/2015   Procedure: XI ROBOTIC ASSISTED TOTAL HYSTERECTOMY WITH BILATERAL SALPINGO OOPHORECTOMY AND SENTAL LYMPH NODE  BIOPSY;  Surgeon: Everitt Amber, MD;  Location: [facility];  Service: Gynecology;  Laterality: Bilateral;  . 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      Subjective Assessment - 03/31/17 1535    Subjective I have been using a stool for a bowel movement and has helped. The bladder leakage does not happen all of the time.  I have been doing the abodominal massage and vaginal exercise.    Patient Stated Goals learn some exercises that she can use to reduce her stress incontinence   Currently in Pain? Yes   Pain Score 3    Pain Location Abdomen   Pain Orientation Lower   Pain Descriptors / Indicators Discomfort   Pain Type Acute pain   Pain Onset More than a month ago   Pain Frequency Intermittent   Aggravating Factors  nothing   Pain Relieving Factors not sure   Multiple Pain Sites No                         [facility] Adult PT Treatment/Exercise - 03/31/17 0001      Neuro Re-ed    Neuro Re-ed Details  contraction of the lower abdominals with pelvic floor  contraction and not over engaging the upper abdominals; reviewed her toileting technique to incorporate the lower abdominal contraction     Manual Therapy   Manual Therapy Myofascial release;Soft tissue mobilization   Soft tissue mobilization abdominal muscles; diaphgram; transverse abdominus;    Myofascial Release over the hysterectomy scars to release the fascia in different layers, lifting up of the abodminal tissue to release around the intestines, stomach, lower abdominal area                PT Education - 03/31/17 1632    Education provided Yes   Education Details pelvic floor contraction with lower abdominal contraction   Person(s) Educated Patient   Methods Explanation;Demonstration;Verbal cues;Handout   Comprehension Returned demonstration;Verbalized understanding          PT Short Term Goals - 03/31/17 1643      PT SHORT TERM GOAL  #1   Title independent with initial HEP   Time 4   Period Weeks   Status Achieved     PT SHORT TERM GOAL #2   Title understand how to perform abdominal and perineal massage to increase tissue mobility   Time 4   Period Weeks   Status On-going     PT SHORT TERM GOAL #3   Title ability to lift the pelvic floor with contraction due to improve coordination of the muscles   Time 4   Period Weeks   Status On-going     PT SHORT TERM GOAL #4   Title understand how to correctly toilet due to ability to relax the pelvic floor.    Time 4   Period Weeks   Status On-going           PT Long Term Goals - 03/23/17 1656      PT LONG TERM GOAL #1   Title independent with HEP and understand how to progress herself   Time 12   Period Weeks   Status New   Target Date 06/15/17     PT LONG TERM GOAL #2   Title ability to bulge her pelvic floor due to improved coordination of the diaphgram and pelvic floor muscles   Time 12   Period Weeks   Status New   Target Date 06/15/17     PT LONG TERM GOAL #3   Title urinary leakage when she has a full bladder and sneezes decreased >/= 75% due to increased in pelvic floor strength   Time 12   Period Weeks   Status New   Target Date 06/15/17     PT LONG TERM GOAL #4   Title urinary leakage when she has the urge going to the bathroom decreased by 75% due to improved strength of the pelvic floor    Time 12   Period Weeks   Status New   Target Date 06/15/17               Plan - 03/31/17 1534    Clinical Impression Statement Patient is learning her abdominal massage and keeping the abdominals soft after the therapy. Patient was able to contract the lower abdominals correctly for the first time.  Patient has been able to get to the commode in time in the past week.  Patient has learned correct pelvic floor contraction. Patient will benefit from skilled therapy to improve tissue mobility, improve pelvic floor coordination with increased  abdominal pressure and reduce urinary leakage.    Rehab Potential Excellent   Clinical Impairments Affecting Rehab Potential Osteopenia;  Robotic assisted total hysterectomy with bilateral salpingo-oopherectomy 08/14/2015; radiation 09/27/2015 - 10/23/2015   PT Frequency 1x / week   PT Duration 12 weeks   PT Treatment/Interventions Biofeedback;Therapeutic activities;Therapeutic exercise;Neuromuscular re-education;Patient/family education;Scar mobilization;Dry needling;Manual techniques   PT Next Visit Plan abdominal soft tissue work; pelvic floor EMG with contraction and bulging; hip stretches;    PT Home Exercise Plan progress as needed   Recommended Other Services MD signed initial note   Consulted and Agree with Plan of Care Patient      Patient will benefit from skilled therapeutic intervention in order to improve the following deficits and impairments:  Decreased strength, Decreased mobility, Decreased activity tolerance, Decreased endurance, Increased fascial restricitons, Increased muscle spasms  Visit Diagnosis: Muscle weakness (generalized)  Other lack of coordination  Other muscle spasm     Problem List Patient Active Problem List   Diagnosis Date Noted  . Upper respiratory infection 02/04/2017  . Metatarsalgia of left foot 10/16/2016  . Morton's neuroma of left foot 04/01/2016  . Loss of transverse plantar arch of left foot 04/01/2016  . Toe pain, left 03/04/2016  . Arthralgia 03/04/2016  . Genital atrophy of female 02/18/2016  . Endometrial cancer ([place]) 08/14/2015  . Endometrial adenocarcinoma ([place]) 08/06/2015  . Anemia 12/01/2014  . Plantar fasciitis 01/20/2011  . Hamstring tendonitis at origin 01/20/2011  . Sacroiliac dysfunction 12/10/2010  . Well adult exam 11/17/2010  . Paresthesia 11/17/2010  . Hamstring tendonitis at origin 10/28/2010  . Plantar fascia syndrome 10/07/2010  . HYPOTHYROIDISM 11/12/2007  . Asthma 11/12/2007  . TMJ SYNDROME 11/12/2007  .  OSTEOPENIA 11/12/2007  . Anxiety state 03/02/2007  . ALLERGIC RHINITIS 03/02/2007  . GERD 03/02/2007  . PEPTIC ULCER DISEASE 03/02/2007  . SKIN CANCER, HX OF 03/02/2007  . FIBROCYSTIC BREAST DISEASE, HX OF 03/02/2007    Earlie Counts, PT 03/31/17 4:46 PM   [facility] [address] [place], [place], [address] Phone: [phone]   Fax:  [phone]  Name: TEEGAN GUINTHER MRN: [id number] Date of Birth: August 10, 1955

## 2017-04-07 ENCOUNTER — Encounter: Payer: Self-pay | Admitting: Physical Therapy

## 2017-04-07 ENCOUNTER — Ambulatory Visit: Payer: Federal, State, Local not specified - PPO | Admitting: Physical Therapy

## 2017-04-07 DIAGNOSIS — R278 Other lack of coordination: Secondary | ICD-10-CM | POA: Diagnosis not present

## 2017-04-07 DIAGNOSIS — M6281 Muscle weakness (generalized): Secondary | ICD-10-CM

## 2017-04-07 DIAGNOSIS — M62838 Other muscle spasm: Secondary | ICD-10-CM | POA: Diagnosis not present

## 2017-04-07 DIAGNOSIS — J301 Allergic rhinitis due to pollen: Secondary | ICD-10-CM | POA: Diagnosis not present

## 2017-04-07 DIAGNOSIS — J3089 Other allergic rhinitis: Secondary | ICD-10-CM | POA: Diagnosis not present

## 2017-04-07 NOTE — Patient Instructions (Addendum)
Squat, Modified: [employer] on edge of couch. Slide off edge into a deep squat, allowing couch to support back. Relax in this position for _15__ seconds. When need to do a bowel movement. Try to let the pelvic floor giveway.  Copyright  VHI. All rights reserved.  Slow Contraction: Gravity Resisted (Sitting)    Sitting, slowly squeeze pelvic floor for _5__ seconds. Rest for _5__ seconds. Repeat __5_ times. Do __5_ times a day.  Copyright  VHI. All rights reserved.    Elevator (Sitting)    Sitting, imagine pelvic floor with 2 levels. "Going up", contract pelvic floor and Hold for __2_ seconds on each level. Then "going down", contract pelvic floor and Hold for __2_ seconds on each level. Relax. Repeat _5__ times. Do _1__ times a day.  Copyright  VHI. All rights reserved.  Quick Contraction: Gravity Resisted (Sitting)    Sitting, quickly squeeze then fully relax pelvic floor. Perform _1__ sets of _5__. Rest for _1__ seconds between sets. Do _5__ times a day.  Copyright  VHI. All rights reserved.  [facility] [address] [place], [place] [address] Phone # [phone] Fax [phone]

## 2017-04-07 NOTE — Therapy (Signed)
[facility] [address], [place], [place], [address] Phone: [phone]   Fax:  [phone]  Physical Therapy Treatment  Patient Details  Name: [name] MRN: [id number] Date of Birth: 08-26-1955 Referring Provider: Dr. Gery Pray  Encounter Date: 04/07/2017      PT End of Session - 04/07/17 1528    Visit Number 3   Date for PT Re-Evaluation 06/15/17   Authorization Type BC[employer]   Authorization - Visit Number 3   Authorization - Number of Visits 65   PT Start Time 3825   PT Stop Time 1525   PT Time Calculation (min) 40 min   Activity Tolerance Patient tolerated treatment well   Behavior During Therapy [facility] for tasks assessed/performed      Past Medical History:  Diagnosis Date  . ALLERGIC RHINITIS 03/02/2007  . Anemia   . ANXIETY 03/02/2007  . Arthritis   . ASTHMA 11/12/2007   Dr Orvil Feil  . Cancer (HCC)    skin, hx of  . Dysrhythmia    pt. states at night will notice a different heart rhythem  . Endometrial cancer ([place])   . FIBROCYSTIC BREAST DISEASE, HX OF 03/02/2007  . GERD 03/02/2007  . HYPOTHYROIDISM 11/12/2007   Dr Chalmers Cater  . Neuromuscular disorder (HCC)    carpel tunnel syndrome, compression of ulner nerve at elbows  . OSTEOPENIA 11/12/2007  . PEPTIC ULCER DISEASE 03/02/2007  . Pneumonia    hx. of  . Radiation 09/27/15-10/23/15   HDR to vaginal cuff 30 Gy  . TMJ SYNDROME 11/12/2007    Past Surgical History:  Procedure Laterality Date  . 2008 TMJ surgery     . carpel tunnel Right 11/20/2016  . colonoscopy x 2    . cubital tunnel release Right 11/20/2016  . deviated septum surgery - 2007    . DILATION AND CURETTAGE OF UTERUS     2017  . endoscopy - 1990    . Mandib advancement forward  2009  . ROBOTIC ASSISTED TOTAL HYSTERECTOMY WITH BILATERAL SALPINGO OOPHERECTOMY Bilateral 08/14/2015   Procedure: XI ROBOTIC ASSISTED TOTAL HYSTERECTOMY WITH BILATERAL SALPINGO OOPHORECTOMY AND SENTAL LYMPH NODE  BIOPSY;  Surgeon: Everitt Amber, MD;  Location: [facility];  Service: Gynecology;  Laterality: Bilateral;  . 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      Subjective Assessment - 04/07/17 1448    Subjective I have been doing my pelvic floor exercises.  My abdomen is not as tight. I feel like there is long standing adhesions especially on right upper quadrant. I have not had leakage in the last week. I am able to make it to the bathroom but I do not have the confidence that I will make it to the bathroom.    Patient Stated Goals learn some exercises that she can use to reduce her stress incontinence   Currently in Pain? Yes   Pain Score 2    Pain Location Abdomen   Pain Orientation Lower   Pain Descriptors / Indicators Discomfort   Pain Type Acute pain   Pain Onset More than a month ago   Pain Frequency Intermittent   Aggravating Factors  fullness from not having a bowel movement   Pain Relieving Factors bowel movement   Multiple Pain Sites No  Pelvic Floor Special Questions - 04/07/17 0001    Biofeedback resting tone supine 2 uv; breathing to bulge pelvic floor keeping below 6 uv in supine and sitting; quick flicks in sitting; elevator in sitting   Biofeedback sensor type Vaginal  surface   Biofeedback Activity Bulging;Quick contraction;10 second hold                   PT Education - 04/07/17 1526    Education provided Yes   Education Details pelvic floor contraction with control   Person(s) Educated Patient   Methods Explanation;Demonstration;Verbal cues;Handout   Comprehension Verbalized understanding          PT Short Term Goals - 04/07/17 1532      PT SHORT TERM GOAL #2   Title understand how to perform abdominal and perineal massage to increase tissue mobility   Time 4   Period Weeks   Status Achieved     PT SHORT TERM GOAL #3   Title ability to lift the  pelvic floor with contraction due to improve coordination of the muscles   Time 4   Period Weeks   Status Achieved     PT SHORT TERM GOAL #4   Title understand how to correctly toilet due to ability to relax the pelvic floor.    Time 4   Period Weeks   Status Achieved           PT Long Term Goals - 03/23/17 1656      PT LONG TERM GOAL #1   Title independent with HEP and understand how to progress herself   Time 12   Period Weeks   Status New   Target Date 06/15/17     PT LONG TERM GOAL #2   Title ability to bulge her pelvic floor due to improved coordination of the diaphgram and pelvic floor muscles   Time 12   Period Weeks   Status New   Target Date 06/15/17     PT LONG TERM GOAL #3   Title urinary leakage when she has a full bladder and sneezes decreased >/= 75% due to increased in pelvic floor strength   Time 12   Period Weeks   Status New   Target Date 06/15/17     PT LONG TERM GOAL #4   Title urinary leakage when she has the urge going to the bathroom decreased by 75% due to improved strength of the pelvic floor    Time 12   Period Weeks   Status New   Target Date 06/15/17               Plan - 04/07/17 1529    Clinical Impression Statement Patient resting tone for pelvic floor is 2 uv. Patient was able to relax the pelvic floor with a bulge in sitting and supine after awhile using pelvic floor EMG.  Patient has difficulty with contracting the pelvic floor above 6 uv in sitting for 5 seconds.  Patient has not had urinary leakage since last visit. Patient will benefit from skilled therapy to improve tissue mobility, improve pelvic floor coordination with increased abdominal pressure and reduce urinary leakage.    Rehab Potential Excellent   Clinical Impairments Affecting Rehab Potential Osteopenia; Robotic assisted total hysterectomy with bilateral salpingo-oopherectomy 08/14/2015; radiation 09/27/2015 - 10/23/2015   PT Frequency 1x / week   PT Duration 12  weeks   PT Treatment/Interventions Biofeedback;Therapeutic activities;Therapeutic exercise;Neuromuscular re-education;Patient/family education;Scar mobilization;Dry needling;Manual techniques   PT Next Visit Plan  abdominal soft tissue work; pelvic floor EMG with contraction and bulging; hip stretches;    PT Home Exercise Plan progress as needed   Consulted and Agree with Plan of Care Patient      Patient will benefit from skilled therapeutic intervention in order to improve the following deficits and impairments:  Decreased strength, Decreased mobility, Decreased activity tolerance, Decreased endurance, Increased fascial restricitons, Increased muscle spasms  Visit Diagnosis: Muscle weakness (generalized)  Other lack of coordination  Other muscle spasm     Problem List Patient Active Problem List   Diagnosis Date Noted  . Upper respiratory infection 02/04/2017  . Metatarsalgia of left foot 10/16/2016  . Morton's neuroma of left foot 04/01/2016  . Loss of transverse plantar arch of left foot 04/01/2016  . Toe pain, left 03/04/2016  . Arthralgia 03/04/2016  . Genital atrophy of female 02/18/2016  . Endometrial cancer ([place]) 08/14/2015  . Endometrial adenocarcinoma ([place]) 08/06/2015  . Anemia 12/01/2014  . Plantar fasciitis 01/20/2011  . Hamstring tendonitis at origin 01/20/2011  . Sacroiliac dysfunction 12/10/2010  . Well adult exam 11/17/2010  . Paresthesia 11/17/2010  . Hamstring tendonitis at origin 10/28/2010  . Plantar fascia syndrome 10/07/2010  . HYPOTHYROIDISM 11/12/2007  . Asthma 11/12/2007  . TMJ SYNDROME 11/12/2007  . OSTEOPENIA 11/12/2007  . Anxiety state 03/02/2007  . ALLERGIC RHINITIS 03/02/2007  . GERD 03/02/2007  . PEPTIC ULCER DISEASE 03/02/2007  . SKIN CANCER, HX OF 03/02/2007  . FIBROCYSTIC BREAST DISEASE, HX OF 03/02/2007    Earlie Counts, PT 04/07/17 3:33 PM   [facility] [address]  [place], [place], [address] Phone: [phone]   Fax:  [phone]  Name: LEDONNA DORMER MRN: [id number] Date of Birth: 1955/07/31

## 2017-04-14 ENCOUNTER — Encounter: Payer: Federal, State, Local not specified - PPO | Admitting: Physical Therapy

## 2017-04-15 DIAGNOSIS — J301 Allergic rhinitis due to pollen: Secondary | ICD-10-CM | POA: Diagnosis not present

## 2017-04-15 DIAGNOSIS — J3089 Other allergic rhinitis: Secondary | ICD-10-CM | POA: Diagnosis not present

## 2017-04-21 ENCOUNTER — Ambulatory Visit: Payer: Federal, State, Local not specified - PPO | Admitting: Physical Therapy

## 2017-04-21 ENCOUNTER — Encounter: Payer: Self-pay | Admitting: Physical Therapy

## 2017-04-21 DIAGNOSIS — M6281 Muscle weakness (generalized): Secondary | ICD-10-CM

## 2017-04-21 DIAGNOSIS — R278 Other lack of coordination: Secondary | ICD-10-CM

## 2017-04-21 DIAGNOSIS — M62838 Other muscle spasm: Secondary | ICD-10-CM | POA: Diagnosis not present

## 2017-04-21 DIAGNOSIS — J301 Allergic rhinitis due to pollen: Secondary | ICD-10-CM | POA: Diagnosis not present

## 2017-04-21 DIAGNOSIS — J3089 Other allergic rhinitis: Secondary | ICD-10-CM | POA: Diagnosis not present

## 2017-04-21 NOTE — Patient Instructions (Addendum)
Hip Adductor: Wall Stretch    Lie on back with hips against wall, back of thighs on wall. Pull legs apart until stretch is felt in inner thighs. Hold _60__ seconds. Relax. Repeat _2__ times. Do _1__ times a day. Advanced: At end of stretch, rotate thighs outward.  Copyright  VHI. All rights reserved.  Posterior Hip: Wall Slide    Lie on floor with back of legs on wall. Put right ankle on other thigh. Slide opposite foot down wall until stretch is felt in back of hip. Hold _60__ seconds. Relax. Repeat _1__ times. Do __1_ times a day. Repeat on other leg.    Copyright  VHI. All rights reserved.  Slow Contraction: Gravity Resisted (Sitting)     Sitting, slowly squeeze pelvic floor for __8_ seconds. Rest for _5__ seconds. Repeat _10__ times. Do __3_ times a day. Then contract 5 quick flicks.  Copyright  VHI. All rights reserved.   Slow Contraction: Gravity Resisted (Standing)    Standing, slowly squeeze pelvic floor for __3_ seconds. Rest for __3_ seconds. Repeat _5__ times. Do _2__ times a day. Then end with quick flicks.   Copyright  VHI. All rights reserved.  Elevator (Sitting)    Sitting, imagine pelvic floor with 2 levels. "Going up", contract pelvic floor and Hold for __2_ seconds on each level. Then "going down", contract pelvic floor and Hold for __2_ seconds on each level. Relax. Repeat _5__ times. Do _1__ times a day.   Headspace  Breath relax  [facility] [address], [place], [place] [address] Phone # [phone] Fax [phone]

## 2017-04-21 NOTE — Therapy (Signed)
[facility] [address] [place], [place], [address] Phone: [phone]   Fax:  [phone]  Physical Therapy Treatment  Patient Details  Name: [name] MRN: [id number] Date of Birth: 1956/03/28 Referring Provider: Dr. Gery Pray  Encounter Date: 04/21/2017      PT End of Session - 04/21/17 1526    Visit Number 4   Date for PT Re-Evaluation 06/15/17   Authorization Type BCBS   Authorization - Visit Number 4   Authorization - Number of Visits 75   PT Start Time 1017   PT Stop Time 1523   PT Time Calculation (min) 38 min   Activity Tolerance Patient tolerated treatment well   Behavior During Therapy [facility] for tasks assessed/performed      Past Medical History:  Diagnosis Date  . ALLERGIC RHINITIS 03/02/2007  . Anemia   . ANXIETY 03/02/2007  . Arthritis   . ASTHMA 11/12/2007   Dr Orvil Feil  . Cancer (HCC)    skin, hx of  . Dysrhythmia    pt. states at night will notice a different heart rhythem  . Endometrial cancer ([place])   . FIBROCYSTIC BREAST DISEASE, HX OF 03/02/2007  . GERD 03/02/2007  . HYPOTHYROIDISM 11/12/2007   Dr Chalmers Cater  . Neuromuscular disorder (HCC)    carpel tunnel syndrome, compression of ulner nerve at elbows  . OSTEOPENIA 11/12/2007  . PEPTIC ULCER DISEASE 03/02/2007  . Pneumonia    hx. of  . Radiation 09/27/15-10/23/15   HDR to vaginal cuff 30 Gy  . TMJ SYNDROME 11/12/2007    Past Surgical History:  Procedure Laterality Date  . 2008 TMJ surgery     . carpel tunnel Right 11/20/2016  . colonoscopy x 2    . cubital tunnel release Right 11/20/2016  . deviated septum surgery - 2007    . DILATION AND CURETTAGE OF UTERUS     2017  . endoscopy - 1990    . Mandib advancement forward  2009  . ROBOTIC ASSISTED TOTAL HYSTERECTOMY WITH BILATERAL SALPINGO OOPHERECTOMY Bilateral 08/14/2015   Procedure: XI ROBOTIC ASSISTED TOTAL HYSTERECTOMY WITH BILATERAL SALPINGO OOPHORECTOMY AND SENTAL LYMPH NODE  BIOPSY;  Surgeon: Everitt Amber, MD;  Location: [facility];  Service: Gynecology;  Laterality: Bilateral;  . 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      Subjective Assessment - 04/21/17 1447    Subjective I am able to contract the pelvic floor better.  I have not had leakage or sneezed without a full bladder.  Patient reports difficulty with a bowel movement with feet up but has to bear down when feet are down. When patient sits on a stationary bike  and go into standing then I will have a pain that goes into my rectum.    Patient Stated Goals learn some exercises that she can use to reduce her stress incontinence   Currently in Pain? No/denies                         [facility] Adult PT Treatment/Exercise - 04/21/17 0001      Self-Care   Self-Care Other Self-Care Comments   Other Self-Care Comments  using the commode to have a bowel movement in sitting or squatting prior to bowel movement and letting the pelvic floor go     Therapeutic Activites    Therapeutic Activities Other Therapeutic Activities  Other Therapeutic Activities sitting on a recumbent bike with towel roll      Neuro Re-ed    Neuro Re-ed Details  supine on mat with feet on wall for squat position to release the pelvic floor for preparation of a bowel movement, using deep breaths                PT Education - 04/21/17 1522    Education provided Yes   Education Details legs on wall relaxing the pelvic floor; hip stretches with legs on the wall; apps to use on the commode to relax the pelvic floor, Pelvic floor contraction in sitting and standing   Person(s) Educated Patient   Methods Explanation;Demonstration;Verbal cues;Handout   Comprehension Returned demonstration;Verbalized understanding          PT Short Term Goals - 04/07/17 1532      PT SHORT TERM GOAL #2   Title understand how to perform abdominal and perineal massage to  increase tissue mobility   Time 4   Period Weeks   Status Achieved     PT SHORT TERM GOAL #3   Title ability to lift the pelvic floor with contraction due to improve coordination of the muscles   Time 4   Period Weeks   Status Achieved     PT SHORT TERM GOAL #4   Title understand how to correctly toilet due to ability to relax the pelvic floor.    Time 4   Period Weeks   Status Achieved           PT Long Term Goals - 04/21/17 1530      PT LONG TERM GOAL #1   Title independent with HEP and understand how to progress herself   Baseline still learning   Time 12   Period Weeks   Status On-going     PT LONG TERM GOAL #2   Title ability to bulge her pelvic floor due to improved coordination of the diaphgram and pelvic floor muscles   Baseline able to do it 25% of the time   Time 12   Period Weeks   Status On-going     PT LONG TERM GOAL #3   Title urinary leakage when she has a full bladder and sneezes decreased >/= 75% due to increased in pelvic floor strength   Baseline has not sneezed yet   Time 12   Period Weeks   Status On-going     PT LONG TERM GOAL #4   Title urinary leakage when she has the urge going to the bathroom decreased by 75% due to improved strength of the pelvic floor    Time 12   Period Weeks   Status Achieved               Plan - 04/21/17 1527    Clinical Impression Statement Patient is having difficulty with relaxing her pelvic floor when sitting on a commode with her feet elevated.  Patient was instructed on ways to relax her pelvic floor and how to sit on the commode.  Patient has not leaked urine since last visit.  Patient has increased tone of her abdomen.  Patient will benefit from skilled therapy to improve pelvic floor coordination with reduction of urinary leakage.    Rehab Potential Excellent   Clinical Impairments Affecting Rehab Potential Osteopenia; Robotic assisted total hysterectomy with bilateral salpingo-oopherectomy  08/14/2015; radiation 09/27/2015 - 10/23/2015   PT Frequency 1x / week   PT Duration 12 weeks  PT Treatment/Interventions Biofeedback;Therapeutic activities;Therapeutic exercise;Neuromuscular re-education;Patient/family education;Scar mobilization;Dry needling;Manual techniques   PT Next Visit Plan see patient in 4 weeks to progress pelvic floor contration  in standing   PT Home Exercise Plan progress as needed   Consulted and Agree with Plan of Care Patient      Patient will benefit from skilled therapeutic intervention in order to improve the following deficits and impairments:  Decreased strength, Decreased mobility, Decreased activity tolerance, Decreased endurance, Increased fascial restricitons, Increased muscle spasms  Visit Diagnosis: Muscle weakness (generalized)  Other lack of coordination  Other muscle spasm     Problem List Patient Active Problem List   Diagnosis Date Noted  . Upper respiratory infection 02/04/2017  . Metatarsalgia of left foot 10/16/2016  . Morton's neuroma of left foot 04/01/2016  . Loss of transverse plantar arch of left foot 04/01/2016  . Toe pain, left 03/04/2016  . Arthralgia 03/04/2016  . Genital atrophy of female 02/18/2016  . Endometrial cancer ([place]) 08/14/2015  . Endometrial adenocarcinoma ([place]) 08/06/2015  . Anemia 12/01/2014  . Plantar fasciitis 01/20/2011  . Hamstring tendonitis at origin 01/20/2011  . Sacroiliac dysfunction 12/10/2010  . Well adult exam 11/17/2010  . Paresthesia 11/17/2010  . Hamstring tendonitis at origin 10/28/2010  . Plantar fascia syndrome 10/07/2010  . HYPOTHYROIDISM 11/12/2007  . Asthma 11/12/2007  . TMJ SYNDROME 11/12/2007  . OSTEOPENIA 11/12/2007  . Anxiety state 03/02/2007  . ALLERGIC RHINITIS 03/02/2007  . GERD 03/02/2007  . PEPTIC ULCER DISEASE 03/02/2007  . SKIN CANCER, HX OF 03/02/2007  . FIBROCYSTIC BREAST DISEASE, HX OF 03/02/2007    Earlie Counts, PT 04/21/17 3:31 PM   [facility] [address], [place], [place], [address] Phone: [phone]   Fax:  [phone]  Name: DEMETRIC PARSLOW MRN: [id number] Date of Birth: September 07, 1955

## 2017-04-28 ENCOUNTER — Encounter: Payer: Federal, State, Local not specified - PPO | Admitting: Physical Therapy

## 2017-04-28 DIAGNOSIS — J301 Allergic rhinitis due to pollen: Secondary | ICD-10-CM | POA: Diagnosis not present

## 2017-04-28 DIAGNOSIS — J3089 Other allergic rhinitis: Secondary | ICD-10-CM | POA: Diagnosis not present

## 2017-04-30 ENCOUNTER — Encounter: Payer: Federal, State, Local not specified - PPO | Admitting: Internal Medicine

## 2017-05-05 ENCOUNTER — Encounter: Payer: Federal, State, Local not specified - PPO | Admitting: Physical Therapy

## 2017-05-06 DIAGNOSIS — J301 Allergic rhinitis due to pollen: Secondary | ICD-10-CM | POA: Diagnosis not present

## 2017-05-06 DIAGNOSIS — J3089 Other allergic rhinitis: Secondary | ICD-10-CM | POA: Diagnosis not present

## 2017-05-08 ENCOUNTER — Encounter: Payer: Self-pay | Admitting: Internal Medicine

## 2017-05-08 ENCOUNTER — Ambulatory Visit (INDEPENDENT_AMBULATORY_CARE_PROVIDER_SITE_OTHER): Payer: Federal, State, Local not specified - PPO | Admitting: Internal Medicine

## 2017-05-08 DIAGNOSIS — Z0000 Encounter for general adult medical examination without abnormal findings: Secondary | ICD-10-CM | POA: Diagnosis not present

## 2017-05-08 DIAGNOSIS — M2550 Pain in unspecified joint: Secondary | ICD-10-CM

## 2017-05-08 DIAGNOSIS — C541 Malignant neoplasm of endometrium: Secondary | ICD-10-CM | POA: Diagnosis not present

## 2017-05-08 DIAGNOSIS — L570 Actinic keratosis: Secondary | ICD-10-CM | POA: Diagnosis not present

## 2017-05-08 DIAGNOSIS — L565 Disseminated superficial actinic porokeratosis (DSAP): Secondary | ICD-10-CM | POA: Diagnosis not present

## 2017-05-08 DIAGNOSIS — Z85828 Personal history of other malignant neoplasm of skin: Secondary | ICD-10-CM | POA: Diagnosis not present

## 2017-05-08 DIAGNOSIS — L821 Other seborrheic keratosis: Secondary | ICD-10-CM | POA: Diagnosis not present

## 2017-05-08 DIAGNOSIS — M810 Age-related osteoporosis without current pathological fracture: Secondary | ICD-10-CM

## 2017-05-08 MED ORDER — CYCLOBENZAPRINE HCL 5 MG PO TABS: 5.0000 mg | ORAL_TABLET | Freq: Three times a day (TID) | ORAL | 1 refills | Status: DC | PRN

## 2017-05-08 MED ORDER — DICLOFENAC SODIUM 1 % TD GEL: 1.0000 "application " | Freq: Four times a day (QID) | TRANSDERMAL | 3 refills | Status: DC

## 2017-05-08 NOTE — Assessment & Plan Note (Signed)
We discussed age appropriate health related issues, including available/recomended screening tests and vaccinations. We discussed a need for adhering to healthy diet and exercise. Labs were ordered to be later reviewed . All questions were answered.   

## 2017-05-08 NOTE — Assessment & Plan Note (Signed)
Reclast IV

## 2017-05-08 NOTE — Assessment & Plan Note (Addendum)
Discussed Labs 

## 2017-05-08 NOTE — Progress Notes (Signed)
Subjective:  Patient ID: [name], female    DOB: May 17, 1956  Age: 61 y.o. MRN: [id number]  CC: No chief complaint on file.   HPI [name] presents for a well exam  Outpatient Medications Prior to Visit  Medication Sig Dispense Refill  . Calcium Citrate-Vitamin D (CALCIUM CITRATE +D PO) Take 2 tablets by mouth daily.     . cetirizine (ZYRTEC) 5 MG tablet Take 5 mg by mouth 2 (two) times daily as needed for allergies.    . cholecalciferol (VITAMIN D) 1000 units tablet Take 2,000 Units by mouth daily.    Marland Kitchen EPIPEN 2-PAK 0.3 MG/0.3ML DEVI Inject 0.3 mg into the muscle once. Reported on 09/03/2015    . ESTRACE VAGINAL 0.1 MG/GM vaginal cream Place 1 Applicatorful vaginally 3 (three) times a week. 42.5 g 5  . famotidine (PEPCID) 20 MG tablet Take 1 tablet (20 mg total) by mouth daily as needed for heartburn or indigestion. Take with Meloxicam 30 tablet 2  . fluticasone (FLONASE) 50 MCG/ACT nasal spray Place 1 spray into the nose daily as needed for allergies. Reported on 08/06/2015    . levothyroxine (SYNTHROID, LEVOTHROID) 25 MCG tablet Take 25-50 mcg by mouth daily. She takes one tablet daily 4 days per week (Monday-Thursday) and two tablets daily 3 days per week (Friday-Sunday).    Marland Kitchen liothyronine (CYTOMEL) 5 MCG tablet Take 5-10 mcg by mouth 2 (two) times daily. She takes one tablet in the morning and two tablets midday.    Marland Kitchen MELATONIN-CHAMOMILE PO Take 1 capsule by mouth at bedtime as needed and may repeat dose one time if needed (For sleep.).     Marland Kitchen METRONIDAZOLE, TOPICAL, 0.75 % LOTN Apply 1 application topically at bedtime.    . minoxidil (ROGAINE) 2 % external solution Apply 1 application topically 4 (four) times a week.     . Multiple Vitamin (MULTIVITAMIN WITH MINERALS) TABS tablet Take 1 tablet by mouth daily.    . Omega-3 Fatty Acids (FISH OIL) 1000 MG CAPS Take 1 capsule by mouth daily.    Marland Kitchen pyridoxine (B-6) 200 MG tablet Take 200 mg by mouth daily.    . TURMERIC PO Take 1  capsule by mouth daily.    Marland Kitchen venlafaxine XR (EFFEXOR-XR) 37.5 MG 24 hr capsule TAKE 1 CAPSULE (37.5 MG TOTAL) BY MOUTH DAILY WITH BREAKFAST. 30 capsule 6  . vitamin B-12 (CYANOCOBALAMIN) 1000 MCG tablet Take 1,000 mcg by mouth daily.    . vitamin C (ASCORBIC ACID) 500 MG tablet Take 500 mg by mouth daily.    . vitamin E 400 UNIT capsule Take 400 Units by mouth daily.    . fluconazole (DIFLUCAN) 150 MG tablet Take 1 tablet (150 mg total) by mouth daily. Takes second tablet 72 hours later 2 tablet 0  . polyethylene glycol (MIRALAX / GLYCOLAX) packet Take 17 g by mouth daily. As needed     Facility-Administered Medications Prior to Visit  Medication Dose Route Frequency Provider Last Rate Last Dose  . estradiol (ESTRACE) vaginal cream 1 Applicatorful  1 Applicatorful Vaginal Once per day on Mon Wed Fri Everitt Amber, MD        ROS Review of Systems  Constitutional: Negative for activity change, appetite change, chills, fatigue and unexpected weight change.  HENT: Negative for congestion, mouth sores and sinus pressure.   Eyes: Negative for visual disturbance.  Respiratory: Negative for cough and chest tightness.   Gastrointestinal: Negative for abdominal pain and nausea.  Genitourinary: Negative  for difficulty urinating, frequency and vaginal pain.  Musculoskeletal: Negative for back pain and gait problem.  Skin: Negative for pallor and rash.  Neurological: Negative for dizziness, tremors, weakness, numbness and headaches.  Psychiatric/Behavioral: Negative for confusion and sleep disturbance.    Objective:  BP 108/72 (BP Location: Left Arm, Patient Position: Sitting, Cuff Size: Normal)   Pulse 63   Temp 97.9 F (36.6 C) (Oral)   Ht 5\' 4"  (1.626 m)   Wt 141 lb (64 kg)   SpO2 99%   BMI 24.20 kg/m   BP Readings from Last 3 Encounters:  05/08/17 108/72  03/16/17 108/64  02/04/17 122/80    Wt Readings from Last 3 Encounters:  05/08/17 141 lb (64 kg)  03/16/17 140 lb 3.2 oz (63.6  kg)  02/04/17 142 lb (64.4 kg)    Physical Exam  Constitutional: She appears well-developed. No distress.  HENT:  Head: Normocephalic.  Right Ear: External ear normal.  Left Ear: External ear normal.  Nose: Nose normal.  Mouth/Throat: Oropharynx is clear and moist.  Eyes: Conjunctivae are normal. Pupils are equal, round, and reactive to light. Right eye exhibits no discharge. Left eye exhibits no discharge.  Neck: Normal range of motion. Neck supple. No JVD present. No tracheal deviation present. No thyromegaly present.  Cardiovascular: Normal rate, regular rhythm and normal heart sounds.  Pulmonary/Chest: No stridor. No respiratory distress. She has no wheezes.  Abdominal: Soft. Bowel sounds are normal. She exhibits no distension and no mass. There is no tenderness. There is no rebound and no guarding.  Musculoskeletal: She exhibits no edema or tenderness.  Lymphadenopathy:    She has no cervical adenopathy.  Neurological: She displays normal reflexes. No cranial nerve deficit. She exhibits normal muscle tone. Coordination normal.  Skin: No rash noted. No erythema.  Psychiatric: She has a normal mood and affect. Her behavior is normal. Judgment and thought content normal.    Lab Results  Component Value Date   WBC 7.1 08/15/2015   HGB 12.3 08/15/2015   HCT 35.8 (L) 08/15/2015   PLT 223 08/15/2015   GLUCOSE 95 03/04/2016   CHOL 207 (H) 12/01/2014   TRIG 120.0 12/01/2014   HDL 76.00 12/01/2014   LDLDIRECT 102.8 11/11/2010   LDLCALC 107 (H) 12/01/2014   ALT 17 03/04/2016   AST 19 03/04/2016   NA 139 03/04/2016   K 4.3 03/04/2016   CL 104 03/04/2016   CREATININE 0.83 03/04/2016   BUN 19 03/04/2016   CO2 30 03/04/2016   TSH 0.99 11/11/2010    No results found.  Assessment & Plan:   There are no diagnoses linked to this encounter. I have discontinued Franki Cabot. Ladson's polyethylene glycol and fluconazole. I am also having her maintain her liothyronine, levothyroxine,  EPIPEN 2-PAK, minoxidil, fluticasone, Calcium Citrate-Vitamin D (CALCIUM CITRATE +D PO), MELATONIN-CHAMOMILE PO, pyridoxine, TURMERIC PO, cholecalciferol, multivitamin with minerals, METRONIDAZOLE (TOPICAL), vitamin C, cetirizine, Fish Oil, famotidine, vitamin E, ESTRACE VAGINAL, venlafaxine XR, and vitamin B-12. We will continue to administer estradiol.  No orders of the defined types were placed in this encounter.    Follow-up: No Follow-up on file.  Walker Kehr, MD

## 2017-05-13 ENCOUNTER — Other Ambulatory Visit (INDEPENDENT_AMBULATORY_CARE_PROVIDER_SITE_OTHER): Payer: Federal, State, Local not specified - PPO

## 2017-05-13 DIAGNOSIS — M2550 Pain in unspecified joint: Secondary | ICD-10-CM

## 2017-05-13 DIAGNOSIS — Z0000 Encounter for general adult medical examination without abnormal findings: Secondary | ICD-10-CM | POA: Diagnosis not present

## 2017-05-13 DIAGNOSIS — J3089 Other allergic rhinitis: Secondary | ICD-10-CM | POA: Diagnosis not present

## 2017-05-13 DIAGNOSIS — J301 Allergic rhinitis due to pollen: Secondary | ICD-10-CM | POA: Diagnosis not present

## 2017-05-13 LAB — LIPID PANEL
Cholesterol: 221 mg/dL — ABNORMAL HIGH (ref 0–200)
HDL: 79.5 mg/dL (ref >39.00)
LDL Cholesterol: 118 mg/dL — ABNORMAL HIGH (ref 0–99)
NonHDL: 141.37
Total CHOL/HDL Ratio: 3
Triglycerides: 119 mg/dL (ref 0.0–149.0)
VLDL: 23.8 mg/dL (ref 0.0–40.0)

## 2017-05-13 LAB — HEPATIC FUNCTION PANEL
ALT: 15 U/L (ref 0–35)
AST: 20 U/L (ref 0–37)
Albumin: 4 g/dL (ref 3.5–5.2)
Alkaline Phosphatase: 47 U/L (ref 39–117)
Bilirubin, Direct: 0.2 mg/dL (ref 0.0–0.3)
Total Bilirubin: 1 mg/dL (ref 0.2–1.2)
Total Protein: 6.4 g/dL (ref 6.0–8.3)

## 2017-05-13 LAB — TSH: TSH: 1.47 u[IU]/mL (ref 0.35–4.50)

## 2017-05-13 LAB — CBC WITH DIFFERENTIAL/PLATELET
Basophils Absolute: 0 10*3/uL (ref 0.0–0.1)
Basophils Relative: 0.7 % (ref 0.0–3.0)
Eosinophils Absolute: 0.1 10*3/uL (ref 0.0–0.7)
Eosinophils Relative: 3.7 % (ref 0.0–5.0)
HCT: 40.1 % (ref 36.0–46.0)
Hemoglobin: 13.8 g/dL (ref 12.0–15.0)
Lymphocytes Relative: 36.6 % (ref 12.0–46.0)
Lymphs Abs: 1.4 10*3/uL (ref 0.7–4.0)
MCHC: 34.4 g/dL (ref 30.0–36.0)
MCV: 93.4 fl (ref 78.0–100.0)
Monocytes Absolute: 0.3 10*3/uL (ref 0.1–1.0)
Monocytes Relative: 8.5 % (ref 3.0–12.0)
Neutro Abs: 1.9 10*3/uL (ref 1.4–7.7)
Neutrophils Relative %: 50.5 % (ref 43.0–77.0)
Platelets: 210 10*3/uL (ref 150.0–400.0)
RBC: 4.29 Mil/uL (ref 3.87–5.11)
RDW: 13.5 % (ref 11.5–15.5)
WBC: 3.8 10*3/uL — ABNORMAL LOW (ref 4.0–10.5)

## 2017-05-13 LAB — URINALYSIS
Bilirubin Urine: NEGATIVE
Hgb urine dipstick: NEGATIVE
Ketones, ur: NEGATIVE
Leukocytes, UA: NEGATIVE
Nitrite: NEGATIVE
Specific Gravity, Urine: 1.01 (ref 1.000–1.030)
Total Protein, Urine: NEGATIVE
Urine Glucose: NEGATIVE
Urobilinogen, UA: 0.2 (ref 0.0–1.0)
pH: 7 (ref 5.0–8.0)

## 2017-05-13 LAB — BASIC METABOLIC PANEL
BUN: 16 mg/dL (ref 6–23)
CO2: 30 mEq/L (ref 19–32)
Calcium: 9 mg/dL (ref 8.4–10.5)
Chloride: 99 mEq/L (ref 96–112)
Creatinine, Ser: 0.73 mg/dL (ref 0.40–1.20)
GFR: 86.3 mL/min (ref >60.00)
Glucose, Bld: 90 mg/dL (ref 70–99)
Potassium: 3.7 mEq/L (ref 3.5–5.1)
Sodium: 136 mEq/L (ref 135–145)

## 2017-05-13 LAB — SEDIMENTATION RATE: Sed Rate: 4 mm/hr (ref 0–30)

## 2017-05-13 LAB — VITAMIN B12: Vitamin B-12: 636 pg/mL (ref 211–911)

## 2017-05-15 DIAGNOSIS — M461 Sacroiliitis, not elsewhere classified: Secondary | ICD-10-CM | POA: Diagnosis not present

## 2017-05-15 DIAGNOSIS — R102 Pelvic and perineal pain: Secondary | ICD-10-CM | POA: Diagnosis not present

## 2017-05-26 ENCOUNTER — Encounter: Payer: Self-pay | Admitting: Physical Therapy

## 2017-05-26 ENCOUNTER — Ambulatory Visit: Payer: Federal, State, Local not specified - PPO | Attending: Radiation Oncology | Admitting: Physical Therapy

## 2017-05-26 DIAGNOSIS — J301 Allergic rhinitis due to pollen: Secondary | ICD-10-CM | POA: Diagnosis not present

## 2017-05-26 DIAGNOSIS — M62838 Other muscle spasm: Secondary | ICD-10-CM | POA: Diagnosis not present

## 2017-05-26 DIAGNOSIS — R278 Other lack of coordination: Secondary | ICD-10-CM | POA: Diagnosis not present

## 2017-05-26 DIAGNOSIS — M6281 Muscle weakness (generalized): Secondary | ICD-10-CM | POA: Diagnosis not present

## 2017-05-26 DIAGNOSIS — J3089 Other allergic rhinitis: Secondary | ICD-10-CM | POA: Diagnosis not present

## 2017-05-26 NOTE — Therapy (Signed)
[facility] [address] [place], [place], [address] Phone: [phone]   Fax:  [phone]  Physical Therapy Treatment  Patient Details  Name: [name] MRN: [id number] Date of Birth: 04-30-1956 Referring Provider: Dr. Gery Pray   Encounter Date: 05/26/2017  PT End of Session - 05/26/17 0936    Visit Number  5    Date for PT Re-Evaluation  06/15/17    Authorization Type  [employer]    Authorization - Visit Number  5    Authorization - Number of Visits  75    PT Start Time  0932    PT Stop Time  1013    PT Time Calculation (min)  41 min    Activity Tolerance  Patient tolerated treatment well    Behavior During Therapy  [facility] for tasks assessed/performed       Past Medical History:  Diagnosis Date  . ALLERGIC RHINITIS 03/02/2007  . Anemia   . ANXIETY 03/02/2007  . Arthritis   . ASTHMA 11/12/2007   Dr Orvil Feil  . Cancer (HCC)    skin, hx of  . Dysrhythmia    pt. states at night will notice a different heart rhythem  . Endometrial cancer ([place])   . FIBROCYSTIC BREAST DISEASE, HX OF 03/02/2007  . GERD 03/02/2007  . HYPOTHYROIDISM 11/12/2007   Dr Chalmers Cater  . Neuromuscular disorder (HCC)    carpel tunnel syndrome, compression of ulner nerve at elbows  . OSTEOPENIA 11/12/2007  . PEPTIC ULCER DISEASE 03/02/2007  . Pneumonia    hx. of  . Radiation 09/27/15-10/23/15   HDR to vaginal cuff 30 Gy  . TMJ SYNDROME 11/12/2007    Past Surgical History:  Procedure Laterality Date  . 2008 TMJ surgery     . carpel tunnel Right 11/20/2016  . colonoscopy x 2    . cubital tunnel release Right 11/20/2016  . deviated septum surgery - 2007    . DILATION AND CURETTAGE OF UTERUS     2017  . endoscopy - 1990    . Mandib advancement forward  2009  . ROBOTIC ASSISTED TOTAL HYSTERECTOMY WITH BILATERAL SALPINGO OOPHERECTOMY Bilateral 08/14/2015   Procedure: XI ROBOTIC ASSISTED TOTAL HYSTERECTOMY WITH BILATERAL SALPINGO OOPHORECTOMY AND  SENTAL LYMPH NODE BIOPSY;  Surgeon: Everitt Amber, MD;  Location: [facility];  Service: Gynecology;  Laterality: Bilateral;  . 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  Subjective Assessment - 05/26/17 0936    Subjective  I feel like I have more strength and power in the muscles.  I have not had episodes of straining the bowels. I feel more bowel sounds.  I am going 1 time per day. I had a few sneezies with no leakage. No leakage since last visit.     Patient Stated Goals  learn some exercises that she can use to reduce her stress incontinence    Currently in Pain?  No/denies         [facility] PT Assessment - 05/26/17 0001      Assessment   Medical Diagnosis  C54.1 Endometrial cancer    Onset Date/Surgical Date  07/31/16    Prior Therapy  yes for pain with intercourse      Precautions   Precautions  Other (comment)    Precaution Comments  cancer precautions      Restrictions   Weight Bearing Restrictions  No  [place] residence      Prior Function   Level of Independence  Independent    Vocation  Part time employment    Vocation Requirements  physical therapist    Leisure  gym 3-4x per week      Cognition   Overall Cognitive Status  Within Functional Limits for tasks assessed      Posture/Postural Control   Posture/Postural Control  No significant limitations      AROM   Lumbar Extension  decreased by 25%                  [facility] Adult PT Treatment/Exercise - 05/26/17 0001      Modalities   Modalities  Moist Heat      Moist Heat Therapy   Number Minutes Moist Heat  15 Minutes    Moist Heat Location  Lumbar Spine prone      Manual Therapy   Manual Therapy  Soft tissue mobilization    Soft tissue mobilization  left quadratus, left psoas, left lumbar paraspinals       Trigger Point Dry Needling - 05/26/17 1004    Consent Given?  Yes    Education Handout  Provided  Yes    Muscles Treated Upper Body  Iliopsoas;Quadratus Lumborum left, trigger point response with muscle relaxation on all    Muscles Treated Lower Body  -- left L1-L5 multifidi           PT Education - 05/26/17 1003    Education provided  Yes    Education Details  information on dry needling; reviewed HEP and patient is verbally independent    Person(s) Educated  Patient    Methods  Explanation;Demonstration;Handout    Comprehension  Verbalized understanding;Returned demonstration       PT Short Term Goals - 04/07/17 1532      PT SHORT TERM GOAL #2   Title  understand how to perform abdominal and perineal massage to increase tissue mobility    Time  4    Period  Weeks    Status  Achieved      PT SHORT TERM GOAL #3   Title  ability to lift the pelvic floor with contraction due to improve coordination of the muscles    Time  4    Period  Weeks    Status  Achieved      PT SHORT TERM GOAL #4   Title  understand how to correctly toilet due to ability to relax the pelvic floor.     Time  4    Period  Weeks    Status  Achieved        PT Long Term Goals - 05/26/17 3710      PT LONG TERM GOAL #1   Title  independent with HEP and understand how to progress herself    Time  12    Period  Weeks    Status  Achieved      PT LONG TERM GOAL #2   Title  ability to bulge her pelvic floor due to improved coordination of the diaphgram and pelvic floor muscles    Time  12    Period  Weeks    Status  Achieved      PT LONG TERM GOAL #3   Title  urinary leakage when she has a full bladder and sneezes decreased >/= 75% due to increased in pelvic floor strength    Time  12    Period  Weeks    Status  Achieved      PT LONG TERM GOAL #4   Title  urinary leakage when she has the urge going to the bathroom decreased by 75% due to improved strength of the pelvic floor     Time  12    Period  Weeks    Status  Achieved            Plan - 05/26/17 1007    Clinical  Impression Statement  Patient has met all of her goals. Patient has not had urinary leakage since last visit.  Patient is now able to have 1 bowel movement per day.  Patient had some pain in left quadratus.  The muscle was relaxed after therapy.      Rehab Potential  Excellent    Clinical Impairments Affecting Rehab Potential  Osteopenia; Robotic assisted total hysterectomy with bilateral salpingo-oopherectomy 08/14/2015; radiation 09/27/2015 - 10/23/2015    PT Frequency  --    PT Duration  --    PT Treatment/Interventions  Biofeedback;Therapeutic activities;Therapeutic exercise;Neuromuscular re-education;Patient/family education;Scar mobilization;Dry needling;Manual techniques    PT Next Visit Plan  Disharge today to [place] and Agree with Plan of Care  Patient       Patient will benefit from skilled therapeutic intervention in order to improve the following deficits and impairments:  Decreased strength, Decreased mobility, Decreased activity tolerance, Decreased endurance, Increased fascial restricitons, Increased muscle spasms  Visit Diagnosis: Muscle weakness (generalized)  Other lack of coordination  Other muscle spasm     Problem List Patient Active Problem List   Diagnosis Date Noted  . Upper respiratory infection 02/04/2017  . Metatarsalgia of left foot 10/16/2016  . Morton's neuroma of left foot 04/01/2016  . Loss of transverse plantar arch of left foot 04/01/2016  . Toe pain, left 03/04/2016  . Arthralgia 03/04/2016  . Genital atrophy of female 02/18/2016  . Endometrial adenocarcinoma ([place]) 08/06/2015  . Anemia 12/01/2014  . Plantar fasciitis 01/20/2011  . Hamstring tendonitis at origin 01/20/2011  . Sacroiliac dysfunction 12/10/2010  . Well adult exam 11/17/2010  . Paresthesia 11/17/2010  . Hamstring tendonitis at origin 10/28/2010  . Plantar fascia syndrome 10/07/2010  . HYPOTHYROIDISM 11/12/2007  . Asthma 11/12/2007  . TMJ  SYNDROME 11/12/2007  . Osteoporosis 11/12/2007  . Anxiety state 03/02/2007  . ALLERGIC RHINITIS 03/02/2007  . GERD 03/02/2007  . PEPTIC ULCER DISEASE 03/02/2007  . SKIN CANCER, HX OF 03/02/2007  . FIBROCYSTIC BREAST DISEASE, HX OF 03/02/2007    Earlie Counts, PT 05/26/17 10:57 AM   [facility] [address], [place], [place], [address] Phone: [phone]   Fax:  [phone]  Name: [name] MRN: [id number] Date of Birth: 01-Jun-1956  PHYSICAL THERAPY DISCHARGE SUMMARY  Visits from Start of Care: 5  Current functional level related to goals / functional outcomes: See above.  Patient has met all of goals. Patient has no urinary leakage.    Remaining deficits: See above.   Education / Equipment: HEP Plan: Patient agrees to discharge.  Patient goals were met. Patient is being discharged due to meeting the stated rehab goals.  Thank you for the referral. Earlie Counts, PT 05/26/17 10:58 AM  ?????

## 2017-05-26 NOTE — Patient Instructions (Signed)

## 2017-05-29 DIAGNOSIS — N760 Acute vaginitis: Secondary | ICD-10-CM | POA: Diagnosis not present

## 2017-06-02 DIAGNOSIS — J301 Allergic rhinitis due to pollen: Secondary | ICD-10-CM | POA: Diagnosis not present

## 2017-06-02 DIAGNOSIS — J3089 Other allergic rhinitis: Secondary | ICD-10-CM | POA: Diagnosis not present

## 2017-06-05 DIAGNOSIS — M461 Sacroiliitis, not elsewhere classified: Secondary | ICD-10-CM | POA: Diagnosis not present

## 2017-06-05 DIAGNOSIS — R102 Pelvic and perineal pain: Secondary | ICD-10-CM | POA: Diagnosis not present

## 2017-06-11 ENCOUNTER — Telehealth: Payer: Self-pay

## 2017-06-11 NOTE — Telephone Encounter (Signed)
Left message asking patient to call back to schedule nurse visit to get first shingrix vaccine---please let Su Hilt, RN Shelba Flake) know when appt is made so that vaccines can be labeled---any questions can talk with tamara

## 2017-06-12 DIAGNOSIS — J3089 Other allergic rhinitis: Secondary | ICD-10-CM | POA: Diagnosis not present

## 2017-06-12 DIAGNOSIS — J301 Allergic rhinitis due to pollen: Secondary | ICD-10-CM | POA: Diagnosis not present

## 2017-06-16 DIAGNOSIS — J301 Allergic rhinitis due to pollen: Secondary | ICD-10-CM | POA: Diagnosis not present

## 2017-06-16 DIAGNOSIS — J3089 Other allergic rhinitis: Secondary | ICD-10-CM | POA: Diagnosis not present

## 2017-06-17 ENCOUNTER — Encounter: Payer: Self-pay | Admitting: Gynecologic Oncology

## 2017-06-17 ENCOUNTER — Ambulatory Visit: Payer: Federal, State, Local not specified - PPO | Attending: Gynecologic Oncology | Admitting: Gynecologic Oncology

## 2017-06-17 VITALS — BP 121/57 | HR 72 | Temp 98.5°F | Resp 18 | Ht 64.0 in | Wt 140.1 lb

## 2017-06-17 DIAGNOSIS — N952 Postmenopausal atrophic vaginitis: Secondary | ICD-10-CM | POA: Diagnosis not present

## 2017-06-17 DIAGNOSIS — Z8711 Personal history of peptic ulcer disease: Secondary | ICD-10-CM | POA: Diagnosis not present

## 2017-06-17 DIAGNOSIS — F419 Anxiety disorder, unspecified: Secondary | ICD-10-CM | POA: Diagnosis not present

## 2017-06-17 DIAGNOSIS — Z8542 Personal history of malignant neoplasm of other parts of uterus: Secondary | ICD-10-CM | POA: Diagnosis not present

## 2017-06-17 DIAGNOSIS — Z8249 Family history of ischemic heart disease and other diseases of the circulatory system: Secondary | ICD-10-CM | POA: Diagnosis not present

## 2017-06-17 DIAGNOSIS — Z880 Allergy status to penicillin: Secondary | ICD-10-CM | POA: Diagnosis not present

## 2017-06-17 DIAGNOSIS — N951 Menopausal and female climacteric states: Secondary | ICD-10-CM | POA: Diagnosis not present

## 2017-06-17 DIAGNOSIS — Z885 Allergy status to narcotic agent status: Secondary | ICD-10-CM | POA: Diagnosis not present

## 2017-06-17 DIAGNOSIS — Z923 Personal history of irradiation: Secondary | ICD-10-CM

## 2017-06-17 DIAGNOSIS — K219 Gastro-esophageal reflux disease without esophagitis: Secondary | ICD-10-CM | POA: Insufficient documentation

## 2017-06-17 DIAGNOSIS — Z90710 Acquired absence of both cervix and uterus: Secondary | ICD-10-CM | POA: Diagnosis not present

## 2017-06-17 DIAGNOSIS — Z90722 Acquired absence of ovaries, bilateral: Secondary | ICD-10-CM

## 2017-06-17 DIAGNOSIS — C541 Malignant neoplasm of endometrium: Secondary | ICD-10-CM

## 2017-06-17 DIAGNOSIS — J45909 Unspecified asthma, uncomplicated: Secondary | ICD-10-CM | POA: Insufficient documentation

## 2017-06-17 DIAGNOSIS — Z79899 Other long term (current) drug therapy: Secondary | ICD-10-CM | POA: Diagnosis not present

## 2017-06-17 DIAGNOSIS — Z9889 Other specified postprocedural states: Secondary | ICD-10-CM | POA: Insufficient documentation

## 2017-06-17 DIAGNOSIS — E039 Hypothyroidism, unspecified: Secondary | ICD-10-CM | POA: Insufficient documentation

## 2017-06-17 DIAGNOSIS — Z91013 Allergy to seafood: Secondary | ICD-10-CM | POA: Diagnosis not present

## 2017-06-17 NOTE — Progress Notes (Signed)
FOLLOW-UP ENDOMETRIAL CANCER  Assessment and Plan:    61 y.o. year old with Stage IA Grade 2 endometrioid endometrial cancer.   S/p robotic total hysterectomy, BSO, sentinel lymph node biopsy on 08/14/15. Positive LVSI, 12% myometrial invasion, questionably positive pelvic washings (atypical cells) and negative lymph nodes. Adjuvant vaginal radiation completed in April, 2017.   1/ history of endometrial cancer: No sign of recurrence Discussed signs and symptoms of recurrence including vaginal bleeding or discharge, leg pain or swelling and changes in bowel or bladder habits. She was given the opportunity to ask questions, which were answered to her satisfaction, and she is agreement with the above mentioned plan of care. She will see Dr Sondra Come in 3 months and return to see me in June 2019. She will see Dr Helane Rima in January, 2019.  2/ Genital atrophy of menopause: continue vaginal estrace three times weekly.   3/ Hot flashes: continue effexor.   HPI:  [name] is a 61 y.o. year old G0 initially seen in consultation in February 2016 referred by Dr Helane Rima for grade 2 endometrial cancer. Her only risk factors for this are the exogenous compounded hormones that she took for menopause and her history of nulliparity.  She then underwent a robotic total hysterectomy on 2/59/56 without complications.  Her postoperative course was uncomplicated.  Her final pathologic diagnosis is a Stage IA Grade 2 endometrioid endometrial cancer with positive lymphovascular space invasion, 2/18 mm (12%) of myometrial invasion and negative lymph nodes. Washings showed atypical cells but these were inconclusive for being malignant.  Due to high/intermediate risk factors she received vaginal brachytherapy (30 Gy in 5 fractions) between 09/27/15 to 10/23/15.  Interval Hx: She is seen today for a routine surveillance visit. She has improved deep dyspareunia and sensation of shortened vagina.   Past Medical History:   Diagnosis Date  . ALLERGIC RHINITIS 03/02/2007  . Anemia   . ANXIETY 03/02/2007  . Arthritis   . ASTHMA 11/12/2007   Dr Orvil Feil  . Cancer (HCC)    skin, hx of  . Dysrhythmia    pt. states at night will notice a different heart rhythem  . Endometrial cancer ([place])   . FIBROCYSTIC BREAST DISEASE, HX OF 03/02/2007  . GERD 03/02/2007  . HYPOTHYROIDISM 11/12/2007   Dr Chalmers Cater  . Neuromuscular disorder (HCC)    carpel tunnel syndrome, compression of ulner nerve at elbows  . OSTEOPENIA 11/12/2007  . PEPTIC ULCER DISEASE 03/02/2007  . Pneumonia    hx. of  . Radiation 09/27/15-10/23/15   HDR to vaginal cuff 30 Gy  . TMJ SYNDROME 11/12/2007   Past Surgical History:  Procedure Laterality Date  . 2008 TMJ surgery     . carpel tunnel Right 11/20/2016  . colonoscopy x 2    . cubital tunnel release Right 11/20/2016  . deviated septum surgery - 2007    . DILATION AND CURETTAGE OF UTERUS     2017  . endoscopy - 1990    . Mandib advancement forward  2009  . ROBOTIC ASSISTED TOTAL HYSTERECTOMY WITH BILATERAL SALPINGO OOPHERECTOMY Bilateral 08/14/2015   Procedure: XI ROBOTIC ASSISTED TOTAL HYSTERECTOMY WITH BILATERAL SALPINGO OOPHORECTOMY AND SENTAL LYMPH NODE BIOPSY;  Surgeon: Everitt Amber, MD;  Location: [facility];  Service: Gynecology;  Laterality: Bilateral;  . 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 Hypertension Father   . Heart disease Father 85  chf  . Heart disease Mother 27       CAD  . Hypertension Other   . Asthma Neg Hx    Social History   Socioeconomic History  . Marital status: Married    Spouse name: Not on file  . Number of children: Not on file  . Years of education: Not on file  . Highest education level: Not on file  Social Needs  . Financial resource strain: Not on file  . Food insecurity - worry: Not on file  . Food insecurity - inability: Not on file  . Transportation needs - medical: Not on file  .  Transportation needs - non-medical: Not on file  Occupational History  . Occupation: Community education officer at [employer] Use  . Smoking status: Never Smoker  . Smokeless tobacco: Never Used  . Tobacco comment: Married, 1 child  Substance and Sexual Activity  . Alcohol use: Yes    Alcohol/week: 1.2 oz    Types: 1 Glasses of wine, 1 Cans of beer per week    Comment: daily  . Drug use: No  . Sexual activity: Yes  Other Topics Concern  . Not on file  Social History Narrative  . Not on file   Current Outpatient Medications on File Prior to Visit  Medication Sig Dispense Refill  . Calcium Citrate-Vitamin D (CALCIUM CITRATE +D PO) Take 2 tablets by mouth daily.     . cetirizine (ZYRTEC) 5 MG tablet Take 5 mg by mouth 2 (two) times daily as needed for allergies.    . cholecalciferol (VITAMIN D) 1000 units tablet Take 2,000 Units by mouth daily.    . cyclobenzaprine (FLEXERIL) 5 MG tablet Take 1 tablet (5 mg total) 3 (three) times daily as needed by mouth for muscle spasms. 30 tablet 1  . diclofenac sodium (VOLTAREN) 1 % GEL Apply 1 application 4 (four) times daily topically. 100 g 3  . EPIPEN 2-PAK 0.3 MG/0.3ML DEVI Inject 0.3 mg into the muscle once. Reported on 09/03/2015    . ESTRACE VAGINAL 0.1 MG/GM vaginal cream Place 1 Applicatorful vaginally 3 (three) times a week. 42.5 g 5  . famotidine (PEPCID) 20 MG tablet Take 1 tablet (20 mg total) by mouth daily as needed for heartburn or indigestion. Take with Meloxicam 30 tablet 2  . fluticasone (FLONASE) 50 MCG/ACT nasal spray Place 1 spray into the nose daily as needed for allergies. Reported on 08/06/2015    . levothyroxine (SYNTHROID, LEVOTHROID) 25 MCG tablet Take 25-50 mcg by mouth daily. She takes one tablet daily 4 days per week (Monday-Thursday) and two tablets daily 3 days per week (Friday-Sunday).    Marland Kitchen liothyronine (CYTOMEL) 5 MCG tablet Take 5-10 mcg by mouth 2 (two) times daily. She takes one tablet in the morning and two tablets  midday.    Marland Kitchen MELATONIN-CHAMOMILE PO Take 1 capsule by mouth at bedtime as needed and may repeat dose one time if needed (For sleep.).     Marland Kitchen METRONIDAZOLE, TOPICAL, 0.75 % LOTN Apply 1 application topically at bedtime.    . minoxidil (ROGAINE) 2 % external solution Apply 1 application topically 4 (four) times a week.     . Multiple Vitamin (MULTIVITAMIN WITH MINERALS) TABS tablet Take 1 tablet by mouth daily.    . Omega-3 Fatty Acids (FISH OIL) 1000 MG CAPS Take 1 capsule by mouth daily.    Marland Kitchen pyridoxine (B-6) 200 MG tablet Take 200 mg by mouth daily.    . TURMERIC  PO Take 1 capsule by mouth daily.    Marland Kitchen venlafaxine XR (EFFEXOR-XR) 37.5 MG 24 hr capsule TAKE 1 CAPSULE (37.5 MG TOTAL) BY MOUTH DAILY WITH BREAKFAST. 30 capsule 6  . vitamin B-12 (CYANOCOBALAMIN) 1000 MCG tablet Take 1,000 mcg by mouth daily.    . vitamin C (ASCORBIC ACID) 500 MG tablet Take 500 mg by mouth daily.    . vitamin E 400 UNIT capsule Take 400 Units by mouth daily.    . zoledronic acid (RECLAST) 5 MG/100ML SOLN injection Inject 5 mg into the vein. Once a year    . [DISCONTINUED] escitalopram (LEXAPRO) 10 MG tablet Take 10 mg by mouth daily.      . [DISCONTINUED] Progesterone Micronized (PROGESTERONE, BULK,) POWD      Current Facility-Administered Medications on File Prior to Visit  Medication Dose Route Frequency Provider Last Rate Last Dose  . estradiol (ESTRACE) vaginal cream 1 Applicatorful  1 Applicatorful Vaginal Once per day on Mon Wed Fri Everitt Amber, MD       Allergies  Allergen Reactions  . Astroglide [Gyne-Moistrin] Other (See Comments)    Itching and burning  . Colchicine     diarrhea  . Demerol Nausea And Vomiting  . Penicillins Hives and Other (See Comments)    Face turned red and had a headache. Has patient had a PCN reaction causing immediate rash, facial/tongue/throat swelling, SOB or lightheadedness with hypotension: no Has patient had a PCN reaction causing severe rash involving mucus membranes or  skin necrosis: no Has patient had a PCN reaction that required hospitalization: no Has patient had a PCN reaction occurring within the last 10 years: no If all of the above answers are "NO", then may proceed with Cephalosporin use.  . Shellfish Allergy Other (See Comments)    Intolerance     Review of systems: Constitutional:  She has no weight gain or weight loss. She has no fever or chills. Eyes: No blurred vision Ears, Nose, Mouth, Throat: No dizziness, headaches or changes in hearing. No mouth sores. Cardiovascular: No chest pain, palpitations or edema. Respiratory:  No shortness of breath, wheezing or cough Gastrointestinal: She denies diarrhea. + constipation. She denies any nausea or vomiting. She denies blood in her stool or heart burn. Genitourinary:  She denies pelvic pain, pelvic pressure or changes in her urinary function. She has no hematuria, dysuria, or incontinence. She has no irregular vaginal bleeding or vaginal discharge + genital atrophy Musculoskeletal: Denies muscle weakness or joint pains.  Skin:  She has no skin changes, rashes or itching Neurological:  Denies dizziness or headaches. No neuropathy, no numbness or tingling. Psychiatric:  She denies depression or anxiety. Hematologic/Lymphatic:   No easy bruising or bleeding   Physical Exam: Blood pressure (!) 121/57, pulse 72, resp. rate 18, height 5\' 4"  (1.626 m), weight 140 lb 1.6 oz (63.5 kg), SpO2 98 %. General: Well dressed, well nourished in no apparent distress.   HEENT:  Normocephalic and atraumatic, no lesions.  Extraocular muscles intact. Sclerae anicteric. Pupils equal, round, reactive. No mouth sores or ulcers. Thyroid is normal size, not nodular, midline. Abdomen:  Soft, nontender, nondistended.  No palpable masses.  No hepatosplenomegaly.  No ascites. Normal bowel sounds.  No hernias.  Incisions are well healed. Genitourinary: Normal EGBUS  Vaginal cuff intact.  Atrophy with decreased lubrication and  rugation. No bleeding or discharge.  No cul de sac fullness. Extremities: No cyanosis, clubbing or edema.  No calf tenderness or erythema. No palpable cords. Psychiatric:  Mood and affect are appropriate. Neurological: Awake, alert and oriented x 3. Sensation is intact, no neuropathy.  Musculoskeletal: No pain, normal strength and range of motion.   Donaciano Eva, MD

## 2017-06-17 NOTE — Patient Instructions (Signed)
Please notify Dr Denman George at phone number [phone] if you notice vaginal bleeding, new pelvic or abdominal pains, bloating, feeling full easy, or a change in bladder or bowel function.   Please return to see Dr Denman George in June, 2019.

## 2017-06-19 DIAGNOSIS — J301 Allergic rhinitis due to pollen: Secondary | ICD-10-CM | POA: Diagnosis not present

## 2017-06-19 DIAGNOSIS — J3089 Other allergic rhinitis: Secondary | ICD-10-CM | POA: Diagnosis not present

## 2017-06-24 DIAGNOSIS — J301 Allergic rhinitis due to pollen: Secondary | ICD-10-CM | POA: Diagnosis not present

## 2017-06-24 DIAGNOSIS — J3089 Other allergic rhinitis: Secondary | ICD-10-CM | POA: Diagnosis not present

## 2017-06-29 DIAGNOSIS — M461 Sacroiliitis, not elsewhere classified: Secondary | ICD-10-CM | POA: Diagnosis not present

## 2017-06-29 DIAGNOSIS — R102 Pelvic and perineal pain: Secondary | ICD-10-CM | POA: Diagnosis not present

## 2017-07-01 DIAGNOSIS — J3089 Other allergic rhinitis: Secondary | ICD-10-CM | POA: Diagnosis not present

## 2017-07-01 DIAGNOSIS — J301 Allergic rhinitis due to pollen: Secondary | ICD-10-CM | POA: Diagnosis not present

## 2017-07-07 DIAGNOSIS — J301 Allergic rhinitis due to pollen: Secondary | ICD-10-CM | POA: Diagnosis not present

## 2017-07-07 DIAGNOSIS — M810 Age-related osteoporosis without current pathological fracture: Secondary | ICD-10-CM | POA: Diagnosis not present

## 2017-07-07 DIAGNOSIS — J3089 Other allergic rhinitis: Secondary | ICD-10-CM | POA: Diagnosis not present

## 2017-07-07 LAB — HM DEXA SCAN

## 2017-07-13 DIAGNOSIS — J301 Allergic rhinitis due to pollen: Secondary | ICD-10-CM | POA: Diagnosis not present

## 2017-07-13 DIAGNOSIS — J3089 Other allergic rhinitis: Secondary | ICD-10-CM | POA: Diagnosis not present

## 2017-07-14 DIAGNOSIS — R102 Pelvic and perineal pain: Secondary | ICD-10-CM | POA: Diagnosis not present

## 2017-07-14 DIAGNOSIS — M461 Sacroiliitis, not elsewhere classified: Secondary | ICD-10-CM | POA: Diagnosis not present

## 2017-07-15 DIAGNOSIS — J3089 Other allergic rhinitis: Secondary | ICD-10-CM | POA: Diagnosis not present

## 2017-07-15 DIAGNOSIS — J301 Allergic rhinitis due to pollen: Secondary | ICD-10-CM | POA: Diagnosis not present

## 2017-07-22 DIAGNOSIS — J301 Allergic rhinitis due to pollen: Secondary | ICD-10-CM | POA: Diagnosis not present

## 2017-07-22 DIAGNOSIS — J3089 Other allergic rhinitis: Secondary | ICD-10-CM | POA: Diagnosis not present

## 2017-07-29 DIAGNOSIS — J301 Allergic rhinitis due to pollen: Secondary | ICD-10-CM | POA: Diagnosis not present

## 2017-07-29 DIAGNOSIS — J3089 Other allergic rhinitis: Secondary | ICD-10-CM | POA: Diagnosis not present

## 2017-07-31 DIAGNOSIS — L821 Other seborrheic keratosis: Secondary | ICD-10-CM | POA: Diagnosis not present

## 2017-07-31 DIAGNOSIS — L570 Actinic keratosis: Secondary | ICD-10-CM | POA: Diagnosis not present

## 2017-07-31 DIAGNOSIS — Z85828 Personal history of other malignant neoplasm of skin: Secondary | ICD-10-CM | POA: Diagnosis not present

## 2017-08-04 ENCOUNTER — Encounter: Payer: Self-pay | Admitting: Internal Medicine

## 2017-08-04 NOTE — Progress Notes (Signed)
Outside notes received. Information abstracted. Notes sent to scan.  

## 2017-08-07 DIAGNOSIS — J3089 Other allergic rhinitis: Secondary | ICD-10-CM | POA: Diagnosis not present

## 2017-08-07 DIAGNOSIS — J301 Allergic rhinitis due to pollen: Secondary | ICD-10-CM | POA: Diagnosis not present

## 2017-08-11 DIAGNOSIS — M810 Age-related osteoporosis without current pathological fracture: Secondary | ICD-10-CM | POA: Diagnosis not present

## 2017-08-11 DIAGNOSIS — E039 Hypothyroidism, unspecified: Secondary | ICD-10-CM | POA: Diagnosis not present

## 2017-08-11 DIAGNOSIS — E559 Vitamin D deficiency, unspecified: Secondary | ICD-10-CM | POA: Diagnosis not present

## 2017-08-12 DIAGNOSIS — J3089 Other allergic rhinitis: Secondary | ICD-10-CM | POA: Diagnosis not present

## 2017-08-12 DIAGNOSIS — J301 Allergic rhinitis due to pollen: Secondary | ICD-10-CM | POA: Diagnosis not present

## 2017-08-18 DIAGNOSIS — K080 Exfoliation of teeth due to systemic causes: Secondary | ICD-10-CM | POA: Diagnosis not present

## 2017-08-19 DIAGNOSIS — J3089 Other allergic rhinitis: Secondary | ICD-10-CM | POA: Diagnosis not present

## 2017-08-19 DIAGNOSIS — J301 Allergic rhinitis due to pollen: Secondary | ICD-10-CM | POA: Diagnosis not present

## 2017-08-25 DIAGNOSIS — Z01419 Encounter for gynecological examination (general) (routine) without abnormal findings: Secondary | ICD-10-CM | POA: Diagnosis not present

## 2017-08-25 DIAGNOSIS — Z6823 Body mass index (BMI) 23.0-23.9, adult: Secondary | ICD-10-CM | POA: Diagnosis not present

## 2017-09-08 ENCOUNTER — Telehealth: Payer: Self-pay | Admitting: *Deleted

## 2017-09-08 DIAGNOSIS — J4520 Mild intermittent asthma, uncomplicated: Secondary | ICD-10-CM | POA: Diagnosis not present

## 2017-09-08 DIAGNOSIS — J301 Allergic rhinitis due to pollen: Secondary | ICD-10-CM | POA: Diagnosis not present

## 2017-09-08 DIAGNOSIS — J3089 Other allergic rhinitis: Secondary | ICD-10-CM | POA: Diagnosis not present

## 2017-09-08 DIAGNOSIS — J3081 Allergic rhinitis due to animal (cat) (dog) hair and dander: Secondary | ICD-10-CM | POA: Diagnosis not present

## 2017-09-08 NOTE — Telephone Encounter (Signed)
CALLED PATIENT TO ASK ABOUT ALTERING FU DUE TO DR. KINARD NEEDING TO DO A HDR V[facility], LVM FOR A RETURN CALL

## 2017-09-15 ENCOUNTER — Telehealth: Payer: Self-pay | Admitting: *Deleted

## 2017-09-15 NOTE — Telephone Encounter (Signed)
Returned the patient's call, left a message to call the office back. Patient left message stating that she needs to change an appt.

## 2017-09-17 ENCOUNTER — Ambulatory Visit: Payer: Federal, State, Local not specified - PPO | Admitting: Radiation Oncology

## 2017-09-18 DIAGNOSIS — J3089 Other allergic rhinitis: Secondary | ICD-10-CM | POA: Diagnosis not present

## 2017-09-18 DIAGNOSIS — J301 Allergic rhinitis due to pollen: Secondary | ICD-10-CM | POA: Diagnosis not present

## 2017-09-29 ENCOUNTER — Telehealth: Payer: Self-pay | Admitting: *Deleted

## 2017-09-29 NOTE — Telephone Encounter (Signed)
Patient called and cancelled her appt for June 21st. Patient requested to reschedule her appt to August. Advised the patient to call the office back in June for an appt in August

## 2017-10-02 DIAGNOSIS — J3089 Other allergic rhinitis: Secondary | ICD-10-CM | POA: Diagnosis not present

## 2017-10-02 DIAGNOSIS — J301 Allergic rhinitis due to pollen: Secondary | ICD-10-CM | POA: Diagnosis not present

## 2017-10-20 DIAGNOSIS — J301 Allergic rhinitis due to pollen: Secondary | ICD-10-CM | POA: Diagnosis not present

## 2017-10-20 DIAGNOSIS — J3089 Other allergic rhinitis: Secondary | ICD-10-CM | POA: Diagnosis not present

## 2017-11-03 DIAGNOSIS — J3089 Other allergic rhinitis: Secondary | ICD-10-CM | POA: Diagnosis not present

## 2017-11-03 DIAGNOSIS — J3081 Allergic rhinitis due to animal (cat) (dog) hair and dander: Secondary | ICD-10-CM | POA: Diagnosis not present

## 2017-11-03 DIAGNOSIS — J301 Allergic rhinitis due to pollen: Secondary | ICD-10-CM | POA: Diagnosis not present

## 2017-11-10 DIAGNOSIS — K080 Exfoliation of teeth due to systemic causes: Secondary | ICD-10-CM | POA: Diagnosis not present

## 2017-11-16 ENCOUNTER — Telehealth: Payer: Self-pay | Admitting: Oncology

## 2017-11-16 ENCOUNTER — Ambulatory Visit
Admission: RE | Admit: 2017-11-16 | Discharge: 2017-11-16 | Disposition: A | Discharge status: Home / Self Care | Payer: Federal, State, Local not specified - PPO | Source: Ambulatory Visit | Attending: Radiation Oncology | Admitting: Radiation Oncology

## 2017-11-16 ENCOUNTER — Other Ambulatory Visit: Payer: Self-pay

## 2017-11-16 ENCOUNTER — Encounter: Payer: Self-pay | Admitting: Radiation Oncology

## 2017-11-16 VITALS — BP 125/69 | HR 57 | Temp 98.3°F | Resp 18 | Wt 140.0 lb

## 2017-11-16 DIAGNOSIS — N898 Other specified noninflammatory disorders of vagina: Secondary | ICD-10-CM | POA: Insufficient documentation

## 2017-11-16 DIAGNOSIS — C541 Malignant neoplasm of endometrium: Secondary | ICD-10-CM

## 2017-11-16 DIAGNOSIS — B373 Candidiasis of vulva and vagina: Secondary | ICD-10-CM | POA: Insufficient documentation

## 2017-11-16 DIAGNOSIS — Z08 Encounter for follow-up examination after completed treatment for malignant neoplasm: Secondary | ICD-10-CM | POA: Diagnosis not present

## 2017-11-16 DIAGNOSIS — Z8542 Personal history of malignant neoplasm of other parts of uterus: Secondary | ICD-10-CM | POA: Insufficient documentation

## 2017-11-16 DIAGNOSIS — Z79899 Other long term (current) drug therapy: Secondary | ICD-10-CM | POA: Insufficient documentation

## 2017-11-16 MED ORDER — FLUCONAZOLE 100 MG PO TABS: 100.0000 mg | ORAL_TABLET | Freq: Every day | ORAL | 0 refills | Status: DC

## 2017-11-16 NOTE — Progress Notes (Signed)
Patient is here for a follow-up appointment . Patient denies any vaginal pain. States that she is using estrace. State that she has a creamy white discharge. States that it has a mild odor. Denies any hematuria. Denies any dysuria.States that she has rectal bleeding sometime,but she has hemorrhoids.Patient denies any nausea or vomiting. Patient states that she is not using her dilator.  Vitals:   11/16/17 1025  BP: 125/69  Pulse: (!) 57  Resp: 18  Temp: 98.3 F (36.8 C)  TempSrc: Oral  SpO2: 97%  Weight: 140 lb (63.5 kg)   Wt Readings from Last 3 Encounters:  11/16/17 140 lb (63.5 kg)  06/17/17 140 lb 1.6 oz (63.5 kg)  05/08/17 141 lb (64 kg)

## 2017-11-16 NOTE — Progress Notes (Signed)
Radiation Oncology         [phone] ________________________________  Name: [name] MRN: [id number]  Date: 11/16/2017  DOB: May 06, 1956    Follow-Up Visit Note  CC: Plotnikov, [name], MD  Everitt Amber, MD    ICD-10-CM   1. Endometrial adenocarcinoma (HCC) C54.1     Diagnosis:   Stage IA grade 2 endometrial cancer with LVSI  Interval Since Last Radiation: 2 years, 1 months  Radiation treatment dates:   09/27/2015-10/23/2015  Site/dose:   The vaginal cuff was treated to 30 Gy in 5 fractions at 6 Gy per fraction.   Narrative:  The patient returns today for routine follow-up. She is doing well overall.   She is on Effexor and estrace and she was last seen by Dr. Denman George in December 2018. She notes that she has had issues with chronic yeast infections that she treated with OTC Monistat that temporarily resolved her symptoms for about 2 days. She was recently treated with diflucan in September for yeast related issues.  She has been seen by physical therapy and she notes that the PT exercises that they gave her has helped her urinary and bowel issues. Very little urinary stress incontinence at this time.  On review of systems, she reports vaginal discharge, mild vaginal odor. she denies vaginal bleeding, hematuria, blood in stool, lower back pain, pelvic pain, bowel issues, nausea, and any other symptoms. Pertinent positives are listed and detailed within the above HPI.                               ALLERGIES:  is allergic to astroglide [gyne-moistrin]; colchicine; demerol; penicillins; and shellfish allergy.  Meds: Current Outpatient Medications  Medication Sig Dispense Refill  . Calcium Citrate-Vitamin D (CALCIUM CITRATE +D PO) Take 2 tablets by mouth daily.     . cetirizine (ZYRTEC) 5 MG tablet Take 5 mg by mouth 2 (two) times daily as needed for allergies.    . cholecalciferol (VITAMIN D) 1000 units tablet Take 2,000 Units by mouth daily.    Marland Kitchen EPIPEN 2-PAK 0.3 MG/0.3ML  DEVI Inject 0.3 mg into the muscle once. Reported on 09/03/2015    . ESTRACE VAGINAL 0.1 MG/GM vaginal cream Place 1 Applicatorful vaginally 3 (three) times a week. 42.5 g 5  . famotidine (PEPCID) 20 MG tablet Take 1 tablet (20 mg total) by mouth daily as needed for heartburn or indigestion. Take with Meloxicam 30 tablet 2  . fluticasone (FLONASE) 50 MCG/ACT nasal spray Place 1 spray into the nose daily as needed for allergies. Reported on 08/06/2015    . levothyroxine (SYNTHROID, LEVOTHROID) 25 MCG tablet Take 25-50 mcg by mouth daily. She takes one tablet daily 4 days per week (Monday-Thursday) and two tablets daily 3 days per week (Friday-Sunday).    Marland Kitchen liothyronine (CYTOMEL) 5 MCG tablet Take 5-10 mcg by mouth 2 (two) times daily. She takes one tablet in the morning and two tablets midday.    Marland Kitchen MELATONIN-CHAMOMILE PO Take 1 capsule by mouth at bedtime as needed and may repeat dose one time if needed (For sleep.).     Marland Kitchen METRONIDAZOLE, TOPICAL, 0.75 % LOTN Apply 1 application topically at bedtime.    . minoxidil (ROGAINE) 2 % external solution Apply 1 application topically 4 (four) times a week.     . Multiple Vitamin (MULTIVITAMIN WITH MINERALS) TABS tablet Take 1 tablet by mouth daily.    . Omega-3 Fatty  Acids (FISH OIL) 1000 MG CAPS Take 1 capsule by mouth daily.    Marland Kitchen pyridoxine (B-6) 200 MG tablet Take 200 mg by mouth daily.    . TURMERIC PO Take 1 capsule by mouth daily.    Marland Kitchen venlafaxine XR (EFFEXOR-XR) 37.5 MG 24 hr capsule TAKE 1 CAPSULE (37.5 MG TOTAL) BY MOUTH DAILY WITH BREAKFAST. 30 capsule 6  . vitamin B-12 (CYANOCOBALAMIN) 1000 MCG tablet Take 1,000 mcg by mouth daily.    . vitamin E 400 UNIT capsule Take 400 Units by mouth daily.    . zoledronic acid (RECLAST) 5 MG/100ML SOLN injection Inject 5 mg into the vein. Once a year    . cyclobenzaprine (FLEXERIL) 5 MG tablet Take 1 tablet (5 mg total) 3 (three) times daily as needed by mouth for muscle spasms. (Patient not taking: Reported on  11/16/2017) 30 tablet 1  . diclofenac sodium (VOLTAREN) 1 % GEL Apply 1 application 4 (four) times daily topically. (Patient not taking: Reported on 11/16/2017) 100 g 3  . vitamin C (ASCORBIC ACID) 500 MG tablet Take 500 mg by mouth daily.     Current Facility-Administered Medications  Medication Dose Route Frequency Provider Last Rate Last Dose  . estradiol (ESTRACE) vaginal cream 1 Applicatorful  1 Applicatorful Vaginal Once per day on Mon Wed Fri Everitt Amber, MD        Physical Findings: The patient is in no acute distress. Patient is alert and oriented.  weight is 140 lb (63.5 kg). Her oral temperature is 98.3 F (36.8 C). Her blood pressure is 125/69 and her pulse is 57 (abnormal). Her respiration is 18 and oxygen saturation is 97%. .   Lungs are clear to auscultation bilaterally. Heart has regular rate and rhythm. No palpable cervical, supraclavicular, or axillary adenopathy. Abdomen soft, non-tender, normal bowel sounds. On pelvic examination the external genitalia were unremarkable. A speculum exam was performed. There are no mucosal lesions noted in the vaginal vault. Pt has a significant vaginal yeast infection. On bimanual and rectovaginal examination there were no pelvic masses appreciated.   Lab Findings: Lab Results  Component Value Date   WBC 3.8 (L) 05/13/2017   HGB 13.8 05/13/2017   HCT 40.1 05/13/2017   MCV 93.4 05/13/2017   PLT 210.0 05/13/2017    Radiographic Findings: No results found.  Impression:  No evidence of recurrence on clinical exam today. Significant vaginal yeast infection.  Plan:  Patient will follow up with Dr. Denman George in August, 2019. She will then follow up with Radiation Oncology in November, 2019. She will be placed on 7 day course of diflucan in light of candidal infection which has been resistant to shorter courses of treatment. She will call if this yeast infection does not clear with extended course of  medication.   -----------------------------------  Blair Promise, PhD, MD  This document serves as a record of services personally performed by Gery Pray, MD. It was created on his behalf by [facility], a trained medical scribe. The creation of this record is based on the scribe's personal observations and the provider's statements to them. This document has been checked and approved by the attending provider.

## 2017-11-16 NOTE — Telephone Encounter (Signed)
Left a message for patient notifiying her of appointment with Dr. Denman George, 01/29/18 at 2:30 pm with arrival at 2:15 to check in.

## 2017-11-17 DIAGNOSIS — J3089 Other allergic rhinitis: Secondary | ICD-10-CM | POA: Diagnosis not present

## 2017-11-17 DIAGNOSIS — J301 Allergic rhinitis due to pollen: Secondary | ICD-10-CM | POA: Diagnosis not present

## 2017-12-01 DIAGNOSIS — J301 Allergic rhinitis due to pollen: Secondary | ICD-10-CM | POA: Diagnosis not present

## 2017-12-01 DIAGNOSIS — J3089 Other allergic rhinitis: Secondary | ICD-10-CM | POA: Diagnosis not present

## 2017-12-15 DIAGNOSIS — J3089 Other allergic rhinitis: Secondary | ICD-10-CM | POA: Diagnosis not present

## 2017-12-15 DIAGNOSIS — J301 Allergic rhinitis due to pollen: Secondary | ICD-10-CM | POA: Diagnosis not present

## 2017-12-18 ENCOUNTER — Ambulatory Visit: Payer: Federal, State, Local not specified - PPO | Admitting: Gynecologic Oncology

## 2017-12-29 DIAGNOSIS — J3089 Other allergic rhinitis: Secondary | ICD-10-CM | POA: Diagnosis not present

## 2017-12-29 DIAGNOSIS — J301 Allergic rhinitis due to pollen: Secondary | ICD-10-CM | POA: Diagnosis not present

## 2018-01-07 ENCOUNTER — Other Ambulatory Visit: Payer: Self-pay | Admitting: Gynecologic Oncology

## 2018-01-08 ENCOUNTER — Other Ambulatory Visit: Payer: Self-pay | Admitting: Gynecologic Oncology

## 2018-01-08 DIAGNOSIS — N949 Unspecified condition associated with female genital organs and menstrual cycle: Secondary | ICD-10-CM

## 2018-01-08 MED ORDER — ESTRACE 0.1 MG/GM VA CREA: 1.0000 | TOPICAL_CREAM | VAGINAL | 5 refills | Status: DC

## 2018-01-11 ENCOUNTER — Other Ambulatory Visit: Payer: Self-pay | Admitting: Gynecologic Oncology

## 2018-01-11 DIAGNOSIS — N949 Unspecified condition associated with female genital organs and menstrual cycle: Secondary | ICD-10-CM

## 2018-01-11 MED ORDER — ESTRADIOL 0.1 MG/GM VA CREA: 1.0000 | TOPICAL_CREAM | VAGINAL | 12 refills | Status: DC

## 2018-01-12 DIAGNOSIS — J301 Allergic rhinitis due to pollen: Secondary | ICD-10-CM | POA: Diagnosis not present

## 2018-01-12 DIAGNOSIS — J3089 Other allergic rhinitis: Secondary | ICD-10-CM | POA: Diagnosis not present

## 2018-01-19 ENCOUNTER — Other Ambulatory Visit (HOSPITAL_COMMUNITY)
Admission: RE | Admit: 2018-01-19 | Discharge: 2018-01-19 | Disposition: A | Discharge status: Home / Self Care | Payer: Federal, State, Local not specified - PPO | Source: Ambulatory Visit | Attending: Gynecologic Oncology | Admitting: Gynecologic Oncology

## 2018-01-19 ENCOUNTER — Inpatient Hospital Stay: Payer: Federal, State, Local not specified - PPO | Attending: Gynecologic Oncology | Admitting: Gynecologic Oncology

## 2018-01-19 ENCOUNTER — Encounter: Payer: Self-pay | Admitting: Gynecologic Oncology

## 2018-01-19 VITALS — BP 115/47 | HR 73 | Temp 98.4°F | Resp 20 | Ht 64.5 in | Wt 142.7 lb

## 2018-01-19 DIAGNOSIS — C541 Malignant neoplasm of endometrium: Secondary | ICD-10-CM | POA: Insufficient documentation

## 2018-01-19 DIAGNOSIS — Z90722 Acquired absence of ovaries, bilateral: Secondary | ICD-10-CM

## 2018-01-19 DIAGNOSIS — N761 Subacute and chronic vaginitis: Secondary | ICD-10-CM | POA: Insufficient documentation

## 2018-01-19 DIAGNOSIS — Z923 Personal history of irradiation: Secondary | ICD-10-CM | POA: Diagnosis not present

## 2018-01-19 DIAGNOSIS — N951 Menopausal and female climacteric states: Secondary | ICD-10-CM | POA: Insufficient documentation

## 2018-01-19 DIAGNOSIS — Z90710 Acquired absence of both cervix and uterus: Secondary | ICD-10-CM | POA: Diagnosis not present

## 2018-01-19 DIAGNOSIS — B9689 Other specified bacterial agents as the cause of diseases classified elsewhere: Secondary | ICD-10-CM | POA: Insufficient documentation

## 2018-01-19 DIAGNOSIS — N760 Acute vaginitis: Secondary | ICD-10-CM

## 2018-01-19 MED ORDER — METRONIDAZOLE 0.75 % VA GEL: 1.0000 | Freq: Two times a day (BID) | VAGINAL | 0 refills | Status: AC

## 2018-01-19 NOTE — Patient Instructions (Signed)
Dr Denman George has prescribed vaginal metrogel to use twice daily for 4 days.  Please notify Dr Denman George at phone number [phone] if you notice vaginal bleeding, new pelvic or abdominal pains, bloating, feeling full easy, or a change in bladder or bowel function.   Please return to see her in July, 2020 and Dr Helane Rima sooner (in January, 2020).

## 2018-01-19 NOTE — Progress Notes (Signed)
FOLLOW-UP ENDOMETRIAL CANCER  Assessment and Plan:    62 y.o. year old with Stage IA Grade 2 endometrioid endometrial cancer.   S/p robotic total hysterectomy, BSO, sentinel lymph node biopsy on 08/14/15. Positive LVSI, 12% myometrial invasion, questionably positive pelvic washings (atypical cells) and negative lymph nodes. Adjuvant vaginal radiation completed in April, 2017.   1/ history of endometrial cancer: No sign of recurrence Discussed signs and symptoms of recurrence including vaginal bleeding or discharge, leg pain or swelling and changes in bowel or bladder habits. She was given the opportunity to ask questions, which were answered to her satisfaction, and she is agreement with the above mentioned plan of care. She will eturn to see me in July 2020. She will see Dr Helane Rima in January, 2020.  2/ Genital atrophy of menopause: continue vaginal estrace three times weekly.   3/ Hot flashes: continue effexor.   4/ vaginitis: may be gardnerella  - try metrogel x 4 days.   HPI:  [name] is a 62 y.o. year old G0 initially seen in consultation in February 2016 referred by Dr Helane Rima for grade 2 endometrial cancer. Her only risk factors for this are the exogenous compounded hormones that she took for menopause and her history of nulliparity.  She then underwent a robotic total hysterectomy on 8/46/96 without complications.  Her postoperative course was uncomplicated.  Her final pathologic diagnosis is a Stage IA Grade 2 endometrioid endometrial cancer with positive lymphovascular space invasion, 2/18 mm (12%) of myometrial invasion and negative lymph nodes. Washings showed atypical cells but these were inconclusive for being malignant.  Due to high/intermediate risk factors she received vaginal brachytherapy (30 Gy in 5 fractions) between 09/27/15 to 10/23/15.  Interval Hx: She is seen today for a routine surveillance visit. She has improved deep dyspareunia and sensation of shortened vagina.   Her incontinence is better with pelvic PT. She has had recurrent Yeast infections since a treatment with antibiotics for bronchitis (get better with treatment, then immediately return).   Past Medical History:  Diagnosis Date  . ALLERGIC RHINITIS 03/02/2007  . Anemia   . ANXIETY 03/02/2007  . Arthritis   . ASTHMA 11/12/2007   Dr Orvil Feil  . Cancer (HCC)    skin, hx of  . Dysrhythmia    pt. states at night will notice a different heart rhythem  . Endometrial cancer ([place])   . FIBROCYSTIC BREAST DISEASE, HX OF 03/02/2007  . GERD 03/02/2007  . HYPOTHYROIDISM 11/12/2007   Dr Chalmers Cater  . Neuromuscular disorder (HCC)    carpel tunnel syndrome, compression of ulner nerve at elbows  . OSTEOPENIA 11/12/2007  . PEPTIC ULCER DISEASE 03/02/2007  . Pneumonia    hx. of  . Radiation 09/27/15-10/23/15   HDR to vaginal cuff 30 Gy  . TMJ SYNDROME 11/12/2007   Past Surgical History:  Procedure Laterality Date  . 2008 TMJ surgery     . carpel tunnel Right 11/20/2016  . colonoscopy x 2    . cubital tunnel release Right 11/20/2016  . deviated septum surgery - 2007    . DILATION AND CURETTAGE OF UTERUS     2017  . endoscopy - 1990    . Mandib advancement forward  2009  . ROBOTIC ASSISTED TOTAL HYSTERECTOMY WITH BILATERAL SALPINGO OOPHERECTOMY Bilateral 08/14/2015   Procedure: XI ROBOTIC ASSISTED TOTAL HYSTERECTOMY WITH BILATERAL SALPINGO OOPHORECTOMY AND SENTAL LYMPH NODE BIOPSY;  Surgeon: Everitt Amber, MD;  Location: [facility];  Service: Gynecology;  Laterality: Bilateral;  . 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 Hypertension Father   . Heart disease Father 58       chf  . Heart disease Mother 50       CAD  . Hypertension Other   . Asthma Neg Hx    Social History   Socioeconomic History  . Marital status: Married    Spouse name: Not on file  . Number of children: Not on file  . Years of education: Not on file  . Highest education  level: Not on file  Occupational History  . Occupation: Community education officer at [employer]  . Financial resource strain: Not on file  . Food insecurity:    Worry: Not on file    Inability: Not on file  . Transportation needs:    Medical: Not on file    Non-medical: Not on file  Tobacco Use  . Smoking status: Never Smoker  . Smokeless tobacco: Never Used  . Tobacco comment: Married, 1 child  Substance and Sexual Activity  . Alcohol use: Yes    Alcohol/week: 1.2 oz    Types: 1 Glasses of wine, 1 Cans of beer per week    Comment: daily  . Drug use: No  . Sexual activity: Yes  Lifestyle  . Physical activity:    Days per week: Not on file    Minutes per session: Not on file  . Stress: Not on file  Relationships  . Social connections:    Talks on phone: Not on file    Gets together: Not on file    Attends religious service: Not on file    Active member of club or organization: Not on file    Attends meetings of clubs or organizations: Not on file    Relationship status: Not on file  . Intimate partner violence:    Fear of current or ex partner: Not on file    Emotionally abused: Not on file    Physically abused: Not on file    Forced sexual activity: Not on file  Other Topics Concern  . Not on file  Social History Narrative  . Not on file   Current Outpatient Medications on File Prior to Visit  Medication Sig Dispense Refill  . Calcium Citrate-Vitamin D (CALCIUM CITRATE +D PO) Take 2 tablets by mouth daily.     . cetirizine (ZYRTEC) 5 MG tablet Take 5 mg by mouth 2 (two) times daily as needed for allergies.    . cholecalciferol (VITAMIN D) 1000 units tablet Take 2,000 Units by mouth daily.    Marland Kitchen estradiol (ESTRACE) 0.1 MG/GM vaginal cream Place 1 Applicatorful vaginally 3 (three) times a week. 42.5 g 12  . famotidine (PEPCID) 20 MG tablet Take 1 tablet (20 mg total) by mouth daily as needed for heartburn or indigestion. Take with Meloxicam 30 tablet 2  . fluticasone  (FLONASE) 50 MCG/ACT nasal spray Place 1 spray into the nose daily as needed for allergies. Reported on 08/06/2015    . levothyroxine (SYNTHROID, LEVOTHROID) 25 MCG tablet Take 25-50 mcg by mouth daily. She takes one tablet daily 4 days per week (Monday-Thursday) and two tablets daily 3 days per week (Friday-Sunday).    Marland Kitchen liothyronine (CYTOMEL) 5 MCG tablet Take 5-10 mcg by mouth 2 (two) times daily. She takes one tablet in the morning and two tablets midday.    Marland Kitchen MELATONIN-CHAMOMILE PO Take 1  capsule by mouth at bedtime as needed and may repeat dose one time if needed (For sleep.).     Marland Kitchen METRONIDAZOLE, TOPICAL, 0.75 % LOTN Apply 1 application topically at bedtime.    . minoxidil (ROGAINE) 2 % external solution Apply 1 application topically 4 (four) times a week.     . Multiple Vitamin (MULTIVITAMIN WITH MINERALS) TABS tablet Take 1 tablet by mouth daily.    . Omega-3 Fatty Acids (FISH OIL) 1000 MG CAPS Take 1 capsule by mouth daily.    Marland Kitchen pyridoxine (B-6) 200 MG tablet Take 200 mg by mouth daily.    . TURMERIC PO Take 1 capsule by mouth daily.    Marland Kitchen venlafaxine XR (EFFEXOR-XR) 37.5 MG 24 hr capsule TAKE 1 CAPSULE (37.5 MG TOTAL) BY MOUTH DAILY WITH BREAKFAST. 30 capsule 6  . vitamin B-12 (CYANOCOBALAMIN) 1000 MCG tablet Take 1,000 mcg by mouth daily.    . vitamin C (ASCORBIC ACID) 500 MG tablet Take 500 mg by mouth daily.    . vitamin E 400 UNIT capsule Take 400 Units by mouth daily.    Marland Kitchen EPIPEN 2-PAK 0.3 MG/0.3ML DEVI Inject 0.3 mg into the muscle once. Reported on 09/03/2015    . zoledronic acid (RECLAST) 5 MG/100ML SOLN injection Inject 5 mg into the vein. Once a year    . [DISCONTINUED] escitalopram (LEXAPRO) 10 MG tablet Take 10 mg by mouth daily.      . [DISCONTINUED] Progesterone Micronized (PROGESTERONE, BULK,) POWD      No current facility-administered medications on file prior to visit.    Allergies  Allergen Reactions  . Astroglide [Gyne-Moistrin] Other (See Comments)    Itching and  burning  . Colchicine     diarrhea  . Demerol Nausea And Vomiting  . Penicillins Hives and Other (See Comments)    Face turned red and had a headache. Has patient had a PCN reaction causing immediate rash, facial/tongue/throat swelling, SOB or lightheadedness with hypotension: no Has patient had a PCN reaction causing severe rash involving mucus membranes or skin necrosis: no Has patient had a PCN reaction that required hospitalization: no Has patient had a PCN reaction occurring within the last 10 years: no If all of the above answers are "NO", then may proceed with Cephalosporin use.  . Shellfish Allergy Other (See Comments)    Intolerance     Review of systems: Constitutional:  She has no weight gain or weight loss. She has no fever or chills. Eyes: No blurred vision Ears, Nose, Mouth, Throat: No dizziness, headaches or changes in hearing. No mouth sores. Cardiovascular: No chest pain, palpitations or edema. Respiratory:  No shortness of breath, wheezing or cough Gastrointestinal: She denies diarrhea. + constipation. She denies any nausea or vomiting. She denies blood in her stool or heart burn. Genitourinary:  She denies pelvic pain, pelvic pressure or changes in her urinary function. She has no hematuria, dysuria. + incontinence. She has no irregular vaginal bleeding. +vaginal discharge + genital atrophy Musculoskeletal: Denies muscle weakness or joint pains.  Skin:  She has no skin changes, rashes or itching Neurological:  Denies dizziness or headaches. No neuropathy, no numbness or tingling. Psychiatric:  She denies depression or anxiety. Hematologic/Lymphatic:   No easy bruising or bleeding   Physical Exam: Blood pressure (!) 115/47, pulse 73, temperature 98.4 F (36.9 C), temperature source Oral, resp. rate 20, height 5' 4.5" (1.638 m), weight 142 lb 11.2 oz (64.7 kg), SpO2 99 %. General: Well dressed, well nourished in no  apparent distress.   HEENT:  Normocephalic and  atraumatic, no lesions.  Extraocular muscles intact. Sclerae anicteric. Pupils equal, round, reactive. No mouth sores or ulcers. Thyroid is normal size, not nodular, midline. Abdomen:  Soft, nontender, nondistended.  No palpable masses.  No hepatosplenomegaly.  No ascites. Normal bowel sounds.  No hernias.  Incisions are well healed. Genitourinary: Normal EGBUS  Vaginal cuff intact.  Atrophy with decreased lubrication and rugation. No bleeding. White discharge.  No cul de sac fullness. Extremities: No cyanosis, clubbing or edema.  No calf tenderness or erythema. No palpable cords. Psychiatric: Mood and affect are appropriate. Neurological: Awake, alert and oriented x 3. Sensation is intact, no neuropathy.  Musculoskeletal: No pain, normal strength and range of motion.   Thereasa Solo, MD

## 2018-01-21 LAB — CYTOLOGY - PAP
Bacterial vaginitis: NEGATIVE
Candida vaginitis: NEGATIVE
Diagnosis: NEGATIVE
Trichomonas: NEGATIVE

## 2018-01-26 ENCOUNTER — Telehealth: Payer: Self-pay | Admitting: *Deleted

## 2018-01-26 ENCOUNTER — Telehealth: Payer: Self-pay

## 2018-01-26 DIAGNOSIS — J3089 Other allergic rhinitis: Secondary | ICD-10-CM | POA: Diagnosis not present

## 2018-01-26 DIAGNOSIS — J301 Allergic rhinitis due to pollen: Secondary | ICD-10-CM | POA: Diagnosis not present

## 2018-01-26 NOTE — Telephone Encounter (Signed)
Called patient to give her the results of her pap smear from 01/19/18.  Per Joylene John, NP pap smear is normal. Patient verbalized understanding.

## 2018-01-26 NOTE — Telephone Encounter (Signed)
Told Ms Duca that the pap smear was normal.  No evidence of yeast or signs of BV per Melissa cross,NP.

## 2018-01-29 ENCOUNTER — Ambulatory Visit: Payer: Federal, State, Local not specified - PPO | Admitting: Gynecologic Oncology

## 2018-02-19 ENCOUNTER — Ambulatory Visit: Payer: Federal, State, Local not specified - PPO | Admitting: Family Medicine

## 2018-02-19 ENCOUNTER — Ambulatory Visit
Admission: RE | Admit: 2018-02-19 | Discharge: 2018-02-19 | Disposition: A | Discharge status: Home / Self Care | Payer: Federal, State, Local not specified - PPO | Source: Ambulatory Visit | Attending: Family Medicine | Admitting: Family Medicine

## 2018-02-19 VITALS — BP 112/72 | Ht 64.5 in | Wt 139.0 lb

## 2018-02-19 DIAGNOSIS — M79672 Pain in left foot: Secondary | ICD-10-CM

## 2018-02-19 IMAGING — CR DG FOOT COMPLETE 3+V*L*
3 series · 3 of 3 positions shown · non-contrast
Comparison: [DATE]

CLINICAL DATA: Postexercise pain several days ago

EXAM:
LEFT FOOT - COMPLETE 3+ VIEW

[x foot ap left]
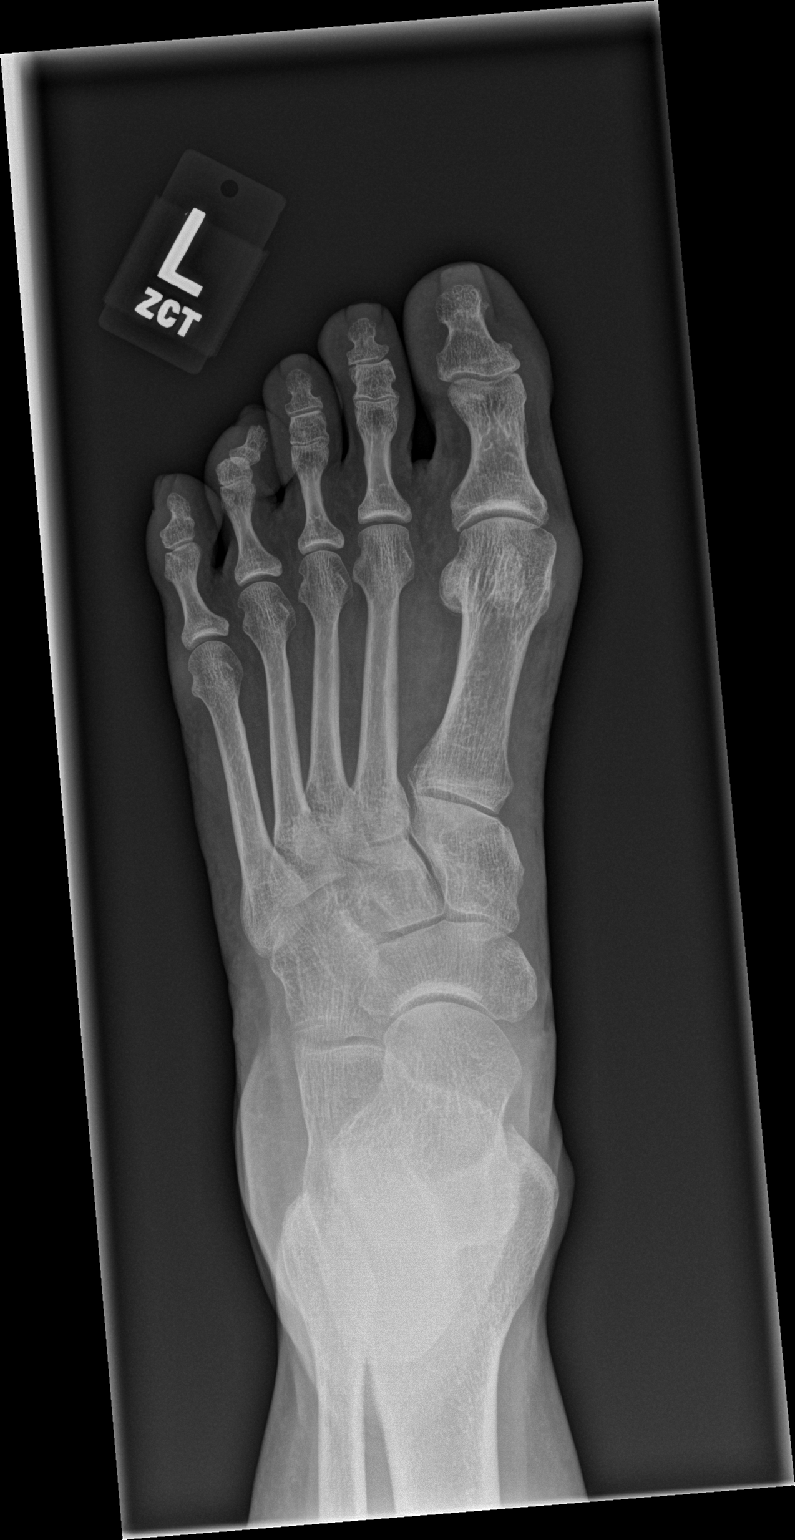

[x foot obl left]
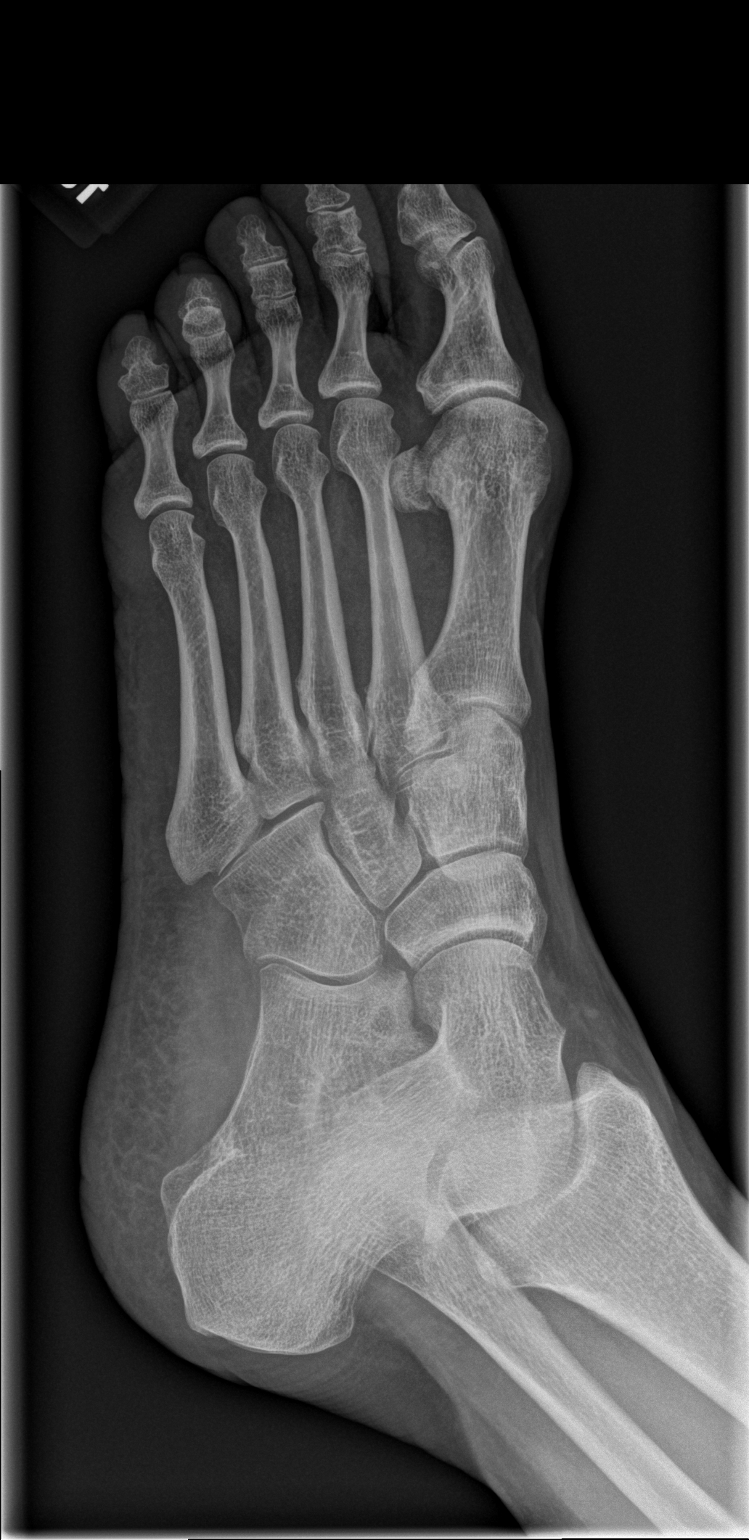

[x foot lat left]
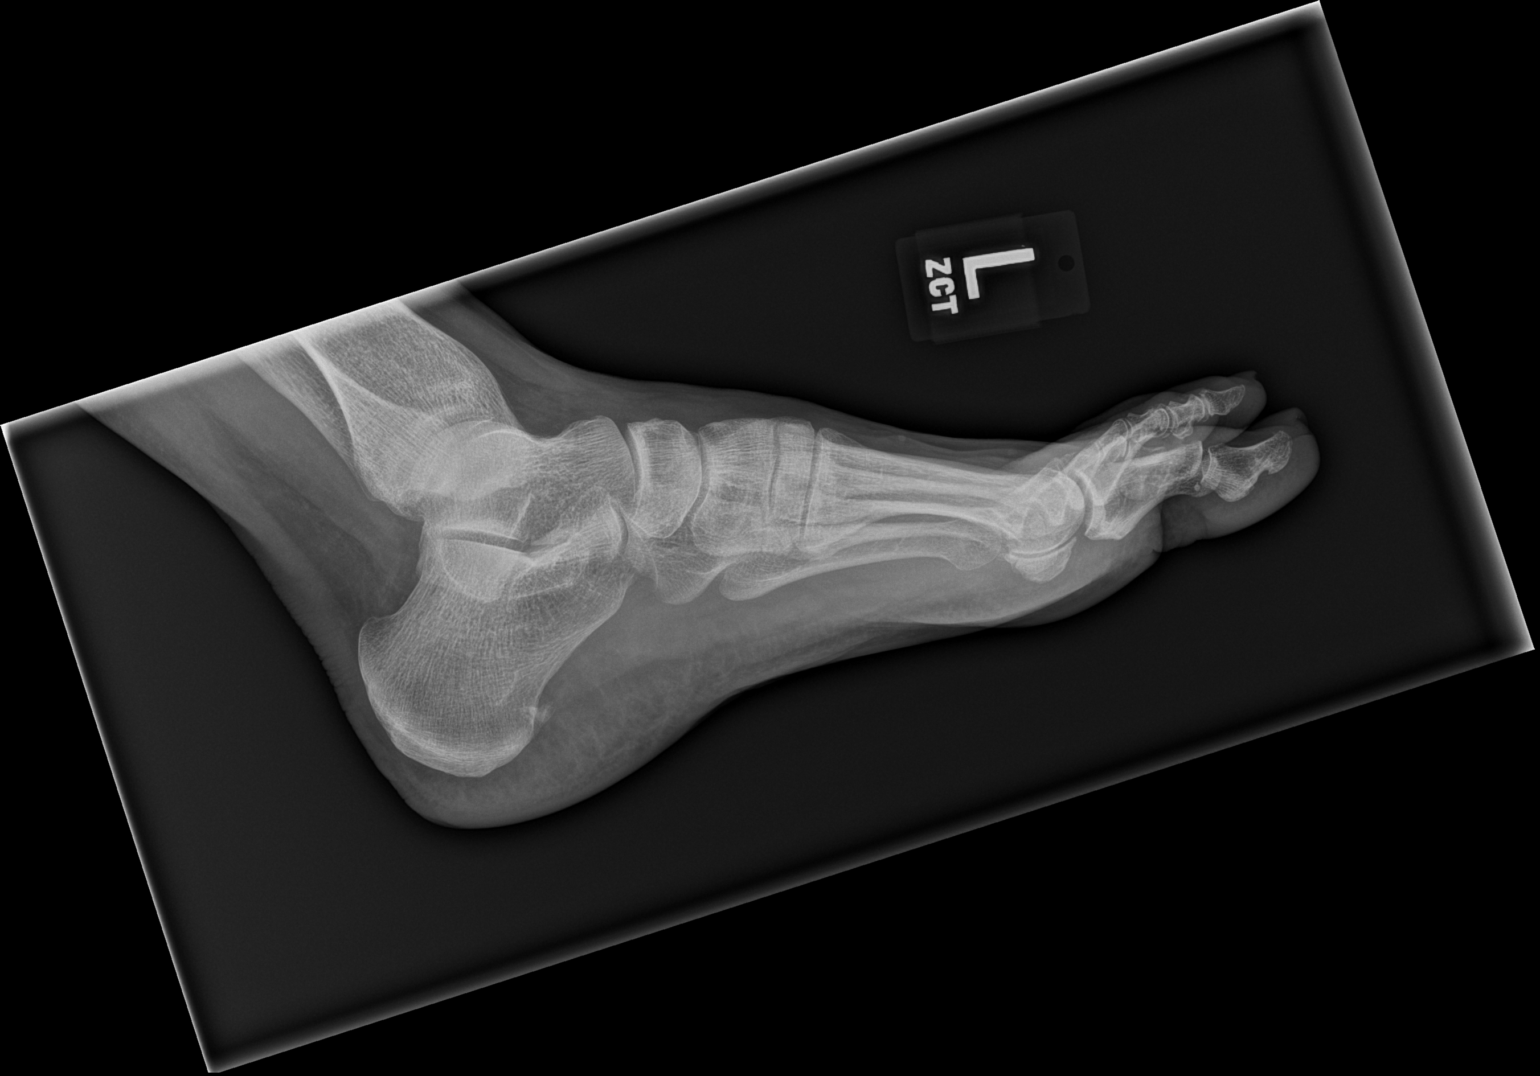

[3 of 3 positions shown; findings below may reference images not displayed]

FINDINGS: There is no evidence of fracture or dislocation. There is no
evidence of arthropathy or other focal bone abnormality. Soft
tissues are unremarkable.
IMPRESSION: No acute abnormality noted.

## 2018-02-19 MED ORDER — NITROGLYCERIN 0.2 MG/HR TD PT24: MEDICATED_PATCH | TRANSDERMAL | 1 refills | Status: DC

## 2018-02-19 NOTE — Progress Notes (Signed)
    CHIEF COMPLAINT / HPI:  Several ldays ago noted pain with left forefoot. Works out Emergency planning/management officer, nothing new and has no memory of specific injury. Has changed brand name of workout shoe in last few weeks. Pain is sharp, located mid forefoot on dorsum,. Worse with movement.Never had before. Some better with rest in [place] pain is barely noticeable unless she is bearing and worst if walking.  REVIEW OF SYSTEMS: No unusual weight change. No fever. No foot numbness or tingling  PERTINENT  PMH / PSH: I have reviewed the patient's medications, allergies, past medical and surgical history, smoking status and updated in the EMR as appropriate.   OBJECTIVE:  GEN WD WN NAD FOOT: Bilaterally she has loss of transverse arch. LEFT foot is TTP (moderate depth) over dista portion of third metatarsal.  Slight pain wiith application of arch strap but none with squeeze of forefoot.  GAIT: Pain is reproduced on toe off portion of gait. SKIN: no redness or unusual warmth, no skin lesions or rash over left forefoot area.  VASC: normal cap refill. DP 2+B=  [place]: a small  Amount of excess fluid is seen in the soft tissue underly8ing area of tenderness. No cortex defect is noted on any of MT shafts. No increased doppler activity noted.  ASSESSMENT / PLAN:  Metatarsal pain. Could be early stress injury. Nothing seen on [place] including no increased blood flow noted on doppler assessment. Will treat as if bony stress injury vs soft tissue injury (tendonitis, ligamentous strain) of forefoot. As she has upcoming hike at [facility] in one month, we want to be aggressive. Will do post op shoe, Plain films, nitro patch and f/u 3 weeks.

## 2018-02-23 ENCOUNTER — Encounter: Payer: Self-pay | Admitting: Family Medicine

## 2018-02-23 DIAGNOSIS — K080 Exfoliation of teeth due to systemic causes: Secondary | ICD-10-CM | POA: Diagnosis not present

## 2018-02-24 ENCOUNTER — Ambulatory Visit: Payer: Federal, State, Local not specified - PPO | Admitting: Gynecologic Oncology

## 2018-03-12 ENCOUNTER — Encounter: Payer: Self-pay | Admitting: Family Medicine

## 2018-03-12 ENCOUNTER — Ambulatory Visit: Payer: Federal, State, Local not specified - PPO | Admitting: Family Medicine

## 2018-03-12 VITALS — BP 122/62 | Ht 64.5 in | Wt 139.0 lb

## 2018-03-12 DIAGNOSIS — M84374A Stress fracture, right foot, initial encounter for fracture: Secondary | ICD-10-CM | POA: Insufficient documentation

## 2018-03-12 DIAGNOSIS — M7742 Metatarsalgia, left foot: Secondary | ICD-10-CM | POA: Diagnosis not present

## 2018-03-12 DIAGNOSIS — M76891 Other specified enthesopathies of right lower limb, excluding foot: Secondary | ICD-10-CM | POA: Diagnosis not present

## 2018-03-12 DIAGNOSIS — M722 Plantar fascial fibromatosis: Secondary | ICD-10-CM | POA: Diagnosis not present

## 2018-03-12 DIAGNOSIS — M84375D Stress fracture, left foot, subsequent encounter for fracture with routine healing: Secondary | ICD-10-CM | POA: Diagnosis not present

## 2018-03-12 DIAGNOSIS — M79675 Pain in left toe(s): Secondary | ICD-10-CM

## 2018-03-12 DIAGNOSIS — M84375A Stress fracture, left foot, initial encounter for fracture: Secondary | ICD-10-CM | POA: Insufficient documentation

## 2018-03-12 MED ORDER — TRAMADOL HCL 50 MG PO TABS: 50.0000 mg | ORAL_TABLET | Freq: Every evening | ORAL | 0 refills | Status: DC | PRN

## 2018-03-12 NOTE — Assessment & Plan Note (Signed)
-   continue hamstring stretches and strengthening exercises - recommend a hamstring strap to provide a degree of compression - if not improving with 4-6 weeks of exercises, will ultrasound and consider other treatment modalities, including possible injection

## 2018-03-12 NOTE — Patient Instructions (Signed)
Today we saw you for follow up of a stress fracture of your left 4th metatarsal.  1. Continue nitro patch until you are pain free. Once you are pain free, then continue it for an additional week.  2. Wear the stiff shoe insert in your hiking boots underneath the sole.  3. Take the Tramadol 50mg  at night if you are having pain after hiking. This is a narcotic so be careful about drowsiness, constipation, or sedation.  4. Continue hamstring strengthening exercises and consider a hamstring strap. Mueller or Yosoo are two brands that seem to work well on [employer].  5. Follow up in 3-4 weeks after your trip if you are still having pain.

## 2018-03-12 NOTE — Progress Notes (Signed)
ELSIE BAYNES - 62 y.o. female MRN [id number]  Date of birth: 26-Dec-1955   Chief complaint: L foot injury, R posterior thigh pain  SUBJECTIVE:    History of present illness: 62 year old female who presents today for follow-up of a left foot injury with presumed stress fracture.  She was last seen approximately 3 weeks ago and diagnosed with a probable fourth metatarsal stress fracture at the base.  She has been treated in a postoperative shoe and with a nitroglycerin patch since then.  She reports her pain has slightly improved.  She is to have pain all the time when walking in the postoperative shoe.  Now, she only has intermittent pain when doing this.  It is still a sharp pain if she walks barefoot around the house.  She denies any new symptoms of numbness or tingling.  Denies any ankle pain or knee pain.  She does have a history of osteoporosis.  She has been taking acetaminophen to help with the pain which does help.  Progressive walking or going up on her toes does exacerbate her symptoms.  No associated rashes or further traumatic injuries.  This injury first started when she was doing a high intensity workout which she had not done before.  Of note, she is going hiking to [facility] in the next 10 days and she is worried that this injury is going to bother her when hiking.  She does wear rigid hiking boots when doing this.   Review of systems:  As stated above   Interval past medical history, surgical history, family history, and social history obtained and are unchanged.   Pertinent past medical history include osteoporosis, peptic ulcer disease, asthma, and hypothyroidism.  She is a non-smoker.   Medications reviewed and unchanged.  Pertinent medications include: Vitamin D and calcium and Reclast. Allergies reviewed and unchanged.  Of note she is allergic to penicillin.  OBJECTIVE:  Physical exam: Vital signs are reviewed. BP 122/62   Ht 5' 4.5" (1.638 m)   Wt 139 lb  (63 kg)   BMI 23.49 kg/m   Gen.: Alert, oriented, appears stated age, in no apparent distress Integumentary: No rashes, ecchymosis, or erythema Neurologic: sensation intact to light touch L4-S1 Gait: mild limp while ambulating in a post-operative shoe on the L Musculoskeletal: Inspection of the left foot demonstrates trace edema in the midfoot.  She has significant tenderness palpation at the base of the third and fourth metatarsals.  She also has tenderness with joint play of the third and fourth digits on the left foot.  She has full range of motion foot dorsiflexion plantarflexion.  Strength testing is 5 out of 5 in dorsiflexion plantarflexion.  She is unable to bear complete weight on that foot secondary to pain and discomfort.  She is unable to perform a single-leg at squat or jump on the left foot.  Her ankle is stable to ligamentous testing.  She is neurovascularly intact.  Negative calcaneal squeeze.  Inspection of the right posterior thigh demonstrates no acute findings.  She has no significant tenderness to palpation in the proximal hamstring on the right posterior thigh.  She has full range of motion in hip flexion, extension, abduction and adduction.  Strength testing is 4.5 out of 5 in hip extension, 5 out of 5 in hip flexion.  She has mild pain with resisted extension of her hip.  Negative logroll test.  Negative Corky Sox and Fabere testing of the hip.    ASSESSMENT & PLAN:  Metatarsal stress fracture of left foot - still tender on exam today - continue post op shoe, transition to steel insert into hiking boot in preparation for trip - Tramadol Rx given 10 pills to be taken at night if painful - Continue nitro patch until pain free, then continue for 1 additional week - may take acetaminophen for pain - avoid longer term NSAIDs as it may delay bone healing  Hamstring tendinitis of right thigh - currently flared - continue hamstring stretches and strengthening exercises - recommend a  hamstring strap to provide a degree of compression - if not improving with 4-6 weeks of exercises, will ultrasound and consider other treatment modalities, including possible injection    Meds ordered this encounter  Medications  . traMADol (ULTRAM) 50 MG tablet    Sig: Take 1 tablet (50 mg total) by mouth at bedtime as needed.    Dispense:  10 tablet    Refill:  0      Clydene Laming, DO Sports Medicine Fellow [facility]

## 2018-03-12 NOTE — Assessment & Plan Note (Signed)
-   still tender on exam today - continue post op shoe, transition to steel insert into hiking boot in preparation for trip - Tramadol Rx given 10 pills to be taken at night if painful - Continue nitro patch until pain free, then continue for 1 additional week - may take acetaminophen for pain - avoid longer term NSAIDs as it may delay bone healing

## 2018-03-13 NOTE — Progress Notes (Signed)
[facility]: Attending Note: I have reviewed the chart, discussed wit the Sports Medicine Fellow. I agree with assessment and treatment plan as detailed in the [facility] N[facility]honi note. Stress fracture of foot; some better . Has big trip to [facility]. Will use shank in hiking shoe for support. Small number tramadol for breakthrough pain. Instructions on when to return.Continue nitro patch until 1 week post pain resolution. RTC if not resolved 3-4 weeks after return from vacation.

## 2018-03-16 DIAGNOSIS — J301 Allergic rhinitis due to pollen: Secondary | ICD-10-CM | POA: Diagnosis not present

## 2018-03-16 DIAGNOSIS — J3089 Other allergic rhinitis: Secondary | ICD-10-CM | POA: Diagnosis not present

## 2018-04-16 DIAGNOSIS — J301 Allergic rhinitis due to pollen: Secondary | ICD-10-CM | POA: Diagnosis not present

## 2018-04-16 DIAGNOSIS — J3089 Other allergic rhinitis: Secondary | ICD-10-CM | POA: Diagnosis not present

## 2018-04-26 DIAGNOSIS — J3089 Other allergic rhinitis: Secondary | ICD-10-CM | POA: Diagnosis not present

## 2018-04-26 DIAGNOSIS — J301 Allergic rhinitis due to pollen: Secondary | ICD-10-CM | POA: Diagnosis not present

## 2018-05-18 ENCOUNTER — Telehealth: Payer: Self-pay | Admitting: *Deleted

## 2018-05-18 NOTE — Telephone Encounter (Signed)
Patient has decided to cancel her appt with Dr. Sondra Come for November. She stated "I'm going to the appts like Dr. Denman George wanted in July. I see Dr. Helane Rima in January and Dr. Denman George in July. Please cancel appt." Notified Dr. Earney Hamburg and his office

## 2018-05-20 ENCOUNTER — Encounter: Payer: Self-pay | Admitting: Radiation Oncology

## 2018-05-20 ENCOUNTER — Ambulatory Visit
Admission: RE | Admit: 2018-05-20 | Discharge: 2018-05-20 | Disposition: A | Discharge status: Home / Self Care | Payer: Federal, State, Local not specified - PPO | Source: Ambulatory Visit | Attending: Radiation Oncology | Admitting: Radiation Oncology

## 2018-05-20 ENCOUNTER — Ambulatory Visit: Payer: Federal, State, Local not specified - PPO | Admitting: Radiation Oncology

## 2018-05-20 ENCOUNTER — Other Ambulatory Visit: Payer: Self-pay

## 2018-05-20 VITALS — BP 127/65 | HR 64 | Temp 98.9°F | Resp 16 | Ht 64.5 in | Wt 139.0 lb

## 2018-05-20 DIAGNOSIS — Z79899 Other long term (current) drug therapy: Secondary | ICD-10-CM | POA: Diagnosis not present

## 2018-05-20 DIAGNOSIS — K649 Unspecified hemorrhoids: Secondary | ICD-10-CM | POA: Insufficient documentation

## 2018-05-20 DIAGNOSIS — Z923 Personal history of irradiation: Secondary | ICD-10-CM | POA: Insufficient documentation

## 2018-05-20 DIAGNOSIS — Z8542 Personal history of malignant neoplasm of other parts of uterus: Secondary | ICD-10-CM | POA: Insufficient documentation

## 2018-05-20 DIAGNOSIS — C541 Malignant neoplasm of endometrium: Secondary | ICD-10-CM

## 2018-05-20 DIAGNOSIS — R32 Unspecified urinary incontinence: Secondary | ICD-10-CM | POA: Diagnosis not present

## 2018-05-20 DIAGNOSIS — Z08 Encounter for follow-up examination after completed treatment for malignant neoplasm: Secondary | ICD-10-CM | POA: Diagnosis not present

## 2018-05-20 NOTE — Progress Notes (Signed)
Pt presents today for f/u with Dr. Sondra Come. Pt is unaccompanied. Pt denies dysuria/hematuria. Pt denies vaginal bleeding/discharge. Pt reports hemorrhoidal bleeding, intermittently. Pt denies diarrhea/constipation but does state she is not as regular with BMs as she was prior to radiation.   BP 127/65 (BP Location: Left Arm, Patient Position: Sitting)   Pulse 64   Temp 98.9 F (37.2 C) (Oral)   Resp 16   Ht 5' 4.5" (1.638 m)   Wt 139 lb (63 kg)   SpO2 96%   BMI 23.49 kg/m   Wt Readings from Last 3 Encounters:  05/20/18 139 lb (63 kg)  03/12/18 139 lb (63 kg)  02/19/18 139 lb (63 kg)   Loma Sousa, RN BSN

## 2018-05-20 NOTE — Progress Notes (Signed)
Radiation Oncology         [phone] ________________________________  Name: [name] MRN: [id number]  Date: 05/20/2018  DOB: 05-12-1956  Follow-Up Visit Note  CC: Plotnikov, [name], MD  Everitt Amber, MD    ICD-10-CM   1. Endometrial adenocarcinoma (HCC) C54.1     Diagnosis:   Stage IA grade 2 endometrial cancer with LVSI  Interval Since Last Radiation:  2 years 7 months  Radiation treatment dates:   09/27/2015-10/23/2015 Site/dose:   The vaginal cuff was treated to 30 Gy in 5 fractions at 6 Gy per fraction.   Narrative:  The patient returns today for routine follow-up.  She states that she had recurrent yeast infections, even after 7-day course of Diflucan. She last saw Dr. Denman George 4 months ago with no evidence of recurrence. Her Pap smear at that time was negative. She states that her yeast infection cleared up on its own over the past few months.  She denies dysuria or hematuria. She reports continued urinary incontinence, managed well with physical therapy. She denies any vaginal bleeding or discharge. She reports uncomfortable intercourse. She reports intermittent hemorrhoidal bleeding. She denies diarrhea or constipation but does state she is not as regular with bowel movements as she was prior to radiation. She does take probiotics. She reports being on nitroglycerin patch for a stress fracture in her left foot two months ago, as well as hamstring tendinitis of the right thigh. She reports having a cold currently with a fever 2-3 days ago.     ALLERGIES:  is allergic to astroglide [gyne-moistrin]; colchicine; demerol; penicillins; and shellfish allergy.  Meds: Current Outpatient Medications  Medication Sig Dispense Refill  . Calcium Citrate-Vitamin D (CALCIUM CITRATE +D PO) Take 2 tablets by mouth daily.     . cetirizine (ZYRTEC) 5 MG tablet Take 5 mg by mouth 2 (two) times daily as needed for allergies.    . cholecalciferol (VITAMIN D) 1000 units tablet Take 2,000  Units by mouth daily.    Marland Kitchen EPIPEN 2-PAK 0.3 MG/0.3ML DEVI Inject 0.3 mg into the muscle once. Reported on 09/03/2015    . famotidine (PEPCID) 20 MG tablet Take 1 tablet (20 mg total) by mouth daily as needed for heartburn or indigestion. Take with Meloxicam 30 tablet 2  . fluticasone (FLONASE) 50 MCG/ACT nasal spray Place 1 spray into the nose daily as needed for allergies. Reported on 08/06/2015    . levothyroxine (SYNTHROID, LEVOTHROID) 25 MCG tablet Take 25-50 mcg by mouth daily. She takes one tablet daily 4 days per week (Monday-Thursday) and two tablets daily 3 days per week (Friday-Sunday).    Marland Kitchen liothyronine (CYTOMEL) 5 MCG tablet Take 5-10 mcg by mouth 2 (two) times daily. She takes one tablet in the morning and two tablets midday.    Marland Kitchen MELATONIN-CHAMOMILE PO Take 1 capsule by mouth at bedtime as needed and may repeat dose one time if needed (For sleep.).     Marland Kitchen METRONIDAZOLE, TOPICAL, 0.75 % LOTN Apply 1 application topically at bedtime.    . minoxidil (ROGAINE) 2 % external solution Apply 1 application topically 4 (four) times a week.     . Multiple Vitamin (MULTIVITAMIN WITH MINERALS) TABS tablet Take 1 tablet by mouth daily.    . nitroGLYCERIN (NITRODUR - DOSED IN MG/24 HR) 0.2 mg/hr patch Cut patch into one - fourth pieces Place a one fourth piece of patch on  skin over affected area, changing to a new piece every 24 hours. 30 patch  1  . Omega-3 Fatty Acids (FISH OIL) 1000 MG CAPS Take 1 capsule by mouth daily.    Marland Kitchen pyridoxine (B-6) 200 MG tablet Take 200 mg by mouth daily.    . traMADol (ULTRAM) 50 MG tablet Take 1 tablet (50 mg total) by mouth at bedtime as needed. 10 tablet 0  . TURMERIC PO Take 1 capsule by mouth daily.    Marland Kitchen venlafaxine XR (EFFEXOR-XR) 37.5 MG 24 hr capsule TAKE 1 CAPSULE (37.5 MG TOTAL) BY MOUTH DAILY WITH BREAKFAST. 30 capsule 6  . vitamin B-12 (CYANOCOBALAMIN) 1000 MCG tablet Take 1,000 mcg by mouth daily.    . vitamin C (ASCORBIC ACID) 500 MG tablet Take 500 mg by  mouth daily.    . vitamin E 400 UNIT capsule Take 400 Units by mouth daily.    . zoledronic acid (RECLAST) 5 MG/100ML SOLN injection Inject 5 mg into the vein. Once a year    . estradiol (ESTRACE) 0.1 MG/GM vaginal cream Place 1 Applicatorful vaginally 3 (three) times a week. 42.5 g 12   No current facility-administered medications for this encounter.     Physical Findings: The patient is in no acute distress. Patient is alert and oriented.  height is 5' 4.5" (1.638 m) and weight is 139 lb (63 kg). Her oral temperature is 98.9 F (37.2 C). Her blood pressure is 127/65 and her pulse is 64. Her respiration is 16 and oxygen saturation is 96%.  Lungs are clear to auscultation bilaterally. Heart has regular rate and rhythm. No palpable cervical, supraclavicular, or axillary adenopathy. Abdomen soft, non-tender, normal bowel sounds.  On pelvic examination the external genitalia were unremarkable. A speculum exam was performed. There are no mucosal lesions noted in the vaginal vault. vaginal cuff intact. On bimanual and rectovaginal examination there were no pelvic masses appreciated.  Lab Findings: Lab Results  Component Value Date   WBC 3.8 (L) 05/13/2017   HGB 13.8 05/13/2017   HCT 40.1 05/13/2017   MCV 93.4 05/13/2017   PLT 210.0 05/13/2017    Radiographic Findings: No results found.  Impression:  No evidence of recurrence on clinical exam. Urinary incontinence better with physical therapy  Plan:  Follow up with Dr. Helane Rima in late February and Dr. Denman George next summer. She will then alternate follow-ups with Dr. Denman George and Dr. Helane Rima until 5 years out from her treatment. PRN follow-up in radiation oncology.  ____________________________________  Blair Promise, PhD, MD  This document serves as a record of services personally performed by Gery Pray, MD. It was created on his behalf by Rae Lips, a trained medical scribe. The creation of this record is based on the scribe's  personal observations and the provider's statements to them. This document has been checked and approved by the attending provider.

## 2018-05-25 ENCOUNTER — Ambulatory Visit: Payer: Federal, State, Local not specified - PPO | Admitting: Family

## 2018-05-25 ENCOUNTER — Encounter: Payer: Self-pay | Admitting: Family

## 2018-05-25 VITALS — BP 118/80 | HR 70 | Temp 98.3°F | Ht 64.5 in | Wt 139.1 lb

## 2018-05-25 DIAGNOSIS — J019 Acute sinusitis, unspecified: Secondary | ICD-10-CM

## 2018-05-25 MED ORDER — PREDNISONE 20 MG PO TABS: 20.0000 mg | ORAL_TABLET | Freq: Every day | ORAL | 0 refills | Status: DC

## 2018-05-25 MED ORDER — DOXYCYCLINE HYCLATE 100 MG PO TABS: 100.0000 mg | ORAL_TABLET | Freq: Two times a day (BID) | ORAL | 0 refills | Status: DC

## 2018-05-25 NOTE — Progress Notes (Signed)
[name] is a 62 y.o. female with the following history as recorded in [employer]:  Patient Active Problem List   Diagnosis Date Noted  . Metatarsal stress fracture of left foot 03/12/2018  . Upper respiratory infection 02/04/2017  . Metatarsalgia of left foot 10/16/2016  . Morton's neuroma of left foot 04/01/2016  . Loss of transverse plantar arch of left foot 04/01/2016  . Toe pain, left 03/04/2016  . Arthralgia 03/04/2016  . Genital atrophy of female 02/18/2016  . Endometrial adenocarcinoma ([place]) 08/06/2015  . Anemia 12/01/2014  . Plantar fasciitis 01/20/2011  . Hamstring tendinitis of right thigh 01/20/2011  . Sacroiliac dysfunction 12/10/2010  . Well adult exam 11/17/2010  . Paresthesia 11/17/2010  . Hamstring tendonitis at origin 10/28/2010  . Plantar fascia syndrome 10/07/2010  . HYPOTHYROIDISM 11/12/2007  . Asthma 11/12/2007  . TMJ SYNDROME 11/12/2007  . Osteoporosis 11/12/2007  . Anxiety state 03/02/2007  . ALLERGIC RHINITIS 03/02/2007  . GERD 03/02/2007  . PEPTIC ULCER DISEASE 03/02/2007  . SKIN CANCER, HX OF 03/02/2007  . FIBROCYSTIC BREAST DISEASE, HX OF 03/02/2007    Current Outpatient Medications  Medication Sig Dispense Refill  . Calcium Citrate-Vitamin D (CALCIUM CITRATE +D PO) Take 2 tablets by mouth daily.     . cetirizine (ZYRTEC) 5 MG tablet Take 5 mg by mouth 2 (two) times daily as needed for allergies.    . cholecalciferol (VITAMIN D) 1000 units tablet Take 2,000 Units by mouth daily.    Marland Kitchen EPIPEN 2-PAK 0.3 MG/0.3ML DEVI Inject 0.3 mg into the muscle once. Reported on 09/03/2015    . estradiol (ESTRACE) 0.1 MG/GM vaginal cream Place 1 Applicatorful vaginally 3 (three) times a week. 42.5 g 12  . famotidine (PEPCID) 20 MG tablet Take 1 tablet (20 mg total) by mouth daily as needed for heartburn or indigestion. Take with Meloxicam 30 tablet 2  . fluticasone (FLONASE) 50 MCG/ACT nasal spray Place 1 spray into the nose daily as needed for allergies.  Reported on 08/06/2015    . levothyroxine (SYNTHROID, LEVOTHROID) 25 MCG tablet Take 25-50 mcg by mouth daily. She takes one tablet daily 4 days per week (Monday-Thursday) and two tablets daily 3 days per week (Friday-Sunday).    Marland Kitchen liothyronine (CYTOMEL) 5 MCG tablet Take 5-10 mcg by mouth 2 (two) times daily. She takes one tablet in the morning and two tablets midday.    Marland Kitchen MELATONIN-CHAMOMILE PO Take 1 capsule by mouth at bedtime as needed and may repeat dose one time if needed (For sleep.).     Marland Kitchen METRONIDAZOLE, TOPICAL, 0.75 % LOTN Apply 1 application topically at bedtime.    . minoxidil (ROGAINE) 2 % external solution Apply 1 application topically 4 (four) times a week.     . Multiple Vitamin (MULTIVITAMIN WITH MINERALS) TABS tablet Take 1 tablet by mouth daily.    . Omega-3 Fatty Acids (FISH OIL) 1000 MG CAPS Take 1 capsule by mouth daily.    Marland Kitchen pyridoxine (B-6) 200 MG tablet Take 200 mg by mouth daily.    . TURMERIC PO Take 1 capsule by mouth daily.    Marland Kitchen venlafaxine XR (EFFEXOR-XR) 37.5 MG 24 hr capsule TAKE 1 CAPSULE (37.5 MG TOTAL) BY MOUTH DAILY WITH BREAKFAST. 30 capsule 6  . vitamin B-12 (CYANOCOBALAMIN) 1000 MCG tablet Take 1,000 mcg by mouth daily.    . vitamin C (ASCORBIC ACID) 500 MG tablet Take 500 mg by mouth daily.    . vitamin E 400 UNIT capsule Take 400 Units  by mouth daily.    . zoledronic acid (RECLAST) 5 MG/100ML SOLN injection Inject 5 mg into the vein. Once a year    . doxycycline (VIBRA-TABS) 100 MG tablet Take 1 tablet (100 mg total) by mouth 2 (two) times daily. 20 tablet 0  . predniSONE (DELTASONE) 20 MG tablet Take 1 tablet (20 mg total) by mouth daily with breakfast. 5 tablet 0   No current facility-administered medications for this visit.     Allergies: Astroglide [gyne-moistrin]; Colchicine; Demerol; Penicillins; and Shellfish allergy  Past Medical History:  Diagnosis Date  . ALLERGIC RHINITIS 03/02/2007  . Anemia   . ANXIETY 03/02/2007  . Arthritis   . ASTHMA  11/12/2007   Dr Orvil Feil  . Cancer (HCC)    skin, hx of  . Dysrhythmia    pt. states at night will notice a different heart rhythem  . Endometrial cancer ([place])   . FIBROCYSTIC BREAST DISEASE, HX OF 03/02/2007  . GERD 03/02/2007  . HYPOTHYROIDISM 11/12/2007   Dr Chalmers Cater  . Neuromuscular disorder (HCC)    carpel tunnel syndrome, compression of ulner nerve at elbows  . OSTEOPENIA 11/12/2007  . PEPTIC ULCER DISEASE 03/02/2007  . Pneumonia    hx. of  . Radiation 09/27/15-10/23/15   HDR to vaginal cuff 30 Gy  . TMJ SYNDROME 11/12/2007    Past Surgical History:  Procedure Laterality Date  . 2008 TMJ surgery     . carpel tunnel Right 11/20/2016  . colonoscopy x 2    . cubital tunnel release Right 11/20/2016  . deviated septum surgery - 2007    . DILATION AND CURETTAGE OF UTERUS     2017  . endoscopy - 1990    . Mandib advancement forward  2009  . ROBOTIC ASSISTED TOTAL HYSTERECTOMY WITH BILATERAL SALPINGO OOPHERECTOMY Bilateral 08/14/2015   Procedure: XI ROBOTIC ASSISTED TOTAL HYSTERECTOMY WITH BILATERAL SALPINGO OOPHORECTOMY AND SENTAL LYMPH NODE BIOPSY;  Surgeon: Everitt Amber, MD;  Location: [facility];  Service: Gynecology;  Laterality: Bilateral;  . 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 Hypertension Father   . Heart disease Father 37       chf  . Heart disease Mother 29       CAD  . Hypertension Other   . Asthma Neg Hx     Social History   Tobacco Use  . Smoking status: Never Smoker  . Smokeless tobacco: Never Used  . Tobacco comment: Married, 1 child  Substance Use Topics  . Alcohol use: Yes    Alcohol/week: 2.0 standard drinks    Types: 1 Glasses of wine, 1 Cans of beer per week    Comment: daily    Subjective:  Patient presents with concerns for sinus infection; symptoms x 10+ days; +prone to allergic rhinitis/ had done allergy shots x 11 years; +right ear pain; fever at onset of symptoms; no wheezing/ no  shortness of breath;      Objective:  Vitals:   05/25/18 1044  BP: 118/80  Pulse: 70  Temp: 98.3 F (36.8 C)  TempSrc: Oral  SpO2: 98%  Weight: 139 lb 1.9 oz (63.1 kg)  Height: 5' 4.5" (1.638 m)    General: Well developed, well nourished, in no acute distress  Skin : Warm and dry.  Head: Normocephalic and atraumatic  Eyes: Sclera and conjunctiva clear; pupils round and reactive to light; extraocular movements intact  Ears: External  normal; canals clear; tympanic membranes normal  Oropharynx: Pink, supple. No suspicious lesions  Neck: Supple without thyromegaly, adenopathy  Lungs: Respirations unlabored; clear to auscultation bilaterally without wheeze, rales, rhonchi  CVS exam: normal rate and regular rhythm.  Neurologic: Alert and oriented; speech intact; face symmetrical; moves all extremities well; CNII-XII intact without focal deficit   Assessment:  1. Acute sinusitis, recurrence not specified, unspecified location     Plan:  Rx for Doxycycline 100 mg bid x 10 days, Prednisone 20 mg qd x 5 days; keep planned follow up with her allergist to discuss re-starting immunotherapy; increase fluids, rest and follow-up worse, no better.   No follow-ups on file.  No orders of the defined types were placed in this encounter.   Requested Prescriptions   Signed Prescriptions Disp Refills  . predniSONE (DELTASONE) 20 MG tablet 5 tablet 0    Sig: Take 1 tablet (20 mg total) by mouth daily with breakfast.  . doxycycline (VIBRA-TABS) 100 MG tablet 20 tablet 0    Sig: Take 1 tablet (100 mg total) by mouth 2 (two) times daily.

## 2018-06-01 DIAGNOSIS — J3089 Other allergic rhinitis: Secondary | ICD-10-CM | POA: Diagnosis not present

## 2018-06-01 DIAGNOSIS — J301 Allergic rhinitis due to pollen: Secondary | ICD-10-CM | POA: Diagnosis not present

## 2018-06-05 ENCOUNTER — Ambulatory Visit: Payer: Federal, State, Local not specified - PPO | Admitting: Family Medicine

## 2018-06-05 ENCOUNTER — Encounter: Payer: Self-pay | Admitting: Family Medicine

## 2018-06-05 DIAGNOSIS — B029 Zoster without complications: Secondary | ICD-10-CM | POA: Insufficient documentation

## 2018-06-05 MED ORDER — VALACYCLOVIR HCL 1 G PO TABS: 1000.0000 mg | ORAL_TABLET | Freq: Three times a day (TID) | ORAL | 0 refills | Status: AC

## 2018-06-05 NOTE — Assessment & Plan Note (Signed)
-  No rash yet but highly suspcious of shingles given history and description of pain -Will treat with valtrex tid x7 days -She will keep an eye out for rash development.

## 2018-06-05 NOTE — Patient Instructions (Signed)

## 2018-06-05 NOTE — Progress Notes (Signed)
[name] - 62 y.o. female MRN [id number]  Date of birth: October 26, 1955  Subjective Chief Complaint  Patient presents with  . Other    Burning sensation on right shoulder-ongoing for three days. Denies injury. Denies rash-or redness. Stretching worsens the pain. Thinks it may be shingles.     HPI [name] is a 62 y.o. female here today with complaint of burning pain just medial to R shoulder blade.  She report that symptoms began about 3 days ago.  Describes pain as a burning, stabbing pain.  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  She denies rash, fever, chills.  She has had a small amount of numbness over this area as well.   ROS:  A comprehensive ROS was completed and negative except as noted per HPI  Allergies  Allergen Reactions  . Astroglide [Gyne-Moistrin] Other (See Comments)    Itching and burning  . Colchicine     diarrhea  . Demerol Nausea And Vomiting  . Penicillins Hives and Other (See Comments)    Face turned red and had a headache. Has patient had a PCN reaction causing immediate rash, facial/tongue/throat swelling, SOB or lightheadedness with hypotension: no Has patient had a PCN reaction causing severe rash involving mucus membranes or skin necrosis: no Has patient had a PCN reaction that required hospitalization: no Has patient had a PCN reaction occurring within the last 10 years: no If all of the above answers are "NO", then may proceed with Cephalosporin use.  . Shellfish Allergy Other (See Comments)    Intolerance    Past Medical History:  Diagnosis Date  . ALLERGIC RHINITIS 03/02/2007  . Anemia   . ANXIETY 03/02/2007  . Arthritis   . ASTHMA 11/12/2007   Dr Orvil Feil  . Cancer (HCC)    skin, hx of  . Dysrhythmia    pt. states at night will notice a different heart rhythem  . Endometrial cancer ([place])   . FIBROCYSTIC BREAST DISEASE, HX OF  03/02/2007  . GERD 03/02/2007  . HYPOTHYROIDISM 11/12/2007   Dr Chalmers Cater  . Neuromuscular disorder (HCC)    carpel tunnel syndrome, compression of ulner nerve at elbows  . OSTEOPENIA 11/12/2007  . PEPTIC ULCER DISEASE 03/02/2007  . Pneumonia    hx. of  . Radiation 09/27/15-10/23/15   HDR to vaginal cuff 30 Gy  . Stress fracture   . TMJ SYNDROME 11/12/2007    Past Surgical History:  Procedure Laterality Date  . 2008 TMJ surgery     . carpel tunnel Right 11/20/2016  . colonoscopy x 2    . cubital tunnel release Right 11/20/2016  . deviated septum surgery - 2007    . DILATION AND CURETTAGE OF UTERUS     2017  . endoscopy - 1990    . Mandib advancement forward  2009  . ROBOTIC ASSISTED TOTAL HYSTERECTOMY WITH BILATERAL SALPINGO OOPHERECTOMY Bilateral 08/14/2015   Procedure: XI ROBOTIC ASSISTED TOTAL HYSTERECTOMY WITH BILATERAL SALPINGO OOPHORECTOMY AND SENTAL LYMPH NODE BIOPSY;  Surgeon: Everitt Amber, MD;  Location: [facility];  Service: Gynecology;  Laterality: Bilateral;  . 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 Marital status: Married    Spouse name: Not on file  . Number of children: Not on file  . Years of education: Not on file  .  Highest education level: Not on file  Occupational History  . Occupation: Community education officer at [employer]  . Financial resource strain: Not on file  . Food insecurity:    Worry: Not on file    Inability: Not on file  . Transportation needs:    Medical: Not on file    Non-medical: Not on file  Tobacco Use  . Smoking status: Never Smoker  . Smokeless tobacco: Never Used  . Tobacco comment: Married, 1 child  Substance and Sexual Activity  . Alcohol use: Yes    Alcohol/week: 2.0 standard drinks    Types: 1 Glasses of wine, 1 Cans of beer per week    Comment: daily  . Drug use: No  . Sexual activity: Yes  Lifestyle  . Physical activity:    Days per week: Not on file    Minutes  per session: Not on file  . Stress: Not on file  Relationships  . Social connections:    Talks on phone: Not on file    Gets together: Not on file    Attends religious service: Not on file    Active member of club or organization: Not on file    Attends meetings of clubs or organizations: Not on file    Relationship status: Not on file  Other Topics Concern  . Not on file  Social History Narrative  . Not on file    Family History  Problem Relation Age of Onset  . Hypertension Father   . Heart disease Father 81       chf  . Heart disease Mother 35       CAD  . Hypertension Other   . Asthma Neg Hx     Health Maintenance  Topic Date Due  . Hepatitis C Screening  1956/01/29  . HIV Screening  11/26/1971  . COLONOSCOPY  11/26/2006  . MAMMOGRAM  08/28/2016  . PAP SMEAR  01/19/2021  . TETANUS/TDAP  11/30/2024  . INFLUENZA VACCINE  Completed    ----------------------------------------------------------------------------------------------------------------------------------------------------------------------------------------------------------------- Physical Exam BP 122/62   Pulse 62   Temp 98.7 F (37.1 C) (Oral)   Ht 5' 4.5" (1.638 m)   Wt 140 lb (63.5 kg)   SpO2 98%   BMI 23.66 kg/m   Physical Exam  Constitutional: She is oriented to person, place, and time. She appears well-nourished. No distress.  HENT:  Head: Normocephalic and atraumatic.  Eyes: No scleral icterus.  Musculoskeletal: Normal range of motion. She exhibits no tenderness (No tenderness overlying muscles.  She has FROM of the shoulder and neck without pain. ).  Neurological: She is alert and oriented to person, place, and time.  Skin: Skin is warm and dry. No rash noted.  Psychiatric: She has a normal mood and affect. Her behavior is normal.     ------------------------------------------------------------------------------------------------------------------------------------------------------------------------------------------------------------------- Assessment and Plan  Shingles -No rash yet but highly suspcious of shingles given history and description of pain -Will treat with valtrex tid x7 days -She will keep an eye out for rash development.

## 2018-06-15 DIAGNOSIS — J3089 Other allergic rhinitis: Secondary | ICD-10-CM | POA: Diagnosis not present

## 2018-06-15 DIAGNOSIS — J301 Allergic rhinitis due to pollen: Secondary | ICD-10-CM | POA: Diagnosis not present

## 2018-06-18 DIAGNOSIS — L820 Inflamed seborrheic keratosis: Secondary | ICD-10-CM | POA: Diagnosis not present

## 2018-06-18 DIAGNOSIS — D2261 Melanocytic nevi of right upper limb, including shoulder: Secondary | ICD-10-CM | POA: Diagnosis not present

## 2018-06-18 DIAGNOSIS — Z85828 Personal history of other malignant neoplasm of skin: Secondary | ICD-10-CM | POA: Diagnosis not present

## 2018-06-18 DIAGNOSIS — D485 Neoplasm of uncertain behavior of skin: Secondary | ICD-10-CM | POA: Diagnosis not present

## 2018-06-18 DIAGNOSIS — L565 Disseminated superficial actinic porokeratosis (DSAP): Secondary | ICD-10-CM | POA: Diagnosis not present

## 2018-06-18 DIAGNOSIS — D2262 Melanocytic nevi of left upper limb, including shoulder: Secondary | ICD-10-CM | POA: Diagnosis not present

## 2018-06-18 DIAGNOSIS — D235 Other benign neoplasm of skin of trunk: Secondary | ICD-10-CM | POA: Diagnosis not present

## 2018-06-24 DIAGNOSIS — J3089 Other allergic rhinitis: Secondary | ICD-10-CM | POA: Diagnosis not present

## 2018-06-24 DIAGNOSIS — J301 Allergic rhinitis due to pollen: Secondary | ICD-10-CM | POA: Diagnosis not present

## 2018-06-29 DIAGNOSIS — J3089 Other allergic rhinitis: Secondary | ICD-10-CM | POA: Diagnosis not present

## 2018-06-29 DIAGNOSIS — J301 Allergic rhinitis due to pollen: Secondary | ICD-10-CM | POA: Diagnosis not present

## 2018-07-02 DIAGNOSIS — Z4802 Encounter for removal of sutures: Secondary | ICD-10-CM | POA: Diagnosis not present

## 2018-07-02 DIAGNOSIS — J3089 Other allergic rhinitis: Secondary | ICD-10-CM | POA: Diagnosis not present

## 2018-07-02 DIAGNOSIS — J301 Allergic rhinitis due to pollen: Secondary | ICD-10-CM | POA: Diagnosis not present

## 2018-07-06 DIAGNOSIS — J3089 Other allergic rhinitis: Secondary | ICD-10-CM | POA: Diagnosis not present

## 2018-07-06 DIAGNOSIS — J301 Allergic rhinitis due to pollen: Secondary | ICD-10-CM | POA: Diagnosis not present

## 2018-07-09 DIAGNOSIS — J0100 Acute maxillary sinusitis, unspecified: Secondary | ICD-10-CM | POA: Diagnosis not present

## 2018-07-09 DIAGNOSIS — J4520 Mild intermittent asthma, uncomplicated: Secondary | ICD-10-CM | POA: Diagnosis not present

## 2018-07-09 DIAGNOSIS — J3089 Other allergic rhinitis: Secondary | ICD-10-CM | POA: Diagnosis not present

## 2018-07-09 DIAGNOSIS — J301 Allergic rhinitis due to pollen: Secondary | ICD-10-CM | POA: Diagnosis not present

## 2018-07-13 DIAGNOSIS — J3089 Other allergic rhinitis: Secondary | ICD-10-CM | POA: Diagnosis not present

## 2018-07-13 DIAGNOSIS — J301 Allergic rhinitis due to pollen: Secondary | ICD-10-CM | POA: Diagnosis not present

## 2018-07-23 DIAGNOSIS — J3089 Other allergic rhinitis: Secondary | ICD-10-CM | POA: Diagnosis not present

## 2018-07-23 DIAGNOSIS — J301 Allergic rhinitis due to pollen: Secondary | ICD-10-CM | POA: Diagnosis not present

## 2018-07-23 DIAGNOSIS — J4520 Mild intermittent asthma, uncomplicated: Secondary | ICD-10-CM | POA: Diagnosis not present

## 2018-07-23 DIAGNOSIS — J3081 Allergic rhinitis due to animal (cat) (dog) hair and dander: Secondary | ICD-10-CM | POA: Diagnosis not present

## 2018-07-27 DIAGNOSIS — J3089 Other allergic rhinitis: Secondary | ICD-10-CM | POA: Diagnosis not present

## 2018-07-27 DIAGNOSIS — J301 Allergic rhinitis due to pollen: Secondary | ICD-10-CM | POA: Diagnosis not present

## 2018-07-30 DIAGNOSIS — J3089 Other allergic rhinitis: Secondary | ICD-10-CM | POA: Diagnosis not present

## 2018-07-30 DIAGNOSIS — J301 Allergic rhinitis due to pollen: Secondary | ICD-10-CM | POA: Diagnosis not present

## 2018-07-30 DIAGNOSIS — J3081 Allergic rhinitis due to animal (cat) (dog) hair and dander: Secondary | ICD-10-CM | POA: Diagnosis not present

## 2018-08-06 DIAGNOSIS — J3089 Other allergic rhinitis: Secondary | ICD-10-CM | POA: Diagnosis not present

## 2018-08-06 DIAGNOSIS — J301 Allergic rhinitis due to pollen: Secondary | ICD-10-CM | POA: Diagnosis not present

## 2018-08-10 DIAGNOSIS — M810 Age-related osteoporosis without current pathological fracture: Secondary | ICD-10-CM | POA: Diagnosis not present

## 2018-08-10 DIAGNOSIS — H00025 Hordeolum internum left lower eyelid: Secondary | ICD-10-CM | POA: Diagnosis not present

## 2018-08-10 DIAGNOSIS — E559 Vitamin D deficiency, unspecified: Secondary | ICD-10-CM | POA: Diagnosis not present

## 2018-08-10 DIAGNOSIS — E039 Hypothyroidism, unspecified: Secondary | ICD-10-CM | POA: Diagnosis not present

## 2018-08-13 DIAGNOSIS — J301 Allergic rhinitis due to pollen: Secondary | ICD-10-CM | POA: Diagnosis not present

## 2018-08-13 DIAGNOSIS — H00025 Hordeolum internum left lower eyelid: Secondary | ICD-10-CM | POA: Diagnosis not present

## 2018-08-13 DIAGNOSIS — J3089 Other allergic rhinitis: Secondary | ICD-10-CM | POA: Diagnosis not present

## 2018-08-17 DIAGNOSIS — E559 Vitamin D deficiency, unspecified: Secondary | ICD-10-CM | POA: Diagnosis not present

## 2018-08-17 DIAGNOSIS — M810 Age-related osteoporosis without current pathological fracture: Secondary | ICD-10-CM | POA: Diagnosis not present

## 2018-08-17 DIAGNOSIS — E039 Hypothyroidism, unspecified: Secondary | ICD-10-CM | POA: Diagnosis not present

## 2018-08-20 DIAGNOSIS — J301 Allergic rhinitis due to pollen: Secondary | ICD-10-CM | POA: Diagnosis not present

## 2018-08-20 DIAGNOSIS — J3089 Other allergic rhinitis: Secondary | ICD-10-CM | POA: Diagnosis not present

## 2018-08-24 DIAGNOSIS — J3089 Other allergic rhinitis: Secondary | ICD-10-CM | POA: Diagnosis not present

## 2018-08-24 DIAGNOSIS — J301 Allergic rhinitis due to pollen: Secondary | ICD-10-CM | POA: Diagnosis not present

## 2018-08-31 ENCOUNTER — Other Ambulatory Visit: Payer: Self-pay | Admitting: Gynecologic Oncology

## 2018-08-31 DIAGNOSIS — N949 Unspecified condition associated with female genital organs and menstrual cycle: Secondary | ICD-10-CM

## 2018-08-31 DIAGNOSIS — K080 Exfoliation of teeth due to systemic causes: Secondary | ICD-10-CM | POA: Diagnosis not present

## 2018-08-31 DIAGNOSIS — J3089 Other allergic rhinitis: Secondary | ICD-10-CM | POA: Diagnosis not present

## 2018-08-31 DIAGNOSIS — J301 Allergic rhinitis due to pollen: Secondary | ICD-10-CM | POA: Diagnosis not present

## 2018-09-07 DIAGNOSIS — J301 Allergic rhinitis due to pollen: Secondary | ICD-10-CM | POA: Diagnosis not present

## 2018-09-07 DIAGNOSIS — Z1231 Encounter for screening mammogram for malignant neoplasm of breast: Secondary | ICD-10-CM | POA: Diagnosis not present

## 2018-09-07 DIAGNOSIS — J3089 Other allergic rhinitis: Secondary | ICD-10-CM | POA: Diagnosis not present

## 2018-09-07 LAB — HM MAMMOGRAPHY

## 2018-09-10 DIAGNOSIS — M810 Age-related osteoporosis without current pathological fracture: Secondary | ICD-10-CM | POA: Diagnosis not present

## 2018-09-14 DIAGNOSIS — J301 Allergic rhinitis due to pollen: Secondary | ICD-10-CM | POA: Diagnosis not present

## 2018-09-14 DIAGNOSIS — J3089 Other allergic rhinitis: Secondary | ICD-10-CM | POA: Diagnosis not present

## 2018-09-16 ENCOUNTER — Encounter: Payer: Self-pay | Admitting: Internal Medicine

## 2018-09-16 NOTE — Progress Notes (Signed)
Outside notes received. Information abstracted. Notes sent to scan.  

## 2018-09-21 DIAGNOSIS — J301 Allergic rhinitis due to pollen: Secondary | ICD-10-CM | POA: Diagnosis not present

## 2018-09-21 DIAGNOSIS — J3089 Other allergic rhinitis: Secondary | ICD-10-CM | POA: Diagnosis not present

## 2018-09-28 DIAGNOSIS — J3089 Other allergic rhinitis: Secondary | ICD-10-CM | POA: Diagnosis not present

## 2018-09-28 DIAGNOSIS — J301 Allergic rhinitis due to pollen: Secondary | ICD-10-CM | POA: Diagnosis not present

## 2018-10-05 DIAGNOSIS — J3089 Other allergic rhinitis: Secondary | ICD-10-CM | POA: Diagnosis not present

## 2018-10-05 DIAGNOSIS — J301 Allergic rhinitis due to pollen: Secondary | ICD-10-CM | POA: Diagnosis not present

## 2018-10-12 DIAGNOSIS — J3089 Other allergic rhinitis: Secondary | ICD-10-CM | POA: Diagnosis not present

## 2018-10-12 DIAGNOSIS — J301 Allergic rhinitis due to pollen: Secondary | ICD-10-CM | POA: Diagnosis not present

## 2018-10-19 DIAGNOSIS — J301 Allergic rhinitis due to pollen: Secondary | ICD-10-CM | POA: Diagnosis not present

## 2018-10-19 DIAGNOSIS — J3089 Other allergic rhinitis: Secondary | ICD-10-CM | POA: Diagnosis not present

## 2018-10-26 DIAGNOSIS — J301 Allergic rhinitis due to pollen: Secondary | ICD-10-CM | POA: Diagnosis not present

## 2018-10-26 DIAGNOSIS — J3089 Other allergic rhinitis: Secondary | ICD-10-CM | POA: Diagnosis not present

## 2018-11-02 DIAGNOSIS — J3089 Other allergic rhinitis: Secondary | ICD-10-CM | POA: Diagnosis not present

## 2018-11-02 DIAGNOSIS — J301 Allergic rhinitis due to pollen: Secondary | ICD-10-CM | POA: Diagnosis not present

## 2018-11-09 DIAGNOSIS — J301 Allergic rhinitis due to pollen: Secondary | ICD-10-CM | POA: Diagnosis not present

## 2018-11-09 DIAGNOSIS — J3081 Allergic rhinitis due to animal (cat) (dog) hair and dander: Secondary | ICD-10-CM | POA: Diagnosis not present

## 2018-11-09 DIAGNOSIS — J3089 Other allergic rhinitis: Secondary | ICD-10-CM | POA: Diagnosis not present

## 2018-11-16 DIAGNOSIS — J301 Allergic rhinitis due to pollen: Secondary | ICD-10-CM | POA: Diagnosis not present

## 2018-11-16 DIAGNOSIS — J3081 Allergic rhinitis due to animal (cat) (dog) hair and dander: Secondary | ICD-10-CM | POA: Diagnosis not present

## 2018-11-16 DIAGNOSIS — J3089 Other allergic rhinitis: Secondary | ICD-10-CM | POA: Diagnosis not present

## 2018-11-19 DIAGNOSIS — M461 Sacroiliitis, not elsewhere classified: Secondary | ICD-10-CM | POA: Diagnosis not present

## 2018-11-19 DIAGNOSIS — R102 Pelvic and perineal pain: Secondary | ICD-10-CM | POA: Diagnosis not present

## 2018-11-23 DIAGNOSIS — J3089 Other allergic rhinitis: Secondary | ICD-10-CM | POA: Diagnosis not present

## 2018-11-23 DIAGNOSIS — J301 Allergic rhinitis due to pollen: Secondary | ICD-10-CM | POA: Diagnosis not present

## 2018-11-30 ENCOUNTER — Other Ambulatory Visit: Payer: Self-pay | Admitting: Gynecologic Oncology

## 2018-11-30 DIAGNOSIS — Z6823 Body mass index (BMI) 23.0-23.9, adult: Secondary | ICD-10-CM | POA: Diagnosis not present

## 2018-11-30 DIAGNOSIS — M461 Sacroiliitis, not elsewhere classified: Secondary | ICD-10-CM | POA: Diagnosis not present

## 2018-11-30 DIAGNOSIS — R102 Pelvic and perineal pain: Secondary | ICD-10-CM | POA: Diagnosis not present

## 2018-11-30 DIAGNOSIS — N949 Unspecified condition associated with female genital organs and menstrual cycle: Secondary | ICD-10-CM

## 2018-11-30 DIAGNOSIS — Z01419 Encounter for gynecological examination (general) (routine) without abnormal findings: Secondary | ICD-10-CM | POA: Diagnosis not present

## 2018-11-30 DIAGNOSIS — J3089 Other allergic rhinitis: Secondary | ICD-10-CM | POA: Diagnosis not present

## 2018-11-30 DIAGNOSIS — J301 Allergic rhinitis due to pollen: Secondary | ICD-10-CM | POA: Diagnosis not present

## 2018-11-30 MED ORDER — VENLAFAXINE HCL ER 37.5 MG PO CP24: ORAL_CAPSULE | ORAL | 6 refills | Status: DC

## 2018-11-30 NOTE — Progress Notes (Signed)
90 day supply sent in per pt request via fax from [facility]

## 2018-12-03 DIAGNOSIS — J3089 Other allergic rhinitis: Secondary | ICD-10-CM | POA: Diagnosis not present

## 2018-12-03 DIAGNOSIS — J301 Allergic rhinitis due to pollen: Secondary | ICD-10-CM | POA: Diagnosis not present

## 2018-12-07 DIAGNOSIS — J3089 Other allergic rhinitis: Secondary | ICD-10-CM | POA: Diagnosis not present

## 2018-12-07 DIAGNOSIS — J301 Allergic rhinitis due to pollen: Secondary | ICD-10-CM | POA: Diagnosis not present

## 2018-12-10 DIAGNOSIS — J301 Allergic rhinitis due to pollen: Secondary | ICD-10-CM | POA: Diagnosis not present

## 2018-12-10 DIAGNOSIS — J3089 Other allergic rhinitis: Secondary | ICD-10-CM | POA: Diagnosis not present

## 2018-12-14 DIAGNOSIS — M461 Sacroiliitis, not elsewhere classified: Secondary | ICD-10-CM | POA: Diagnosis not present

## 2018-12-14 DIAGNOSIS — R102 Pelvic and perineal pain: Secondary | ICD-10-CM | POA: Diagnosis not present

## 2018-12-14 DIAGNOSIS — J301 Allergic rhinitis due to pollen: Secondary | ICD-10-CM | POA: Diagnosis not present

## 2018-12-14 DIAGNOSIS — J3089 Other allergic rhinitis: Secondary | ICD-10-CM | POA: Diagnosis not present

## 2018-12-17 DIAGNOSIS — J301 Allergic rhinitis due to pollen: Secondary | ICD-10-CM | POA: Diagnosis not present

## 2018-12-17 DIAGNOSIS — J3089 Other allergic rhinitis: Secondary | ICD-10-CM | POA: Diagnosis not present

## 2018-12-24 DIAGNOSIS — J301 Allergic rhinitis due to pollen: Secondary | ICD-10-CM | POA: Diagnosis not present

## 2018-12-24 DIAGNOSIS — J3089 Other allergic rhinitis: Secondary | ICD-10-CM | POA: Diagnosis not present

## 2018-12-28 DIAGNOSIS — R102 Pelvic and perineal pain: Secondary | ICD-10-CM | POA: Diagnosis not present

## 2018-12-28 DIAGNOSIS — J301 Allergic rhinitis due to pollen: Secondary | ICD-10-CM | POA: Diagnosis not present

## 2018-12-28 DIAGNOSIS — J3089 Other allergic rhinitis: Secondary | ICD-10-CM | POA: Diagnosis not present

## 2018-12-28 DIAGNOSIS — M461 Sacroiliitis, not elsewhere classified: Secondary | ICD-10-CM | POA: Diagnosis not present

## 2018-12-30 DIAGNOSIS — M79642 Pain in left hand: Secondary | ICD-10-CM | POA: Insufficient documentation

## 2018-12-30 DIAGNOSIS — G5622 Lesion of ulnar nerve, left upper limb: Secondary | ICD-10-CM | POA: Diagnosis not present

## 2018-12-30 DIAGNOSIS — G5602 Carpal tunnel syndrome, left upper limb: Secondary | ICD-10-CM | POA: Diagnosis not present

## 2018-12-31 DIAGNOSIS — G5620 Lesion of ulnar nerve, unspecified upper limb: Secondary | ICD-10-CM | POA: Insufficient documentation

## 2018-12-31 DIAGNOSIS — G5602 Carpal tunnel syndrome, left upper limb: Secondary | ICD-10-CM | POA: Insufficient documentation

## 2019-01-04 DIAGNOSIS — J301 Allergic rhinitis due to pollen: Secondary | ICD-10-CM | POA: Diagnosis not present

## 2019-01-04 DIAGNOSIS — J3089 Other allergic rhinitis: Secondary | ICD-10-CM | POA: Diagnosis not present

## 2019-01-11 DIAGNOSIS — M461 Sacroiliitis, not elsewhere classified: Secondary | ICD-10-CM | POA: Diagnosis not present

## 2019-01-11 DIAGNOSIS — J3089 Other allergic rhinitis: Secondary | ICD-10-CM | POA: Diagnosis not present

## 2019-01-11 DIAGNOSIS — J301 Allergic rhinitis due to pollen: Secondary | ICD-10-CM | POA: Diagnosis not present

## 2019-01-11 DIAGNOSIS — R102 Pelvic and perineal pain: Secondary | ICD-10-CM | POA: Diagnosis not present

## 2019-01-18 DIAGNOSIS — J301 Allergic rhinitis due to pollen: Secondary | ICD-10-CM | POA: Diagnosis not present

## 2019-01-18 DIAGNOSIS — J3089 Other allergic rhinitis: Secondary | ICD-10-CM | POA: Diagnosis not present

## 2019-01-20 DIAGNOSIS — J3089 Other allergic rhinitis: Secondary | ICD-10-CM | POA: Diagnosis not present

## 2019-01-25 DIAGNOSIS — M461 Sacroiliitis, not elsewhere classified: Secondary | ICD-10-CM | POA: Diagnosis not present

## 2019-01-25 DIAGNOSIS — J3089 Other allergic rhinitis: Secondary | ICD-10-CM | POA: Diagnosis not present

## 2019-01-25 DIAGNOSIS — J301 Allergic rhinitis due to pollen: Secondary | ICD-10-CM | POA: Diagnosis not present

## 2019-01-25 DIAGNOSIS — R102 Pelvic and perineal pain: Secondary | ICD-10-CM | POA: Diagnosis not present

## 2019-02-01 DIAGNOSIS — J301 Allergic rhinitis due to pollen: Secondary | ICD-10-CM | POA: Diagnosis not present

## 2019-02-01 DIAGNOSIS — J3089 Other allergic rhinitis: Secondary | ICD-10-CM | POA: Diagnosis not present

## 2019-02-08 DIAGNOSIS — J3089 Other allergic rhinitis: Secondary | ICD-10-CM | POA: Diagnosis not present

## 2019-02-08 DIAGNOSIS — J301 Allergic rhinitis due to pollen: Secondary | ICD-10-CM | POA: Diagnosis not present

## 2019-02-16 DIAGNOSIS — Z85828 Personal history of other malignant neoplasm of skin: Secondary | ICD-10-CM | POA: Diagnosis not present

## 2019-02-16 DIAGNOSIS — L237 Allergic contact dermatitis due to plants, except food: Secondary | ICD-10-CM | POA: Diagnosis not present

## 2019-02-18 DIAGNOSIS — M461 Sacroiliitis, not elsewhere classified: Secondary | ICD-10-CM | POA: Diagnosis not present

## 2019-02-18 DIAGNOSIS — R102 Pelvic and perineal pain: Secondary | ICD-10-CM | POA: Diagnosis not present

## 2019-03-03 ENCOUNTER — Other Ambulatory Visit: Payer: Self-pay | Admitting: Gynecologic Oncology

## 2019-03-03 DIAGNOSIS — J3081 Allergic rhinitis due to animal (cat) (dog) hair and dander: Secondary | ICD-10-CM | POA: Diagnosis not present

## 2019-03-03 DIAGNOSIS — J3089 Other allergic rhinitis: Secondary | ICD-10-CM | POA: Diagnosis not present

## 2019-03-03 DIAGNOSIS — N949 Unspecified condition associated with female genital organs and menstrual cycle: Secondary | ICD-10-CM

## 2019-03-03 DIAGNOSIS — L237 Allergic contact dermatitis due to plants, except food: Secondary | ICD-10-CM | POA: Diagnosis not present

## 2019-03-03 DIAGNOSIS — J301 Allergic rhinitis due to pollen: Secondary | ICD-10-CM | POA: Diagnosis not present

## 2019-03-08 DIAGNOSIS — J3089 Other allergic rhinitis: Secondary | ICD-10-CM | POA: Diagnosis not present

## 2019-03-08 DIAGNOSIS — K080 Exfoliation of teeth due to systemic causes: Secondary | ICD-10-CM | POA: Diagnosis not present

## 2019-03-08 DIAGNOSIS — J301 Allergic rhinitis due to pollen: Secondary | ICD-10-CM | POA: Diagnosis not present

## 2019-03-15 DIAGNOSIS — J3089 Other allergic rhinitis: Secondary | ICD-10-CM | POA: Diagnosis not present

## 2019-03-15 DIAGNOSIS — J301 Allergic rhinitis due to pollen: Secondary | ICD-10-CM | POA: Diagnosis not present

## 2019-03-22 DIAGNOSIS — J3089 Other allergic rhinitis: Secondary | ICD-10-CM | POA: Diagnosis not present

## 2019-03-22 DIAGNOSIS — J301 Allergic rhinitis due to pollen: Secondary | ICD-10-CM | POA: Diagnosis not present

## 2019-03-25 DIAGNOSIS — J301 Allergic rhinitis due to pollen: Secondary | ICD-10-CM | POA: Diagnosis not present

## 2019-03-25 DIAGNOSIS — J3089 Other allergic rhinitis: Secondary | ICD-10-CM | POA: Diagnosis not present

## 2019-03-29 DIAGNOSIS — J3089 Other allergic rhinitis: Secondary | ICD-10-CM | POA: Diagnosis not present

## 2019-03-29 DIAGNOSIS — J301 Allergic rhinitis due to pollen: Secondary | ICD-10-CM | POA: Diagnosis not present

## 2019-04-05 DIAGNOSIS — J301 Allergic rhinitis due to pollen: Secondary | ICD-10-CM | POA: Diagnosis not present

## 2019-04-05 DIAGNOSIS — J3089 Other allergic rhinitis: Secondary | ICD-10-CM | POA: Diagnosis not present

## 2019-04-12 DIAGNOSIS — J301 Allergic rhinitis due to pollen: Secondary | ICD-10-CM | POA: Diagnosis not present

## 2019-04-12 DIAGNOSIS — R102 Pelvic and perineal pain: Secondary | ICD-10-CM | POA: Diagnosis not present

## 2019-04-12 DIAGNOSIS — M461 Sacroiliitis, not elsewhere classified: Secondary | ICD-10-CM | POA: Diagnosis not present

## 2019-04-12 DIAGNOSIS — J3089 Other allergic rhinitis: Secondary | ICD-10-CM | POA: Diagnosis not present

## 2019-04-17 DIAGNOSIS — Z20828 Contact with and (suspected) exposure to other viral communicable diseases: Secondary | ICD-10-CM | POA: Diagnosis not present

## 2019-04-19 DIAGNOSIS — J301 Allergic rhinitis due to pollen: Secondary | ICD-10-CM | POA: Diagnosis not present

## 2019-04-19 DIAGNOSIS — J3089 Other allergic rhinitis: Secondary | ICD-10-CM | POA: Diagnosis not present

## 2019-05-03 DIAGNOSIS — R102 Pelvic and perineal pain: Secondary | ICD-10-CM | POA: Diagnosis not present

## 2019-05-03 DIAGNOSIS — M461 Sacroiliitis, not elsewhere classified: Secondary | ICD-10-CM | POA: Diagnosis not present

## 2019-05-06 DIAGNOSIS — J3089 Other allergic rhinitis: Secondary | ICD-10-CM | POA: Diagnosis not present

## 2019-05-06 DIAGNOSIS — J301 Allergic rhinitis due to pollen: Secondary | ICD-10-CM | POA: Diagnosis not present

## 2019-05-10 DIAGNOSIS — J3089 Other allergic rhinitis: Secondary | ICD-10-CM | POA: Diagnosis not present

## 2019-05-10 DIAGNOSIS — J301 Allergic rhinitis due to pollen: Secondary | ICD-10-CM | POA: Diagnosis not present

## 2019-05-17 DIAGNOSIS — R102 Pelvic and perineal pain: Secondary | ICD-10-CM | POA: Diagnosis not present

## 2019-05-17 DIAGNOSIS — J301 Allergic rhinitis due to pollen: Secondary | ICD-10-CM | POA: Diagnosis not present

## 2019-05-17 DIAGNOSIS — J3089 Other allergic rhinitis: Secondary | ICD-10-CM | POA: Diagnosis not present

## 2019-05-17 DIAGNOSIS — M461 Sacroiliitis, not elsewhere classified: Secondary | ICD-10-CM | POA: Diagnosis not present

## 2019-05-24 ENCOUNTER — Inpatient Hospital Stay: Payer: Federal, State, Local not specified - PPO | Attending: Gynecologic Oncology | Admitting: Gynecologic Oncology

## 2019-05-24 ENCOUNTER — Other Ambulatory Visit: Payer: Self-pay

## 2019-05-24 VITALS — BP 113/56 | HR 72 | Temp 98.0°F | Resp 20 | Ht 64.5 in | Wt 143.0 lb

## 2019-05-24 DIAGNOSIS — E039 Hypothyroidism, unspecified: Secondary | ICD-10-CM | POA: Diagnosis not present

## 2019-05-24 DIAGNOSIS — Z79899 Other long term (current) drug therapy: Secondary | ICD-10-CM | POA: Diagnosis not present

## 2019-05-24 DIAGNOSIS — M1990 Unspecified osteoarthritis, unspecified site: Secondary | ICD-10-CM | POA: Insufficient documentation

## 2019-05-24 DIAGNOSIS — K219 Gastro-esophageal reflux disease without esophagitis: Secondary | ICD-10-CM | POA: Insufficient documentation

## 2019-05-24 DIAGNOSIS — J301 Allergic rhinitis due to pollen: Secondary | ICD-10-CM | POA: Diagnosis not present

## 2019-05-24 DIAGNOSIS — Z923 Personal history of irradiation: Secondary | ICD-10-CM | POA: Diagnosis not present

## 2019-05-24 DIAGNOSIS — C541 Malignant neoplasm of endometrium: Secondary | ICD-10-CM | POA: Insufficient documentation

## 2019-05-24 DIAGNOSIS — J3089 Other allergic rhinitis: Secondary | ICD-10-CM | POA: Diagnosis not present

## 2019-05-24 DIAGNOSIS — Z90722 Acquired absence of ovaries, bilateral: Secondary | ICD-10-CM | POA: Diagnosis not present

## 2019-05-24 DIAGNOSIS — M8580 Other specified disorders of bone density and structure, unspecified site: Secondary | ICD-10-CM | POA: Insufficient documentation

## 2019-05-24 DIAGNOSIS — N952 Postmenopausal atrophic vaginitis: Secondary | ICD-10-CM | POA: Diagnosis not present

## 2019-05-24 DIAGNOSIS — Z90710 Acquired absence of both cervix and uterus: Secondary | ICD-10-CM

## 2019-05-24 DIAGNOSIS — F419 Anxiety disorder, unspecified: Secondary | ICD-10-CM | POA: Insufficient documentation

## 2019-05-24 DIAGNOSIS — R232 Flushing: Secondary | ICD-10-CM | POA: Diagnosis not present

## 2019-05-24 NOTE — Progress Notes (Signed)
FOLLOW-UP ENDOMETRIAL CANCER  Assessment and Plan:    63 y.o. year old with Stage IA Grade 2 endometrioid endometrial cancer.   S/p robotic total hysterectomy, BSO, sentinel lymph node biopsy on 08/14/15. Positive LVSI, 12% myometrial invasion, questionably positive pelvic washings (atypical cells) and negative lymph nodes. Adjuvant vaginal radiation completed in April, 2017.   1/ history of endometrial cancer: No sign of recurrence Discussed signs and symptoms of recurrence including vaginal bleeding or discharge, leg pain or swelling and changes in bowel or bladder habits. She was given the opportunity to ask questions, which were answered to her satisfaction, and she is agreement with the above mentioned plan of care. She will eturn to see me in July 2020. She will see Dr Helane Rima in January, 2020.  2/ Genital atrophy of menopause: continue vaginal estrace three times weekly.   3/ Hot flashes: continue effexor.   HPI:  [name] is a 63 y.o. year old G0 initially seen in consultation in February 2016 referred by Dr Helane Rima for grade 2 endometrial cancer. Her only risk factors for this are the exogenous compounded hormones that she took for menopause and her history of nulliparity.  She then underwent a robotic total hysterectomy on [id number] without complications.  Her postoperative course was uncomplicated.  Her final pathologic diagnosis is a Stage IA Grade 2 endometrioid endometrial cancer with positive lymphovascular space invasion, 2/18 mm (12%) of myometrial invasion and negative lymph nodes. Washings showed atypical cells but these were inconclusive for being malignant.  Due to high/intermediate risk factors she received vaginal brachytherapy (30 Gy in 5 fractions) between 09/27/15 to 10/23/15.  Interval Hx: She is seen today for a routine surveillance visit. She continues to do pelvic PT for incontinence. 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  Past Medical History:   Diagnosis Date  . ALLERGIC RHINITIS 03/02/2007  . Anemia   . ANXIETY 03/02/2007  . Arthritis   . ASTHMA 11/12/2007   Dr Orvil Feil  . Cancer (HCC)    skin, hx of  . Dysrhythmia    pt. states at night will notice a different heart rhythem  . Endometrial cancer ([place])   . FIBROCYSTIC BREAST DISEASE, HX OF 03/02/2007  . GERD 03/02/2007  . HYPOTHYROIDISM 11/12/2007   Dr Chalmers Cater  . Neuromuscular disorder (HCC)    carpel tunnel syndrome, compression of ulner nerve at elbows  . OSTEOPENIA 11/12/2007  . PEPTIC ULCER DISEASE 03/02/2007  . Pneumonia    hx. of  . Radiation 09/27/15-10/23/15   HDR to vaginal cuff 30 Gy  . Stress fracture   . TMJ SYNDROME 11/12/2007   Past Surgical History:  Procedure Laterality Date  . 2008 TMJ surgery     . carpel tunnel Right 11/20/2016  . colonoscopy x 2    . cubital tunnel release Right 11/20/2016  . deviated septum surgery - 2007    . DILATION AND CURETTAGE OF UTERUS     2017  . endoscopy - 1990    . Mandib advancement forward  2009  . ROBOTIC ASSISTED TOTAL HYSTERECTOMY WITH BILATERAL SALPINGO OOPHERECTOMY Bilateral 08/14/2015   Procedure: XI ROBOTIC ASSISTED TOTAL HYSTERECTOMY WITH BILATERAL SALPINGO OOPHORECTOMY AND SENTAL LYMPH NODE BIOPSY;  Surgeon: Everitt Amber, MD;  Location: [facility];  Service: Gynecology;  Laterality: Bilateral;  . 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 Hypertension Father   . Heart disease  Father 39       chf  . Heart disease Mother 5       CAD  . Hypertension Other   . Asthma Neg Hx    Social History   Socioeconomic History  . Marital status: Married    Spouse name: Not on file  . Number of children: Not on file  . Years of education: Not on file  . Highest education level: Not on file  Occupational History  . Occupation: Community education officer at [employer]  . Financial resource strain: Not on file  . Food insecurity    Worry: Not on file     Inability: Not on file  . Transportation needs    Medical: Not on file    Non-medical: Not on file  Tobacco Use  . Smoking status: Never Smoker  . Smokeless tobacco: Never Used  . Tobacco comment: Married, 1 child  Substance and Sexual Activity  . Alcohol use: Yes    Alcohol/week: 2.0 standard drinks    Types: 1 Glasses of wine, 1 Cans of beer per week    Comment: daily  . Drug use: No  . Sexual activity: Yes  Lifestyle  . Physical activity    Days per week: Not on file    Minutes per session: Not on file  . Stress: Not on file  Relationships  . Social Herbalist on phone: Not on file    Gets together: Not on file    Attends religious service: Not on file    Active member of club or organization: Not on file    Attends meetings of clubs or organizations: Not on file    Relationship status: Not on file  . Intimate partner violence    Fear of current or ex partner: Not on file    Emotionally abused: Not on file    Physically abused: Not on file    Forced sexual activity: Not on file  Other Topics Concern  . Not on file  Social History Narrative  . Not on file   Current Outpatient Medications on File Prior to Visit  Medication Sig Dispense Refill  . Calcium Citrate-Vitamin D (CALCIUM CITRATE +D PO) Take 2 tablets by mouth daily.     . cetirizine (ZYRTEC) 5 MG tablet Take 5 mg by mouth 2 (two) times daily as needed for allergies.    . cholecalciferol (VITAMIN D) 1000 units tablet Take 2,000 Units by mouth daily.    Marland Kitchen EPIPEN 2-PAK 0.3 MG/0.3ML DEVI Inject 0.3 mg into the muscle once. Reported on 09/03/2015    . estradiol (ESTRACE) 0.1 MG/GM vaginal cream PLACE 1 APPLICATORFUL VAGINALLY 3 (THREE) TIMES A WEEK 42.5 g 12  . famotidine (PEPCID) 20 MG tablet Take 1 tablet (20 mg total) by mouth daily as needed for heartburn or indigestion. Take with Meloxicam 30 tablet 2  . fluticasone (FLONASE) 50 MCG/ACT nasal spray Place 1 spray into the nose daily as needed for  allergies. Reported on 08/06/2015    . levothyroxine (SYNTHROID, LEVOTHROID) 25 MCG tablet Take 25-50 mcg by mouth daily. She takes one tablet daily 4 days per week (Monday-Thursday) and two tablets daily 3 days per week (Friday-Sunday).    Marland Kitchen liothyronine (CYTOMEL) 5 MCG tablet Take 5-10 mcg by mouth 2 (two) times daily. She takes one tablet in the morning and two tablets midday.    Marland Kitchen MELATONIN-CHAMOMILE PO Take 1 capsule by mouth at bedtime as needed and may  repeat dose one time if needed (For sleep.).     Marland Kitchen METRONIDAZOLE, TOPICAL, 0.75 % LOTN Apply 1 application topically at bedtime.    . minoxidil (ROGAINE) 2 % external solution Apply 1 application topically 4 (four) times a week.     . Multiple Vitamin (MULTIVITAMIN WITH MINERALS) TABS tablet Take 1 tablet by mouth daily.    . Omega-3 Fatty Acids (FISH OIL) 1000 MG CAPS Take 1 capsule by mouth daily.    Marland Kitchen pyridoxine (B-6) 200 MG tablet Take 200 mg by mouth daily.    . TURMERIC PO Take 1 capsule by mouth daily.    Marland Kitchen venlafaxine XR (EFFEXOR-XR) 37.5 MG 24 hr capsule TAKE 1 CAPSULE BY MOUTH EVERY DAY WITH BREAKFAST 90 capsule 6  . vitamin B-12 (CYANOCOBALAMIN) 1000 MCG tablet Take 1,000 mcg by mouth daily.    . vitamin C (ASCORBIC ACID) 500 MG tablet Take 500 mg by mouth daily.    . vitamin E 400 UNIT capsule Take 400 Units by mouth daily.    . zoledronic acid (RECLAST) 5 MG/100ML SOLN injection Inject 5 mg into the vein. Once a year    . [DISCONTINUED] escitalopram (LEXAPRO) 10 MG tablet Take 10 mg by mouth daily.       No current facility-administered medications on file prior to visit.    Allergies  Allergen Reactions  . Astroglide [Gyne-Moistrin] Other (See Comments)    Itching and burning  . Colchicine     diarrhea  . Demerol Nausea And Vomiting  . Penicillins Hives and Other (See Comments)    Face turned red and had a headache. Has patient had a PCN reaction causing immediate rash, facial/tongue/throat swelling, SOB or  lightheadedness with hypotension: no Has patient had a PCN reaction causing severe rash involving mucus membranes or skin necrosis: no Has patient had a PCN reaction that required hospitalization: no Has patient had a PCN reaction occurring within the last 10 years: no If all of the above answers are "NO", then may proceed with Cephalosporin use.  . Shellfish Allergy Other (See Comments)    Intolerance     Review of systems: Constitutional:  She has no weight gain or weight loss. She has no fever or chills. Eyes: No blurred vision Ears, Nose, Mouth, Throat: No dizziness, headaches or changes in hearing. No mouth sores. Cardiovascular: No chest pain, palpitations or edema. Respiratory:  No shortness of breath, wheezing or cough Gastrointestinal: She denies diarrhea. + constipation. She denies any nausea or vomiting. She denies blood in her stool or heart burn. Genitourinary:  She denies pelvic pain, pelvic pressure or changes in her urinary function. She has no hematuria, dysuria. + incontinence. She has no irregular vaginal bleeding. +vaginal discharge + genital atrophy Musculoskeletal: Denies muscle weakness or joint pains.  Skin:  She has no skin changes, rashes or itching Neurological:  Denies dizziness or headaches. No neuropathy, no numbness or tingling. Psychiatric:  She denies depression or anxiety. Hematologic/Lymphatic:   No easy bruising or bleeding   Physical Exam: Blood pressure (!) 113/56, pulse 72, temperature 98 F (36.7 C), temperature source Temporal, resp. rate 20, height 5' 4.5" (1.638 m), weight 143 lb (64.9 kg), SpO2 98 %. General: Well dressed, well nourished in no apparent distress.   HEENT:  Normocephalic and atraumatic, no lesions.  Extraocular muscles intact. Sclerae anicteric. Pupils equal, round, reactive. No mouth sores or ulcers. Thyroid is normal size, not nodular, midline. Abdomen:  Soft, nontender, nondistended.  No palpable masses.  No  hepatosplenomegaly.  No ascites. Normal bowel sounds.  No hernias.  Incisions are well healed. Genitourinary: Normal EGBUS  Vaginal cuff intact.  Atrophy with decreased lubrication and rugation. No bleeding.  No cul de sac fullness. Extremities: No cyanosis, clubbing or edema.  No calf tenderness or erythema. No palpable cords. Psychiatric: Mood and affect are appropriate. Neurological: Awake, alert and oriented x 3. Sensation is intact, no neuropathy.  Musculoskeletal: No pain, normal strength and range of motion.   Thereasa Solo, MD

## 2019-05-24 NOTE — Patient Instructions (Signed)
Please notify Dr Denman George at phone number [phone] if you notice vaginal bleeding, new pelvic or abdominal pains, bloating, feeling full easy, or a change in bladder or bowel function.   Continue to use effexor and vaginal estrace.  Please contact Dr Serita Grit office (at [phone]) in July, 2021 to request an appointment with her for November, 2021.

## 2019-06-03 DIAGNOSIS — J3089 Other allergic rhinitis: Secondary | ICD-10-CM | POA: Diagnosis not present

## 2019-06-03 DIAGNOSIS — J301 Allergic rhinitis due to pollen: Secondary | ICD-10-CM | POA: Diagnosis not present

## 2019-06-14 DIAGNOSIS — J301 Allergic rhinitis due to pollen: Secondary | ICD-10-CM | POA: Diagnosis not present

## 2019-06-14 DIAGNOSIS — J3089 Other allergic rhinitis: Secondary | ICD-10-CM | POA: Diagnosis not present

## 2019-06-16 DIAGNOSIS — J301 Allergic rhinitis due to pollen: Secondary | ICD-10-CM | POA: Diagnosis not present

## 2019-06-21 DIAGNOSIS — J3089 Other allergic rhinitis: Secondary | ICD-10-CM | POA: Diagnosis not present

## 2019-06-21 DIAGNOSIS — J301 Allergic rhinitis due to pollen: Secondary | ICD-10-CM | POA: Diagnosis not present

## 2019-06-28 DIAGNOSIS — M461 Sacroiliitis, not elsewhere classified: Secondary | ICD-10-CM | POA: Diagnosis not present

## 2019-06-28 DIAGNOSIS — R102 Pelvic and perineal pain: Secondary | ICD-10-CM | POA: Diagnosis not present

## 2019-07-01 HISTORY — PX: CARPAL TUNNEL WITH CUBITAL TUNNEL: SHX5608

## 2019-07-05 ENCOUNTER — Ambulatory Visit (INDEPENDENT_AMBULATORY_CARE_PROVIDER_SITE_OTHER): Payer: Federal, State, Local not specified - PPO | Admitting: Internal Medicine

## 2019-07-05 ENCOUNTER — Other Ambulatory Visit: Payer: Self-pay

## 2019-07-05 ENCOUNTER — Encounter: Payer: Self-pay | Admitting: Internal Medicine

## 2019-07-05 VITALS — BP 116/60 | HR 66 | Temp 98.4°F | Ht 64.5 in | Wt 135.0 lb

## 2019-07-05 DIAGNOSIS — D6489 Other specified anemias: Secondary | ICD-10-CM

## 2019-07-05 DIAGNOSIS — M810 Age-related osteoporosis without current pathological fracture: Secondary | ICD-10-CM

## 2019-07-05 DIAGNOSIS — Z0000 Encounter for general adult medical examination without abnormal findings: Secondary | ICD-10-CM

## 2019-07-05 DIAGNOSIS — C541 Malignant neoplasm of endometrium: Secondary | ICD-10-CM

## 2019-07-05 DIAGNOSIS — J3089 Other allergic rhinitis: Secondary | ICD-10-CM | POA: Diagnosis not present

## 2019-07-05 DIAGNOSIS — J301 Allergic rhinitis due to pollen: Secondary | ICD-10-CM | POA: Diagnosis not present

## 2019-07-05 NOTE — Assessment & Plan Note (Addendum)
We discussed age appropriate health related issues, including available/recomended screening tests and vaccinations. We discussed a need for adhering to healthy diet and exercise. Labs were ordered to be later reviewed . All questions were answered. Colon due 2024 Dr Earlean Shawl Shingrix later

## 2019-07-05 NOTE — Progress Notes (Signed)
Subjective:  Patient ID: [name], female    DOB: 1956-03-17  Age: 64 y.o. MRN: [id number]  CC: No chief complaint on file.   HPI [name] presents for a well exam  Outpatient Medications Prior to Visit  Medication Sig Dispense Refill  . Calcium Citrate-Vitamin D (CALCIUM CITRATE +D PO) Take 2 tablets by mouth daily.     . cetirizine (ZYRTEC) 5 MG tablet Take 5 mg by mouth 2 (two) times daily as needed for allergies.    . cholecalciferol (VITAMIN D) 1000 units tablet Take 2,000 Units by mouth daily.    Marland Kitchen co-enzyme Q-10 30 MG capsule Take 30 mg by mouth 3 (three) times daily.    Marland Kitchen EPIPEN 2-PAK 0.3 MG/0.3ML DEVI Inject 0.3 mg into the muscle once. Reported on 09/03/2015    . estradiol (ESTRACE) 0.1 MG/GM vaginal cream PLACE 1 APPLICATORFUL VAGINALLY 3 (THREE) TIMES A WEEK 42.5 g 12  . famotidine (PEPCID) 20 MG tablet Take 1 tablet (20 mg total) by mouth daily as needed for heartburn or indigestion. Take with Meloxicam 30 tablet 2  . fluticasone (FLONASE) 50 MCG/ACT nasal spray Place 1 spray into the nose daily as needed for allergies. Reported on 08/06/2015    . levothyroxine (SYNTHROID, LEVOTHROID) 25 MCG tablet Take 25-50 mcg by mouth daily. She takes one tablet daily 4 days per week (Monday-Thursday) and two tablets daily 3 days per week (Friday-Sunday).    Marland Kitchen liothyronine (CYTOMEL) 5 MCG tablet Take 5-10 mcg by mouth 2 (two) times daily. She takes one tablet in the morning and two tablets midday.    Marland Kitchen MELATONIN-CHAMOMILE PO Take 1 capsule by mouth at bedtime as needed and may repeat dose one time if needed (For sleep.).     Marland Kitchen METRONIDAZOLE, TOPICAL, 0.75 % LOTN Apply 1 application topically at bedtime.    . minoxidil (ROGAINE) 2 % external solution Apply 1 application topically 4 (four) times a week.     . Multiple Vitamin (MULTIVITAMIN WITH MINERALS) TABS tablet Take 1 tablet by mouth daily.    . Omega-3 Fatty Acids (FISH OIL) 1000 MG CAPS Take 1 capsule by mouth daily.    Marland Kitchen  pyridoxine (B-6) 200 MG tablet Take 200 mg by mouth daily.    . TURMERIC PO Take 1 capsule by mouth daily.    Marland Kitchen venlafaxine XR (EFFEXOR-XR) 37.5 MG 24 hr capsule TAKE 1 CAPSULE BY MOUTH EVERY DAY WITH BREAKFAST 90 capsule 6  . vitamin C (ASCORBIC ACID) 500 MG tablet Take 500 mg by mouth daily.    . vitamin E 400 UNIT capsule Take 400 Units by mouth daily.    . zoledronic acid (RECLAST) 5 MG/100ML SOLN injection Inject 5 mg into the vein. Once a year    . vitamin B-12 (CYANOCOBALAMIN) 1000 MCG tablet Take 1,000 mcg by mouth daily.     No facility-administered medications prior to visit.    ROS: Review of Systems  Constitutional: Positive for fatigue. Negative for activity change, appetite change, chills and unexpected weight change.  HENT: Negative for congestion, mouth sores and sinus pressure.   Eyes: Negative for visual disturbance.  Respiratory: Negative for cough and chest tightness.   Gastrointestinal: Negative for abdominal pain and nausea.  Genitourinary: Negative for difficulty urinating, frequency and vaginal pain.  Musculoskeletal: Negative for back pain and gait problem.  Skin: Negative for pallor and rash.  Neurological: Negative for dizziness, tremors, weakness, numbness and headaches.  Psychiatric/Behavioral: Negative for confusion, sleep disturbance  and suicidal ideas.    Objective:  BP 116/60 (BP Location: Left Arm, Patient Position: Sitting, Cuff Size: Normal)   Pulse 66   Temp 98.4 F (36.9 C) (Oral)   Ht 5' 4.5" (1.638 m)   Wt 135 lb (61.2 kg)   SpO2 97%   BMI 22.81 kg/m   BP Readings from Last 3 Encounters:  07/05/19 116/60  05/24/19 (!) 113/56  06/05/18 122/62    Wt Readings from Last 3 Encounters:  07/05/19 135 lb (61.2 kg)  05/24/19 143 lb (64.9 kg)  06/05/18 140 lb (63.5 kg)    Physical Exam Constitutional:      General: She is not in acute distress.    Appearance: She is well-developed.  HENT:     Head: Normocephalic.     Right Ear:  External ear normal.     Left Ear: External ear normal.     Nose: Nose normal.  Eyes:     General:        Right eye: No discharge.        Left eye: No discharge.     Conjunctiva/sclera: Conjunctivae normal.     Pupils: Pupils are equal, round, and reactive to light.  Neck:     Thyroid: No thyromegaly.     Vascular: No JVD.     Trachea: No tracheal deviation.  Cardiovascular:     Rate and Rhythm: Normal rate and regular rhythm.     Heart sounds: Normal heart sounds.  Pulmonary:     Effort: No respiratory distress.     Breath sounds: No stridor. No wheezing.  Abdominal:     General: Bowel sounds are normal. There is no distension.     Palpations: Abdomen is soft. There is no mass.     Tenderness: There is no abdominal tenderness. There is no guarding or rebound.  Musculoskeletal:        General: No tenderness.     Cervical back: Normal range of motion and neck supple.  Lymphadenopathy:     Cervical: No cervical adenopathy.  Skin:    Findings: No erythema or rash.  Neurological:     Cranial Nerves: No cranial nerve deficit.     Motor: No abnormal muscle tone.     Coordination: Coordination normal.     Deep Tendon Reflexes: Reflexes normal.  Psychiatric:        Behavior: Behavior normal.        Thought Content: Thought content normal.        Judgment: Judgment normal.     Lab Results  Component Value Date   WBC 3.8 (L) 05/13/2017   HGB 13.8 05/13/2017   HCT 40.1 05/13/2017   PLT 210.0 05/13/2017   GLUCOSE 90 05/13/2017   CHOL 221 (H) 05/13/2017   TRIG 119.0 05/13/2017   HDL 79.50 05/13/2017   LDLDIRECT 102.8 11/11/2010   LDLCALC 118 (H) 05/13/2017   ALT 15 05/13/2017   AST 20 05/13/2017   NA 136 05/13/2017   K 3.7 05/13/2017   CL 99 05/13/2017   CREATININE 0.73 05/13/2017   BUN 16 05/13/2017   CO2 30 05/13/2017   TSH 1.47 05/13/2017    No results found.  Assessment & Plan:   There are no diagnoses linked to this encounter.   No orders of the defined  types were placed in this encounter.    Follow-up: No follow-ups on file.  Walker Kehr, MD

## 2019-07-05 NOTE — Assessment & Plan Note (Signed)
Reclast IV since 2017 - Dr Chalmers Cater

## 2019-07-05 NOTE — Patient Instructions (Signed)

## 2019-07-05 NOTE — Assessment & Plan Note (Addendum)
Blood donor - stopped

## 2019-07-05 NOTE — Assessment & Plan Note (Signed)
F/u q 6 mo

## 2019-07-08 ENCOUNTER — Other Ambulatory Visit (INDEPENDENT_AMBULATORY_CARE_PROVIDER_SITE_OTHER): Payer: Federal, State, Local not specified - PPO

## 2019-07-08 ENCOUNTER — Other Ambulatory Visit: Payer: Self-pay

## 2019-07-08 DIAGNOSIS — Z0000 Encounter for general adult medical examination without abnormal findings: Secondary | ICD-10-CM

## 2019-07-08 LAB — LIPID PANEL
Cholesterol: 257 mg/dL — ABNORMAL HIGH (ref 0–200)
HDL: 86.5 mg/dL (ref >39.00)
LDL Cholesterol: 154 mg/dL — ABNORMAL HIGH (ref 0–99)
NonHDL: 170.24
Total CHOL/HDL Ratio: 3
Triglycerides: 81 mg/dL (ref 0.0–149.0)
VLDL: 16.2 mg/dL (ref 0.0–40.0)

## 2019-07-08 LAB — BASIC METABOLIC PANEL
BUN: 15 mg/dL (ref 6–23)
CO2: 31 mEq/L (ref 19–32)
Calcium: 9.3 mg/dL (ref 8.4–10.5)
Chloride: 102 mEq/L (ref 96–112)
Creatinine, Ser: 0.75 mg/dL (ref 0.40–1.20)
GFR: 78.14 mL/min (ref >60.00)
Glucose, Bld: 97 mg/dL (ref 70–99)
Potassium: 4 mEq/L (ref 3.5–5.1)
Sodium: 138 mEq/L (ref 135–145)

## 2019-07-08 LAB — HEPATIC FUNCTION PANEL
ALT: 19 U/L (ref 0–35)
AST: 23 U/L (ref 0–37)
Albumin: 4.3 g/dL (ref 3.5–5.2)
Alkaline Phosphatase: 70 U/L (ref 39–117)
Bilirubin, Direct: 0.1 mg/dL (ref 0.0–0.3)
Total Bilirubin: 0.6 mg/dL (ref 0.2–1.2)
Total Protein: 6.2 g/dL (ref 6.0–8.3)

## 2019-07-08 LAB — CBC WITH DIFFERENTIAL/PLATELET
Basophils Absolute: 0 10*3/uL (ref 0.0–0.1)
Basophils Relative: 1 % (ref 0.0–3.0)
Eosinophils Absolute: 0.1 10*3/uL (ref 0.0–0.7)
Eosinophils Relative: 3.6 % (ref 0.0–5.0)
HCT: 41.2 % (ref 36.0–46.0)
Hemoglobin: 13.8 g/dL (ref 12.0–15.0)
Lymphocytes Relative: 43.8 % (ref 12.0–46.0)
Lymphs Abs: 1.7 10*3/uL (ref 0.7–4.0)
MCHC: 33.5 g/dL (ref 30.0–36.0)
MCV: 93.3 fl (ref 78.0–100.0)
Monocytes Absolute: 0.3 10*3/uL (ref 0.1–1.0)
Monocytes Relative: 7.7 % (ref 3.0–12.0)
Neutro Abs: 1.7 10*3/uL (ref 1.4–7.7)
Neutrophils Relative %: 43.9 % (ref 43.0–77.0)
Platelets: 241 10*3/uL (ref 150.0–400.0)
RBC: 4.42 Mil/uL (ref 3.87–5.11)
RDW: 13.2 % (ref 11.5–15.5)
WBC: 3.8 10*3/uL — ABNORMAL LOW (ref 4.0–10.5)

## 2019-07-08 LAB — URINALYSIS
Bilirubin Urine: NEGATIVE
Hgb urine dipstick: NEGATIVE
Ketones, ur: NEGATIVE
Leukocytes,Ua: NEGATIVE
Nitrite: NEGATIVE
Specific Gravity, Urine: 1.02 (ref 1.000–1.030)
Total Protein, Urine: NEGATIVE
Urine Glucose: NEGATIVE
Urobilinogen, UA: 0.2 (ref 0.0–1.0)
pH: 7 (ref 5.0–8.0)

## 2019-07-12 DIAGNOSIS — J301 Allergic rhinitis due to pollen: Secondary | ICD-10-CM | POA: Diagnosis not present

## 2019-07-12 DIAGNOSIS — J4520 Mild intermittent asthma, uncomplicated: Secondary | ICD-10-CM | POA: Diagnosis not present

## 2019-07-12 DIAGNOSIS — J3081 Allergic rhinitis due to animal (cat) (dog) hair and dander: Secondary | ICD-10-CM | POA: Diagnosis not present

## 2019-07-12 DIAGNOSIS — J3089 Other allergic rhinitis: Secondary | ICD-10-CM | POA: Diagnosis not present

## 2019-07-15 DIAGNOSIS — M85851 Other specified disorders of bone density and structure, right thigh: Secondary | ICD-10-CM | POA: Diagnosis not present

## 2019-07-15 DIAGNOSIS — M85852 Other specified disorders of bone density and structure, left thigh: Secondary | ICD-10-CM | POA: Diagnosis not present

## 2019-07-15 DIAGNOSIS — E785 Hyperlipidemia, unspecified: Secondary | ICD-10-CM

## 2019-07-15 DIAGNOSIS — M810 Age-related osteoporosis without current pathological fracture: Secondary | ICD-10-CM | POA: Diagnosis not present

## 2019-07-15 LAB — HM DEXA SCAN

## 2019-07-15 NOTE — Telephone Encounter (Signed)
I have added the CT scan that I think you want. Not sure the dx code. Let me know and/or attach and sign.   Let me know.

## 2019-07-22 DIAGNOSIS — M461 Sacroiliitis, not elsewhere classified: Secondary | ICD-10-CM | POA: Diagnosis not present

## 2019-07-22 DIAGNOSIS — J3089 Other allergic rhinitis: Secondary | ICD-10-CM | POA: Diagnosis not present

## 2019-07-22 DIAGNOSIS — J301 Allergic rhinitis due to pollen: Secondary | ICD-10-CM | POA: Diagnosis not present

## 2019-07-22 DIAGNOSIS — R102 Pelvic and perineal pain: Secondary | ICD-10-CM | POA: Diagnosis not present

## 2019-07-26 DIAGNOSIS — G5622 Lesion of ulnar nerve, left upper limb: Secondary | ICD-10-CM | POA: Diagnosis not present

## 2019-07-26 DIAGNOSIS — G5602 Carpal tunnel syndrome, left upper limb: Secondary | ICD-10-CM | POA: Diagnosis not present

## 2019-07-28 ENCOUNTER — Ambulatory Visit: Payer: Federal, State, Local not specified - PPO

## 2019-08-02 ENCOUNTER — Ambulatory Visit: Payer: Federal, State, Local not specified - PPO

## 2019-08-05 ENCOUNTER — Ambulatory Visit: Payer: Federal, State, Local not specified - PPO

## 2019-08-07 ENCOUNTER — Ambulatory Visit: Payer: Federal, State, Local not specified - PPO

## 2019-08-19 ENCOUNTER — Other Ambulatory Visit: Payer: Federal, State, Local not specified - PPO

## 2019-08-19 ENCOUNTER — Inpatient Hospital Stay: Admission: RE | Admit: 2019-08-19 | Payer: Federal, State, Local not specified - PPO | Source: Ambulatory Visit

## 2019-08-29 ENCOUNTER — Other Ambulatory Visit: Payer: Self-pay

## 2019-08-29 ENCOUNTER — Ambulatory Visit (INDEPENDENT_AMBULATORY_CARE_PROVIDER_SITE_OTHER)
Admission: RE | Admit: 2019-08-29 | Discharge: 2019-08-29 | Disposition: A | Discharge status: Home / Self Care | Payer: Self-pay | Source: Ambulatory Visit | Attending: Internal Medicine | Admitting: Internal Medicine

## 2019-08-29 DIAGNOSIS — E785 Hyperlipidemia, unspecified: Secondary | ICD-10-CM

## 2019-08-29 IMAGING — CT CT CARDIAC CORONARY ARTERY CALCIUM SCORE
3 series · 14 of 20 positions shown, 15 images · non-contrast
Comparison: None.
COMPARISON: None.

Addendum:
EXAM:
OVER-READ INTERPRETATION  CT CHEST

The following report is an over-read performed by radiologist Dr.
HASHIMI [REDACTED] on [DATE]. This over-read
does not include interpretation of cardiac or coronary anatomy or
pathology. The coronary calcium score interpretation by the
cardiologist is attached.
CLINICAL DATA: Risk stratification
Coronary Calcium Score
TECHNIQUE: The patient was scanned on a Siemens Force scanner. Axial
non-contrast 3 mm slices were carried out through the heart. The
data set was analyzed on a dedicated work station and scored using
the Agatson method.

[Series 2: casc 3.0 bv41 2 bestdiast 69 % · axial · 0.30mm/px · z∈[+1189,+1270]mm · 4 of 45 slices shown, 5 images]
[im 9/45  vessel]
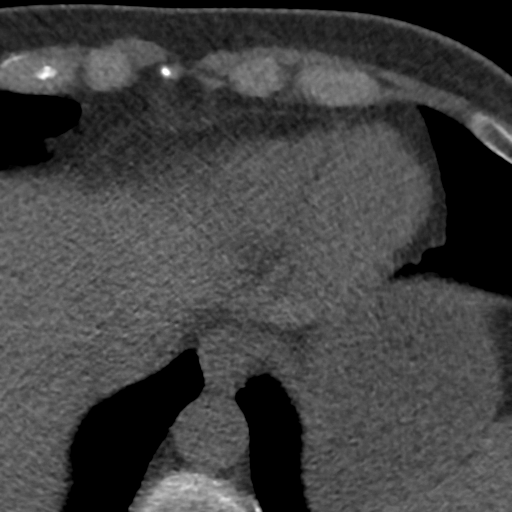
[im 9/45  lung]
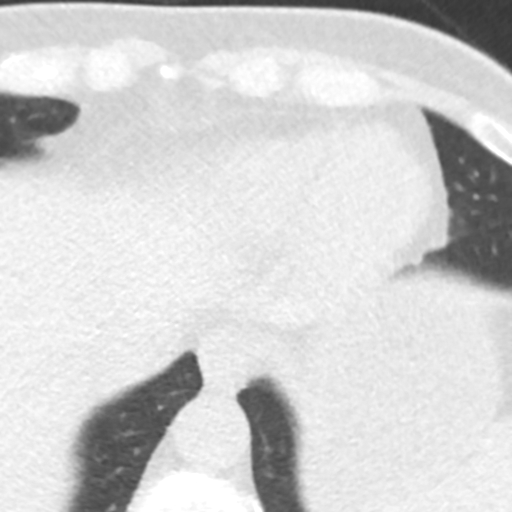
[im 18/45  vessel]
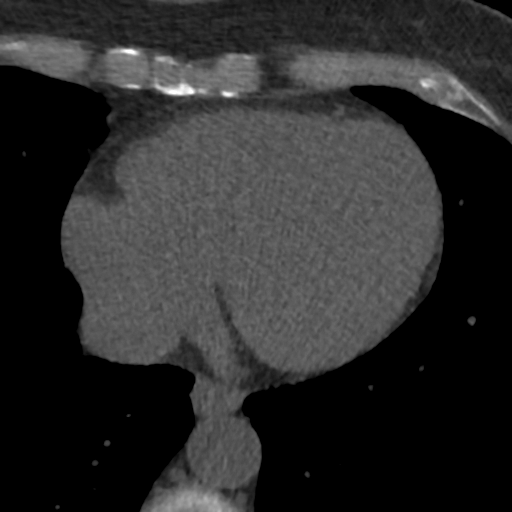
[im 27/45  vessel]
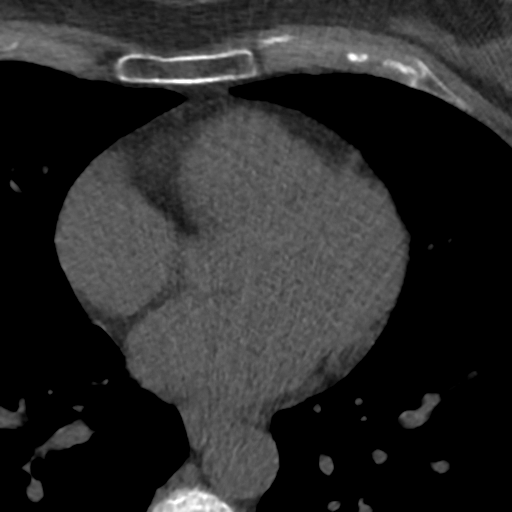
[im 36/45  vessel]
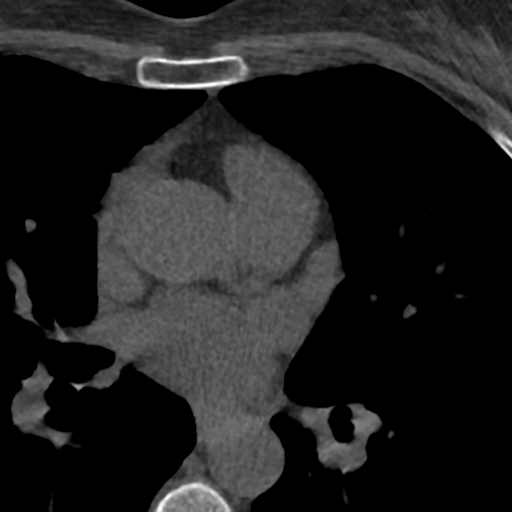

[Series 3: lung 68 % · axial · 0.57mm/px · z∈[+1186,+1273]mm · 5 of 45 slices shown]
[im 8/45  lung]
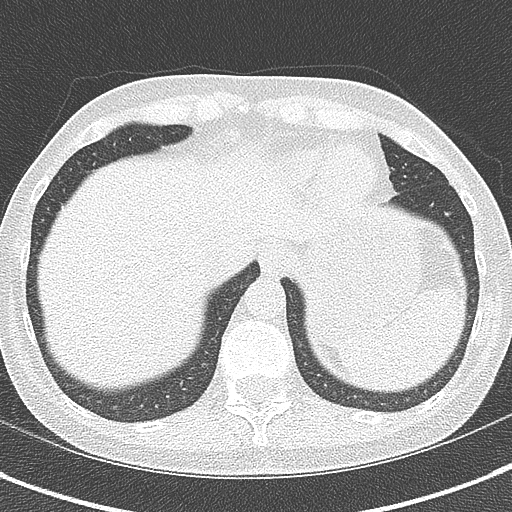
[im 15/45  lung]
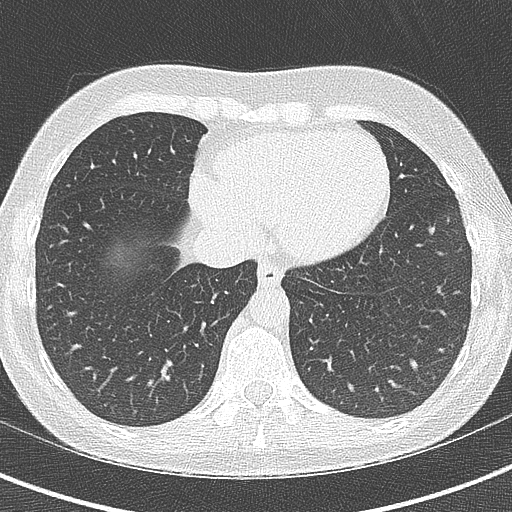
[im 23/45  lung]
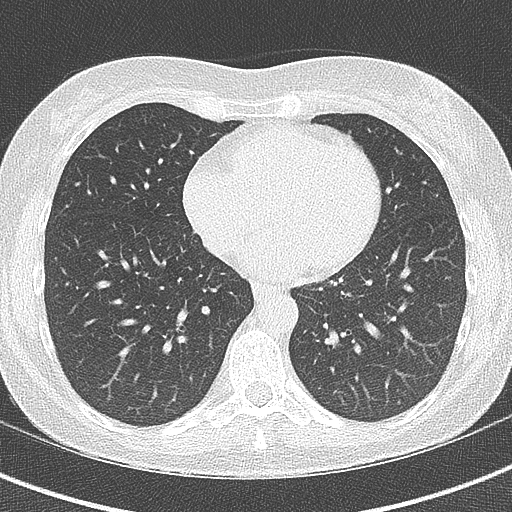
[im 30/45  lung]
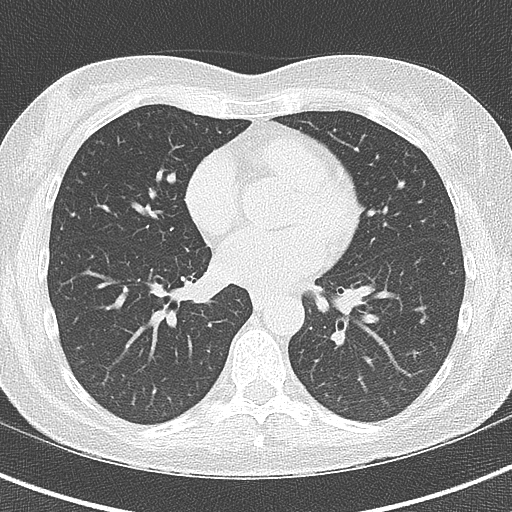
[im 37/45  lung]
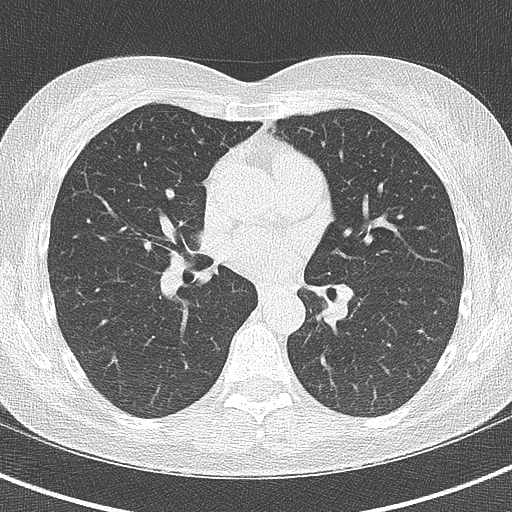

[Series 4: lung st 68 % · axial · 0.57mm/px · z∈[+1186,+1273]mm · 5 of 45 slices shown]
[im 8/45  lung]
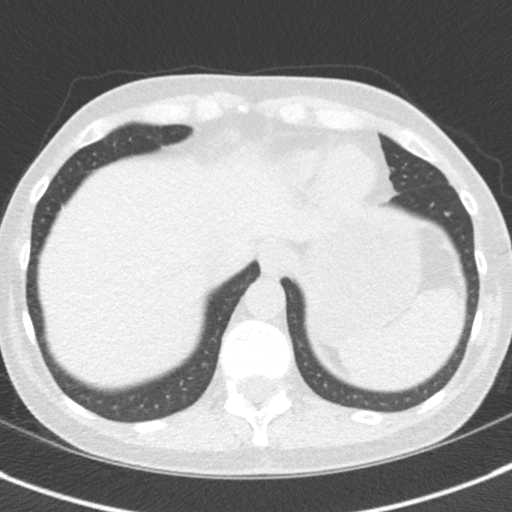
[im 15/45  lung]
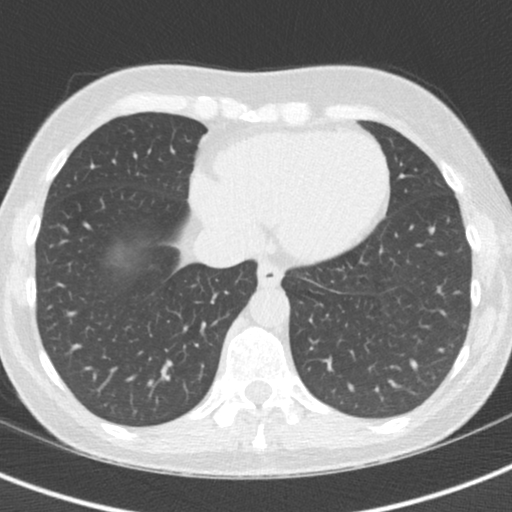
[im 23/45  lung]
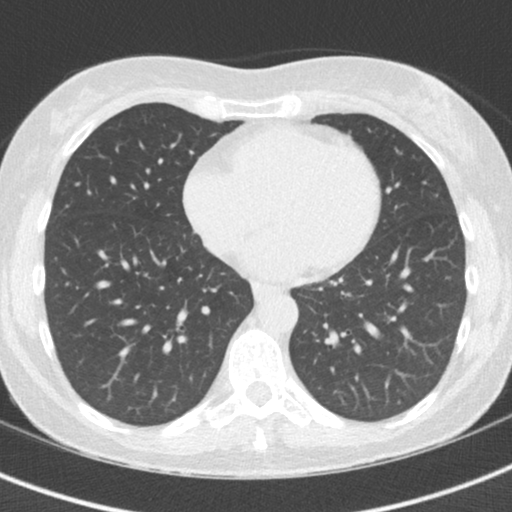
[im 30/45  lung]
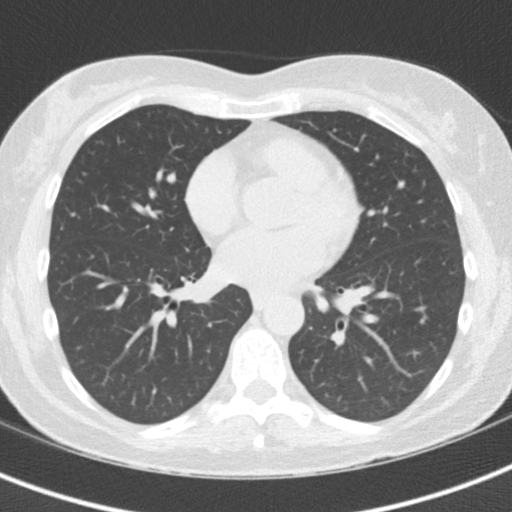
[im 37/45  lung]
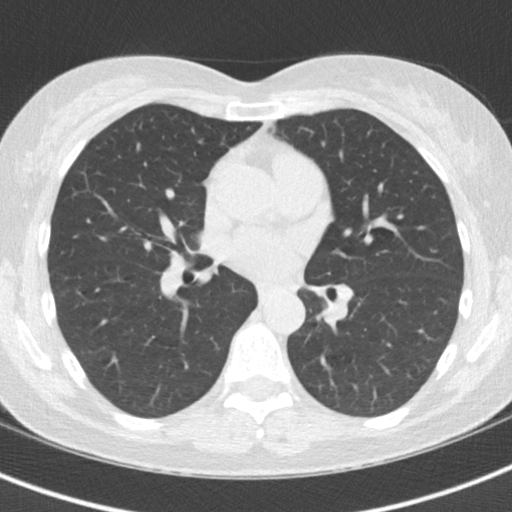

[14 of 20 positions shown; findings below may reference images not displayed]

FINDINGS: Vascular: Heart is normal size.  Visualized aorta normal caliber.

Mediastinum/Nodes: No adenopathy in the lower mediastinum or hila.

Lungs/Pleura: Visualized lungs clear.  No effusions.

Upper Abdomen: Imaging into the upper abdomen shows no acute
findings.

Musculoskeletal: Chest wall soft tissues are unremarkable. No acute
bony abnormality.
IMPRESSION: No acute or significant extracardiac abnormality.
FINDINGS: Non-cardiac: See separate report from [REDACTED].

Ascending Aorta: Normal Caliber. Minimal calcifications in the
descending aorta.

Pericardium: Normal

Coronary arteries: Normal coronary origins.
IMPRESSION: Coronary calcium score of 0. This was 0 percentile for age and sex
matched control.

HASHIMI

*** End of Addendum ***
EXAM:
OVER-READ INTERPRETATION  CT CHEST

The following report is an over-read performed by radiologist Dr.
HASHIMI [REDACTED] on [DATE]. This over-read
does not include interpretation of cardiac or coronary anatomy or
pathology. The coronary calcium score interpretation by the
cardiologist is attached.
FINDINGS: Vascular: Heart is normal size.  Visualized aorta normal caliber.

Mediastinum/Nodes: No adenopathy in the lower mediastinum or hila.

Lungs/Pleura: Visualized lungs clear.  No effusions.

Upper Abdomen: Imaging into the upper abdomen shows no acute
findings.

Musculoskeletal: Chest wall soft tissues are unremarkable. No acute
bony abnormality.
IMPRESSION: No acute or significant extracardiac abnormality.

## 2019-09-19 ENCOUNTER — Encounter: Payer: Self-pay | Admitting: Internal Medicine

## 2019-12-05 DIAGNOSIS — Z6823 Body mass index (BMI) 23.0-23.9, adult: Secondary | ICD-10-CM | POA: Diagnosis not present

## 2019-12-05 DIAGNOSIS — Z01419 Encounter for gynecological examination (general) (routine) without abnormal findings: Secondary | ICD-10-CM | POA: Diagnosis not present

## 2019-12-20 DIAGNOSIS — M461 Sacroiliitis, not elsewhere classified: Secondary | ICD-10-CM | POA: Diagnosis not present

## 2019-12-21 DIAGNOSIS — M461 Sacroiliitis, not elsewhere classified: Secondary | ICD-10-CM | POA: Diagnosis not present

## 2019-12-24 ENCOUNTER — Other Ambulatory Visit: Payer: Self-pay | Admitting: Gynecologic Oncology

## 2019-12-24 DIAGNOSIS — N949 Unspecified condition associated with female genital organs and menstrual cycle: Secondary | ICD-10-CM

## 2020-01-11 DIAGNOSIS — M461 Sacroiliitis, not elsewhere classified: Secondary | ICD-10-CM | POA: Diagnosis not present

## 2020-01-25 ENCOUNTER — Other Ambulatory Visit: Payer: Federal, State, Local not specified - PPO

## 2020-01-27 DIAGNOSIS — M461 Sacroiliitis, not elsewhere classified: Secondary | ICD-10-CM | POA: Diagnosis not present

## 2020-01-30 ENCOUNTER — Encounter: Payer: Self-pay | Admitting: Internal Medicine

## 2020-01-30 ENCOUNTER — Other Ambulatory Visit: Payer: Self-pay

## 2020-01-30 ENCOUNTER — Telehealth (INDEPENDENT_AMBULATORY_CARE_PROVIDER_SITE_OTHER): Payer: BC Managed Care – PPO | Admitting: Internal Medicine

## 2020-01-30 ENCOUNTER — Telehealth: Payer: Self-pay | Admitting: Internal Medicine

## 2020-01-30 DIAGNOSIS — J45901 Unspecified asthma with (acute) exacerbation: Secondary | ICD-10-CM | POA: Diagnosis not present

## 2020-01-30 DIAGNOSIS — J209 Acute bronchitis, unspecified: Secondary | ICD-10-CM | POA: Insufficient documentation

## 2020-01-30 MED ORDER — AZITHROMYCIN 250 MG PO TABS: ORAL_TABLET | ORAL | 0 refills | Status: DC

## 2020-01-30 MED ORDER — METHYLPREDNISOLONE 4 MG PO TBPK: ORAL_TABLET | ORAL | 0 refills | Status: DC

## 2020-01-30 NOTE — Telephone Encounter (Signed)
    Patient calling to report chest discomfort, wheezing, ear pressure  Call transferred to [facility]

## 2020-01-30 NOTE — Telephone Encounter (Signed)
S: Pt spoke with [facility] Nurse Diane & recommendation made at that time was to go to the ED due to chest tightness. B: Pt has had cold x 1week accompanied with a fever, chest tightness, wheezing, yellow nasal drainage, ear/facial pressure, denies SOB. Has hx of asthma & has been using inhaler as needed.  Covid Neg on Thursday 7/29 A: Pt declines going to ED b/c she believes her symptoms are r/t bronchitis & requests z-pack. Nurse Diane requests for patient to be seen in office. R: Dr Camila Li notified of above & requests patient to be seen virtually today; if unable to be seen by different provider, MD requests call be sent to him for z-pack.  Dr Quay Burow made aware of above & states she will see pt via mychart visit.  Pt scheduled for 1515 today & is aware.

## 2020-01-30 NOTE — Progress Notes (Signed)
Virtual Visit via Video Note  I connected with [name] on 01/30/20 at  3:15 PM EDT by a video enabled telemedicine application and verified that I am speaking with the correct person using two identifiers.   I discussed the limitations of evaluation and management by telemedicine and the availability of in person appointments. The patient expressed understanding and agreed to proceed.  Present for the visit:  Myself, Dr Billey Gosling, Georjean Mode.  The patient is currently at home and I am in the office.    No referring provider.    History of Present Illness: This is an acute visit for cold symptoms.  Her symptoms started about 1 week ago.  She just suddenly felt bad and started think she could be getting a sinus infection.  Last week she had a fever up to 101, coughing, sore throat and just felt terrible for 2 days.  Her symptoms continued except for the fever.  She states congestion, ear pain, sinus pain, sore throat, coughing, wheezing and feeling like she cannot take a full deep breath.  She does have headaches.  She is concerned she has bronchitis.  She does have chronic asthma and allergies and is taking all of her asthma and allergy medication.  She did start her maintenance inhaler twice daily, which has helped her cough-she typically does not take that.  She is taking Mucinex, and decongestant and doing the Nettie pot twice a day.  Her symptoms have persisted.  She did get tested for Covid because she works part-time at [employer] and that was negative.  Review of Systems  Constitutional: Positive for fever.  HENT: Positive for congestion, ear pain, sinus pain and sore throat.   Respiratory: Positive for cough and wheezing. Negative for shortness of breath.        Feels like she can not take a full need breath  Neurological: Positive for headaches.     Social History   Socioeconomic History  . Marital status: Married    Spouse name: Not on file  . Number of  children: Not on file  . Years of education: Not on file  . Highest education level: Not on file  Occupational History  . 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 Smoking status: Never Smoker  . Smokeless tobacco: Never Used  . Tobacco comment: Married, 1 child  Vaping Use  . Vaping Use: Never used  Substance and Sexual Activity  . Alcohol use: Yes    Alcohol/week: 2.0 standard drinks    Types: 1 Glasses of wine, 1 Cans of beer per week    Comment: daily  . Drug use: No  . Sexual activity: Yes  Other Topics Concern  . Not on file  Social History Narrative  . Not on file   Social Determinants of Health   Financial Resource Strain:   . Difficulty of Paying Living Expenses:   Food Insecurity:   . Worried About Charity fundraiser in the Last Year:   . Arboriculturist in the Last Year:   Transportation Needs:   . Film/video editor (Medical):   Marland Kitchen Lack of Transportation (Non-Medical):   Physical Activity:   . Days of Exercise per Week:   . Minutes of Exercise per Session:   Stress:   . Feeling of Stress :   Social Connections:   . Frequency of Communication with Friends and Family:   . Frequency of Social Gatherings with Friends and  Family:   . Attends Religious Services:   . Active Member of Clubs or Organizations:   . Attends Archivist Meetings:   Marland Kitchen Marital Status:      Observations/Objective: Appears well in NAD Breathing normally.  Occasional cough Skin appears warm and dry  Assessment and Plan:  See Problem List for Assessment and Plan of chronic medical problems.   Follow Up Instructions:    I discussed the assessment and treatment plan with the patient. The patient was provided an opportunity to ask questions and all were answered. The patient agreed with the plan and demonstrated an understanding of the instructions.   The patient was advised to call back or seek an in-person evaluation if the symptoms worsen or if the  condition fails to improve as anticipated.    Binnie Rail, MD

## 2020-01-30 NOTE — Assessment & Plan Note (Signed)
Acute Bronchitis with asthma exacerbation Concern for bacterial cause Start Z-Pak, which has been effective for her in the past Medrol Dosepak for some tightness, feeling of not being able to get a deep breath Continue inhaler twice daily, allergy medications, over-the-counter cold medications Call if no improvement or with any questions

## 2020-02-09 ENCOUNTER — Encounter: Payer: Self-pay | Admitting: Family Medicine

## 2020-02-09 ENCOUNTER — Ambulatory Visit (INDEPENDENT_AMBULATORY_CARE_PROVIDER_SITE_OTHER): Payer: BC Managed Care – PPO

## 2020-02-09 ENCOUNTER — Ambulatory Visit (INDEPENDENT_AMBULATORY_CARE_PROVIDER_SITE_OTHER): Payer: BC Managed Care – PPO | Admitting: Family Medicine

## 2020-02-09 ENCOUNTER — Other Ambulatory Visit: Payer: Self-pay

## 2020-02-09 VITALS — BP 110/74 | HR 79 | Ht 64.5 in | Wt 134.0 lb

## 2020-02-09 DIAGNOSIS — M545 Low back pain, unspecified: Secondary | ICD-10-CM

## 2020-02-09 DIAGNOSIS — M5442 Lumbago with sciatica, left side: Secondary | ICD-10-CM

## 2020-02-09 DIAGNOSIS — M47816 Spondylosis without myelopathy or radiculopathy, lumbar region: Secondary | ICD-10-CM | POA: Diagnosis not present

## 2020-02-09 DIAGNOSIS — G8929 Other chronic pain: Secondary | ICD-10-CM | POA: Diagnosis not present

## 2020-02-09 IMAGING — DX DG LUMBAR SPINE 2-3V
3 series · 3 of 3 positions shown · non-contrast
Comparison: None.

CLINICAL DATA: Chronic left lower back pain.  No injury.

EXAM:
LUMBAR SPINE - 2-3 VIEW

[l-spine ap]
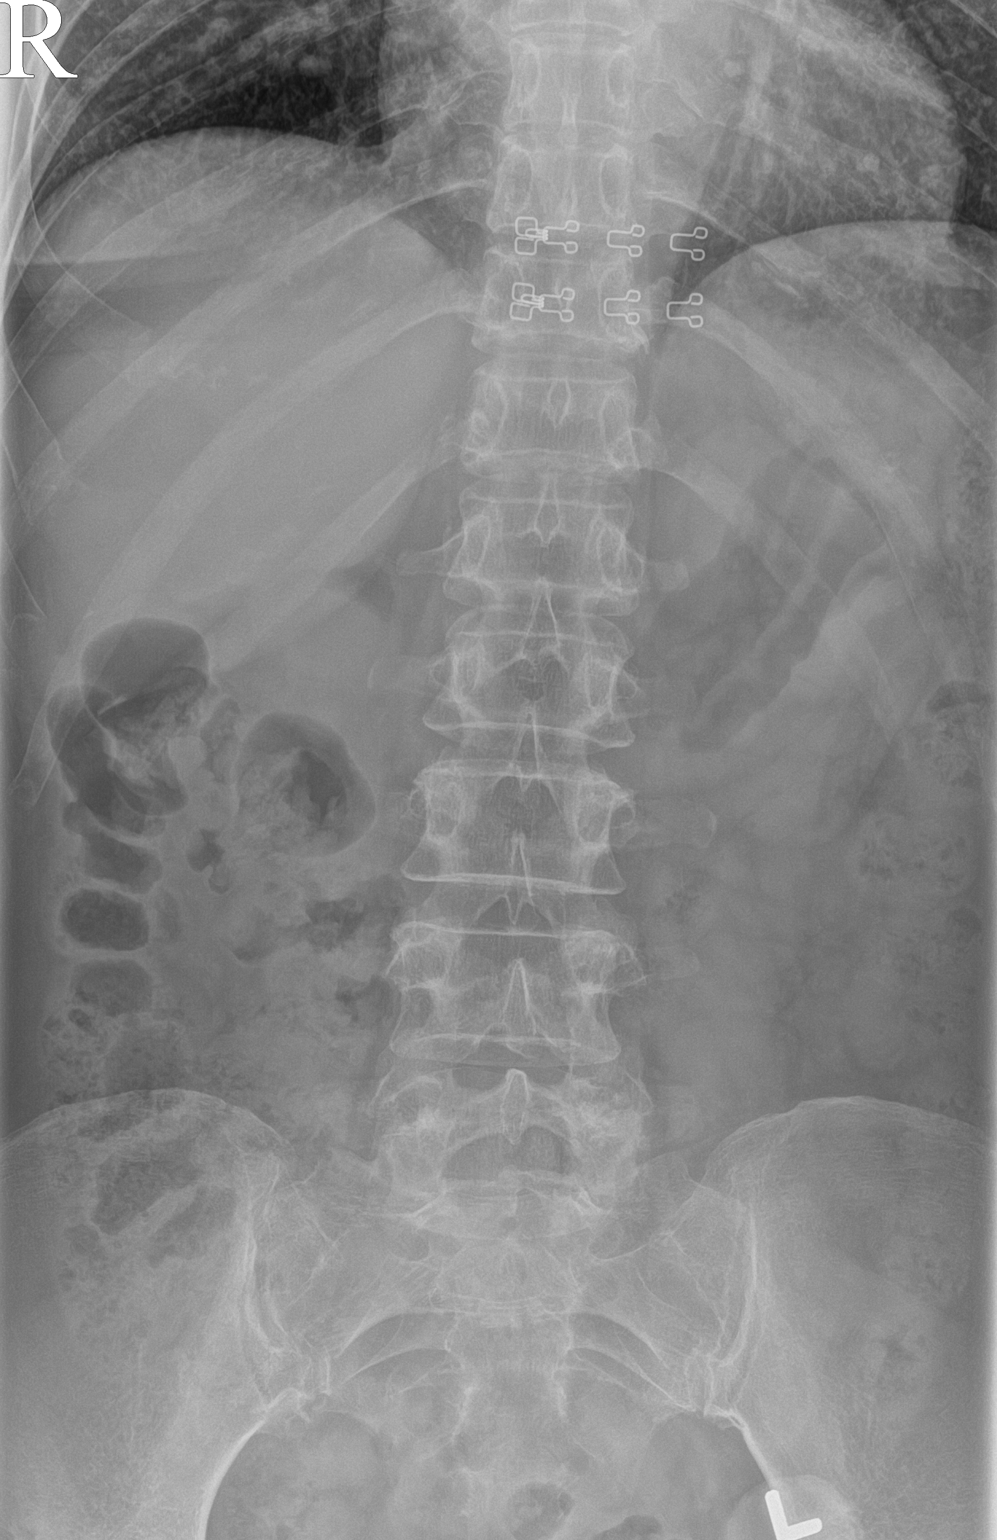

[l-spine lateral (1 of 2)]
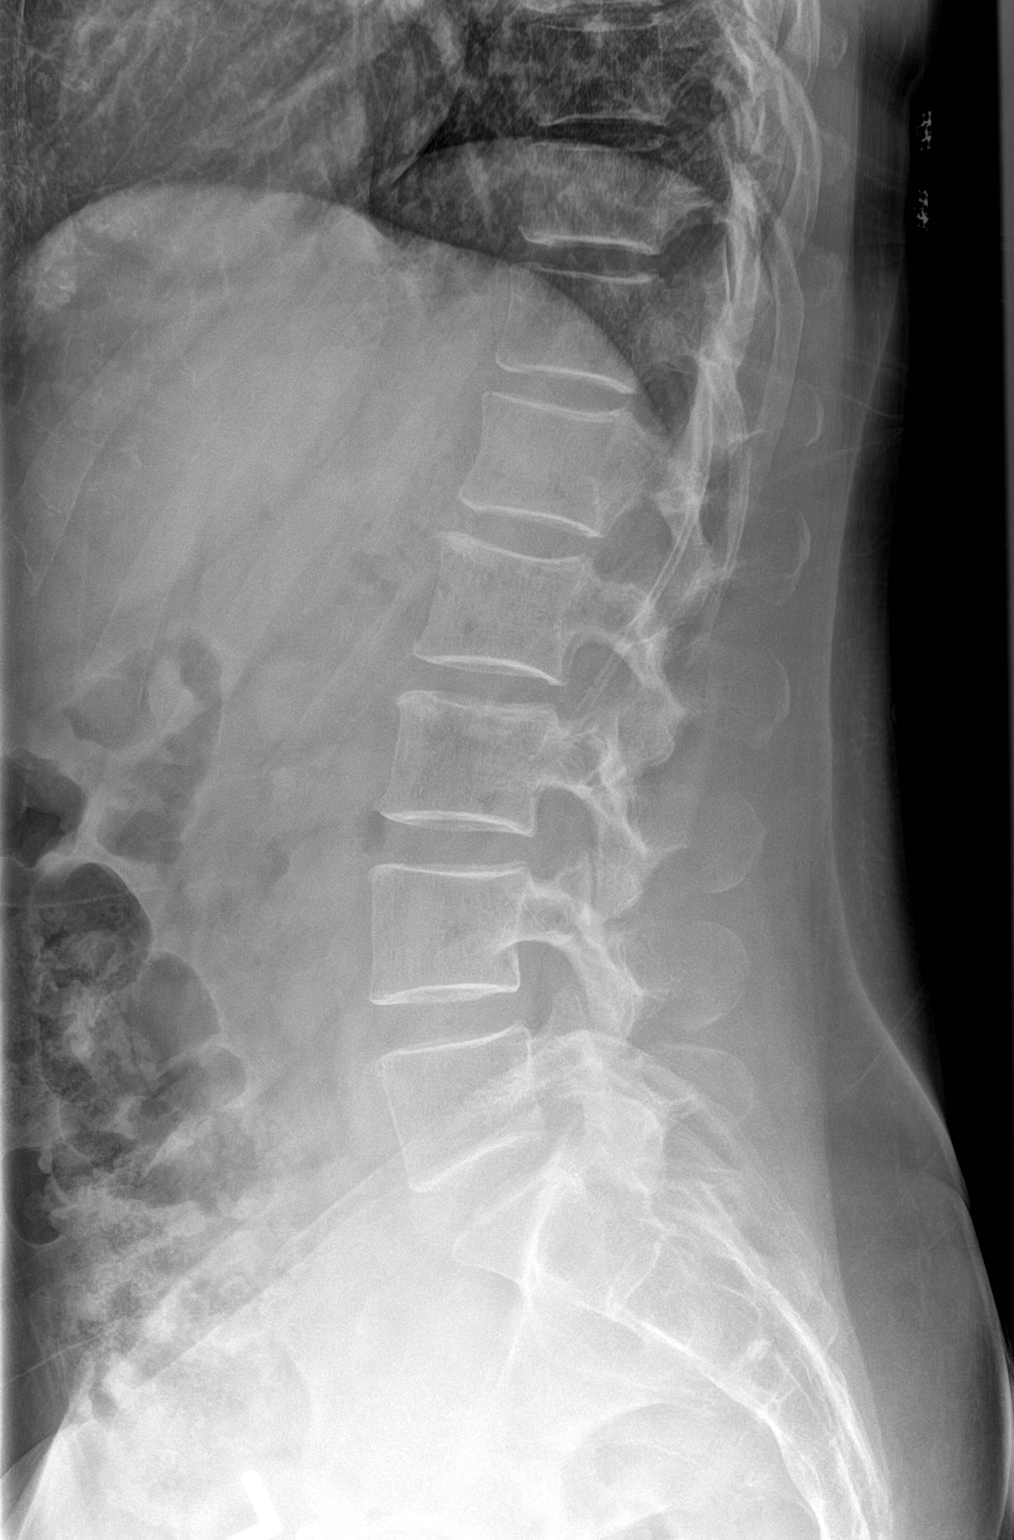

[l-spine lateral (2 of 2)]
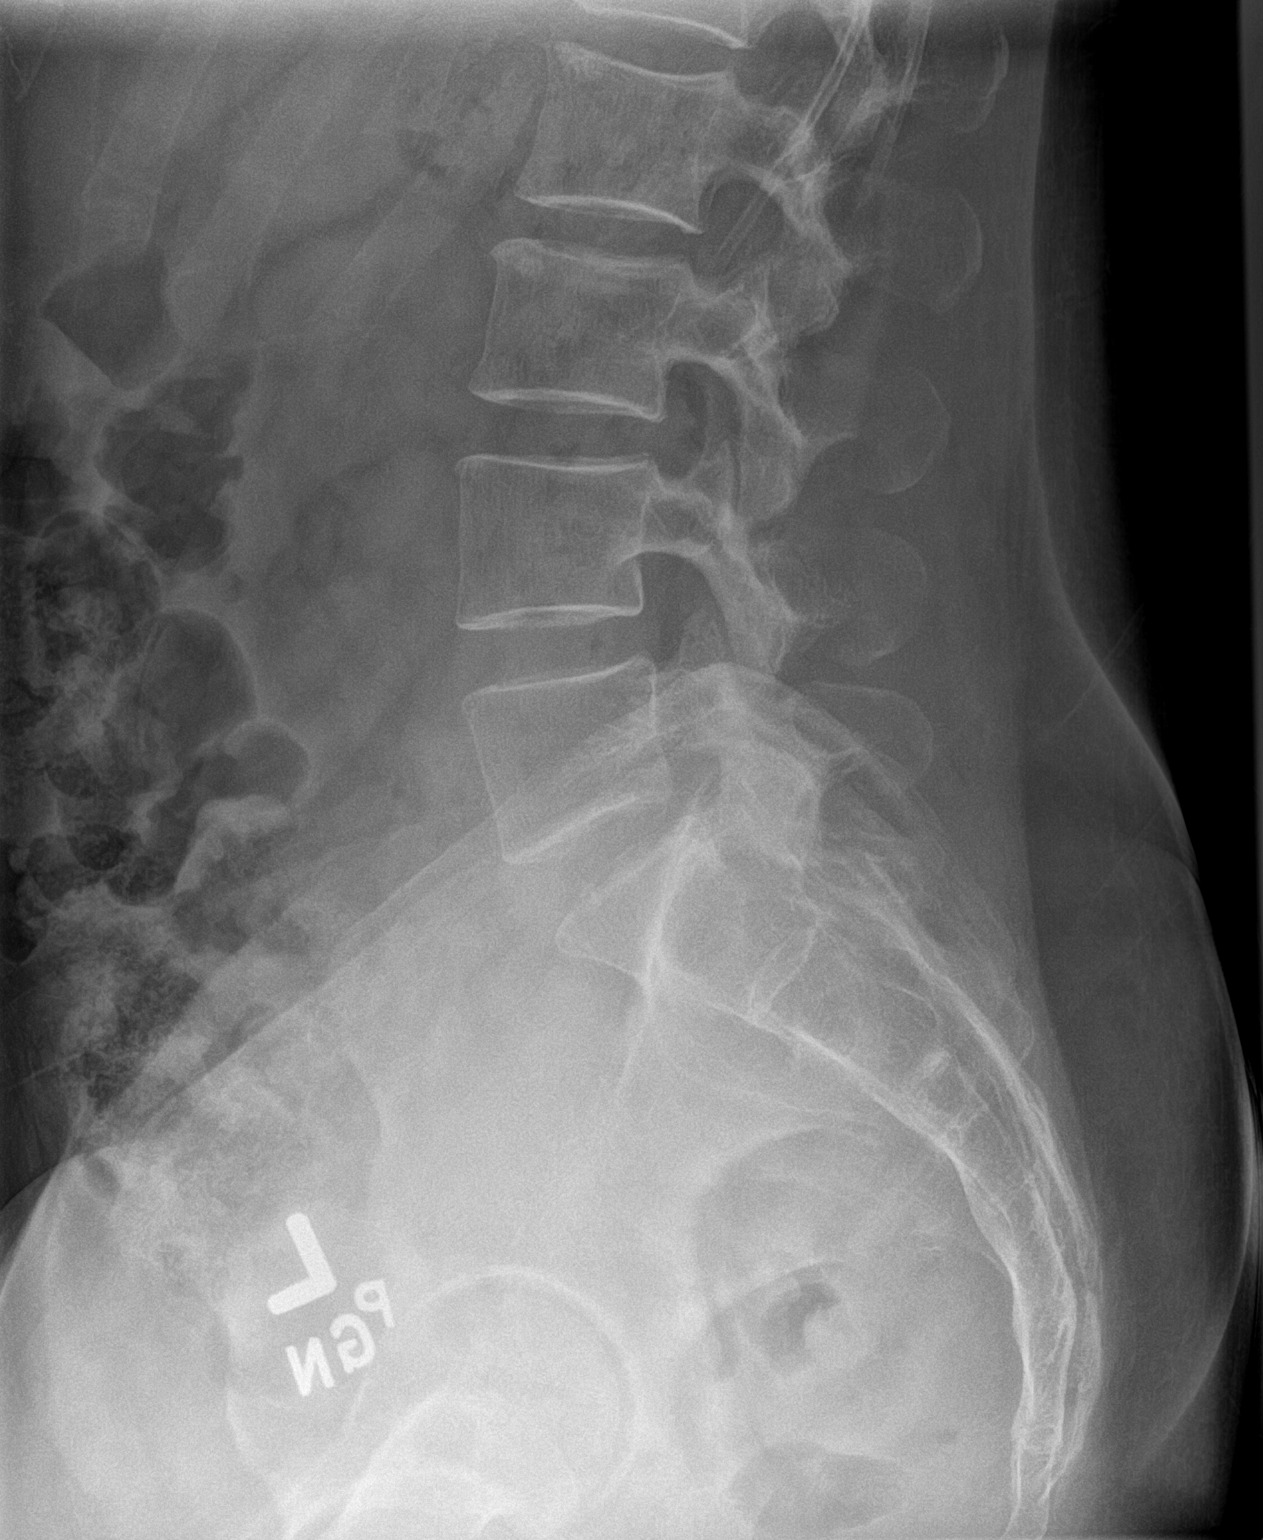

[3 of 3 positions shown; findings below may reference images not displayed]

FINDINGS: Vertebral body alignment, heights and disc space heights are normal.
There is mild spondylosis of the lumbar spine to include minimal
facet arthropathy over the lower lumbar spine. No evidence of
compression fracture or spondylolisthesis.
IMPRESSION: 1. No acute findings.
2. Mild spondylosis of the lumbar spine.

## 2020-02-09 MED ORDER — MELOXICAM 7.5 MG PO TABS
7.5000 mg | ORAL_TABLET | Freq: Every day | ORAL | 0 refills | Status: DC
Start: 2020-02-09 — End: 2020-05-30

## 2020-02-09 MED ORDER — GABAPENTIN 100 MG PO CAPS
200.0000 mg | ORAL_CAPSULE | Freq: Every day | ORAL | 0 refills | Status: DC
Start: 2020-02-09 — End: 2020-05-30

## 2020-02-09 NOTE — Assessment & Plan Note (Signed)
I do believe the patient's pain is secondary to more of a lumbar radiculopathy with some pain going into more the piriformis area.  Likely more in the L4-L5 and L5-S1 areas.  X-rays ordered today, discussed with patient about different exercises but continuing to work with the physical therapist.  Gabapentin given for some nighttime relief that I think will be beneficial plus will tell us how much of this being nerve related.  Meloxicam given for breakthrough pain.  Follow-up again in 4 to 8 weeks worsening pain consider formal physical therapy which patient is already done versus the possibility of advanced imaging and epidurals

## 2020-02-09 NOTE — Patient Instructions (Addendum)
Gabapentin Meloxicam 7.5 Xray  Back exercises See me again in 6 weeks

## 2020-02-09 NOTE — Progress Notes (Signed)
[place] Phone: [phone] Subjective:   [name], am serving as a scribe for Dr. Hulan Saas. This visit occurred during the SARS-CoV-2 public health emergency.  Safety protocols were in place, including screening questions prior to the visit, additional usage of staff PPE, and extensive cleaning of exam room while observing appropriate contact time as indicated for disinfecting solutions.   I'm seeing this patient by the request  of:  Plotnikov, [name], MD  CC: Low back pain  [facility]:[id number]  HELMI HECHAVARRIA is a 64 y.o. female coming in with complaint of left sided low back and hip pain. Pain in both sides of back that is excruciating. Pain improves with movement. Last seen in 2017 for Morton's Neuroma. History osteoporosis. Low estrogren due to ovary removal. Has flares of left QL due to patient care. She is a PT inpatient for brain injury/stroke patients. Sees Barbaraann Barthel currently. Pain worse with rotation in QL and glutes. Denies any radiating symptoms. Patient exercises regularly. Reclast infusion but numbers did not improve.  Patient does not take NSAIDs due to gastritis.    Patient's last x-rays were right hip and pelvis back in 2014 that were unremarkable.  These were independently visualized by me.   Past Medical History:  Diagnosis Date  . ALLERGIC RHINITIS 03/02/2007  . Anemia   . ANXIETY 03/02/2007  . Arthritis   . ASTHMA 11/12/2007   Dr Orvil Feil  . Cancer (HCC)    skin, hx of  . Dysrhythmia    pt. states at night will notice a different heart rhythem  . Endometrial cancer ([place])   . FIBROCYSTIC BREAST DISEASE, HX OF 03/02/2007  . GERD 03/02/2007  . HYPOTHYROIDISM 11/12/2007   Dr Chalmers Cater  . Neuromuscular disorder (HCC)    carpel tunnel syndrome, compression of ulner nerve at elbows  . OSTEOPENIA 11/12/2007  . PEPTIC ULCER DISEASE 03/02/2007  . Pneumonia    hx. of  . Radiation 09/27/15-10/23/15    HDR to vaginal cuff 30 Gy  . Stress fracture   . TMJ SYNDROME 11/12/2007   Past Surgical History:  Procedure Laterality Date  . 2008 TMJ surgery     . carpel tunnel Right 11/20/2016  . colonoscopy x 2    . cubital tunnel release Right 11/20/2016  . deviated septum surgery - 2007    . DILATION AND CURETTAGE OF UTERUS     2017  . endoscopy - 1990    . Mandib advancement forward  2009  . ROBOTIC ASSISTED TOTAL HYSTERECTOMY WITH BILATERAL SALPINGO OOPHERECTOMY Bilateral 08/14/2015   Procedure: XI ROBOTIC ASSISTED TOTAL HYSTERECTOMY WITH BILATERAL SALPINGO OOPHORECTOMY AND SENTAL LYMPH NODE BIOPSY;  Surgeon: Everitt Amber, MD;  Location: [facility];  Service: Gynecology;  Laterality: Bilateral;  . 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 Marital status: Married    Spouse name: Not on file  . Number of children: Not on file  . Years of education: Not on file  . Highest education level: Not on file  Occupational History  . 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 Smoking status: Never Smoker  . Smokeless tobacco: Never Used  . Tobacco comment: Married, 1 child  Vaping Use  . Vaping Use: Never used  Substance and Sexual Activity  . Alcohol use: Yes    Alcohol/week: 2.0 standard drinks  Types: 1 Glasses of wine, 1 Cans of beer per week    Comment: daily  . Drug use: No  . Sexual activity: Yes  Other Topics Concern  . Not on file  Social History Narrative  . Not on file   Social Determinants of Health   Financial Resource Strain:   . Difficulty of Paying Living Expenses:   Food Insecurity:   . Worried About Charity fundraiser in the Last Year:   . Arboriculturist in the Last Year:   Transportation Needs:   . Film/video editor (Medical):   Marland Kitchen Lack of Transportation (Non-Medical):   Physical Activity:   . Days of Exercise per Week:   . Minutes of Exercise per Session:   Stress:   . Feeling  of Stress :   Social Connections:   . Frequency of Communication with Friends and Family:   . Frequency of Social Gatherings with Friends and Family:   . Attends Religious Services:   . Active Member of Clubs or Organizations:   . Attends Archivist Meetings:   Marland Kitchen Marital Status:    Allergies  Allergen Reactions  . Astroglide [Gyne-Moistrin] Other (See Comments)    Itching and burning  . Colchicine     diarrhea  . Demerol Nausea And Vomiting  . Penicillins Hives and Other (See Comments)    Face turned red and had a headache. Has patient had a PCN reaction causing immediate rash, facial/tongue/throat swelling, SOB or lightheadedness with hypotension: no Has patient had a PCN reaction causing severe rash involving mucus membranes or skin necrosis: no Has patient had a PCN reaction that required hospitalization: no Has patient had a PCN reaction occurring within the last 10 years: no If all of the above answers are "NO", then may proceed with Cephalosporin use.  . Shellfish Allergy Other (See Comments)    Intolerance   Family History  Problem Relation Age of Onset  . Hypertension Father   . Heart disease Father 40       chf  . Heart disease Mother 53       CAD  . Hypertension Other   . Asthma Neg Hx     Current Outpatient Medications (Endocrine & Metabolic):  .  levothyroxine (SYNTHROID, LEVOTHROID) 25 MCG tablet, Take 25-50 mcg by mouth daily. She takes one tablet daily 4 days per week (Monday-Thursday) and two tablets daily 3 days per week (Friday-Sunday). Marland Kitchen  liothyronine (CYTOMEL) 5 MCG tablet, Take 5-10 mcg by mouth 2 (two) times daily. She takes one tablet in the morning and two tablets midday. .  methylPREDNISolone (MEDROL DOSEPAK) 4 MG TBPK tablet, 24 mg PO on day 1, then decr. by 4 mg/day x5 days .  zoledronic acid (RECLAST) 5 MG/100ML SOLN injection, Inject 5 mg into the vein. Once a year  Current Outpatient Medications (Cardiovascular):  Marland Kitchen  EPIPEN 2-PAK 0.3  MG/0.3ML DEVI, Inject 0.3 mg into the muscle once. Reported on 09/03/2015  Current Outpatient Medications (Respiratory):  .  cetirizine (ZYRTEC) 5 MG tablet, Take 5 mg by mouth 2 (two) times daily as needed for allergies. .  fluticasone (FLONASE) 50 MCG/ACT nasal spray, Place 1 spray into the nose daily as needed for allergies. Reported on 08/06/2015    Current Outpatient Medications (Other):  .  azithromycin (ZITHROMAX) 250 MG tablet, Take two tabs the first day and then one tab daily for four days .  Calcium Citrate-Vitamin D (CALCIUM CITRATE +D PO), Take 2  tablets by mouth daily.  .  cholecalciferol (VITAMIN D) 1000 units tablet, Take 2,000 Units by mouth daily. Marland Kitchen  co-enzyme Q-10 30 MG capsule, Take 30 mg by mouth 3 (three) times daily. Marland Kitchen  estradiol (ESTRACE) 0.1 MG/GM vaginal cream, PLACE 1 APPLICATORFUL VAGINALLY 3 (THREE) TIMES A WEEK .  famotidine (PEPCID) 20 MG tablet, Take 1 tablet (20 mg total) by mouth daily as needed for heartburn or indigestion. Take with Meloxicam .  MELATONIN-CHAMOMILE PO, Take 1 capsule by mouth at bedtime as needed and may repeat dose one time if needed (For sleep.).  Marland Kitchen  METRONIDAZOLE, TOPICAL, 0.75 % LOTN, Apply 1 application topically at bedtime. .  minoxidil (ROGAINE) 2 % external solution, Apply 1 application topically 4 (four) times a week.  .  Multiple Vitamin (MULTIVITAMIN WITH MINERALS) TABS tablet, Take 1 tablet by mouth daily. .  Omega-3 Fatty Acids (FISH OIL) 1000 MG CAPS, Take 1 capsule by mouth daily. Marland Kitchen  pyridoxine (B-6) 200 MG tablet, Take 200 mg by mouth daily. .  TURMERIC PO, Take 1 capsule by mouth daily. Marland Kitchen  venlafaxine XR (EFFEXOR-XR) 37.5 MG 24 hr capsule, TAKE 1 CAPSULE BY MOUTH EVERY DAY WITH BREAKFAST .  vitamin C (ASCORBIC ACID) 500 MG tablet, Take 500 mg by mouth daily. .  vitamin E 400 UNIT capsule, Take 400 Units by mouth daily.   Reviewed prior external information including notes and imaging from  primary care provider As well as  notes that were available from care everywhere and other healthcare systems.  Past medical history, social, surgical and family history all reviewed in electronic medical record.  No pertanent information unless stated regarding to the chief complaint.   Review of Systems:  No headache, visual changes, nausea, vomiting, diarrhea, constipation, dizziness, abdominal pain, skin rash, fevers, chills, night sweats, weight loss, swollen lymph nodes, body aches, joint swelling, chest pain, shortness of breath, mood changes. POSITIVE muscle aches  Objective  There were no vitals taken for this visit.   General: No apparent distress alert and oriented x3 mood and affect normal, dressed appropriately.  HEENT: Pupils equal, extraocular movements intact  Respiratory: Patient's speak in full sentences and does not appear short of breath  Cardiovascular: No lower extremity edema, non tender, no erythema  Neuro: Cranial nerves II through XII are intact, neurovascularly intact in all extremities with 2+ DTRs and 2+ pulses.  Gait normal with good balance and coordination.  MSK:  Non tender with full range of motion and good stability and symmetric strength and tone of shoulders, elbows, wrist, hip, knee and ankles bilaterally.  Back Exam:  Inspection: Mild loss of lordosis Motion: Flexion 45 deg, Extension 25 deg, Side Bending to 35 deg bilaterally,  Rotation to 45 deg bilaterally  SLR laying: Negative  XSLR laying: Negative  Palpable tenderness: Tender to palpation in the paraspinal musculature of the lumbar spine left greater than right. FABER: Positive left. Sensory change: Gross sensation intact to all lumbar and sacral dermatomes.  Reflexes: 2+ at both patellar tendons, 2+ at achilles tendons, Babinski's downgoing.  Strength at foot  Plantar-flexion: 5/5 Dorsi-flexion: 5/5 Eversion: 5/5 Inversion: 5/5  Leg strength  Quad: 5/5 Hamstring: 5/5 Hip flexor: 5/5 Hip abductors: 5/5  Gait  unremarkable.     Impression and Recommendations:     The above documentation has been reviewed and is accurate and complete Lyndal Pulley, DO       Note: This dictation was prepared with Dragon dictation along with smaller phrase  technology. Any transcriptional errors that result from this process are unintentional.

## 2020-03-01 ENCOUNTER — Ambulatory Visit: Payer: Federal, State, Local not specified - PPO | Admitting: Family Medicine

## 2020-03-27 ENCOUNTER — Ambulatory Visit (INDEPENDENT_AMBULATORY_CARE_PROVIDER_SITE_OTHER): Payer: BC Managed Care – PPO | Admitting: Internal Medicine

## 2020-03-27 ENCOUNTER — Ambulatory Visit (INDEPENDENT_AMBULATORY_CARE_PROVIDER_SITE_OTHER): Payer: BC Managed Care – PPO | Admitting: Family Medicine

## 2020-03-27 ENCOUNTER — Encounter: Payer: Self-pay | Admitting: Family Medicine

## 2020-03-27 ENCOUNTER — Encounter: Payer: Self-pay | Admitting: Internal Medicine

## 2020-03-27 ENCOUNTER — Other Ambulatory Visit: Payer: Self-pay

## 2020-03-27 ENCOUNTER — Ambulatory Visit (INDEPENDENT_AMBULATORY_CARE_PROVIDER_SITE_OTHER): Payer: BC Managed Care – PPO

## 2020-03-27 VITALS — BP 118/70 | HR 70 | Ht 64.5 in | Wt 137.0 lb

## 2020-03-27 VITALS — BP 120/80 | HR 62 | Temp 98.4°F | Ht 64.5 in | Wt 136.0 lb

## 2020-03-27 DIAGNOSIS — G8929 Other chronic pain: Secondary | ICD-10-CM

## 2020-03-27 DIAGNOSIS — K2950 Unspecified chronic gastritis without bleeding: Secondary | ICD-10-CM

## 2020-03-27 DIAGNOSIS — Z23 Encounter for immunization: Secondary | ICD-10-CM

## 2020-03-27 DIAGNOSIS — M5442 Lumbago with sciatica, left side: Secondary | ICD-10-CM

## 2020-03-27 DIAGNOSIS — K3184 Gastroparesis: Secondary | ICD-10-CM | POA: Diagnosis not present

## 2020-03-27 DIAGNOSIS — M810 Age-related osteoporosis without current pathological fracture: Secondary | ICD-10-CM

## 2020-03-27 DIAGNOSIS — M25552 Pain in left hip: Secondary | ICD-10-CM

## 2020-03-27 IMAGING — DX DG HIP (WITH OR WITHOUT PELVIS) 2-3V*L*
3 series · 3 of 3 positions shown · non-contrast
Comparison: [DATE]

CLINICAL DATA: Chronic left hip pain

EXAM:
DG HIP (WITH OR WITHOUT PELVIS) 2-3V LEFT

[pelvis ap]
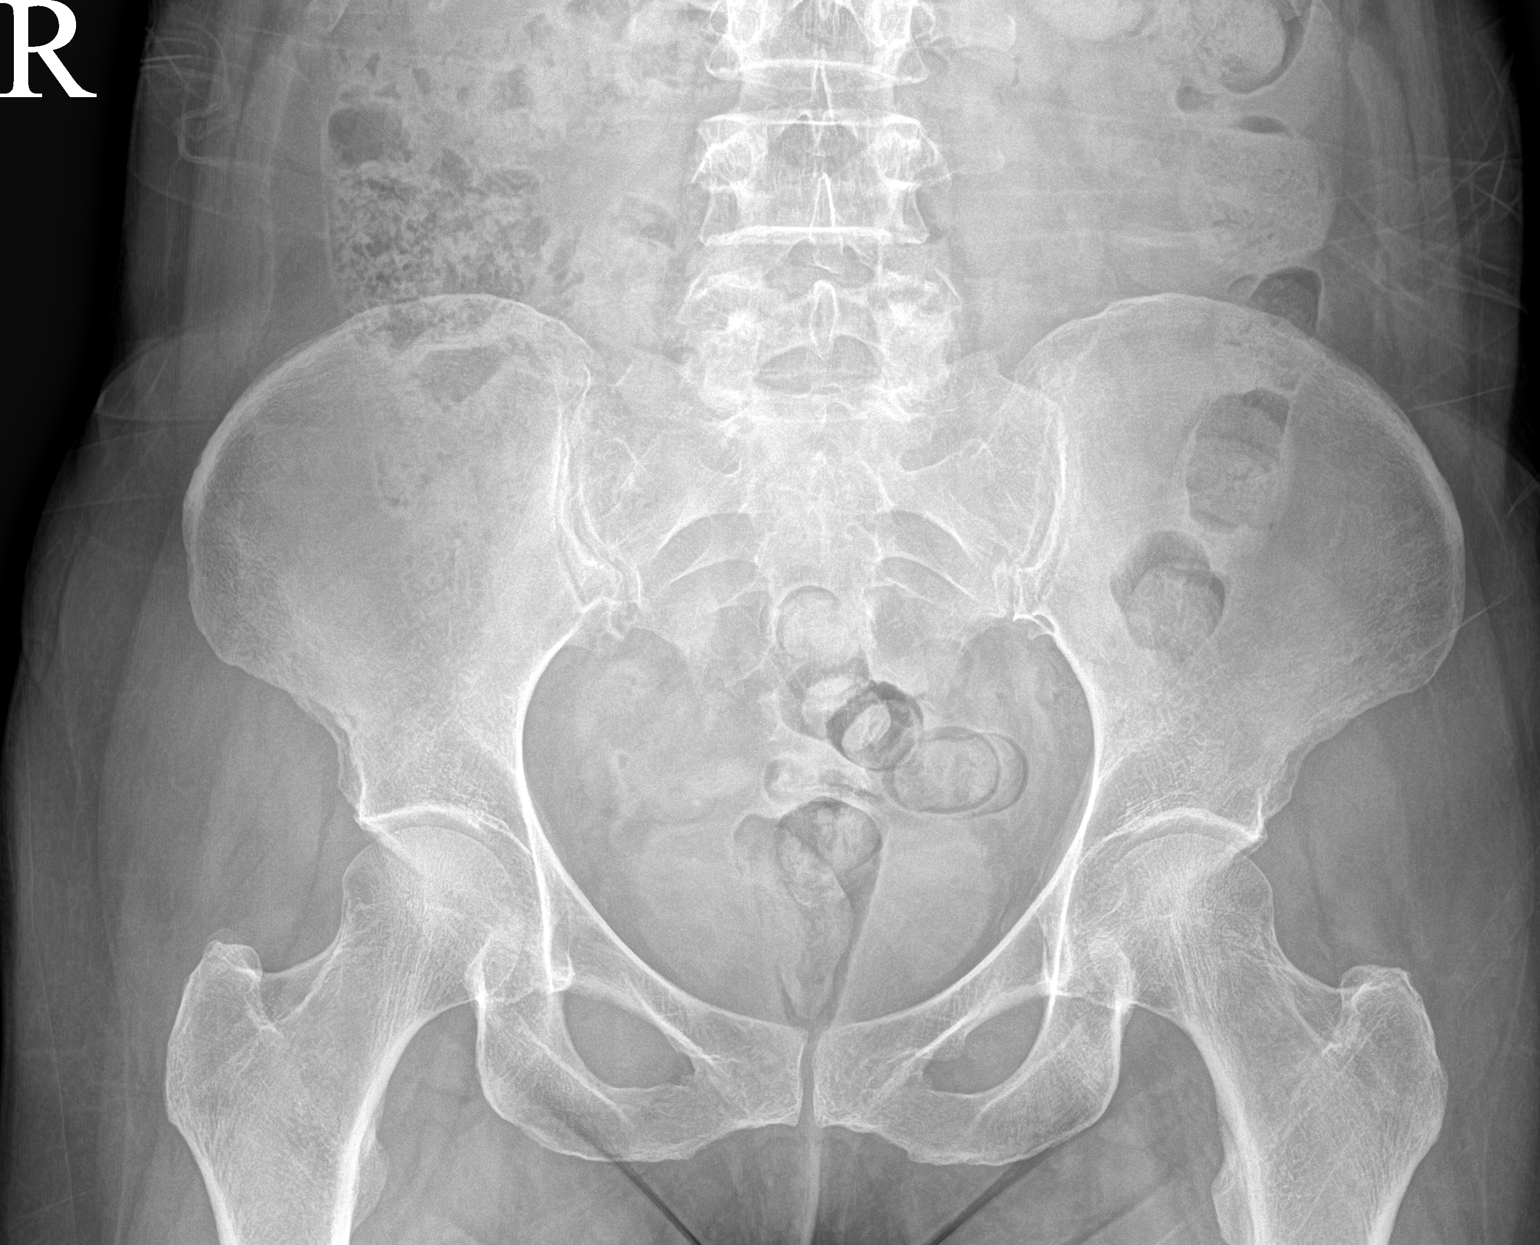

[hip ap]
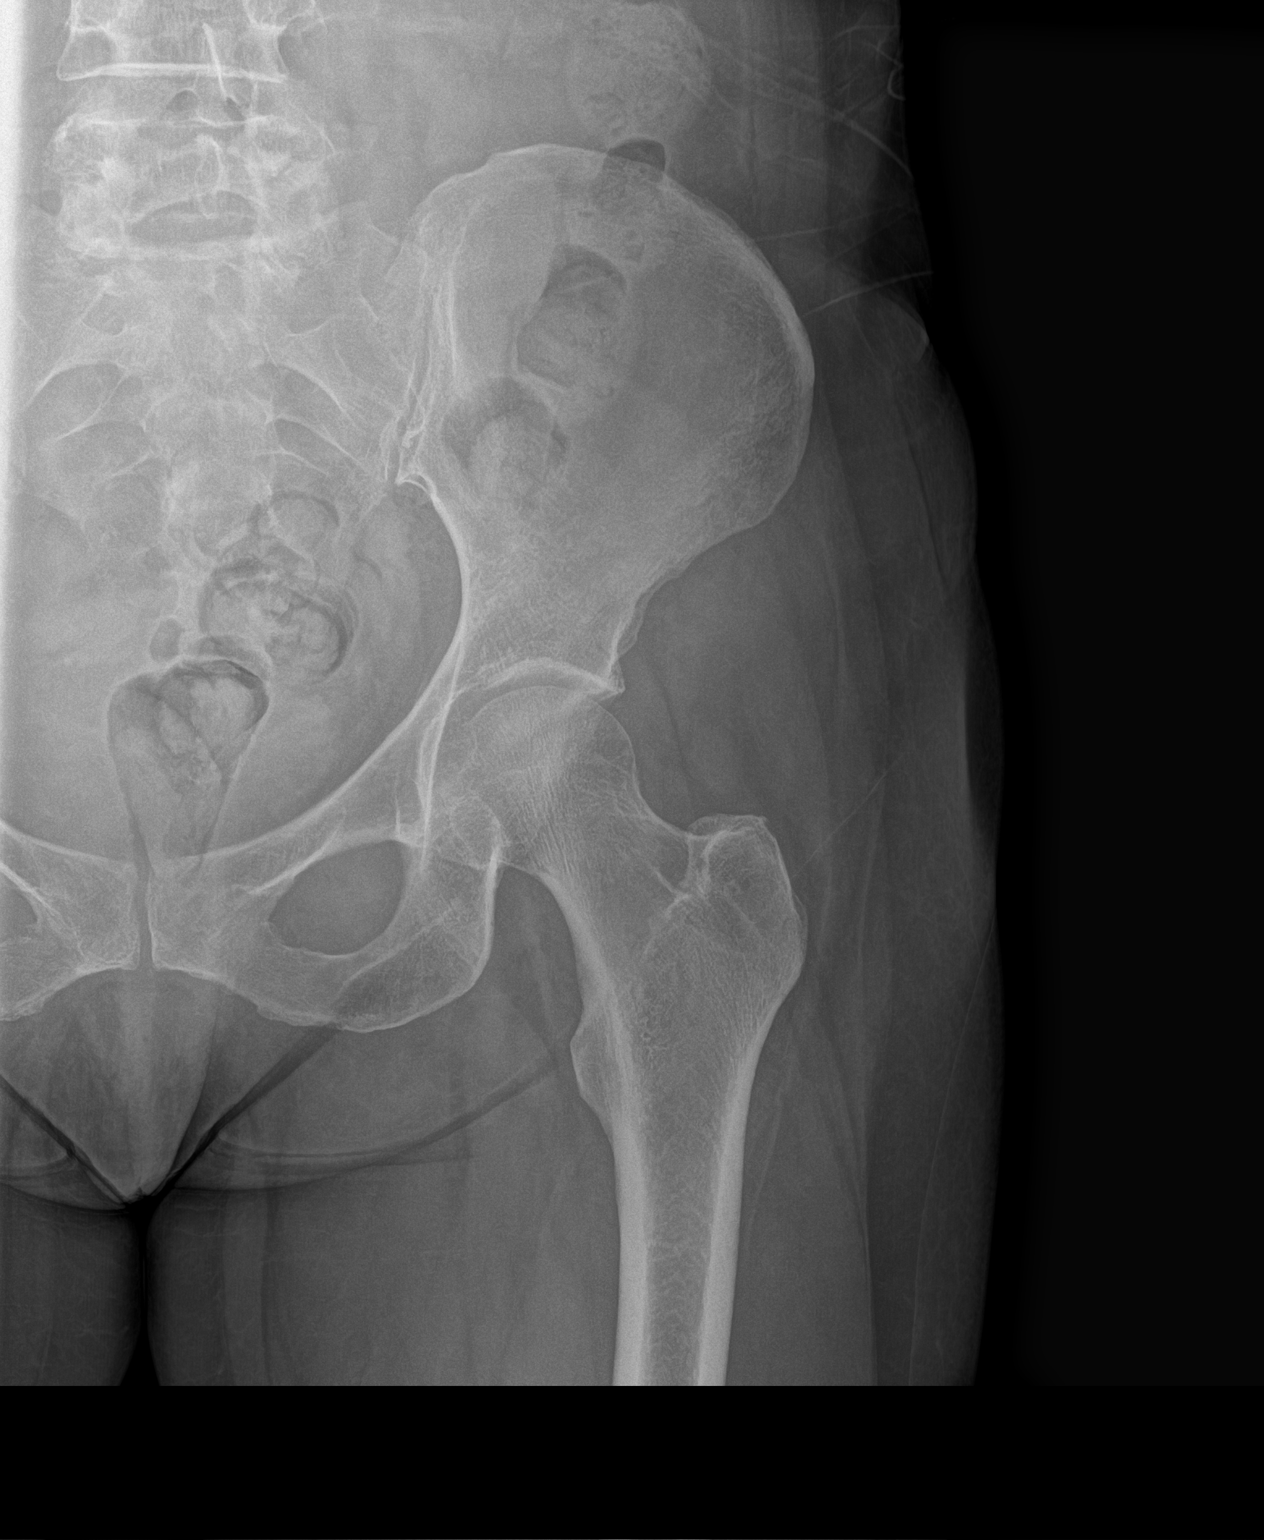

[hip frog leg]
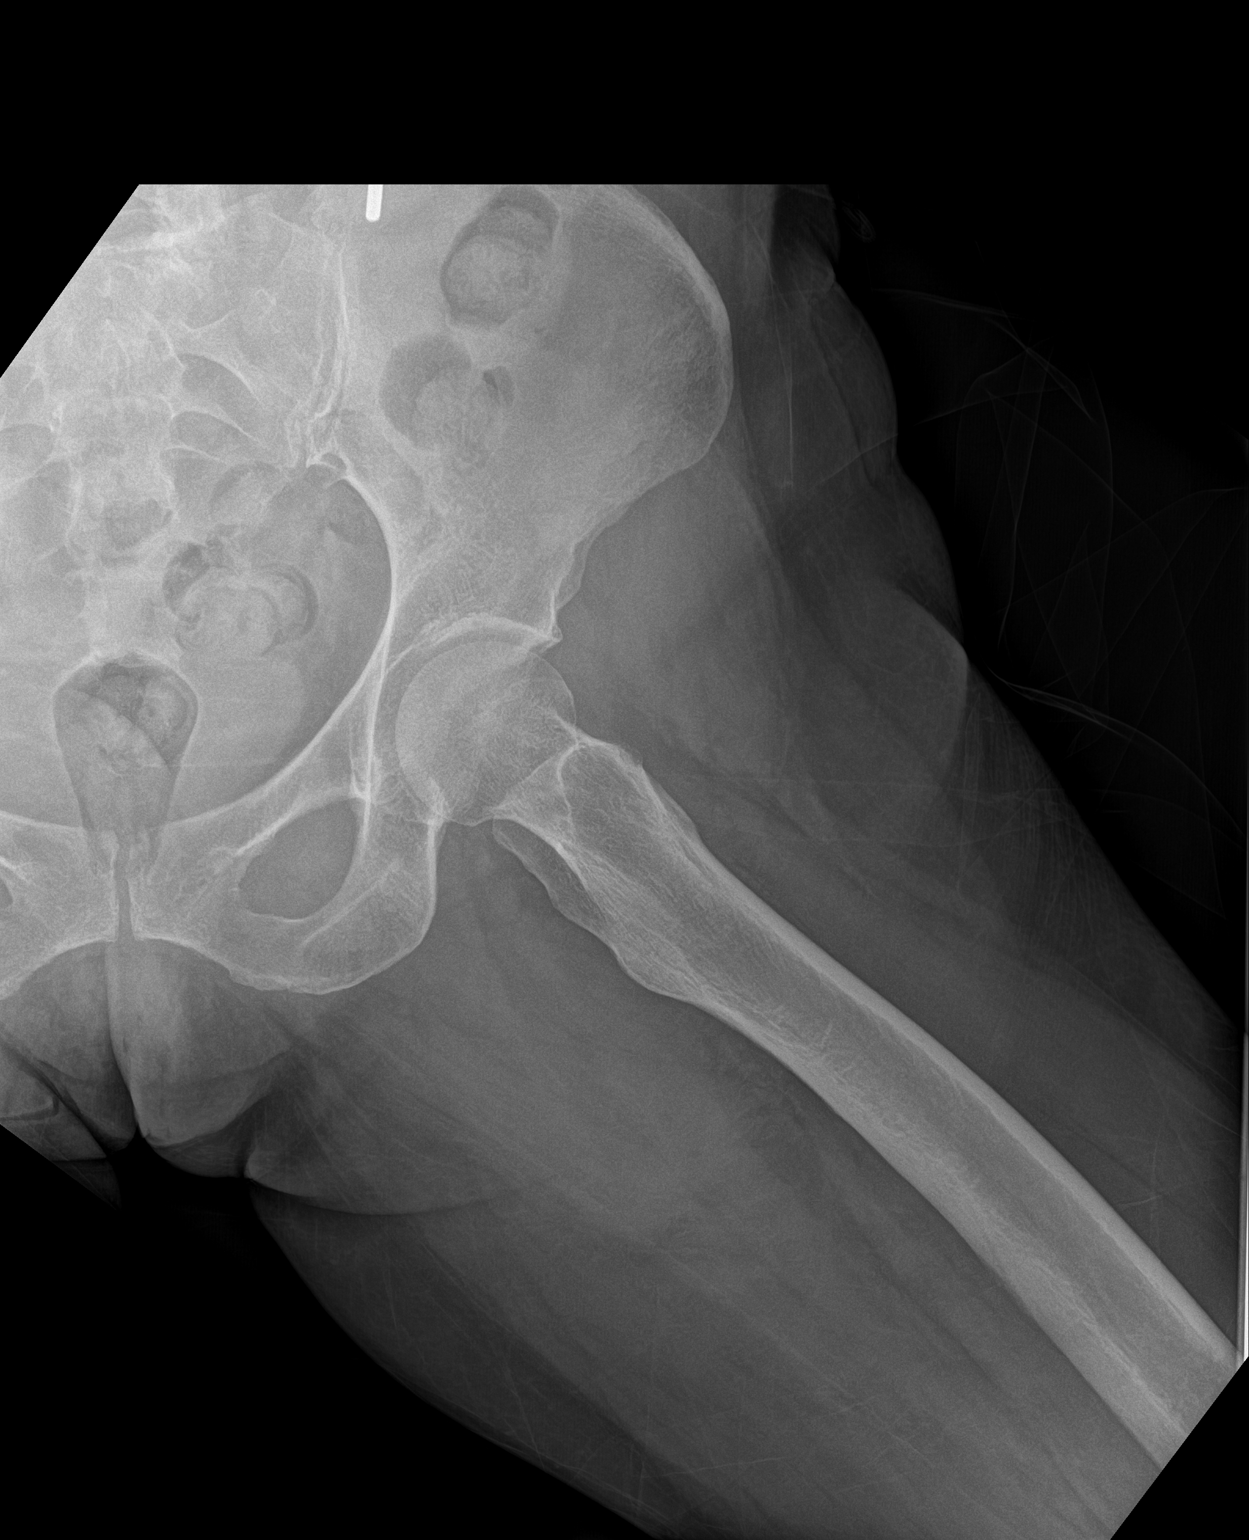

[3 of 3 positions shown; findings below may reference images not displayed]

FINDINGS: There is no evidence of hip fracture or dislocation. There is no
evidence of arthropathy or other focal bone abnormality.
IMPRESSION: Negative.

## 2020-03-27 MED ORDER — FAMOTIDINE 40 MG PO TABS: 40.0000 mg | ORAL_TABLET | Freq: Every day | ORAL | 3 refills | Status: DC

## 2020-03-27 MED ORDER — ZENPEP 40000-126000 UNITS PO CPEP: 1.0000 | ORAL_CAPSULE | Freq: Three times a day (TID) | ORAL | 5 refills | Status: DC

## 2020-03-27 NOTE — Assessment & Plan Note (Signed)
Try Lion's Mane Mushroom extract or capsules

## 2020-03-27 NOTE — Patient Instructions (Signed)
You can try Lion's Mane Mushroom extract or capsules 

## 2020-03-27 NOTE — Progress Notes (Signed)
[name] [place] Phone: [phone] Subjective:    I'm seeing this patient by the request  of:  Plotnikov, [name], MD  CC: Low gluten and back pain follow-up  [id number]   02/09/2020 I do believe the patient's pain is secondary to more of a lumbar radiculopathy with some pain going into more the piriformis area.  Likely more in the L4-L5 and L5-S1 areas.  X-rays ordered today, discussed with patient about different exercises but continuing to work with the physical therapist.  Gabapentin given for some nighttime relief that I think will be beneficial plus will tell us how much of this being nerve related.  Meloxicam given for breakthrough pain.  Follow-up again in 4 to 8 weeks worsening pain consider formal physical therapy which patient is already done versus the possibility of advanced imaging and epidurals  Update 03/27/2020 [name] is a 64 y.o. female coming in with complaint of low back and glute pain. Patient states her back is doing a little better. Her hip is still an issue. Believes she is having side effects from gabapentin. Due to GI issues she has not started meloxicam.  Patient does believe that the gabapentin is helping but is causing the gastrointestinal issues.  Wanting to know if there is anything else she could do.  Patient would state that she is approximately 30% better overall.      Past Medical History:  Diagnosis Date  . ALLERGIC RHINITIS 03/02/2007  . Anemia   . ANXIETY 03/02/2007  . Arthritis   . ASTHMA 11/12/2007   Dr Orvil Feil  . Cancer (HCC)    skin, hx of  . Dysrhythmia    pt. states at night will notice a different heart rhythem  . Endometrial cancer ([facility])   . FIBROCYSTIC BREAST DISEASE, HX OF 03/02/2007  . GERD 03/02/2007  . HYPOTHYROIDISM 11/12/2007   Dr Chalmers Cater  . Neuromuscular disorder (HCC)    carpel tunnel syndrome, compression of ulner nerve at elbows  . OSTEOPENIA 11/12/2007  .  PEPTIC ULCER DISEASE 03/02/2007  . Pneumonia    hx. of  . Radiation 09/27/15-10/23/15   HDR to vaginal cuff 30 Gy  . Stress fracture   . TMJ SYNDROME 11/12/2007   Past Surgical History:  Procedure Laterality Date  . 2008 TMJ surgery     . carpel tunnel Right 11/20/2016  . colonoscopy x 2    . cubital tunnel release Right 11/20/2016  . deviated septum surgery - 2007    . DILATION AND CURETTAGE OF UTERUS     2017  . endoscopy - 1990    . Mandib advancement forward  2009  . ROBOTIC ASSISTED TOTAL HYSTERECTOMY WITH BILATERAL SALPINGO OOPHERECTOMY Bilateral 08/14/2015   Procedure: XI ROBOTIC ASSISTED TOTAL HYSTERECTOMY WITH BILATERAL SALPINGO OOPHORECTOMY AND SENTAL LYMPH NODE BIOPSY;  Surgeon: Everitt Amber, MD;  Location: [facility];  Service: Gynecology;  Laterality: Bilateral;  . 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 Marital status: Married    Spouse name: Not on file  . Number of children: Not on file  . Years of education: Not on file  . Highest education level: Not on file  Occupational History  . Occupation: Community education officer at [employer] Use  . Smoking status: Never Smoker  . Smokeless tobacco: Never Used  . Tobacco comment: Married, 1 child  Vaping Use  .  Vaping Use: Never used  Substance and Sexual Activity  . Alcohol use: Yes    Alcohol/week: 2.0 standard drinks    Types: 1 Glasses of wine, 1 Cans of beer per week    Comment: daily  . Drug use: No  . Sexual activity: Yes  Other Topics Concern  . Not on file  Social History Narrative  . Not on file   Social Determinants of Health   Financial Resource Strain:   . Difficulty of Paying Living Expenses: Not on file  Food Insecurity:   . Worried About Charity fundraiser in the Last Year: Not on file  . Ran Out of Food in the Last Year: Not on file  Transportation Needs:   . Lack of Transportation (Medical): Not on file  . Lack of Transportation  (Non-Medical): Not on file  Physical Activity:   . Days of Exercise per Week: Not on file  . Minutes of Exercise per Session: Not on file  Stress:   . Feeling of Stress : Not on file  Social Connections:   . Frequency of Communication with Friends and Family: Not on file  . Frequency of Social Gatherings with Friends and Family: Not on file  . Attends Religious Services: Not on file  . Active Member of Clubs or Organizations: Not on file  . Attends Archivist Meetings: Not on file  . Marital Status: Not on file   Allergies  Allergen Reactions  . Astroglide [Gyne-Moistrin] Other (See Comments)    Itching and burning  . Colchicine     diarrhea  . Demerol Nausea And Vomiting  . Penicillins Hives and Other (See Comments)    Face turned red and had a headache. Has patient had a PCN reaction causing immediate rash, facial/tongue/throat swelling, SOB or lightheadedness with hypotension: no Has patient had a PCN reaction causing severe rash involving mucus membranes or skin necrosis: no Has patient had a PCN reaction that required hospitalization: no Has patient had a PCN reaction occurring within the last 10 years: no If all of the above answers are "NO", then may proceed with Cephalosporin use.  . Shellfish Allergy Other (See Comments)    Intolerance   Family History  Problem Relation Age of Onset  . Hypertension Father   . Heart disease Father 65       chf  . Heart disease Mother 55       CAD  . Hypertension Other   . Asthma Neg Hx     Current Outpatient Medications (Endocrine & Metabolic):  .  levothyroxine (SYNTHROID, LEVOTHROID) 25 MCG tablet, Take 25-50 mcg by mouth daily. She takes one tablet daily 4 days per week (Monday-Thursday) and two tablets daily 3 days per week (Friday-Sunday). Marland Kitchen  liothyronine (CYTOMEL) 5 MCG tablet, Take 5-10 mcg by mouth 2 (two) times daily. She takes one tablet in the morning and two tablets midday. .  methylPREDNISolone (MEDROL  DOSEPAK) 4 MG TBPK tablet, 24 mg PO on day 1, then decr. by 4 mg/day x5 days .  zoledronic acid (RECLAST) 5 MG/100ML SOLN injection, Inject 5 mg into the vein. Once a year  Current Outpatient Medications (Cardiovascular):  Marland Kitchen  EPIPEN 2-PAK 0.3 MG/0.3ML DEVI, Inject 0.3 mg into the muscle once. Reported on 09/03/2015  Current Outpatient Medications (Respiratory):  .  cetirizine (ZYRTEC) 5 MG tablet, Take 5 mg by mouth 2 (two) times daily as needed for allergies. .  fluticasone (FLONASE) 50 MCG/ACT nasal spray, Place 1  spray into the nose daily as needed for allergies. Reported on 08/06/2015  Current Outpatient Medications (Analgesics):  .  meloxicam (MOBIC) 7.5 MG tablet, Take 1 tablet (7.5 mg total) by mouth daily.   Current Outpatient Medications (Other):  .  azithromycin (ZITHROMAX) 250 MG tablet, Take two tabs the first day and then one tab daily for four days .  Calcium Citrate-Vitamin D (CALCIUM CITRATE +D PO), Take 2 tablets by mouth daily.  .  cholecalciferol (VITAMIN D) 1000 units tablet, Take 2,000 Units by mouth daily. Marland Kitchen  co-enzyme Q-10 30 MG capsule, Take 30 mg by mouth 3 (three) times daily. Marland Kitchen  estradiol (ESTRACE) 0.1 MG/GM vaginal cream, PLACE 1 APPLICATORFUL VAGINALLY 3 (THREE) TIMES A WEEK .  famotidine (PEPCID) 40 MG tablet, Take 1 tablet (40 mg total) by mouth daily. Marland Kitchen  gabapentin (NEURONTIN) 100 MG capsule, Take 2 capsules (200 mg total) by mouth at bedtime. Marland Kitchen  MELATONIN-CHAMOMILE PO, Take 1 capsule by mouth at bedtime as needed and may repeat dose one time if needed (For sleep.).  Marland Kitchen  METRONIDAZOLE, TOPICAL, 0.75 % LOTN, Apply 1 application topically at bedtime. .  minoxidil (ROGAINE) 2 % external solution, Apply 1 application topically 4 (four) times a week.  .  Multiple Vitamin (MULTIVITAMIN WITH MINERALS) TABS tablet, Take 1 tablet by mouth daily. .  Omega-3 Fatty Acids (FISH OIL) 1000 MG CAPS, Take 1 capsule by mouth daily. .  Pancrelipase, Lip-Prot-Amyl, (ZENPEP)  40000-126000 units CPEP, Take 1 capsule by mouth 3 (three) times daily with meals. .  pyridoxine (B-6) 200 MG tablet, Take 200 mg by mouth daily. .  TURMERIC PO, Take 1 capsule by mouth daily. Marland Kitchen  venlafaxine XR (EFFEXOR-XR) 37.5 MG 24 hr capsule, TAKE 1 CAPSULE BY MOUTH EVERY DAY WITH BREAKFAST .  vitamin C (ASCORBIC ACID) 500 MG tablet, Take 500 mg by mouth daily. .  vitamin E 400 UNIT capsule, Take 400 Units by mouth daily.   Reviewed prior external information including notes and imaging from  primary care provider As well as notes that were available from care everywhere and other healthcare systems.  Past medical history, social, surgical and family history all reviewed in electronic medical record.  No pertanent information unless stated regarding to the chief complaint.   Review of Systems:  No headache, visual changes, nausea, vomiting, diarrhea, constipation, dizziness, abdominal pain, skin rash, fevers, chills, night sweats, weight loss, swollen lymph nodes, body aches, joint swelling, chest pain, shortness of breath, mood changes. POSITIVE muscle aches  Objective  Blood pressure 118/70, pulse 70, height 5' 4.5" (1.638 m), weight 137 lb (62.1 kg), SpO2 96 %.   General: No apparent distress alert and oriented x3 mood and affect normal, dressed appropriately.  HEENT: Pupils equal, extraocular movements intact  Respiratory: Patient's speak in full sentences and does not appear short of breath  Cardiovascular: No lower extremity edema, non tender, no erythema  Neuro: Cranial nerves II through XII are intact, neurovascularly intact in all extremities with 2+ DTRs and 2+ pulses.  Gait antalgic.  MSK: Low back exam does have some mild loss of lordosis, tightness with Corky Sox on the left greater than right.  Negative straight leg test.  No tenderness in the piriformis and gluteal region.  No pain in the hamstring itself    Impression and Recommendations:     The above documentation  has been reviewed and is accurate and complete Lyndal Pulley, DO       Note: This dictation was  prepared with Dragon dictation along with smaller phrase technology. Any transcriptional errors that result from this process are unintentional.

## 2020-03-27 NOTE — Progress Notes (Signed)
Subjective:  Patient ID: [name], female    DOB: 1956-04-09  Age: 64 y.o. MRN: [id number]  CC: No chief complaint on file.   HPI [name] presents for GERD and worsening gastroparesis, heartburn - worse [name] is taking B6, CoQ10, MVI, Turmeric, fish oil and a few other supplements  Outpatient Medications Prior to Visit  Medication Sig Dispense Refill  . Calcium Citrate-Vitamin D (CALCIUM CITRATE +D PO) Take 2 tablets by mouth daily.     . cetirizine (ZYRTEC) 5 MG tablet Take 5 mg by mouth 2 (two) times daily as needed for allergies.    . cholecalciferol (VITAMIN D) 1000 units tablet Take 2,000 Units by mouth daily.    Marland Kitchen co-enzyme Q-10 30 MG capsule Take 30 mg by mouth 3 (three) times daily.    Marland Kitchen EPIPEN 2-PAK 0.3 MG/0.3ML DEVI Inject 0.3 mg into the muscle once. Reported on 09/03/2015    . estradiol (ESTRACE) 0.1 MG/GM vaginal cream PLACE 1 APPLICATORFUL VAGINALLY 3 (THREE) TIMES A WEEK 42.5 g 12  . famotidine (PEPCID) 20 MG tablet Take 1 tablet (20 mg total) by mouth daily as needed for heartburn or indigestion. Take with Meloxicam 30 tablet 2  . fluticasone (FLONASE) 50 MCG/ACT nasal spray Place 1 spray into the nose daily as needed for allergies. Reported on 08/06/2015    . gabapentin (NEURONTIN) 100 MG capsule Take 2 capsules (200 mg total) by mouth at bedtime. 180 capsule 0  . levothyroxine (SYNTHROID, LEVOTHROID) 25 MCG tablet Take 25-50 mcg by mouth daily. She takes one tablet daily 4 days per week (Monday-Thursday) and two tablets daily 3 days per week (Friday-Sunday).    Marland Kitchen liothyronine (CYTOMEL) 5 MCG tablet Take 5-10 mcg by mouth 2 (two) times daily. She takes one tablet in the morning and two tablets midday.    Marland Kitchen MELATONIN-CHAMOMILE PO Take 1 capsule by mouth at bedtime as needed and may repeat dose one time if needed (For sleep.).     Marland Kitchen METRONIDAZOLE, TOPICAL, 0.75 % LOTN Apply 1 application topically at bedtime.    . minoxidil (ROGAINE) 2 % external solution Apply  1 application topically 4 (four) times a week.     . Multiple Vitamin (MULTIVITAMIN WITH MINERALS) TABS tablet Take 1 tablet by mouth daily.    . Omega-3 Fatty Acids (FISH OIL) 1000 MG CAPS Take 1 capsule by mouth daily.    Marland Kitchen pyridoxine (B-6) 200 MG tablet Take 200 mg by mouth daily.    . TURMERIC PO Take 1 capsule by mouth daily.    Marland Kitchen venlafaxine XR (EFFEXOR-XR) 37.5 MG 24 hr capsule TAKE 1 CAPSULE BY MOUTH EVERY DAY WITH BREAKFAST 90 capsule 6  . vitamin C (ASCORBIC ACID) 500 MG tablet Take 500 mg by mouth daily.    . vitamin E 400 UNIT capsule Take 400 Units by mouth daily.    . zoledronic acid (RECLAST) 5 MG/100ML SOLN injection Inject 5 mg into the vein. Once a year    . azithromycin (ZITHROMAX) 250 MG tablet Take two tabs the first day and then one tab daily for four days (Patient not taking: Reported on 03/27/2020) 6 tablet 0  . meloxicam (MOBIC) 7.5 MG tablet Take 1 tablet (7.5 mg total) by mouth daily. (Patient not taking: Reported on 03/27/2020) 30 tablet 0  . methylPREDNISolone (MEDROL DOSEPAK) 4 MG TBPK tablet 24 mg PO on day 1, then decr. by 4 mg/day x5 days (Patient not taking: Reported on 03/27/2020) 21 tablet 0  No facility-administered medications prior to visit.    ROS: Review of Systems  Constitutional: Positive for fatigue. Negative for activity change, appetite change, chills and unexpected weight change.  HENT: Negative for congestion, mouth sores and sinus pressure.   Eyes: Negative for visual disturbance.  Respiratory: Negative for cough and chest tightness.   Gastrointestinal: Positive for abdominal distention and constipation. Negative for abdominal pain, anal bleeding, diarrhea, nausea and vomiting.  Genitourinary: Negative for difficulty urinating, frequency and vaginal pain.  Musculoskeletal: Positive for arthralgias. Negative for back pain and gait problem.  Skin: Negative for pallor and rash.  Neurological: Negative for dizziness, tremors, weakness, numbness and  headaches.  Psychiatric/Behavioral: Positive for dysphoric mood. Negative for confusion and sleep disturbance.    Objective:  BP 120/80 (BP Location: Left Arm, Patient Position: Sitting, Cuff Size: Normal)   Pulse 62   Temp 98.4 F (36.9 C) (Oral)   Ht 5' 4.5" (1.638 m)   Wt 136 lb (61.7 kg)   SpO2 97%   BMI 22.98 kg/m   BP Readings from Last 3 Encounters:  03/27/20 120/80  02/09/20 110/74  07/05/19 116/60    Wt Readings from Last 3 Encounters:  03/27/20 136 lb (61.7 kg)  02/09/20 134 lb (60.8 kg)  07/05/19 135 lb (61.2 kg)    Physical Exam Constitutional:      General: She is not in acute distress.    Appearance: She is well-developed.  HENT:     Head: Normocephalic.     Right Ear: External ear normal.     Left Ear: External ear normal.     Nose: Nose normal.  Eyes:     General:        Right eye: No discharge.        Left eye: No discharge.     Conjunctiva/sclera: Conjunctivae normal.     Pupils: Pupils are equal, round, and reactive to light.  Neck:     Thyroid: No thyromegaly.     Vascular: No JVD.     Trachea: No tracheal deviation.  Cardiovascular:     Rate and Rhythm: Normal rate and regular rhythm.     Heart sounds: Normal heart sounds.  Pulmonary:     Effort: No respiratory distress.     Breath sounds: No stridor. No wheezing.  Abdominal:     General: Bowel sounds are normal. There is no distension.     Palpations: Abdomen is soft. There is no mass.     Tenderness: There is no abdominal tenderness. There is no guarding or rebound.  Musculoskeletal:        General: Tenderness present.     Cervical back: Normal range of motion and neck supple.  Lymphadenopathy:     Cervical: No cervical adenopathy.  Skin:    Findings: No erythema or rash.  Neurological:     Cranial Nerves: No cranial nerve deficit.     Motor: No abnormal muscle tone.     Coordination: Coordination normal.     Deep Tendon Reflexes: Reflexes normal.  Psychiatric:         Behavior: Behavior normal.        Thought Content: Thought content normal.        Judgment: Judgment normal.     Lab Results  Component Value Date   WBC 3.8 (L) 07/08/2019   HGB 13.8 07/08/2019   HCT 41.2 07/08/2019   PLT 241.0 07/08/2019   GLUCOSE 97 07/08/2019   CHOL 257 (H) 07/08/2019   TRIG  81.0 07/08/2019   HDL 86.50 07/08/2019   LDLDIRECT 102.8 11/11/2010   LDLCALC 154 (H) 07/08/2019   ALT 19 07/08/2019   AST 23 07/08/2019   NA 138 07/08/2019   K 4.0 07/08/2019   CL 102 07/08/2019   CREATININE 0.75 07/08/2019   BUN 15 07/08/2019   CO2 31 07/08/2019   TSH 1.47 05/13/2017    CT CARDIAC SCORING  Addendum Date: 08/29/2019   ADDENDUM REPORT: 08/29/2019 19:27 CLINICAL DATA:  Risk stratification EXAM: Coronary Calcium Score TECHNIQUE: The patient was scanned on a [employer] scanner. Axial non-contrast 3 mm slices were carried out through the heart. The data set was analyzed on a dedicated work station and scored using the [place]. FINDINGS: Non-cardiac: See separate report from [facility]. Ascending Aorta: Normal Caliber. Minimal calcifications in the descending aorta. Pericardium: Normal Coronary arteries: Normal coronary origins. IMPRESSION: Coronary calcium score of 0. This was 0 percentile for age and sex matched control. Fransico Him Electronically Signed   By: Fransico Him   On: 08/29/2019 19:27   Result Date: 08/29/2019 EXAM: OVER-READ INTERPRETATION  CT CHEST The following report is an over-read performed by radiologist Dr. Rolm Baptise of [facility], [place] on 08/29/2019. This over-read does not include interpretation of cardiac or coronary anatomy or pathology. The coronary calcium score interpretation by the cardiologist is attached. COMPARISON:  None. FINDINGS: Vascular: Heart is normal size.  Visualized aorta normal caliber. Mediastinum/Nodes: No adenopathy in the lower mediastinum or hila. Lungs/Pleura: Visualized lungs clear.  No effusions. Upper  Abdomen: Imaging into the upper abdomen shows no acute findings. Musculoskeletal: Chest wall soft tissues are unremarkable. No acute bony abnormality. IMPRESSION: No acute or significant extracardiac abnormality. Electronically Signed: By: Rolm Baptise M.D. On: 08/29/2019 10:49    Assessment & Plan:    Walker Kehr, MD

## 2020-03-27 NOTE — Assessment & Plan Note (Addendum)
Fecal H pylori Ag Pepcid Zenpep Stop supplements

## 2020-03-27 NOTE — Patient Instructions (Addendum)
Good to see you Gabapentin 100mg  at night instead Keep doing the exercises Left hip xray today   See me again in 4-6 weeks and if still in pain we will discuss MRI of the back or hip

## 2020-03-27 NOTE — Assessment & Plan Note (Signed)
Discussed with patient in great length.  We discussed with patient's history of different medical issues that advanced imaging would be warranted.  Patient wants to hold at this time.  Patient still feels like it is more her hip.  Discussed the possible piriformis injection for diagnostic and therapeutic purposes which patient also declined.  Encourage patient to decrease the gabapentin to 100 mg from 200 mg.  Patient will do that and see if she still gets some benefit from the medicine without any GI side effects.  Total time with patient today as well as reviewing patient's chart on the date of service 36 minutes

## 2020-03-27 NOTE — Assessment & Plan Note (Signed)
Calcium citrate

## 2020-04-06 ENCOUNTER — Telehealth: Payer: Self-pay | Admitting: Internal Medicine

## 2020-04-06 NOTE — Telephone Encounter (Signed)
    Patient requesting procedure code and estimate cost of Helicobacter pylori antigen test ordered by Dr Alain Marion She is unsure if her insurance will cover test. Would you have any information on this?

## 2020-04-30 ENCOUNTER — Telehealth: Payer: Self-pay | Admitting: *Deleted

## 2020-04-30 NOTE — Telephone Encounter (Signed)
Returned the patient's call and scheduled a follow up appt for Dec 2nd at 3 pm with Dr Denman George

## 2020-05-02 ENCOUNTER — Encounter: Payer: Self-pay | Admitting: Family Medicine

## 2020-05-02 ENCOUNTER — Other Ambulatory Visit: Payer: Self-pay

## 2020-05-02 ENCOUNTER — Ambulatory Visit (INDEPENDENT_AMBULATORY_CARE_PROVIDER_SITE_OTHER): Payer: BC Managed Care – PPO | Admitting: Family Medicine

## 2020-05-02 VITALS — BP 130/90 | HR 69 | Ht 64.5 in | Wt 131.0 lb

## 2020-05-02 DIAGNOSIS — G8929 Other chronic pain: Secondary | ICD-10-CM

## 2020-05-02 DIAGNOSIS — M5442 Lumbago with sciatica, left side: Secondary | ICD-10-CM

## 2020-05-02 DIAGNOSIS — M2550 Pain in unspecified joint: Secondary | ICD-10-CM | POA: Diagnosis not present

## 2020-05-02 LAB — C-REACTIVE PROTEIN: CRP: <1 mg/dL (ref 0.5–20.0)

## 2020-05-02 LAB — SEDIMENTATION RATE: Sed Rate: 4 mm/hr (ref 0–30)

## 2020-05-02 LAB — CBC WITH DIFFERENTIAL/PLATELET
Basophils Absolute: 0 10*3/uL (ref 0.0–0.1)
Basophils Relative: 0.5 % (ref 0.0–3.0)
Eosinophils Absolute: 0.1 10*3/uL (ref 0.0–0.7)
Eosinophils Relative: 3.1 % (ref 0.0–5.0)
HCT: 40.9 % (ref 36.0–46.0)
Hemoglobin: 14 g/dL (ref 12.0–15.0)
Lymphocytes Relative: 37.3 % (ref 12.0–46.0)
Lymphs Abs: 1.3 10*3/uL (ref 0.7–4.0)
MCHC: 34.1 g/dL (ref 30.0–36.0)
MCV: 91.1 fl (ref 78.0–100.0)
Monocytes Absolute: 0.3 10*3/uL (ref 0.1–1.0)
Monocytes Relative: 8 % (ref 3.0–12.0)
Neutro Abs: 1.7 10*3/uL (ref 1.4–7.7)
Neutrophils Relative %: 51.1 % (ref 43.0–77.0)
Platelets: 248 10*3/uL (ref 150.0–400.0)
RBC: 4.48 Mil/uL (ref 3.87–5.11)
RDW: 13.2 % (ref 11.5–15.5)
WBC: 3.4 10*3/uL — ABNORMAL LOW (ref 4.0–10.5)

## 2020-05-02 LAB — IBC PANEL
Iron: 120 ug/dL (ref 42–145)
Saturation Ratios: 36 % (ref 20.0–50.0)
Transferrin: 238 mg/dL (ref 212.0–360.0)

## 2020-05-02 LAB — COMPREHENSIVE METABOLIC PANEL
ALT: 17 U/L (ref 0–35)
AST: 21 U/L (ref 0–37)
Albumin: 4.3 g/dL (ref 3.5–5.2)
Alkaline Phosphatase: 63 U/L (ref 39–117)
BUN: 16 mg/dL (ref 6–23)
CO2: 30 mEq/L (ref 19–32)
Calcium: 9.1 mg/dL (ref 8.4–10.5)
Chloride: 102 mEq/L (ref 96–112)
Creatinine, Ser: 0.73 mg/dL (ref 0.40–1.20)
GFR: 87.51 mL/min (ref >60.00)
Glucose, Bld: 93 mg/dL (ref 70–99)
Potassium: 4.2 mEq/L (ref 3.5–5.1)
Sodium: 138 mEq/L (ref 135–145)
Total Bilirubin: 0.6 mg/dL (ref 0.2–1.2)
Total Protein: 6.6 g/dL (ref 6.0–8.3)

## 2020-05-02 LAB — TSH: TSH: 0.47 u[IU]/mL (ref 0.35–4.50)

## 2020-05-02 LAB — URIC ACID: Uric Acid, Serum: 4.4 mg/dL (ref 2.4–7.0)

## 2020-05-02 LAB — VITAMIN D 25 HYDROXY (VIT D DEFICIENCY, FRACTURES): VITD: 52.09 ng/mL (ref 30.00–100.00)

## 2020-05-02 NOTE — Patient Instructions (Addendum)
Labs today Will need to consider MRI lumbar and pelvis w and wo Keep doing exercises Hold on meds until we know more

## 2020-05-02 NOTE — Assessment & Plan Note (Signed)
Lower back pain.  Seems to be out of proportion.  Does have a history of endometrial carcinoma.  At this point I would like laboratory work-up to further evaluate to see if anything else that would be contributing.  Patient's x-rays were fairly unremarkable including the hip as well as the back.  Patient still pain seems to be severe.  We discussed again about the potential for a piriformis injection for diagnostic and therapeutic purposes but patient as well as myself feels like we should hold at this moment.  Depending on laboratory work-up then we will discuss either the injection versus the advanced imaging.  Patient is in agreement with the plan.

## 2020-05-02 NOTE — Progress Notes (Signed)
[facility] [address] [place] Phone: [phone] Subjective:   I Kandace Blitz am serving as a Education administrator for Dr. Hulan Saas.  This visit occurred during the SARS-CoV-2 public health emergency.  Safety protocols were in place, including screening questions prior to the visit, additional usage of staff PPE, and extensive cleaning of exam room while observing appropriate contact time as indicated for disinfecting solutions.   I'm seeing this patient by the request  of:  Plotnikov, [name], MD  CC: Low back and hip pain follow-up  NOB:[id number]   03/27/2020 Discussed with patient in great length.  We discussed with patient's history of different medical issues that advanced imaging would be warranted.  Patient wants to hold at this time.  Patient still feels like it is more her hip.  Discussed the possible piriformis injection for diagnostic and therapeutic purposes which patient also declined.  Encourage patient to decrease the gabapentin to 100 mg from 200 mg.  Patient will do that and see if she still gets some benefit from the medicine without any GI side effects.  Total time with patient today as well as reviewing patient's chart on the date of service 36 minutes   Update 05/02/2020 KENESHIA TENA is a 64 y.o. female coming in with complaint of low back and left hip pain. Patient states in the mornings she is worse. 10/10 in the hip and low back. States her pelvis on the left side is painful. During the day her hip hurts. Stopped taking gabapentin because it was not working and caused dizziness.    Patient did have x-rays at last exam.  X-rays of the hip were completely unremarkable x-rays of the lumbar spine which showed mild spondylosis.  These were independently visualized by me    Past Medical History:  Diagnosis Date  . ALLERGIC RHINITIS 03/02/2007  . Anemia   . ANXIETY 03/02/2007  . Arthritis   . ASTHMA 11/12/2007   Dr Orvil Feil  .  Cancer (HCC)    skin, hx of  . Dysrhythmia    pt. states at night will notice a different heart rhythem  . Endometrial cancer ([place])   . FIBROCYSTIC BREAST DISEASE, HX OF 03/02/2007  . GERD 03/02/2007  . HYPOTHYROIDISM 11/12/2007   Dr Chalmers Cater  . Neuromuscular disorder (HCC)    carpel tunnel syndrome, compression of ulner nerve at elbows  . OSTEOPENIA 11/12/2007  . PEPTIC ULCER DISEASE 03/02/2007  . Pneumonia    hx. of  . Radiation 09/27/15-10/23/15   HDR to vaginal cuff 30 Gy  . Stress fracture   . TMJ SYNDROME 11/12/2007   Past Surgical History:  Procedure Laterality Date  . 2008 TMJ surgery     . carpel tunnel Right 11/20/2016  . colonoscopy x 2    . cubital tunnel release Right 11/20/2016  . deviated septum surgery - 2007    . DILATION AND CURETTAGE OF UTERUS     2017  . endoscopy - 1990    . Mandib advancement forward  2009  . ROBOTIC ASSISTED TOTAL HYSTERECTOMY WITH BILATERAL SALPINGO OOPHERECTOMY Bilateral 08/14/2015   Procedure: XI ROBOTIC ASSISTED TOTAL HYSTERECTOMY WITH BILATERAL SALPINGO OOPHORECTOMY AND SENTAL LYMPH NODE BIOPSY;  Surgeon: Everitt Amber, MD;  Location: [facility];  Service: Gynecology;  Laterality: Bilateral;  . 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 Marital status: Married    Spouse name:  Not on file  . Number of children: Not on file  . Years of education: Not on file  . Highest education level: Not on file  Occupational History  . Occupation: Community education officer at [employer] Use  . Smoking status: Never Smoker  . Smokeless tobacco: Never Used  . Tobacco comment: Married, 1 child  Vaping Use  . Vaping Use: Never used  Substance and Sexual Activity  . Alcohol use: Yes    Alcohol/week: 2.0 standard drinks    Types: 1 Glasses of wine, 1 Cans of beer per week    Comment: daily  . Drug use: No  . Sexual activity: Yes  Other Topics Concern  . Not on file  Social History Narrative  . Not  on file   Social Determinants of Health   Financial Resource Strain:   . Difficulty of Paying Living Expenses: Not on file  Food Insecurity:   . Worried About Charity fundraiser in the Last Year: Not on file  . Ran Out of Food in the Last Year: Not on file  Transportation Needs:   . Lack of Transportation (Medical): Not on file  . Lack of Transportation (Non-Medical): Not on file  Physical Activity:   . Days of Exercise per Week: Not on file  . Minutes of Exercise per Session: Not on file  Stress:   . Feeling of Stress : Not on file  Social Connections:   . Frequency of Communication with Friends and Family: Not on file  . Frequency of Social Gatherings with Friends and Family: Not on file  . Attends Religious Services: Not on file  . Active Member of Clubs or Organizations: Not on file  . Attends Archivist Meetings: Not on file  . Marital Status: Not on file   Allergies  Allergen Reactions  . Astroglide [Gyne-Moistrin] Other (See Comments)    Itching and burning  . Colchicine     diarrhea  . Demerol Nausea And Vomiting  . Penicillins Hives and Other (See Comments)    Face turned red and had a headache. Has patient had a PCN reaction causing immediate rash, facial/tongue/throat swelling, SOB or lightheadedness with hypotension: no Has patient had a PCN reaction causing severe rash involving mucus membranes or skin necrosis: no Has patient had a PCN reaction that required hospitalization: no Has patient had a PCN reaction occurring within the last 10 years: no If all of the above answers are "NO", then may proceed with Cephalosporin use.  . Shellfish Allergy Other (See Comments)    Intolerance   Family History  Problem Relation Age of Onset  . Hypertension Father   . Heart disease Father 60       chf  . Heart disease Mother 49       CAD  . Hypertension Other   . Asthma Neg Hx     Current Outpatient Medications (Endocrine & Metabolic):  .  levothyroxine  (SYNTHROID, LEVOTHROID) 25 MCG tablet, Take 25-50 mcg by mouth daily. She takes one tablet daily 4 days per week (Monday-Thursday) and two tablets daily 3 days per week (Friday-Sunday). Marland Kitchen  liothyronine (CYTOMEL) 5 MCG tablet, Take 5-10 mcg by mouth 2 (two) times daily. She takes one tablet in the morning and two tablets midday. .  methylPREDNISolone (MEDROL DOSEPAK) 4 MG TBPK tablet, 24 mg PO on day 1, then decr. by 4 mg/day x5 days .  zoledronic acid (RECLAST) 5 MG/100ML SOLN injection, Inject 5 mg into  the vein. Once a year  Current Outpatient Medications (Cardiovascular):  Marland Kitchen  EPIPEN 2-PAK 0.3 MG/0.3ML DEVI, Inject 0.3 mg into the muscle once. Reported on 09/03/2015  Current Outpatient Medications (Respiratory):  .  cetirizine (ZYRTEC) 5 MG tablet, Take 5 mg by mouth 2 (two) times daily as needed for allergies. .  fluticasone (FLONASE) 50 MCG/ACT nasal spray, Place 1 spray into the nose daily as needed for allergies. Reported on 08/06/2015  Current Outpatient Medications (Analgesics):  .  meloxicam (MOBIC) 7.5 MG tablet, Take 1 tablet (7.5 mg total) by mouth daily.   Current Outpatient Medications (Other):  .  azithromycin (ZITHROMAX) 250 MG tablet, Take two tabs the first day and then one tab daily for four days .  Calcium Citrate-Vitamin D (CALCIUM CITRATE +D PO), Take 2 tablets by mouth daily.  .  cholecalciferol (VITAMIN D) 1000 units tablet, Take 2,000 Units by mouth daily. Marland Kitchen  co-enzyme Q-10 30 MG capsule, Take 30 mg by mouth 3 (three) times daily. Marland Kitchen  estradiol (ESTRACE) 0.1 MG/GM vaginal cream, PLACE 1 APPLICATORFUL VAGINALLY 3 (THREE) TIMES A WEEK .  famotidine (PEPCID) 40 MG tablet, Take 1 tablet (40 mg total) by mouth daily. Marland Kitchen  gabapentin (NEURONTIN) 100 MG capsule, Take 2 capsules (200 mg total) by mouth at bedtime. Marland Kitchen  MELATONIN-CHAMOMILE PO, Take 1 capsule by mouth at bedtime as needed and may repeat dose one time if needed (For sleep.).  Marland Kitchen  METRONIDAZOLE, TOPICAL, 0.75 % LOTN,  Apply 1 application topically at bedtime. .  minoxidil (ROGAINE) 2 % external solution, Apply 1 application topically 4 (four) times a week.  .  Multiple Vitamin (MULTIVITAMIN WITH MINERALS) TABS tablet, Take 1 tablet by mouth daily. .  Omega-3 Fatty Acids (FISH OIL) 1000 MG CAPS, Take 1 capsule by mouth daily. .  Pancrelipase, Lip-Prot-Amyl, (ZENPEP) 40000-126000 units CPEP, Take 1 capsule by mouth 3 (three) times daily with meals. .  pyridoxine (B-6) 200 MG tablet, Take 200 mg by mouth daily. .  TURMERIC PO, Take 1 capsule by mouth daily. Marland Kitchen  venlafaxine XR (EFFEXOR-XR) 37.5 MG 24 hr capsule, TAKE 1 CAPSULE BY MOUTH EVERY DAY WITH BREAKFAST .  vitamin C (ASCORBIC ACID) 500 MG tablet, Take 500 mg by mouth daily. .  vitamin E 400 UNIT capsule, Take 400 Units by mouth daily.   Reviewed prior external information including notes and imaging from  primary care provider As well as notes that were available from care everywhere and other healthcare systems.  Past medical history, social, surgical and family history all reviewed in electronic medical record.  No pertanent information unless stated regarding to the chief complaint.   Review of Systems:  No headache, visual changes, nausea, vomiting, diarrhea, constipation, dizziness,  skin rash, fevers, chills, night sweats, weight loss, swollen lymph nodes, , joint swelling, chest pain, shortness of breath, mood changes. POSITIVE muscle aches, body aches, bowel changes  Objective  Blood pressure 130/90, pulse 69, height 5' 4.5" (1.638 m), weight 131 lb (59.4 kg), SpO2 98 %.   General: No apparent distress alert and oriented x3 mood and affect normal, dressed appropriately.  HEENT: Pupils equal, extraocular movements intact  Respiratory: Patient's speak in full sentences and does not appear short of breath  Cardiovascular: No lower extremity edema, non tender, no erythema    MSK: Patient still has some mild pain with the FABER test on the left.   Mild diffuse tenderness in the paraspinal musculature of the lumbar spine.  No fullness of the  abdomen noted.  Neurovascularly intact distally.    Impression and Recommendations:     The above documentation has been reviewed and is accurate and complete Lyndal Pulley, DO

## 2020-05-04 LAB — CEA: CEA: <0.5 ng/mL

## 2020-05-04 LAB — CYCLIC CITRUL PEPTIDE ANTIBODY, IGG: Cyclic Citrullin Peptide Ab: <16 UNITS

## 2020-05-04 LAB — CALCIUM, IONIZED: Calcium, Ion: 5.1 mg/dL (ref 4.8–5.6)

## 2020-05-04 LAB — CA 125: CA 125: 8 U/mL (ref <35)

## 2020-05-04 LAB — PTH, INTACT AND CALCIUM
Calcium: 9.4 mg/dL (ref 8.6–10.4)
PTH: 27 pg/mL (ref 14–64)

## 2020-05-04 LAB — ANA: Anti Nuclear Antibody (ANA): NEGATIVE

## 2020-05-04 LAB — RHEUMATOID FACTOR: Rheumatoid fact SerPl-aCnc: <14 IU/mL (ref <14)

## 2020-05-04 LAB — ANGIOTENSIN CONVERTING ENZYME: Angiotensin-Converting Enzyme: 19 U/L (ref 9–67)

## 2020-05-11 ENCOUNTER — Encounter: Payer: Self-pay | Admitting: Family Medicine

## 2020-05-14 ENCOUNTER — Other Ambulatory Visit: Payer: Self-pay

## 2020-05-14 ENCOUNTER — Other Ambulatory Visit: Payer: Self-pay | Admitting: Family Medicine

## 2020-05-14 DIAGNOSIS — M25552 Pain in left hip: Secondary | ICD-10-CM

## 2020-05-14 DIAGNOSIS — M545 Low back pain, unspecified: Secondary | ICD-10-CM

## 2020-05-14 NOTE — Progress Notes (Unsigned)
MRI p

## 2020-05-30 ENCOUNTER — Encounter: Payer: Self-pay | Admitting: Gynecologic Oncology

## 2020-05-31 ENCOUNTER — Inpatient Hospital Stay: Payer: BC Managed Care – PPO | Attending: Gynecologic Oncology | Admitting: Gynecologic Oncology

## 2020-05-31 ENCOUNTER — Encounter: Payer: Self-pay | Admitting: Gynecologic Oncology

## 2020-05-31 ENCOUNTER — Other Ambulatory Visit: Payer: Self-pay

## 2020-05-31 VITALS — BP 120/55 | HR 66 | Temp 96.9°F | Resp 18 | Ht 64.5 in | Wt 133.6 lb

## 2020-05-31 DIAGNOSIS — Z08 Encounter for follow-up examination after completed treatment for malignant neoplasm: Secondary | ICD-10-CM | POA: Diagnosis not present

## 2020-05-31 DIAGNOSIS — R232 Flushing: Secondary | ICD-10-CM | POA: Insufficient documentation

## 2020-05-31 DIAGNOSIS — Z90710 Acquired absence of both cervix and uterus: Secondary | ICD-10-CM | POA: Insufficient documentation

## 2020-05-31 DIAGNOSIS — Z90722 Acquired absence of ovaries, bilateral: Secondary | ICD-10-CM | POA: Insufficient documentation

## 2020-05-31 DIAGNOSIS — Z923 Personal history of irradiation: Secondary | ICD-10-CM | POA: Insufficient documentation

## 2020-05-31 DIAGNOSIS — C541 Malignant neoplasm of endometrium: Secondary | ICD-10-CM

## 2020-05-31 DIAGNOSIS — Z8542 Personal history of malignant neoplasm of other parts of uterus: Secondary | ICD-10-CM

## 2020-05-31 DIAGNOSIS — R102 Pelvic and perineal pain: Secondary | ICD-10-CM

## 2020-05-31 NOTE — Progress Notes (Signed)
FOLLOW-UP ENDOMETRIAL CANCER  Assessment and Plan:    64 y.o. year old with Stage IA Grade 2 endometrioid endometrial cancer.   S/p robotic total hysterectomy, BSO, sentinel lymph node biopsy on 08/14/15. Positive LVSI, 12% myometrial invasion, questionably positive pelvic washings (atypical cells) and negative lymph nodes. Adjuvant vaginal radiation completed in April, 2017.   1/ history of endometrial cancer: No sign of recurrence Provided up coming imaging is negative for recurrence, she will follow-up with Dr Helane Rima in [name] new year and then annually thereafter with Dr Helane Rima.   2/ Genital atrophy of menopause: continue vaginal estrace three times weekly.   3/ Hot flashes: continue effexor.   4/ Left pelvic pain: concern for occult recurrence in [name] left lymph node chain (pelvic). Will follow-up her scans from 12/14. If these fail to capture [name] iliac nodes, would recommend CT abdo/pelvis.   HPI:  [name] is a 64 y.o. year old G0 initially seen in consultation in February 2016 referred by Dr Helane Rima for grade 2 endometrial cancer. Her only risk factors for this are [name] exogenous compounded hormones that she took for menopause and her history of nulliparity.  She then underwent a robotic total hysterectomy on 09/21/38 without complications.  Her postoperative course was uncomplicated.  Her final pathologic diagnosis is a Stage IA Grade 2 endometrioid endometrial cancer with positive lymphovascular space invasion, 2/18 mm (12%) of myometrial invasion and negative lymph nodes. Washings showed atypical cells but these were inconclusive for being malignant.  Due to high/intermediate risk factors she received vaginal brachytherapy (30 Gy in 5 fractions) between 09/27/15 to 10/23/15.  Interval Hx: She is seen today for a routine surveillance visit. She is reporting pelvic pain on [name] left, worse with constipation/bowel movement and psoas muscle.  She is seeing a sports medicine doctor for this pain  and has an MRI scheduled for June 12, 2020.  Past Medical History:  Diagnosis Date  . ALLERGIC RHINITIS 03/02/2007  . Anemia   . ANXIETY 03/02/2007  . Arthritis   . ASTHMA 11/12/2007   Dr Orvil Feil  . Cancer (HCC)    skin, hx of  . Dysrhythmia    pt. states at night will notice a different heart rhythem  . Endometrial cancer ([place])   . FIBROCYSTIC BREAST DISEASE, HX OF 03/02/2007  . GERD 03/02/2007  . HYPOTHYROIDISM 11/12/2007   Dr Chalmers Cater  . Neuromuscular disorder (HCC)    carpel tunnel syndrome, compression of ulner nerve at elbows  . OSTEOPENIA 11/12/2007  . PEPTIC ULCER DISEASE 03/02/2007  . Pneumonia    hx. of  . Radiation 09/27/15-10/23/15   HDR to vaginal cuff 30 Gy  . Stress fracture   . TMJ SYNDROME 11/12/2007   Past Surgical History:  Procedure Laterality Date  . 2008 TMJ surgery     . CARPAL TUNNEL WITH CUBITAL TUNNEL Left 07/2019  . carpel tunnel Right 11/20/2016  . colonoscopy x 2    . cubital tunnel release Right 11/20/2016  . deviated septum surgery - 2007    . DILATION AND CURETTAGE OF UTERUS     2017  . endoscopy - 1990    . Mandib advancement forward  2009  . ROBOTIC ASSISTED TOTAL HYSTERECTOMY WITH BILATERAL SALPINGO OOPHERECTOMY Bilateral 08/14/2015   Procedure: XI ROBOTIC ASSISTED TOTAL HYSTERECTOMY WITH BILATERAL SALPINGO OOPHORECTOMY AND SENTAL LYMPH NODE BIOPSY;  Surgeon: Everitt Amber, MD;  Location: [facility];  Service: Gynecology;  Laterality: Bilateral;  . several skin excisions for basil and squamous cell  cancer     Family History  Problem Relation Age of Onset  . Hypertension Father   . Heart disease Father 61       chf  . Heart disease Mother 57       CAD  . Hypertension Other   . Asthma Neg Hx    Social History   Socioeconomic History  . Marital status: Married    Spouse name: Not on file  . Number of children: Not on file  . Years of education: Not on file  . Highest education level: Not on file  Occupational History  . Occupation: Therapist, music at [employer] Use  . Smoking status: Never Smoker  . Smokeless tobacco: Never Used  . Tobacco comment: Married, 1 child  Vaping Use  . Vaping Use: Never used  Substance and Sexual Activity  . Alcohol use: Yes    Alcohol/week: 2.0 standard drinks    Types: 1 Glasses of wine, 1 Cans of beer per week    Comment: daily  . Drug use: No  . Sexual activity: Yes  Other Topics Concern  . Not on file  Social History Narrative  . Not on file   Social Determinants of Health   Financial Resource Strain:   . Difficulty of Paying Living Expenses: Not on file  Food Insecurity:   . Worried About Charity fundraiser in [name] Last Year: Not on file  . Ran Out of Food in [name] Last Year: Not on file  Transportation Needs:   . Lack of Transportation (Medical): Not on file  . Lack of Transportation (Non-Medical): Not on file  Physical Activity:   . Days of Exercise per Week: Not on file  . Minutes of Exercise per Session: Not on file  Stress:   . Feeling of Stress : Not on file  Social Connections:   . Frequency of Communication with Friends and Family: Not on file  . Frequency of Social Gatherings with Friends and Family: Not on file  . Attends Religious Services: Not on file  . Active Member of Clubs or Organizations: Not on file  . Attends Archivist Meetings: Not on file  . Marital Status: Not on file  Intimate Partner Violence:   . Fear of Current or Ex-Partner: Not on file  . Emotionally Abused: Not on file  . Physically Abused: Not on file  . Sexually Abused: Not on file   Current Outpatient Medications on File Prior to Visit  Medication Sig Dispense Refill  . Calcium Citrate-Vitamin D (CALCIUM CITRATE +D PO) Take 2 tablets by mouth daily.     Marland Kitchen estradiol (ESTRACE) 0.1 MG/GM vaginal cream PLACE 1 APPLICATORFUL VAGINALLY 3 (THREE) TIMES A WEEK 42.5 g 12  . famotidine (PEPCID) 40 MG tablet Take 1 tablet (40 mg total) by mouth daily. 90 tablet 3  . fluticasone  (FLONASE) 50 MCG/ACT nasal spray Place 1 spray into [name] nose daily as needed for allergies. Reported on 08/06/2015    . levothyroxine (SYNTHROID, LEVOTHROID) 25 MCG tablet Take 25-50 mcg by mouth daily. She takes one tablet daily 4 days per week (Monday-Thursday) and two tablets daily 3 days per week (Friday-Sunday).    Marland Kitchen liothyronine (CYTOMEL) 5 MCG tablet Take 5-10 mcg by mouth 2 (two) times daily. She takes one tablet in [name] morning and two tablets midday.    Marland Kitchen MELATONIN-CHAMOMILE PO Take 1 capsule by mouth at bedtime as needed and may repeat dose one time if  needed (For sleep.).     Marland Kitchen METRONIDAZOLE, TOPICAL, 0.75 % LOTN Apply 1 application topically at bedtime.    . minoxidil (ROGAINE) 2 % external solution Apply 1 application topically 4 (four) times a week.     Marland Kitchen OVER [name] COUNTER MEDICATION Lion's Mane Extract 250 mg. 1 capsuleby mouth daily    . OVER [name] COUNTER MEDICATION Oreganol 140 mg by mouth daily.    Marland Kitchen OVER [name] COUNTER MEDICATION Nature's Made Advanced Dual Action Digestive probiotic  Takes 1 tablet daily    . Pancrelipase, Lip-Prot-Amyl, (ZENPEP) 40000-126000 units CPEP Take 1 capsule by mouth 3 (three) times daily with meals. 90 capsule 5  . pyridoxine (B-6) 200 MG tablet Take 200 mg by mouth daily.    Marland Kitchen venlafaxine XR (EFFEXOR-XR) 37.5 MG 24 hr capsule TAKE 1 CAPSULE BY MOUTH EVERY DAY WITH BREAKFAST (Patient taking differently: Take 37.5 mg by mouth. 3 times a week) 90 capsule 6  . cetirizine (ZYRTEC) 5 MG tablet Take 5 mg by mouth 2 (two) times daily as needed for allergies. (Patient not taking: Reported on 05/30/2020)    . EPIPEN 2-PAK 0.3 MG/0.3ML DEVI Inject 0.3 mg into [name] muscle once. Reported on 09/03/2015 (Patient not taking: Reported on 05/30/2020)    . Multiple Vitamin (MULTIVITAMIN WITH MINERALS) TABS tablet Take 1 tablet by mouth daily. (Patient not taking: Reported on 05/30/2020)    . zoledronic acid (RECLAST) 5 MG/100ML SOLN injection Inject 5 mg into [name] vein. Once a year  (Patient not taking: Reported on 05/30/2020)    . [DISCONTINUED] escitalopram (LEXAPRO) 10 MG tablet Take 10 mg by mouth daily.       No current facility-administered medications on file prior to visit.   Allergies  Allergen Reactions  . Ibuprofen Other (See Comments)    Gastritis   . Shellfish Allergy Other (See Comments)    Abdominal pain and Diarrhea  . Colchicine     diarrhea  . Astroglide [Gyne-Moistrin] Other (See Comments)    Itching and burning  . Demerol Nausea And Vomiting  . Penicillins Hives and Other (See Comments)    Face turned red and had a headache. Has patient had a PCN reaction causing immediate rash, facial/tongue/throat swelling, SOB or lightheadedness with hypotension: no Has patient had a PCN reaction causing severe rash involving mucus membranes or skin necrosis: no Has patient had a PCN reaction that required hospitalization: no Has patient had a PCN reaction occurring within [name] last 10 years: no If all of [name] above answers are "NO", then may proceed with Cephalosporin use.     Review of systems: Constitutional:  She has no weight gain or weight loss. She has no fever or chills. Eyes: No blurred vision Ears, Nose, Mouth, Throat: No dizziness, headaches or changes in hearing. No mouth sores. Cardiovascular: No chest pain, palpitations or edema. Respiratory:  No shortness of breath, wheezing or cough Gastrointestinal: She denies diarrhea. + constipation. She denies any nausea or vomiting. She denies blood in her stool or heart burn. Genitourinary:  She denies pelvic pain, pelvic pressure or changes in her urinary function. She has no hematuria, dysuria. + incontinence. She has no irregular vaginal bleeding. +vaginal discharge + genital atrophy Musculoskeletal: Denies muscle weakness or joint pains.  Skin:  She has no skin changes, rashes or itching Neurological:  Denies dizziness or headaches. No neuropathy, no numbness or tingling. Psychiatric:  She  denies depression or anxiety. Hematologic/Lymphatic:   No easy bruising or bleeding   Physical  Exam: Blood pressure (!) 120/55, pulse 66, temperature (!) 96.9 F (36.1 C), temperature source Tympanic, resp. rate 18, height 5' 4.5" (1.638 m), weight 133 lb 9.6 oz (60.6 kg), SpO2 100 %. General: Well dressed, well nourished in no apparent distress.   HEENT:  Normocephalic and atraumatic, no lesions.  Extraocular muscles intact. Sclerae anicteric. Pupils equal, round, reactive. No mouth sores or ulcers. Thyroid is normal size, not nodular, midline. Abdomen:  Soft, nondistended.  No palpable masses.  No hepatosplenomegaly.  No ascites. Deep palpation overlying [name] psoas muscle/sigmoid is tender but no palpable abnormalities. Genitourinary: Normal EGBUS  Vaginal cuff intact.  Atrophy with decreased lubrication and rugation. No bleeding.  No cul de sac fullness. Extremities: No cyanosis, clubbing or edema.  No calf tenderness or erythema. No palpable cords. Psychiatric: Mood and affect are appropriate. Neurological: Awake, alert and oriented x 3. Sensation is intact, no neuropathy.  Musculoskeletal: No pain, normal strength and range of motion.   Thereasa Solo, MD

## 2020-05-31 NOTE — Patient Instructions (Signed)
Dr Denman George will check your MRI on 12/14. If this does not evaluate the tissues she needs to see, she will order a CT abd/pelvis.  If the imaging looks reassuring for no evidence of recurrence, follow-up with Dr Helane Rima as scheduled and then annually thereafter.  Call Dr Serita Grit office at [phone] with questions.

## 2020-06-02 ENCOUNTER — Other Ambulatory Visit: Payer: BC Managed Care – PPO

## 2020-06-12 ENCOUNTER — Ambulatory Visit
Admission: RE | Admit: 2020-06-12 | Discharge: 2020-06-12 | Disposition: A | Discharge status: Home / Self Care | Payer: BC Managed Care – PPO | Source: Ambulatory Visit | Attending: Family Medicine | Admitting: Family Medicine

## 2020-06-12 ENCOUNTER — Other Ambulatory Visit: Payer: BC Managed Care – PPO

## 2020-06-12 ENCOUNTER — Inpatient Hospital Stay
Admission: RE | Admit: 2020-06-12 | Discharge: 2020-06-12 | Disposition: A | Discharge status: Home / Self Care | Payer: BC Managed Care – PPO | Source: Ambulatory Visit | Attending: Family Medicine | Admitting: Family Medicine

## 2020-06-12 DIAGNOSIS — K5900 Constipation, unspecified: Secondary | ICD-10-CM | POA: Diagnosis not present

## 2020-06-12 DIAGNOSIS — Z8542 Personal history of malignant neoplasm of other parts of uterus: Secondary | ICD-10-CM | POA: Diagnosis not present

## 2020-06-12 DIAGNOSIS — M48061 Spinal stenosis, lumbar region without neurogenic claudication: Secondary | ICD-10-CM | POA: Diagnosis not present

## 2020-06-12 DIAGNOSIS — R102 Pelvic and perineal pain: Secondary | ICD-10-CM | POA: Diagnosis not present

## 2020-06-12 DIAGNOSIS — Z90710 Acquired absence of both cervix and uterus: Secondary | ICD-10-CM | POA: Diagnosis not present

## 2020-06-12 DIAGNOSIS — M25552 Pain in left hip: Secondary | ICD-10-CM

## 2020-06-12 DIAGNOSIS — M545 Low back pain, unspecified: Secondary | ICD-10-CM

## 2020-06-12 IMAGING — MR MR [PERSON_NAME] PELVIS W/CM
9 of 15 series · 24 of 48 positions shown · IV contrast (12ml Multihance)
Comparison: CT abdomen and pelvis dated [DATE]
COMPARISON: CT abdomen and pelvis dated [DATE]

Addendum:
CLINICAL DATA: History of endometrial cancer with pelvic pain on
the LEFT worse with with bowel movements that reported history of
constipation

EXAM:
MR ABDOMEN AND PELVIS WITHOUT AND WITH CONTRAST (MR ENTEROGRAPHY)
TECHNIQUE: Multiplanar, multisequence MRI of the abdomen and pelvis was
performed both before and during bolus administration of intravenous
contrast. Negative oral contrast VoLumen was given.
CONTRAST:  12mL MULTIHANCE GADOBENATE DIMEGLUMINE 529 MG/ML IV SOLN

[Series 4: T2 · coronal · 5.0mm · 1.41mm/px · 1 of 33 slices shown (1 of 2)]
[im 1/33]
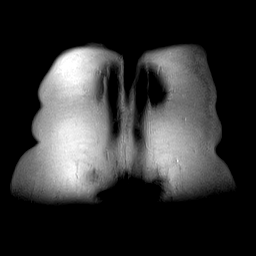

[Series 5: T2 · axial · 6.0mm · 0.78mm/px · z∈[-164,+171]mm · 2 of 44 slices shown (2 of 2)]
[im 1/44]
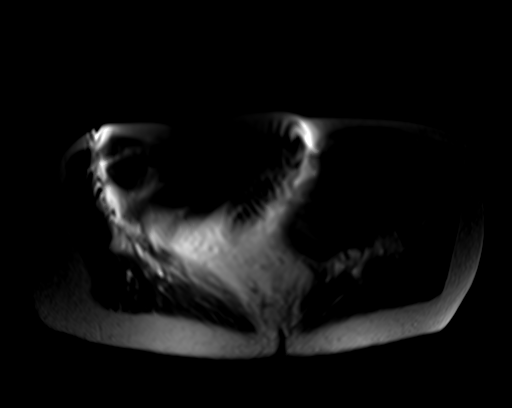
[im 44/44]
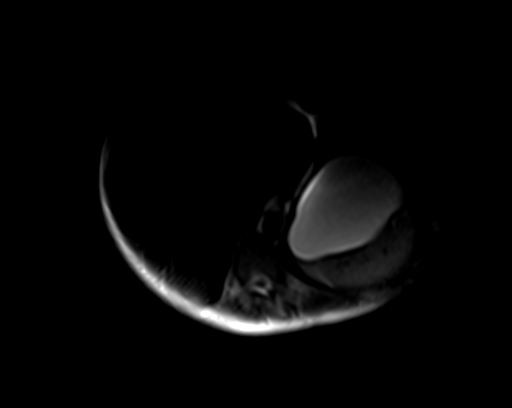

[Series 6: [id number]_400_800_p2 · axial · 6.3mm · 2.08mm/px · z∈[-180,+148]mm · 4 of 123 slices shown]
[im 1/123]
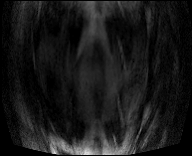
[im 41/123]
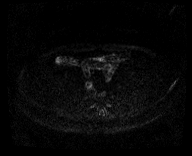
[im 82/123]
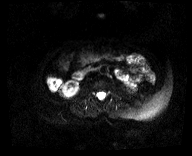
[im 123/123]
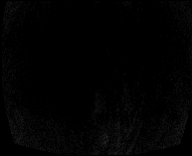

[Series 7: [id number]_adc · axial · 6.3mm · 2.08mm/px · 1 of 41 slices shown]
[im 1/41]
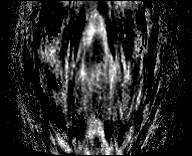

[Series 8: cor tru fisp · coronal · 5.0mm · 0.74mm/px · 1 of 39 slices shown]
[im 1/39]
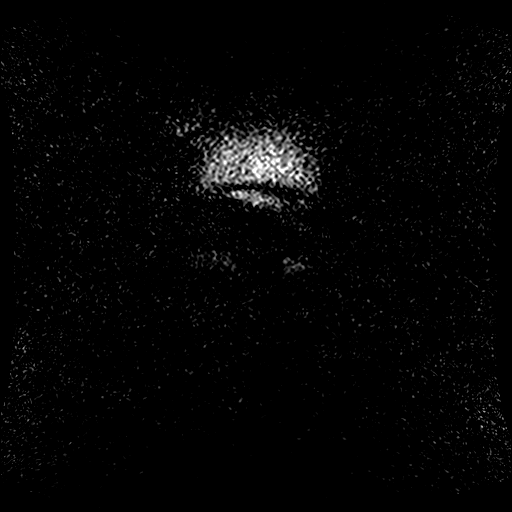

[Series 9: T1 dynamic · axial · non-contrast · 3.0mm · 0.70mm/px · z∈[-158,+175]mm · 4 of 112 slices shown]
[im 1/112]
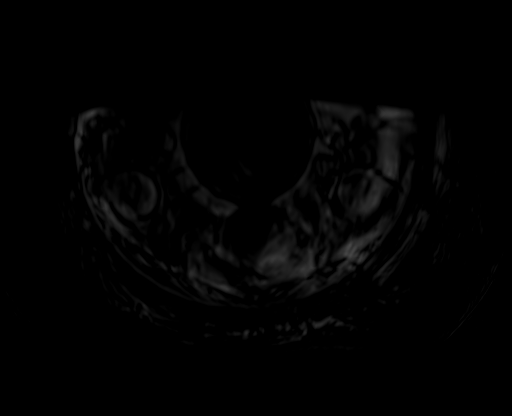
[im 38/112]
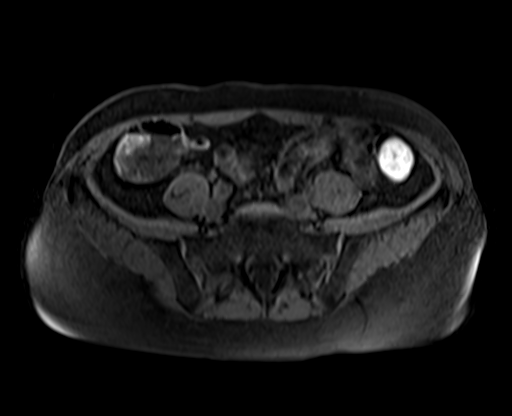
[im 75/112]
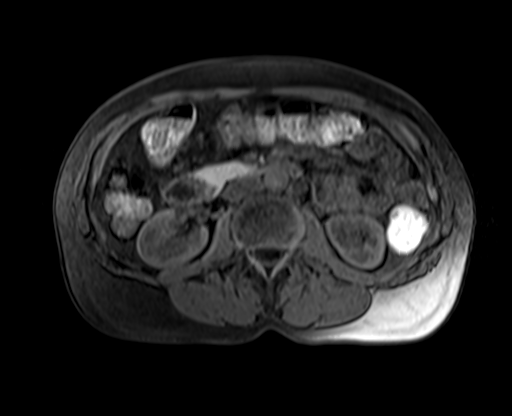
[im 112/112]
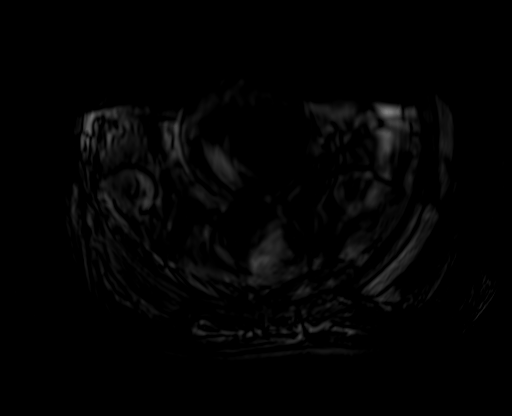

[Series 10: post 25 · axial · 3.0mm · 0.70mm/px · z∈[-158,+175]mm · 4 of 112 slices shown]
[im 1/112]
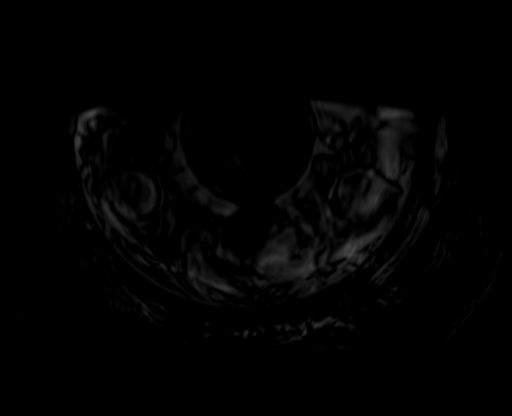
[im 38/112]
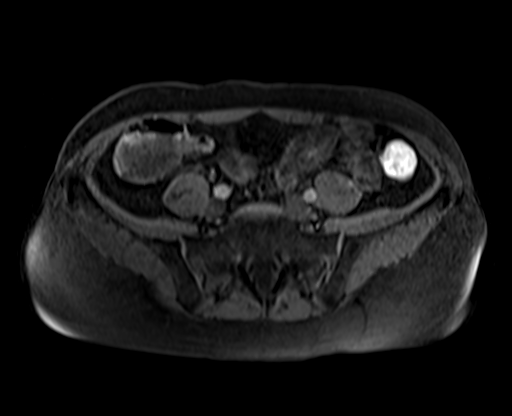
[im 75/112]
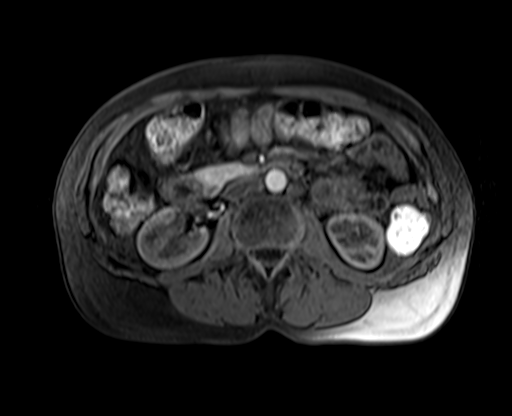
[im 112/112]
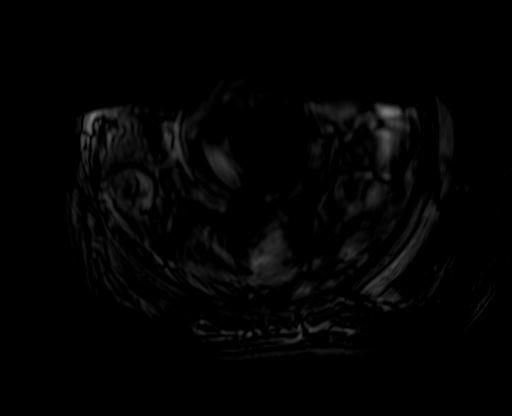

[Series 11: post 25_sub · axial · 3.0mm · 0.70mm/px · z∈[-158,+175]mm · 4 of 112 slices shown]
[im 1/112]
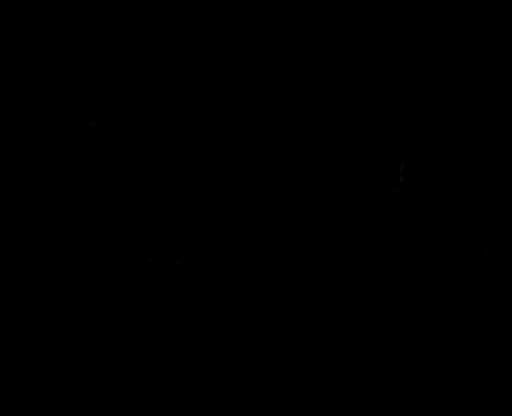
[im 38/112]
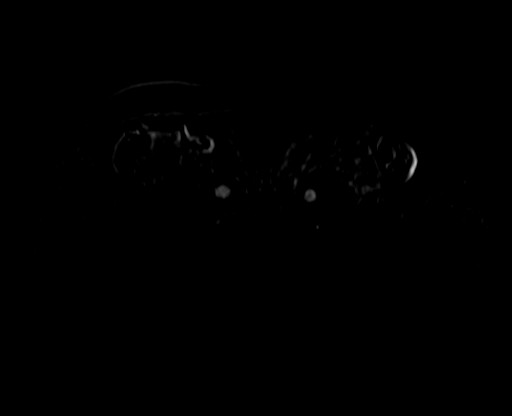
[im 75/112]
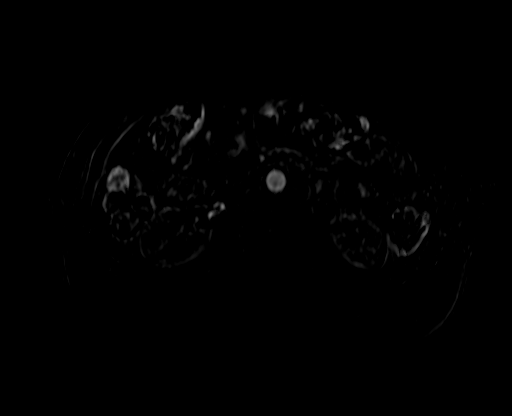
[im 112/112]
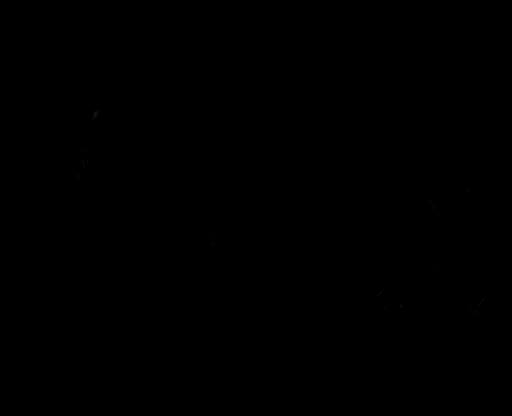

[Series 12: post 45 · axial · 3.0mm · 0.70mm/px · z∈[-158,+64]mm · 3 of 112 slices shown]
[im 1/112]
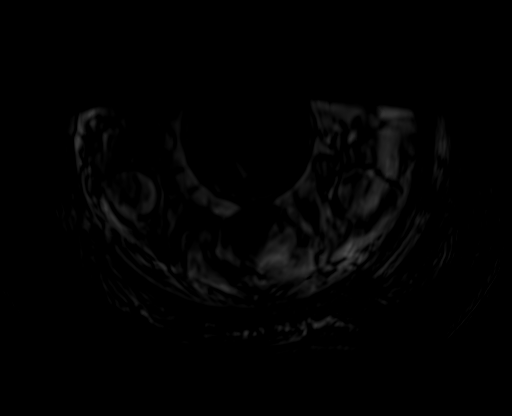
[im 38/112]
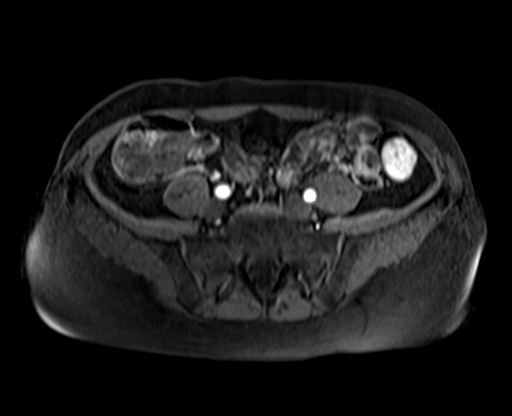
[im 75/112]
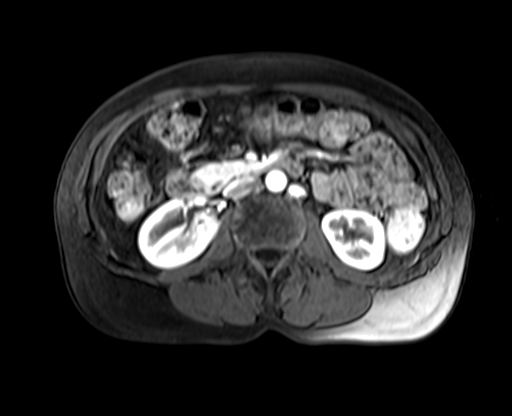

[24 of 48 positions shown; findings below may reference images not displayed]

FINDINGS: COMBINED FINDINGS FOR BOTH MR ABDOMEN AND PELVIS

Lower chest: Incidental imaging of the lung bases is normal.

Hepatobiliary: Limited assessment of the liver due to field of view
constraints. Liver and biliary tree grossly normal.

Pancreas: Normal T1 signal in the pancreas without ductal dilation
or sign of inflammation.

Spleen:  Normal in size and contour.

Adrenals/Urinary Tract:  Normal adrenal glands.  No hydronephrosis.

Stomach/Bowel: No sign of bowel obstruction or acute bowel process.
No mesenteric abnormalities. No stranding or wall thickening. Bowel
is evaluated through the mid to lower rectum also related to field
of view constraints. Imaging extends to the level of the acetabulum
and just below the acetabulum on some sequences.

Vascular/Lymphatic: Vascular structures in the abdomen are patent.
No aneurysmal dilation of the abdominal aorta. There is no
gastrohepatic or hepatoduodenal ligament lymphadenopathy. No
retroperitoneal or mesenteric lymphadenopathy.

Smooth contour of the IVC with retroaortic LEFT renal vein.

Reproductive: Post hysterectomy. The pelvis and pelvic floor not
well assessed due to field of view constraints.

Other:  No ascites

Musculoskeletal: No suspicious bone lesions identified.
IMPRESSION: No evidence of acute bowel process from stomach through the mid
rectum with limited assessment of the pelvic floor and upper abdomen
as described.

Visualized psoas and iliacus musculature is unremarkable. Post
contrast images extend through the level of the acetabulum, limited
by field of view constraints.

Given limitations described above and patient history would suggest
recalling the patient for repeat imaging of only the pelvis to
ensure complete coverage. Patient does not need to repeat ingestion
of the oral contrast agent for these repeat images.

ADDENDUM:
Additional imaging to include the entire pelvis was performed.

Urinary Tract: Urinary bladder is normal. No distal ureteral
dilation.

Bowel: No acute bowel process. Specifically no signs of perianal
stranding or thickening.

Vascular/Lymphatic: Vascular structures in the pelvis are patent.
Abdominal imaging previously performed partial imaging of the pelvis
as reported on the prior study. No adenopathy in the pelvis.

Reproductive: Post hysterectomy.  No ascites.  No pelvic mass.

Other: No ascites.

Musculoskeletal: No musculoskeletal abnormality, see dedicated
evaluation of the hip and pelvis reported separately.

Additional Impression: full coverage of the pelvis without visible
acute process and changes of hysterectomy. Please see dedicated MRI
of the pelvis and hip for further detail.

*** End of Addendum ***
FINDINGS: COMBINED FINDINGS FOR BOTH MR ABDOMEN AND PELVIS

Lower chest: Incidental imaging of the lung bases is normal.

Hepatobiliary: Limited assessment of the liver due to field of view
constraints. Liver and biliary tree grossly normal.

Pancreas: Normal T1 signal in the pancreas without ductal dilation
or sign of inflammation.

Spleen:  Normal in size and contour.

Adrenals/Urinary Tract:  Normal adrenal glands.  No hydronephrosis.

Stomach/Bowel: No sign of bowel obstruction or acute bowel process.
No mesenteric abnormalities. No stranding or wall thickening. Bowel
is evaluated through the mid to lower rectum also related to field
of view constraints. Imaging extends to the level of the acetabulum
and just below the acetabulum on some sequences.

Vascular/Lymphatic: Vascular structures in the abdomen are patent.
No aneurysmal dilation of the abdominal aorta. There is no
gastrohepatic or hepatoduodenal ligament lymphadenopathy. No
retroperitoneal or mesenteric lymphadenopathy.

Smooth contour of the IVC with retroaortic LEFT renal vein.

Reproductive: Post hysterectomy. The pelvis and pelvic floor not
well assessed due to field of view constraints.

Other:  No ascites

Musculoskeletal: No suspicious bone lesions identified.
IMPRESSION: No evidence of acute bowel process from stomach through the mid
rectum with limited assessment of the pelvic floor and upper abdomen
as described.

Visualized psoas and iliacus musculature is unremarkable. Post
contrast images extend through the level of the acetabulum, limited
by field of view constraints.

Given limitations described above and patient history would suggest
recalling the patient for repeat imaging of only the pelvis to
ensure complete coverage. Patient does not need to repeat ingestion
of the oral contrast agent for these repeat images.

## 2020-06-12 IMAGING — MR MR LUMBAR SPINE W/O CM
4 of 5 series · 26 of 48 positions shown · non-contrast
Comparison: None.

CLINICAL DATA: Lower back pain radiating to left groin

EXAM:
MRI LUMBAR SPINE WITHOUT CONTRAST
TECHNIQUE: Multiplanar, multisequence MR imaging of the lumbar spine was
performed. No intravenous contrast was administered.

[Series 3: T2 post-contrast · sagittal · 4.0mm · 0.53mm/px · 6 of 16 slices shown]
[im 1/16]
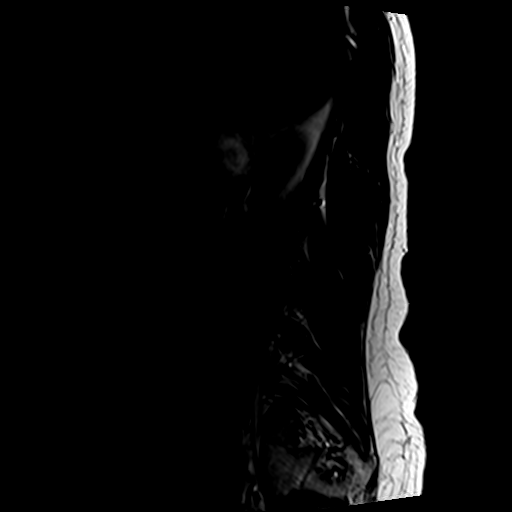
[im 4/16]
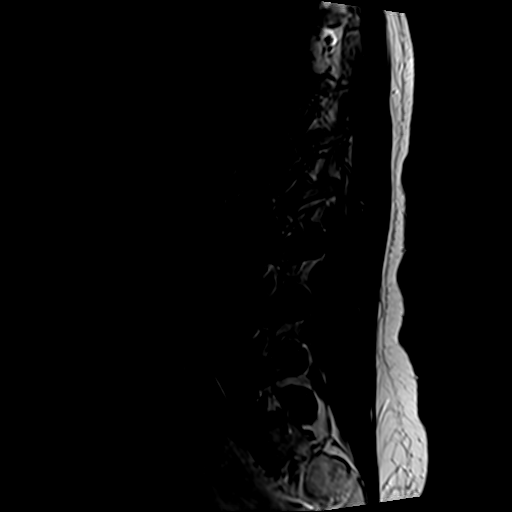
[im 7/16]
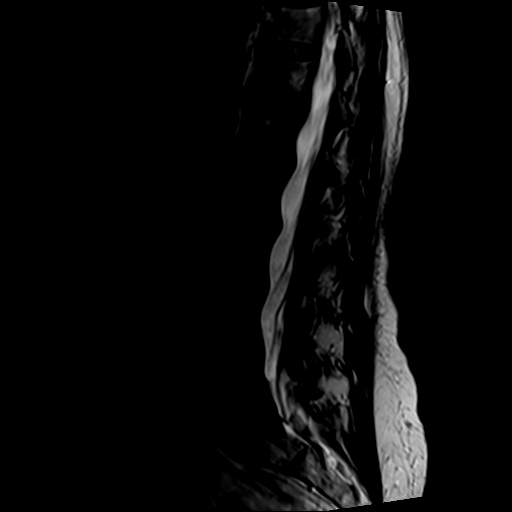
[im 10/16]
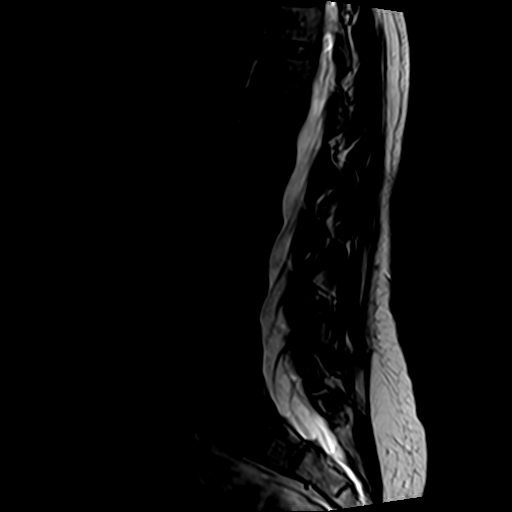
[im 13/16]
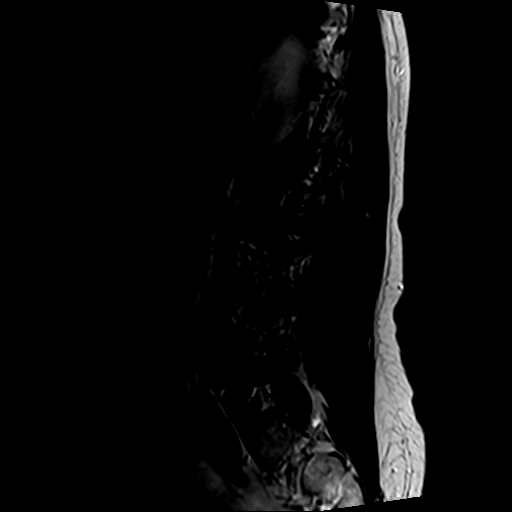
[im 16/16]
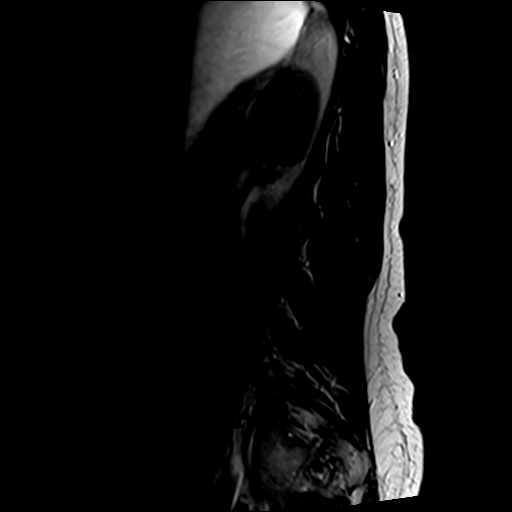

[Series 4: T1 · sagittal · 4.0mm · 0.53mm/px · 6 of 16 slices shown (1 of 2)]
[im 1/16]
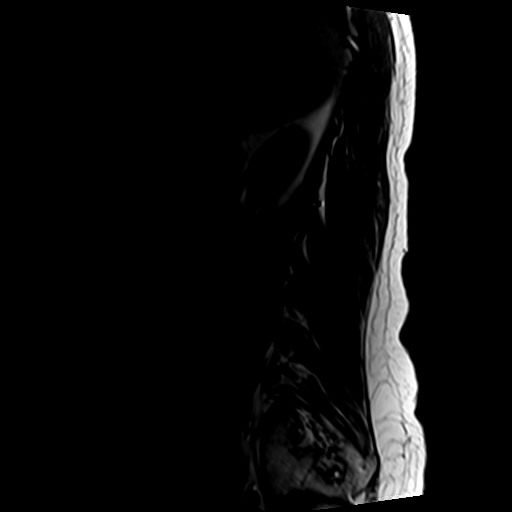
[im 4/16]
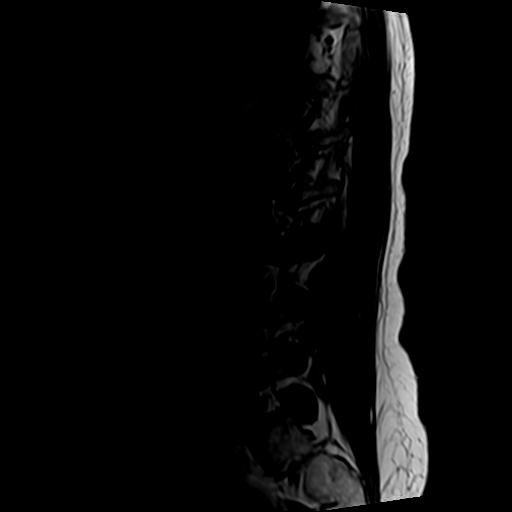
[im 7/16]
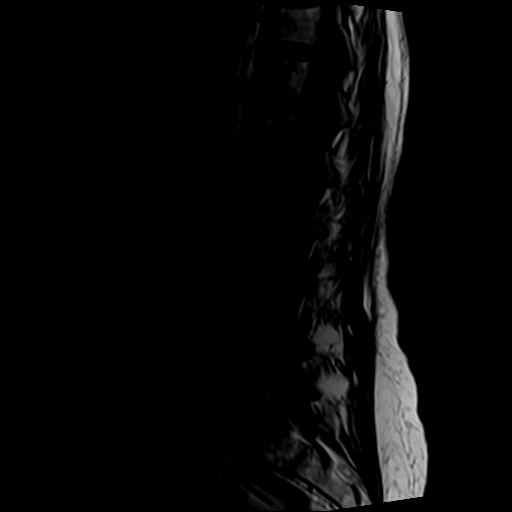
[im 10/16]
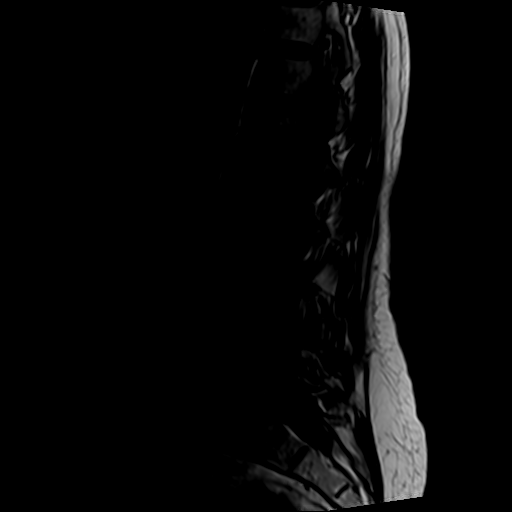
[im 13/16]
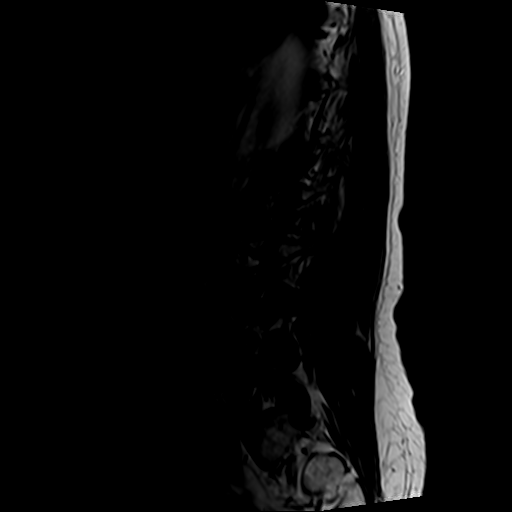
[im 16/16]
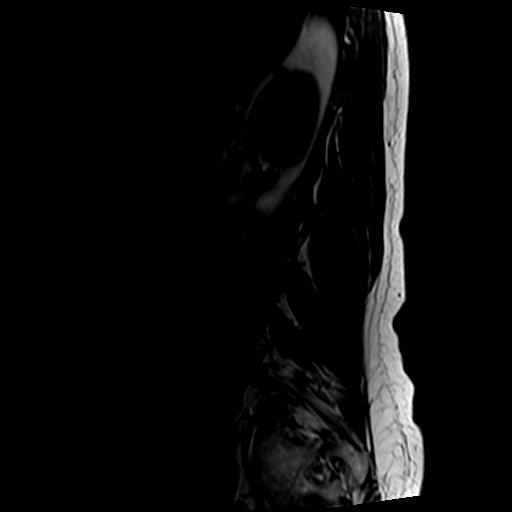

[Series 6: T2 · axial · 4.0mm · 0.70mm/px · z∈[-76,+145]mm · 9 of 38 slices shown]
[im 1/38]
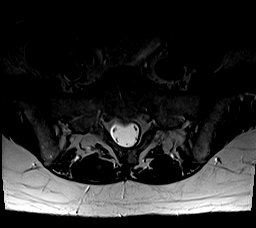
[im 6/38]
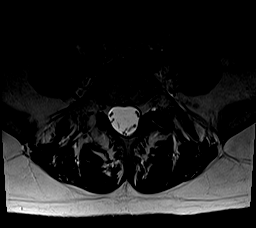
[im 11/38]
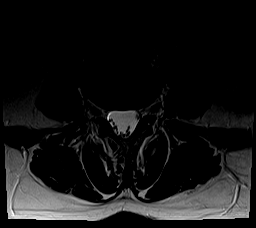
[im 16/38]
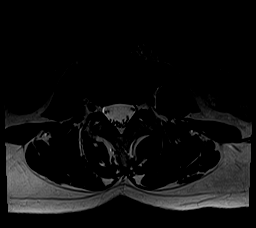
[im 19/38]
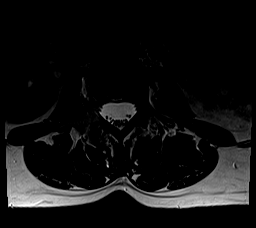
[im 22/38]
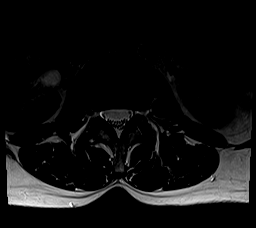
[im 27/38]
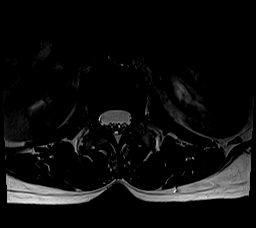
[im 32/38]
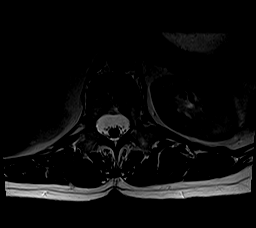
[im 38/38]
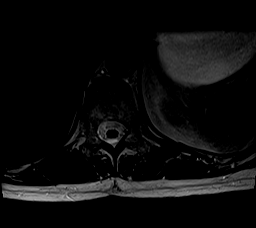

[Series 7: T1 · axial · 4.0mm · 0.35mm/px · z∈[-76,+114]mm · 5 of 38 slices shown (2 of 2)]
[im 1/38]
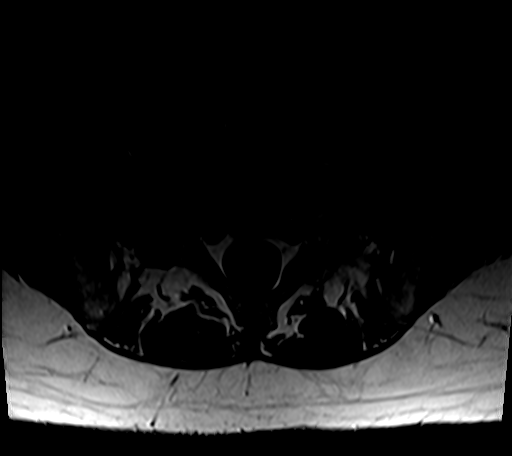
[im 6/38]
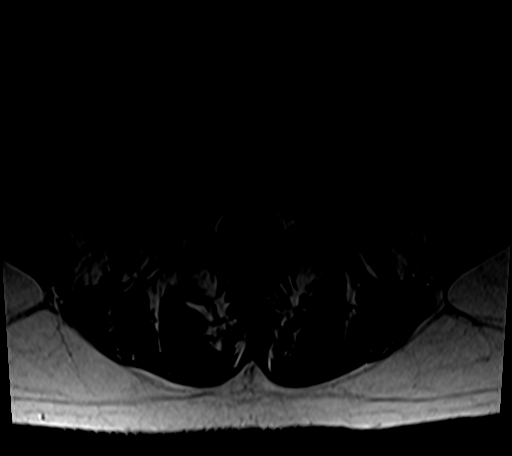
[im 11/38]
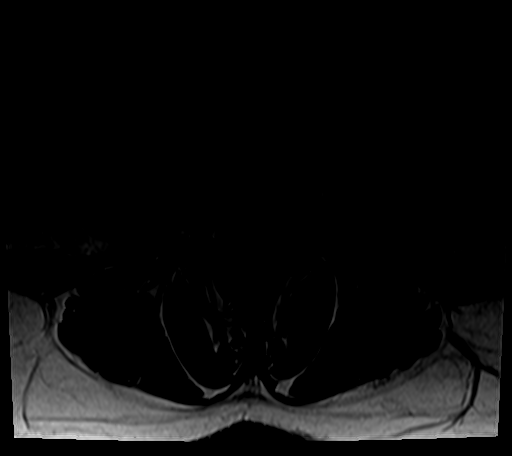
[im 19/38]
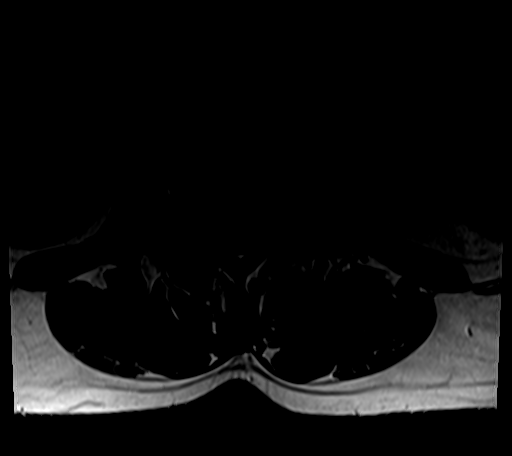
[im 32/38]
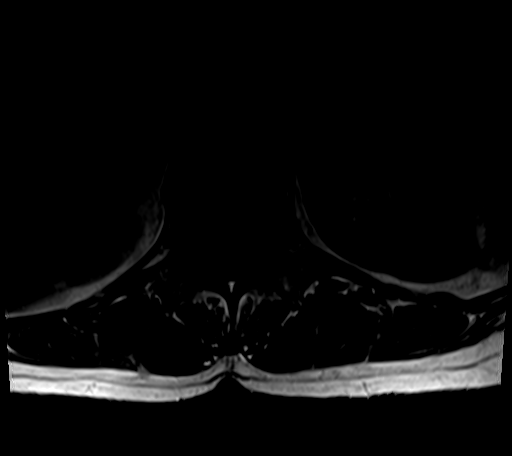

[26 of 48 positions shown; findings below may reference images not displayed]

FINDINGS: Segmentation: There are 5 non-rib bearing lumbar type vertebral
bodies with the last intervertebral disc space labeled as L5-S1.

Alignment:  Normal

Vertebrae: The vertebral body heights are well maintained. No
fracture, marrow edema,or pathologic marrow infiltration.

Conus medullaris and cauda equina: Conus extends to the L1 level.
Conus and cauda equina appear normal.

Paraspinal and other soft tissues: The paraspinal soft tissues and
visualized retroperitoneal structures are unremarkable. The
sacroiliac joints are intact.

Disc levels:

T12-L1:  No significant canal or neural foraminal narrowing.

L1-L2:   No significant canal or neural foraminal narrowing.

L2-L3: There is a broad-based disc bulge with a left foraminal disc
protrusion which contacts the exiting left L2 nerve root. There is
moderate left and mild right neural foraminal narrowing. Mild
central canal stenosis is seen.

L3-L4: There is a minimal broad-based disc bulge and ligamentum
flavum hypertrophy which causes mild bilateral neural foraminal
narrowing.

L4-L5: There is a broad-based disc bulge and ligamentum flavum
hypertrophy which causes mild bilateral neural foraminal narrowing.

L5-S1:   No significant canal or neural foraminal narrowing.
IMPRESSION: Lumbar spine spondylosis most notable at L2-L3 with a left foraminal
disc protrusion contacting the exiting left L2 nerve root. Moderate
left and mild right neural foraminal narrowing is seen.

## 2020-06-12 MED ORDER — GLUCAGON HCL RDNA (DIAGNOSTIC) 1 MG IJ SOLR
1.0000 mg | Freq: Once | INTRAMUSCULAR | Status: AC
  Administered 2020-06-12: 1 mg via INTRAMUSCULAR

## 2020-06-12 MED ORDER — GADOBENATE DIMEGLUMINE 529 MG/ML IV SOLN
12.0000 mL | Freq: Once | INTRAVENOUS | Status: AC | PRN
  Administered 2020-06-12: 12 mL via INTRAVENOUS

## 2020-06-14 ENCOUNTER — Telehealth: Payer: Self-pay

## 2020-06-14 ENCOUNTER — Other Ambulatory Visit: Payer: Self-pay

## 2020-06-14 DIAGNOSIS — M25552 Pain in left hip: Secondary | ICD-10-CM

## 2020-06-14 NOTE — Telephone Encounter (Signed)
Spoke with patient who is fine with plan moving forward to get 2 more MRI images.

## 2020-06-14 NOTE — Telephone Encounter (Signed)
Attempted to call patient to discuss images that Dr. Tamala Julian wants her to get: MRI pelvis wo and left hip wo. Unable to leave voicemail as mailbox was full.

## 2020-06-15 ENCOUNTER — Telehealth: Payer: Self-pay

## 2020-06-15 NOTE — Telephone Encounter (Signed)
Told Ms Perrow that there were no enlarged lymph nodes seen on scans. Dr. Denman George recommends that she continue work up from orthopedic standpoint. Pt verbalized understanding.

## 2020-06-17 ENCOUNTER — Other Ambulatory Visit: Payer: BC Managed Care – PPO

## 2020-06-18 ENCOUNTER — Encounter: Payer: Self-pay | Admitting: Family Medicine

## 2020-06-18 ENCOUNTER — Ambulatory Visit
Admission: RE | Admit: 2020-06-18 | Discharge: 2020-06-18 | Disposition: A | Discharge status: Home / Self Care | Payer: BC Managed Care – PPO | Source: Ambulatory Visit | Attending: Family Medicine | Admitting: Family Medicine

## 2020-06-18 ENCOUNTER — Other Ambulatory Visit: Payer: BC Managed Care – PPO

## 2020-06-18 ENCOUNTER — Other Ambulatory Visit: Payer: Self-pay

## 2020-06-18 ENCOUNTER — Ambulatory Visit
Admission: RE | Admit: 2020-06-18 | Discharge: 2020-06-18 | Disposition: A | Discharge status: Home / Self Care | Payer: Self-pay | Source: Ambulatory Visit | Attending: Family Medicine | Admitting: Family Medicine

## 2020-06-18 DIAGNOSIS — M25552 Pain in left hip: Secondary | ICD-10-CM

## 2020-06-18 DIAGNOSIS — S76312A Strain of muscle, fascia and tendon of the posterior muscle group at thigh level, left thigh, initial encounter: Secondary | ICD-10-CM | POA: Diagnosis not present

## 2020-06-18 IMAGING — MR MR HIP*L* W/O CM
5 series · 31 of 40 positions shown · non-contrast
Comparison: X-ray [DATE]

CLINICAL DATA: Left hip and groin pain

EXAM:
MR OF THE LEFT HIP WITHOUT CONTRAST
MR OF THE PELVIS WITHOUT CONTRAST
TECHNIQUE: Multiplanar, multisequence MR imaging was performed. No intravenous
contrast was administered.

[Series 2: T1 · coronal · 3.0mm · 0.78mm/px · 2 of 42 slices shown]
[im 1/42]
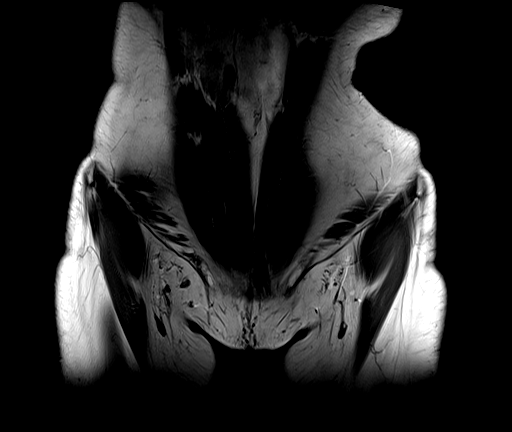
[im 6/42]
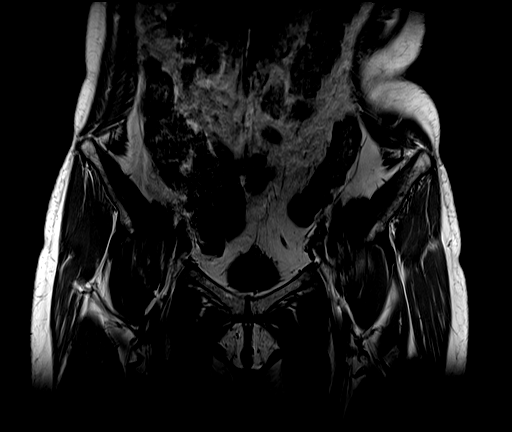

[Series 3: T2 fat-sat · axial · 5.0mm · 0.78mm/px · z∈[-139,+64]mm · 7 of 30 slices shown (1 of 2)]
[im 1/30]
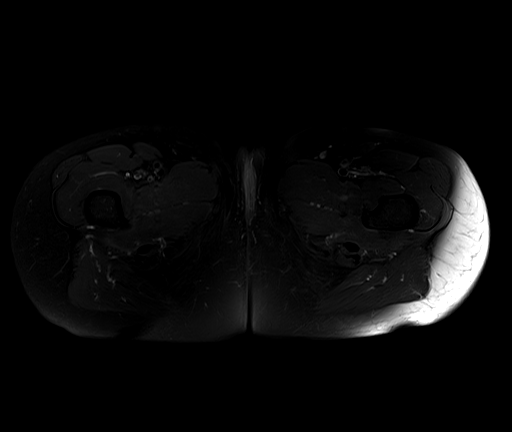
[im 5/30]
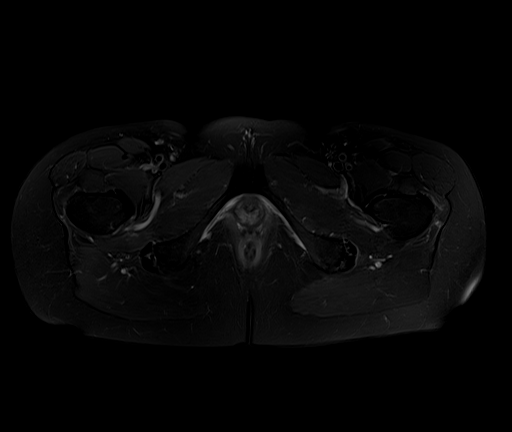
[im 10/30]
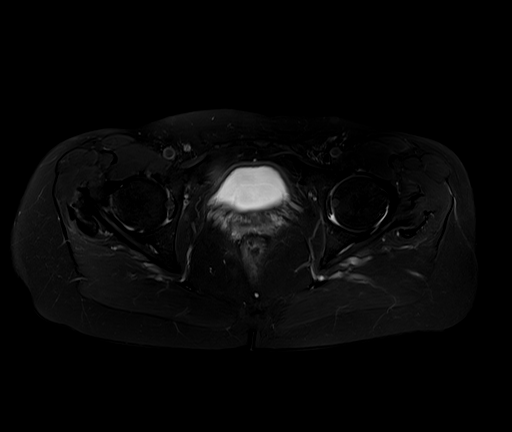
[im 15/30]
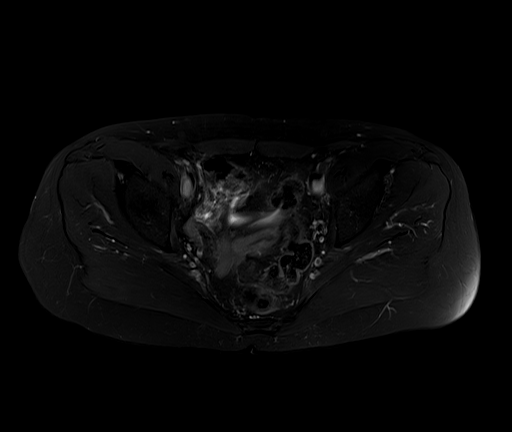
[im 20/30]
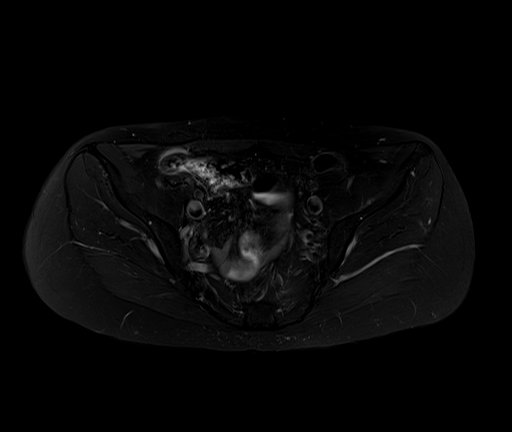
[im 25/30]
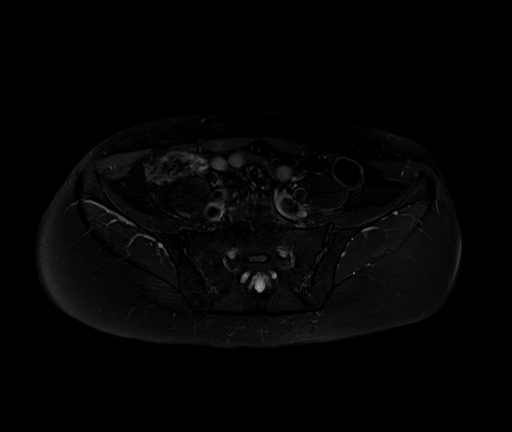
[im 30/30]
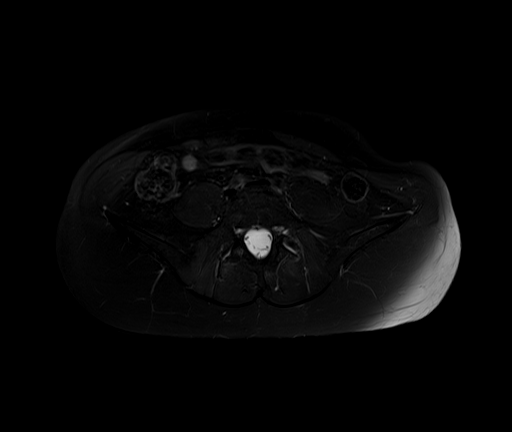

[Series 4: T2 fat-sat · coronal · 3.0mm · 1.04mm/px · 8 of 42 slices shown (2 of 2)]
[im 1/42]
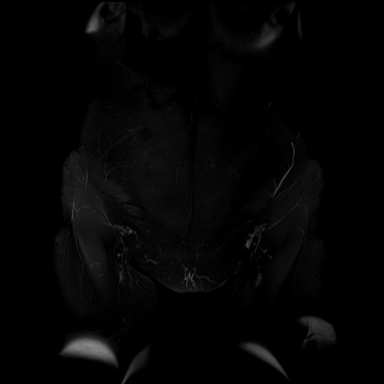
[im 5/42]
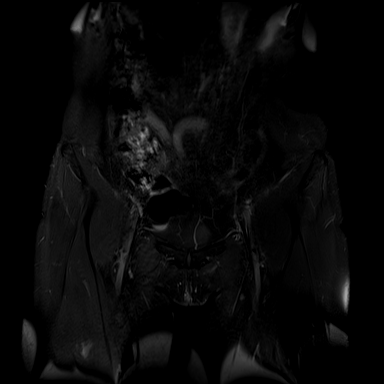
[im 14/42]
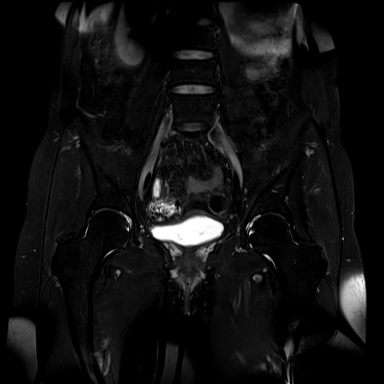
[im 19/42]
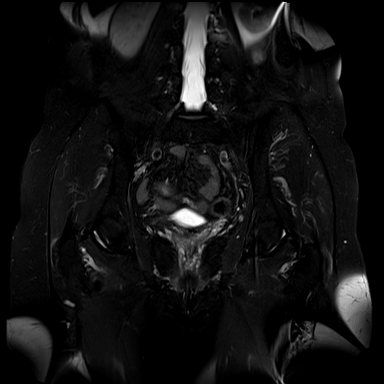
[im 23/42]
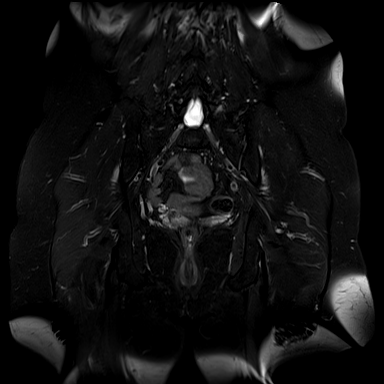
[im 28/42]
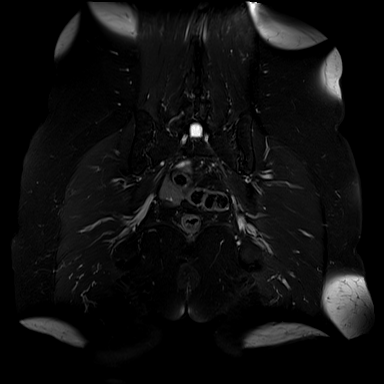
[im 37/42]
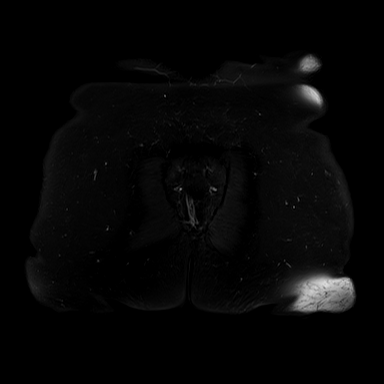
[im 42/42]
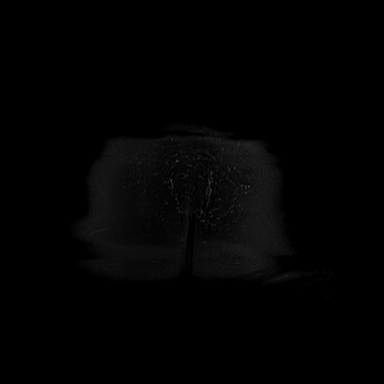

[Series 5: PD fat-sat · coronal · 3.0mm · 0.56mm/px · 7 of 30 slices shown (1 of 2)]
[im 1/30]
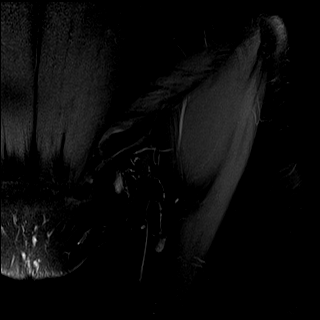
[im 5/30]
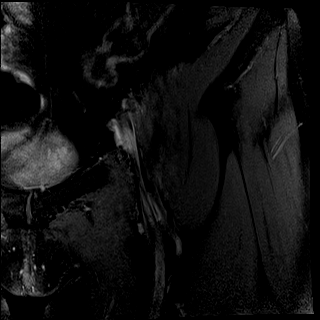
[im 10/30]
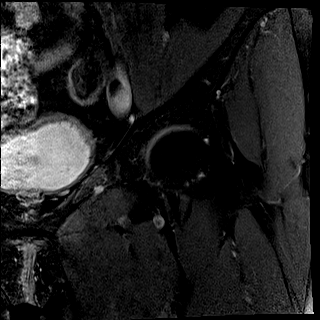
[im 15/30]
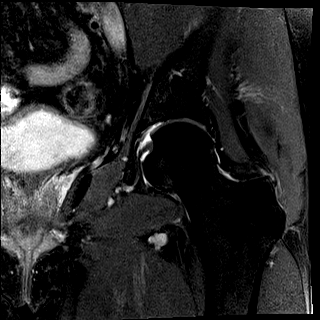
[im 20/30]
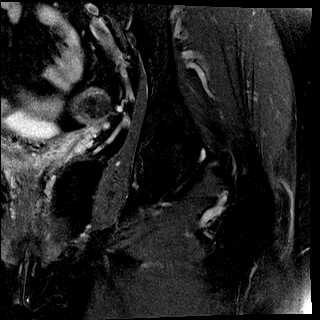
[im 25/30]
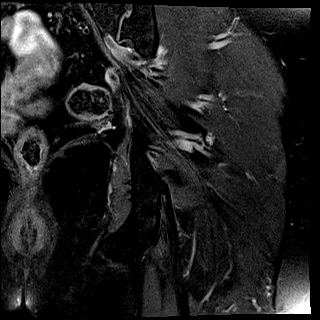
[im 30/30]
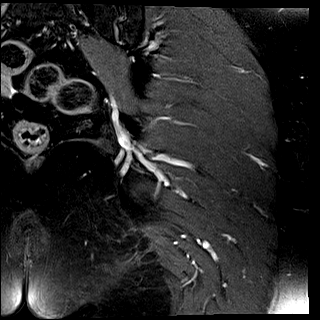

[Series 6: PD fat-sat · sagittal · 3.0mm · 0.56mm/px · 7 of 31 slices shown (2 of 2)]
[im 1/31]
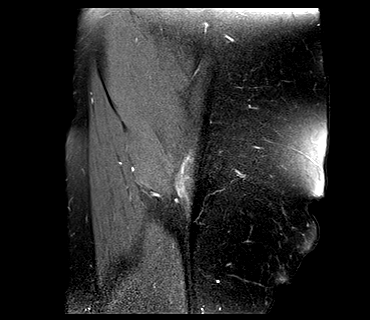
[im 6/31]
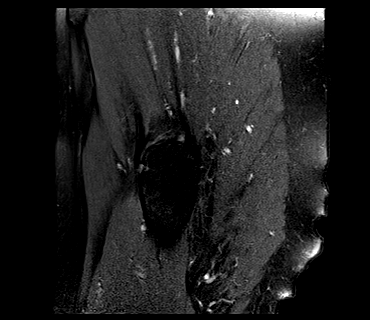
[im 11/31]
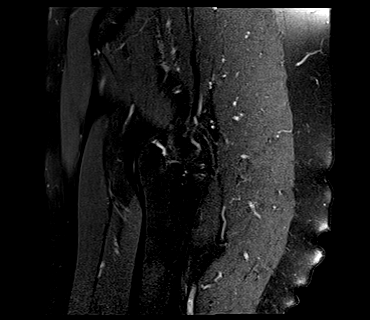
[im 16/31]
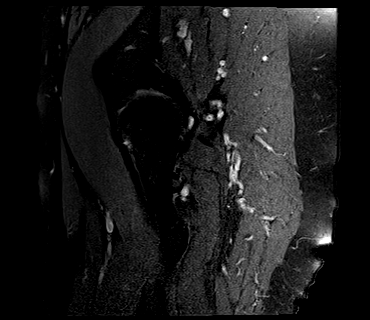
[im 21/31]
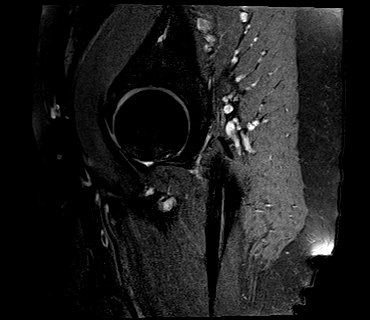
[im 26/31]
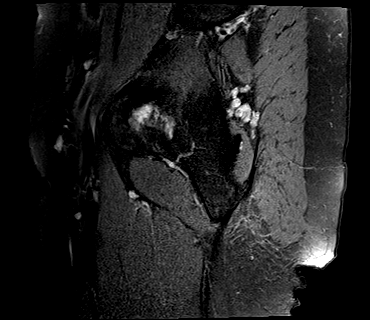
[im 31/31]
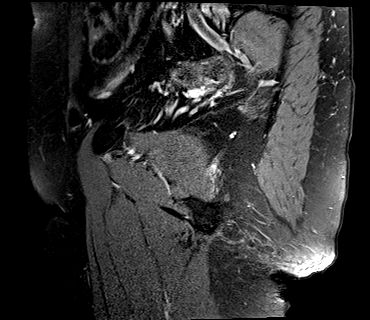

[31 of 40 positions shown; findings below may reference images not displayed]

FINDINGS: Bones: No acute fracture. No dislocation. No femoral head avascular
necrosis. Bony pelvis is intact. No significant arthropathy of the
sacroiliac joints or pubic symphysis. No bone marrow edema. No
marrow replacing lesion. Visualized lower lumbar spine demonstrates
preservation of the disc heights without significant degenerative
changes.

Articular cartilage and labrum

Articular cartilage: No cartilage defect or subchondral marrow
signal changes.

Labrum: Intrasubstance high T2 signal within the left acetabular
labrum suggesting degeneration without a well-defined tear. No
paralabral cyst. Full field imaging of the pelvis demonstrates
degeneration and probable tearing of the superior right acetabular
labrum (series 4, image 15).

Joint or bursal effusion

Joint effusion:  No hip joint effusion.

Bursae: No abnormal bursal fluid collection.

Muscles and tendons

Muscles and tendons: Low-grade partial thickness tears of the left
hamstring tendon origin (series 4, images 23 and 26). The left
gluteal, iliopsoas, rectus femoris, and adductor tendons appear
intact without tear or significant tendinosis. Normal muscle bulk
and signal intensity without edema, atrophy, or fatty infiltration.

Other findings

Miscellaneous: No soft tissue edema or fluid collection. No inguinal
lymphadenopathy. Please see dedicated MR enterography report for
intrapelvic findings.
IMPRESSION: 1. No acute osseous abnormality of the pelvis or left hip.
2. Low-grade partial thickness tears of the left hamstring tendon
origin.
3. Degeneration with probable tearing of the superior right
acetabular labrum.

## 2020-06-18 IMAGING — MR MR PELVIS W/O CM
4 of 5 series · 27 of 48 positions shown · non-contrast
Comparison: X-ray [DATE]

CLINICAL DATA: Left hip and groin pain

EXAM:
MR OF THE LEFT HIP WITHOUT CONTRAST
MR OF THE PELVIS WITHOUT CONTRAST
TECHNIQUE: Multiplanar, multisequence MR imaging was performed. No intravenous
contrast was administered.

[Series 3: STIR · coronal · 3.0mm · 1.25mm/px · 11 of 42 slices shown]
[im 1/42]
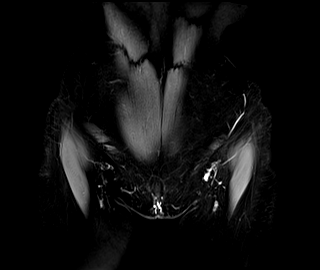
[im 5/42]
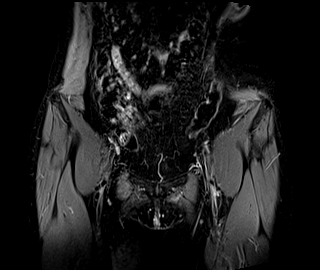
[im 9/42]
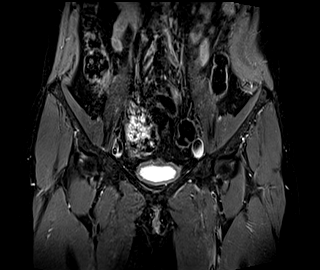
[im 13/42]
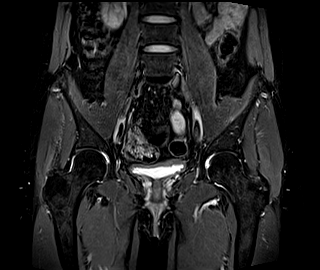
[im 17/42]
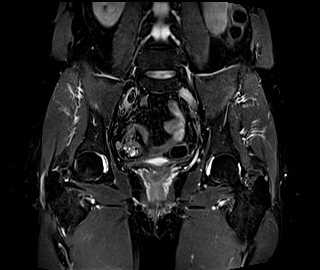
[im 21/42]
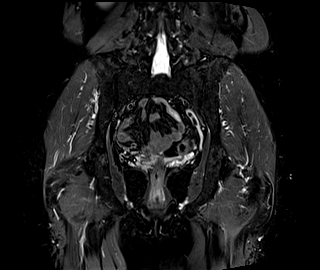
[im 25/42]
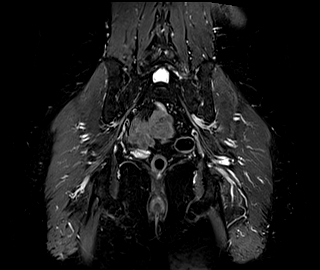
[im 29/42]
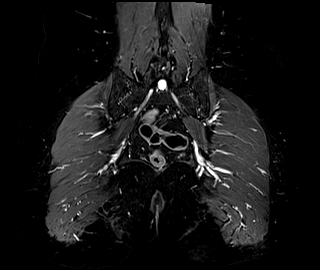
[im 33/42]
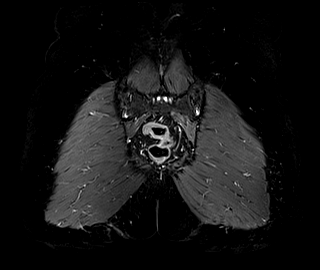
[im 37/42]
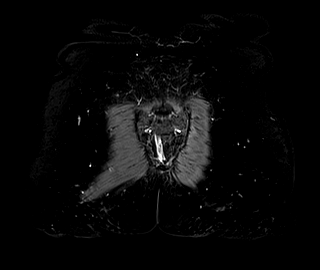
[im 42/42]
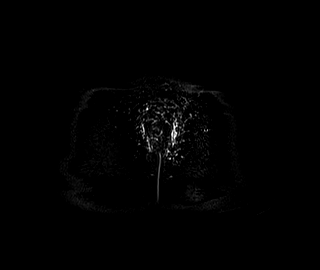

[Series 4: T1 · coronal · 3.0mm · 0.78mm/px · 8 of 42 slices shown (1 of 2)]
[im 1/42]
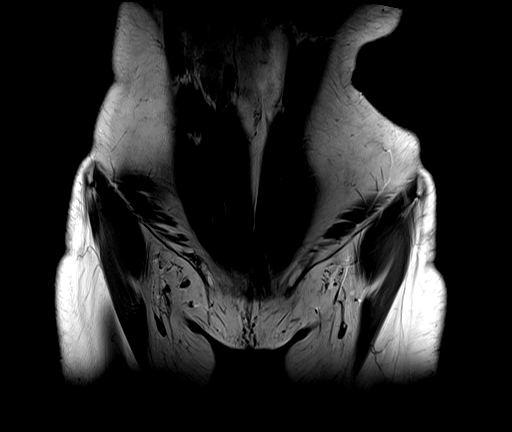
[im 5/42]
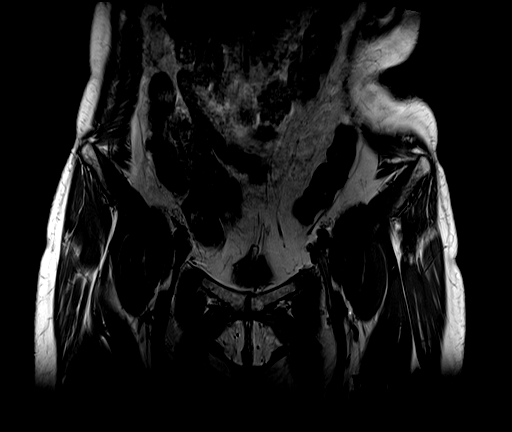
[im 14/42]
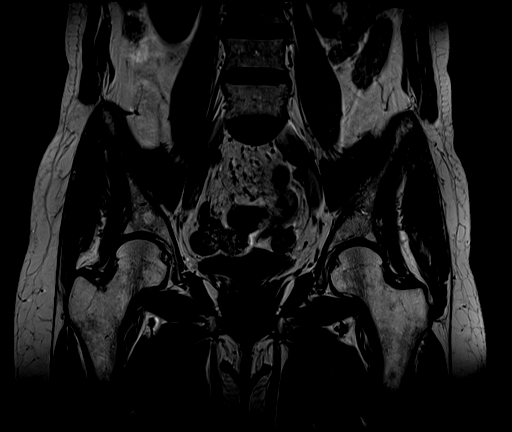
[im 19/42]
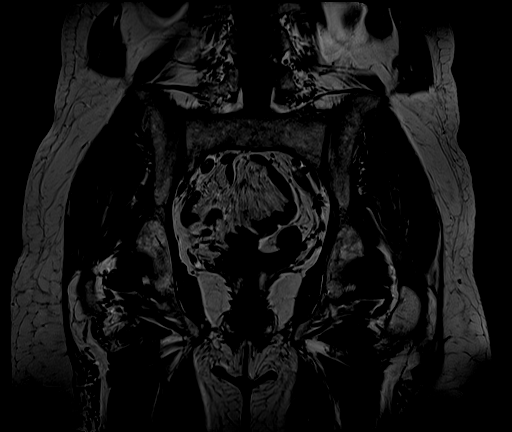
[im 23/42]
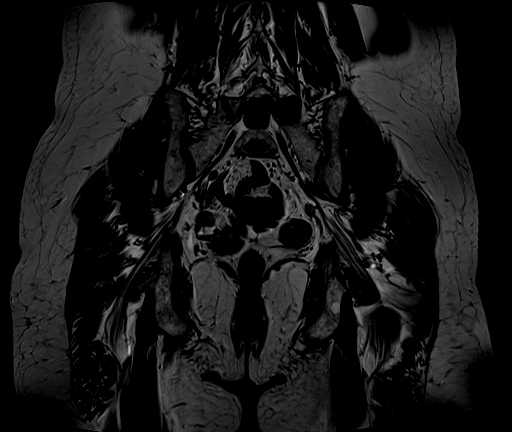
[im 28/42]
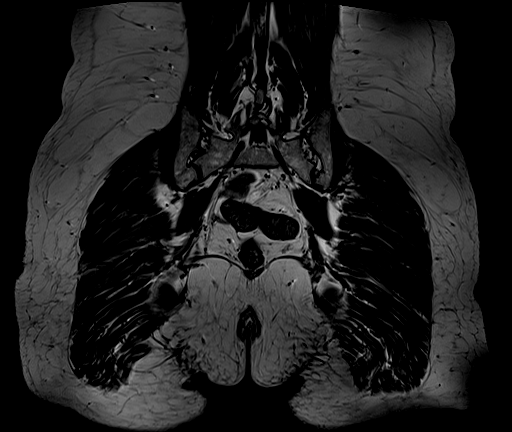
[im 37/42]
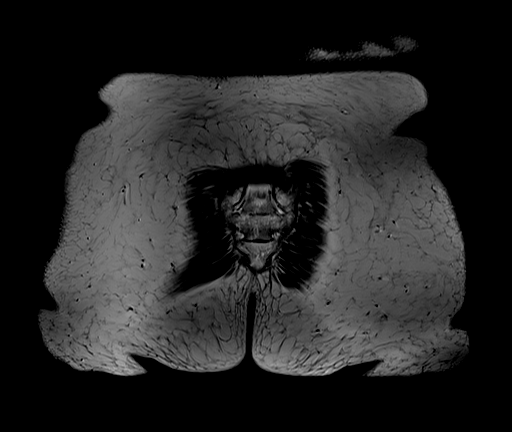
[im 42/42]
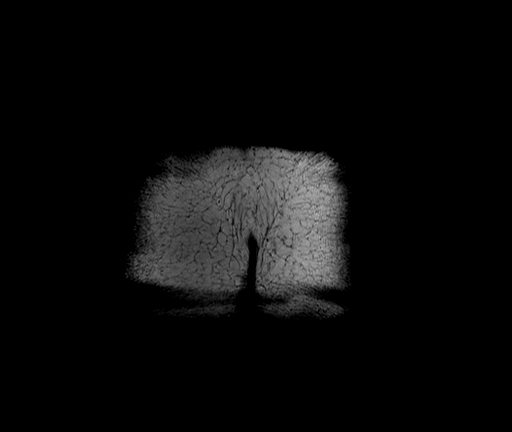

[Series 5: T1 · axial · 5.0mm · 0.78mm/px · z∈[-156,+47]mm · 5 of 35 slices shown (2 of 2)]
[im 1/35]
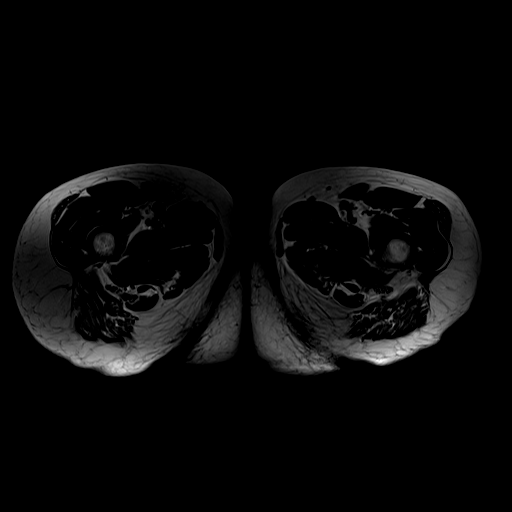
[im 5/35]
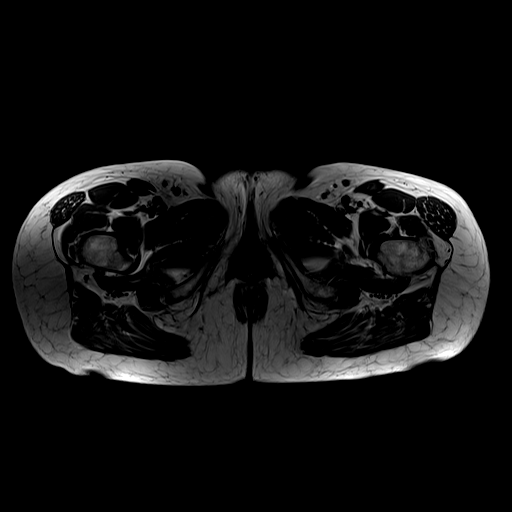
[im 9/35]
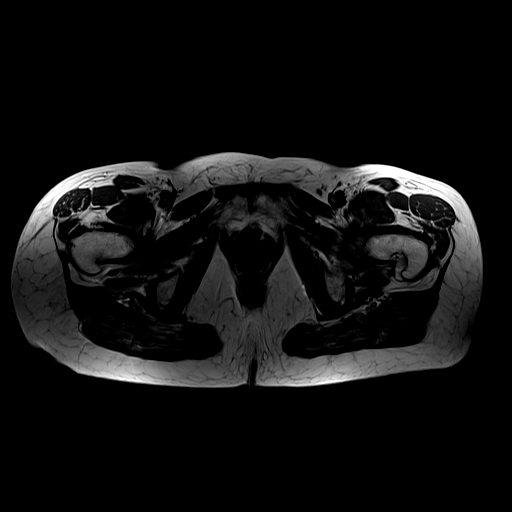
[im 18/35]
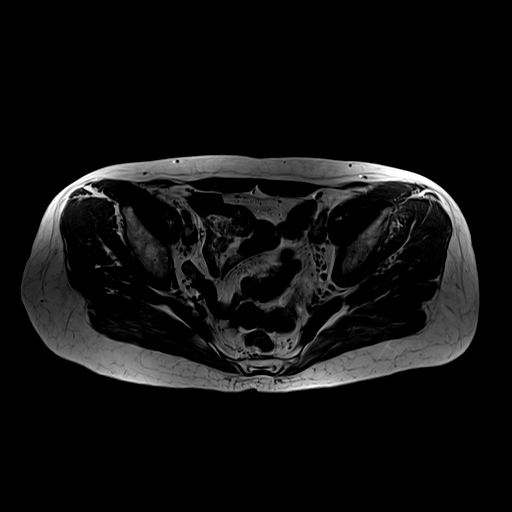
[im 30/35]
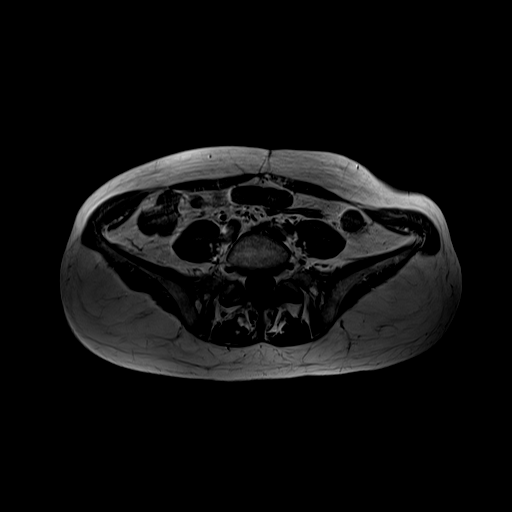

[Series 6: T2 fat-sat · axial · 5.0mm · 0.78mm/px · z∈[-111,+29]mm · 3 of 30 slices shown]
[im 5/30]
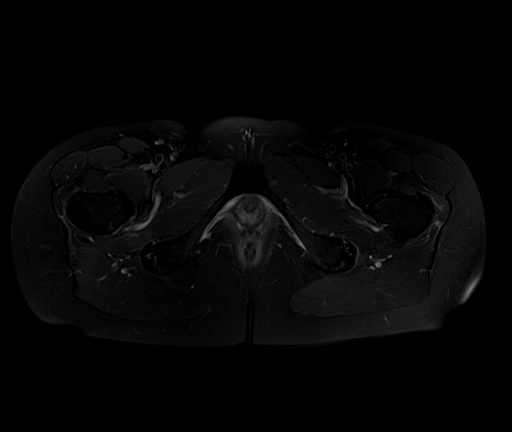
[im 15/30]
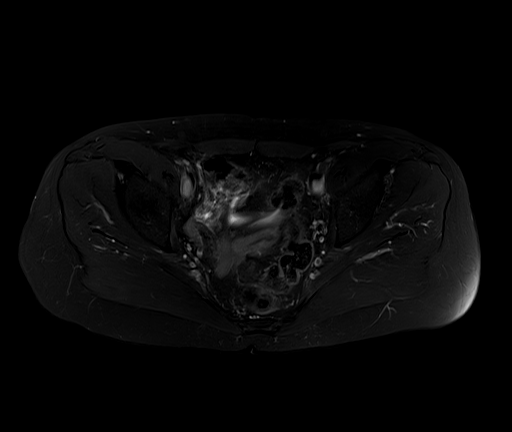
[im 25/30]
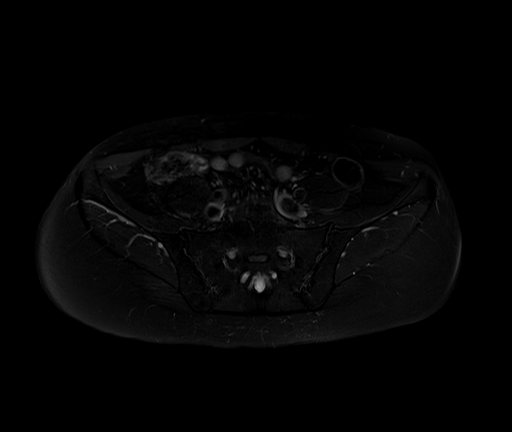

[27 of 48 positions shown; findings below may reference images not displayed]

FINDINGS: Bones: No acute fracture. No dislocation. No femoral head avascular
necrosis. Bony pelvis is intact. No significant arthropathy of the
sacroiliac joints or pubic symphysis. No bone marrow edema. No
marrow replacing lesion. Visualized lower lumbar spine demonstrates
preservation of the disc heights without significant degenerative
changes.

Articular cartilage and labrum

Articular cartilage: No cartilage defect or subchondral marrow
signal changes.

Labrum: Intrasubstance high T2 signal within the left acetabular
labrum suggesting degeneration without a well-defined tear. No
paralabral cyst. Full field imaging of the pelvis demonstrates
degeneration and probable tearing of the superior right acetabular
labrum (series 4, image 15).

Joint or bursal effusion

Joint effusion:  No hip joint effusion.

Bursae: No abnormal bursal fluid collection.

Muscles and tendons

Muscles and tendons: Low-grade partial thickness tears of the left
hamstring tendon origin (series 4, images 23 and 26). The left
gluteal, iliopsoas, rectus femoris, and adductor tendons appear
intact without tear or significant tendinosis. Normal muscle bulk
and signal intensity without edema, atrophy, or fatty infiltration.

Other findings

Miscellaneous: No soft tissue edema or fluid collection. No inguinal
lymphadenopathy. Please see dedicated MR enterography report for
intrapelvic findings.
IMPRESSION: 1. No acute osseous abnormality of the pelvis or left hip.
2. Low-grade partial thickness tears of the left hamstring tendon
origin.
3. Degeneration with probable tearing of the superior right
acetabular labrum.

## 2020-06-18 IMAGING — MR MR [PERSON_NAME] PELVIS W/CM
10 series · 44 of 48 positions shown · IV contrast (multihance)
Comparison: CT abdomen and pelvis dated [DATE]
COMPARISON: CT abdomen and pelvis dated [DATE]

Addendum:
CLINICAL DATA: History of endometrial cancer with pelvic pain on
the LEFT worse with with bowel movements that reported history of
constipation

EXAM:
MR ABDOMEN AND PELVIS WITHOUT AND WITH CONTRAST (MR ENTEROGRAPHY)
TECHNIQUE: Multiplanar, multisequence MRI of the abdomen and pelvis was
performed both before and during bolus administration of intravenous
contrast. Negative oral contrast VoLumen was given.
CONTRAST:  12mL MULTIHANCE GADOBENATE DIMEGLUMINE 529 MG/ML IV SOLN

[Series 2: T2 · axial · 6.0mm · 1.22mm/px · 1 of 42 slices shown (1 of 2)]
[im 1/42]
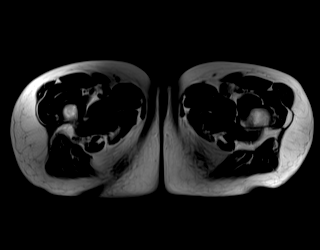

[Series 3: T2 · coronal · 6.0mm · 1.56mm/px · 2 of 36 slices shown (2 of 2)]
[im 1/36]
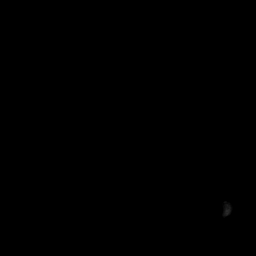
[im 36/36]
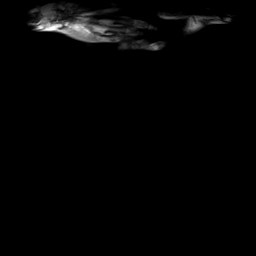

[Series 4: T1 · axial · 3.0mm · 1.19mm/px · z∈[-118,+119]mm · 8 of 160 slices shown]
[im 1/160]
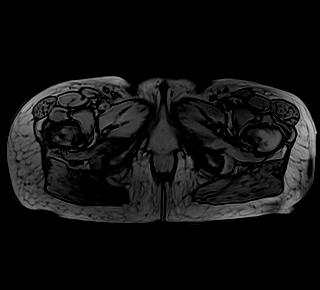
[im 23/160]
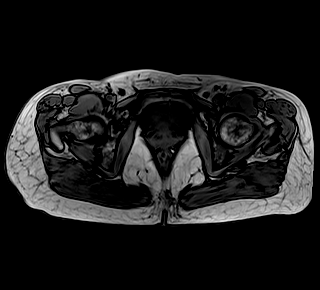
[im 46/160]
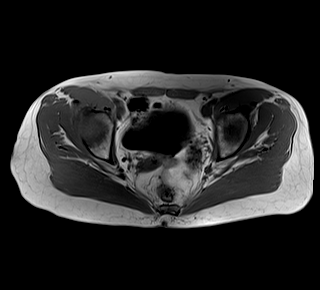
[im 69/160]
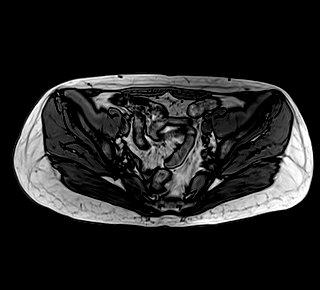
[im 91/160]
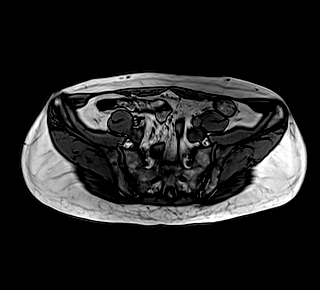
[im 114/160]
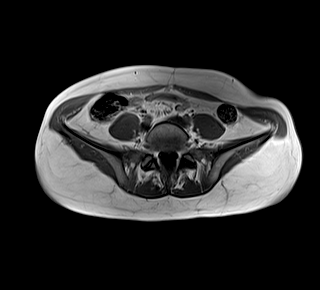
[im 137/160]
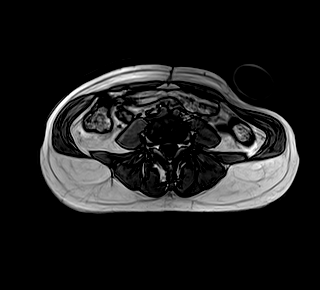
[im 160/160]
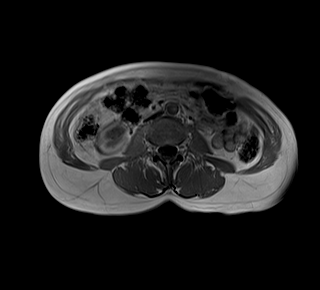

[Series 5: bSSFP · coronal · 5.0mm · 1.41mm/px · 1 of 27 slices shown]
[im 1/27]
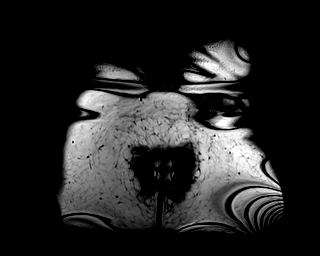

[Series 6: T1 dynamic · axial · non-contrast · 4.0mm · 1.25mm/px · z∈[-174,+174]mm · 5 of 88 slices shown (1 of 3)]
[im 1/88]
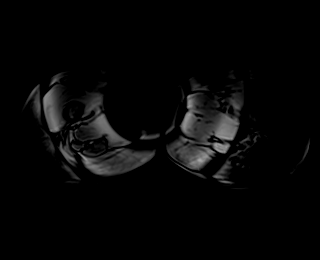
[im 22/88]
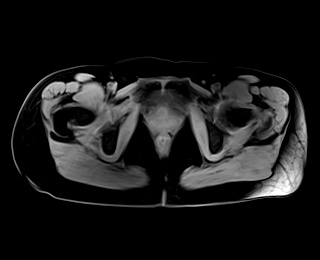
[im 44/88]
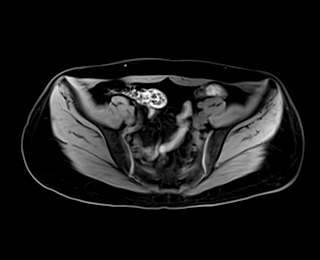
[im 66/88]
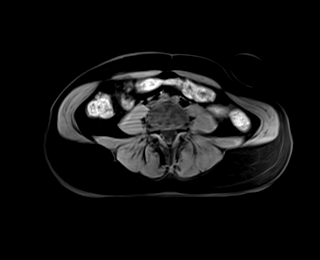
[im 88/88]
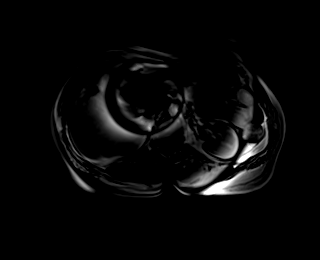

[Series 7: T1 dynamic post-contrast · axial · 4.0mm · 1.25mm/px · z∈[-174,+174]mm · 5 of 88 slices shown (1 of 3)]
[im 1/88]
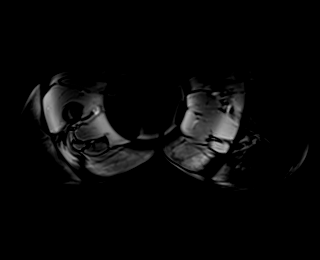
[im 22/88]
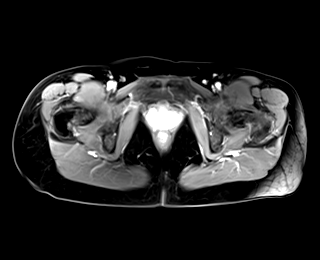
[im 44/88]
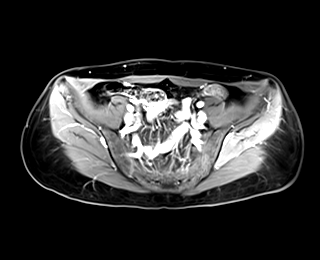
[im 66/88]
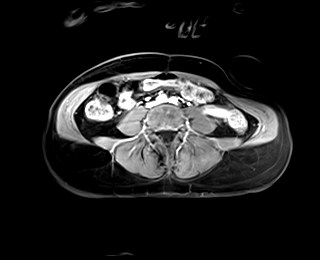
[im 88/88]
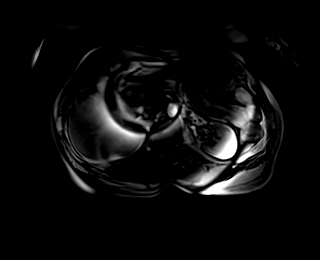

[Series 8: T1 dynamic · axial · 4.0mm · 1.25mm/px · z∈[-174,+174]mm · 4 of 87 slices shown (2 of 3)]
[im 1/87]
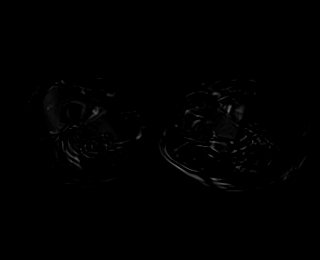
[im 29/87]
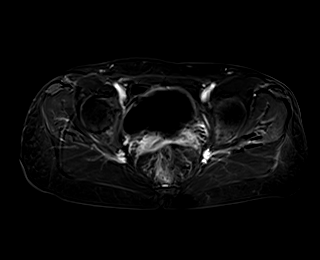
[im 58/87]
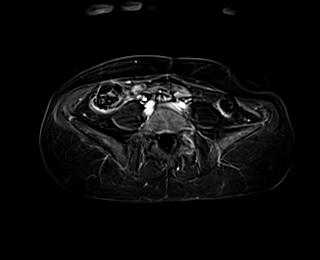
[im 87/87]
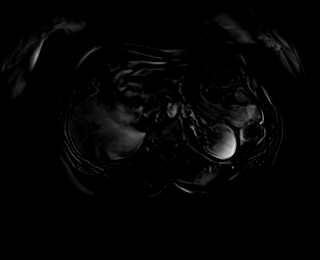

[Series 9: T1 dynamic post-contrast · coronal · 3.0mm · 1.38mm/px · 8 of 224 slices shown (2 of 3)]
[im 1/224]
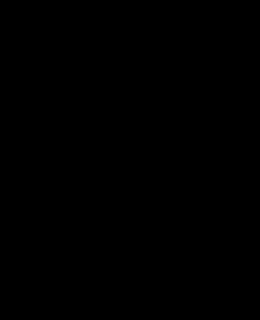
[im 41/224]
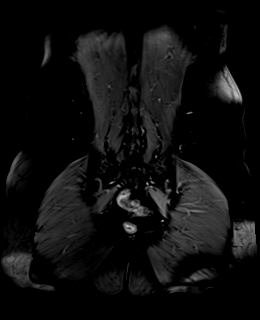
[im 61/224]
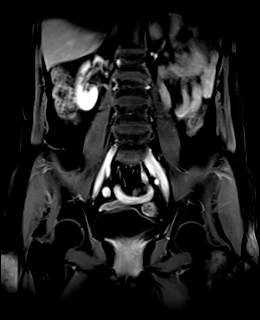
[im 102/224]
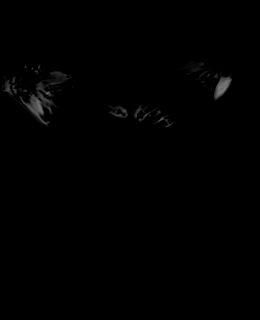
[im 122/224]
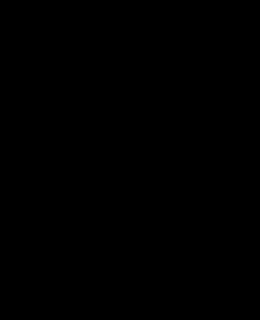
[im 163/224]
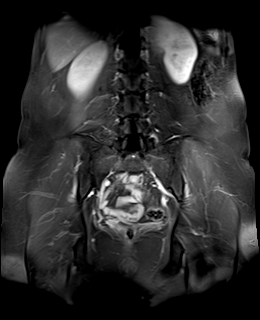
[im 183/224]
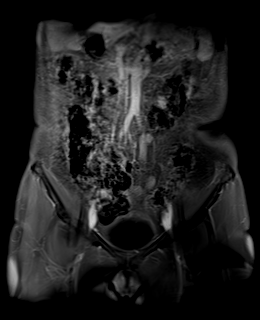
[im 224/224]
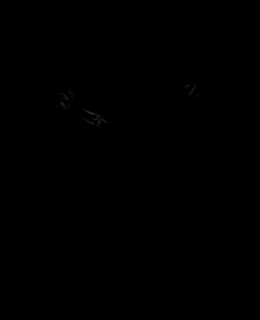

[Series 10: T1 dynamic post-contrast · axial · 4.0mm · 1.25mm/px · z∈[-174,+174]mm · 5 of 88 slices shown (3 of 3)]
[im 1/88]
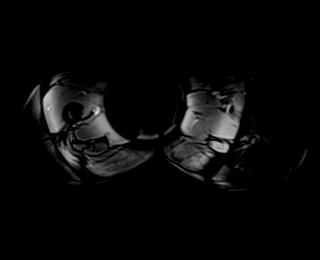
[im 22/88]
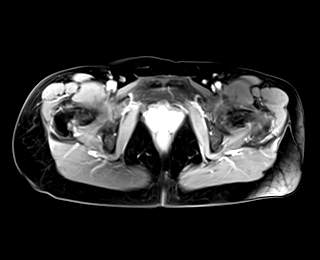
[im 44/88]
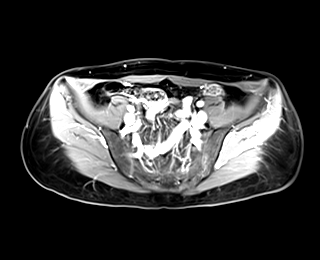
[im 66/88]
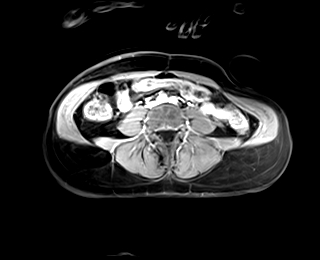
[im 88/88]
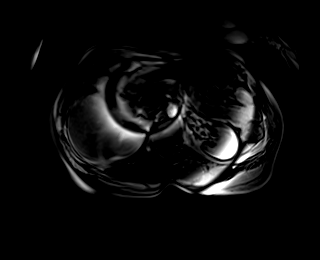

[Series 11: T1 dynamic · axial · 4.0mm · 1.25mm/px · z∈[-174,+174]mm · 5 of 88 slices shown (3 of 3)]
[im 1/88]
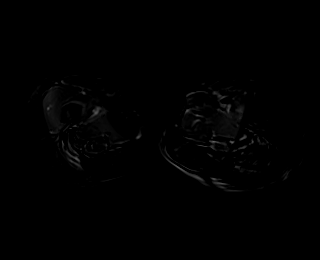
[im 22/88]
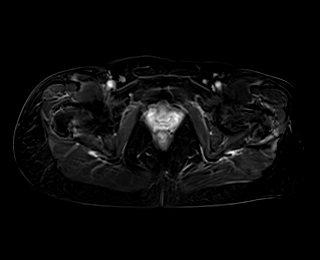
[im 44/88]
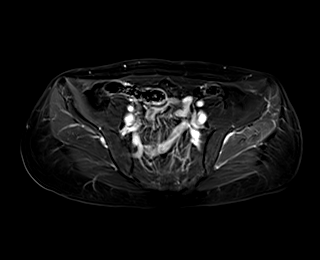
[im 66/88]
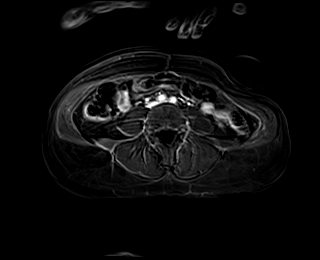
[im 88/88]
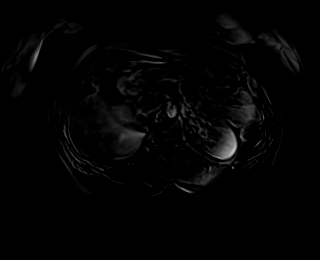

[44 of 48 positions shown; findings below may reference images not displayed]

FINDINGS: COMBINED FINDINGS FOR BOTH MR ABDOMEN AND PELVIS

Lower chest: Incidental imaging of the lung bases is normal.

Hepatobiliary: Limited assessment of the liver due to field of view
constraints. Liver and biliary tree grossly normal.

Pancreas: Normal T1 signal in the pancreas without ductal dilation
or sign of inflammation.

Spleen:  Normal in size and contour.

Adrenals/Urinary Tract:  Normal adrenal glands.  No hydronephrosis.

Stomach/Bowel: No sign of bowel obstruction or acute bowel process.
No mesenteric abnormalities. No stranding or wall thickening. Bowel
is evaluated through the mid to lower rectum also related to field
of view constraints. Imaging extends to the level of the acetabulum
and just below the acetabulum on some sequences.

Vascular/Lymphatic: Vascular structures in the abdomen are patent.
No aneurysmal dilation of the abdominal aorta. There is no
gastrohepatic or hepatoduodenal ligament lymphadenopathy. No
retroperitoneal or mesenteric lymphadenopathy.

Smooth contour of the IVC with retroaortic LEFT renal vein.

Reproductive: Post hysterectomy. The pelvis and pelvic floor not
well assessed due to field of view constraints.

Other:  No ascites

Musculoskeletal: No suspicious bone lesions identified.
IMPRESSION: No evidence of acute bowel process from stomach through the mid
rectum with limited assessment of the pelvic floor and upper abdomen
as described.

Visualized psoas and iliacus musculature is unremarkable. Post
contrast images extend through the level of the acetabulum, limited
by field of view constraints.

Given limitations described above and patient history would suggest
recalling the patient for repeat imaging of only the pelvis to
ensure complete coverage. Patient does not need to repeat ingestion
of the oral contrast agent for these repeat images.

ADDENDUM:
Additional imaging to include the entire pelvis was performed.

Urinary Tract: Urinary bladder is normal. No distal ureteral
dilation.

Bowel: No acute bowel process. Specifically no signs of perianal
stranding or thickening.

Vascular/Lymphatic: Vascular structures in the pelvis are patent.
Abdominal imaging previously performed partial imaging of the pelvis
as reported on the prior study. No adenopathy in the pelvis.

Reproductive: Post hysterectomy.  No ascites.  No pelvic mass.

Other: No ascites.

Musculoskeletal: No musculoskeletal abnormality, see dedicated
evaluation of the hip and pelvis reported separately.

Additional Impression: full coverage of the pelvis without visible
acute process and changes of hysterectomy. Please see dedicated MRI
of the pelvis and hip for further detail.

*** End of Addendum ***
FINDINGS: COMBINED FINDINGS FOR BOTH MR ABDOMEN AND PELVIS

Lower chest: Incidental imaging of the lung bases is normal.

Hepatobiliary: Limited assessment of the liver due to field of view
constraints. Liver and biliary tree grossly normal.

Pancreas: Normal T1 signal in the pancreas without ductal dilation
or sign of inflammation.

Spleen:  Normal in size and contour.

Adrenals/Urinary Tract:  Normal adrenal glands.  No hydronephrosis.

Stomach/Bowel: No sign of bowel obstruction or acute bowel process.
No mesenteric abnormalities. No stranding or wall thickening. Bowel
is evaluated through the mid to lower rectum also related to field
of view constraints. Imaging extends to the level of the acetabulum
and just below the acetabulum on some sequences.

Vascular/Lymphatic: Vascular structures in the abdomen are patent.
No aneurysmal dilation of the abdominal aorta. There is no
gastrohepatic or hepatoduodenal ligament lymphadenopathy. No
retroperitoneal or mesenteric lymphadenopathy.

Smooth contour of the IVC with retroaortic LEFT renal vein.

Reproductive: Post hysterectomy. The pelvis and pelvic floor not
well assessed due to field of view constraints.

Other:  No ascites

Musculoskeletal: No suspicious bone lesions identified.
IMPRESSION: No evidence of acute bowel process from stomach through the mid
rectum with limited assessment of the pelvic floor and upper abdomen
as described.

Visualized psoas and iliacus musculature is unremarkable. Post
contrast images extend through the level of the acetabulum, limited
by field of view constraints.

Given limitations described above and patient history would suggest
recalling the patient for repeat imaging of only the pelvis to
ensure complete coverage. Patient does not need to repeat ingestion
of the oral contrast agent for these repeat images.

## 2020-07-13 DIAGNOSIS — Z85828 Personal history of other malignant neoplasm of skin: Secondary | ICD-10-CM | POA: Diagnosis not present

## 2020-07-13 DIAGNOSIS — L814 Other melanin hyperpigmentation: Secondary | ICD-10-CM | POA: Diagnosis not present

## 2020-07-13 DIAGNOSIS — L565 Disseminated superficial actinic porokeratosis (DSAP): Secondary | ICD-10-CM | POA: Diagnosis not present

## 2020-07-23 NOTE — Progress Notes (Unsigned)
[place] Phone: [phone] Subjective:   [name], am serving as a scribe for Dr. Hulan Saas. This visit occurred during the SARS-CoV-2 public health emergency.  Safety protocols were in place, including screening questions prior to the visit, additional usage of staff PPE, and extensive cleaning of exam room while observing appropriate contact time as indicated for disinfecting solutions.   I'm seeing this patient by the request  of:  Plotnikov, [name], MD  CC: Low back pain and left hip pain follow-up  [id number]   05/02/2020 Lower back pain.  Seems to be out of proportion.  Does have a history of endometrial carcinoma.  At this point I would like laboratory work-up to further evaluate to see if anything else that would be contributing.  Patient's x-rays were fairly unremarkable including the hip as well as the back.  Patient still pain seems to be severe.  We discussed again about the potential for a piriformis injection for diagnostic and therapeutic purposes but patient as well as myself feels like we should hold at this moment.  Depending on laboratory work-up then we will discuss either the injection versus the advanced imaging.  Patient is in agreement with the plan.   Update 07/24/2020 [name] is a 65 y.o. female coming in with complaint of back and hip pain. Patient states that her lower back pain has dissipated. Pain is over PSIS and left groin pain especially in the mornings. Pain decreases with motion. Patient recalls burning in left groin 8 years ago.    MRI Pelvis 06/18/2020 IMPRESSION: 1. Caldwell acute osseous abnormality of the pelvis or left hip. 2. Low-grade partial thickness tears of the left hamstring tendon origin. 3. Degeneration with probable tearing of the superior right acetabular labrum.  MIR left hip 06/18/2020 IMPRESSION: 1. Caldwell acute osseous abnormality of the  pelvis or left hip. 2. Low-grade partial thickness tears of the left hamstring tendon origin. 3. Degeneration with probable tearing of the superior right acetabular labrum.  MRI lumbar 06/12/2020 IMPRESSION: Lumbar spine spondylosis most notable at L2-L3 with a left foraminal disc protrusion contacting the exiting left L2 nerve root. Moderate left and mild right neural foraminal narrowing is seen.      Past Medical History:  Diagnosis Date  . ALLERGIC RHINITIS 03/02/2007  . Anemia   . ANXIETY 03/02/2007  . Arthritis   . ASTHMA 11/12/2007   Dr Orvil Feil  . Cancer (HCC)    skin, hx of  . Dysrhythmia    pt. states at night will notice a different heart rhythem  . Endometrial cancer ([place])   . FIBROCYSTIC BREAST DISEASE, HX OF 03/02/2007  . GERD 03/02/2007  . HYPOTHYROIDISM 11/12/2007   Dr Chalmers Cater  . Neuromuscular disorder (HCC)    carpel tunnel syndrome, compression of ulner nerve at elbows  . OSTEOPENIA 11/12/2007  . PEPTIC ULCER DISEASE 03/02/2007  . Pneumonia    hx. of  . Radiation 09/27/15-10/23/15   HDR to vaginal cuff 30 Gy  . Stress fracture   . TMJ SYNDROME 11/12/2007   Past Surgical History:  Procedure Laterality Date  . 2008 TMJ surgery     . CARPAL TUNNEL WITH CUBITAL TUNNEL Left 07/2019  . carpel tunnel Right 11/20/2016  . colonoscopy x 2    . cubital tunnel release Right 11/20/2016  . deviated septum surgery - 2007    . DILATION AND CURETTAGE OF UTERUS  2017  . endoscopy - 1990    . Mandib advancement forward  2009  . ROBOTIC ASSISTED TOTAL HYSTERECTOMY WITH BILATERAL SALPINGO OOPHERECTOMY Bilateral 08/14/2015   Procedure: XI ROBOTIC ASSISTED TOTAL HYSTERECTOMY WITH BILATERAL SALPINGO OOPHORECTOMY AND SENTAL LYMPH NODE BIOPSY;  Surgeon: Everitt Amber, MD;  Location: [facility];  Service: Gynecology;  Laterality: Bilateral;  . 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 Marital status: Married    Spouse name:  Not on file  . Number of children: Not on file  . Years of education: Not on file  . Highest education level: Not on file  Occupational History  . Occupation: Community education officer at [employer] Use  . Smoking status: Never Smoker  . Smokeless tobacco: Never Used  . Tobacco comment: Married, 1 child  Vaping Use  . Vaping Use: Never used  Substance and Sexual Activity  . Alcohol use: Yes    Alcohol/week: 2.0 standard drinks    Types: 1 Glasses of wine, 1 Cans of beer per week    Comment: daily  . Drug use: Caldwell  . Sexual activity: Yes  Other Topics Concern  . Not on file  Social History Narrative  . Not on file   Social Determinants of Health   Financial Resource Strain: Not on file  Food Insecurity: Not on file  Transportation Needs: Not on file  Physical Activity: Not on file  Stress: Not on file  Social Connections: Not on file   Allergies  Allergen Reactions  . Ibuprofen Other (See Comments)    Gastritis   . Shellfish Allergy Other (See Comments)    Abdominal pain and Diarrhea  . Colchicine     diarrhea  . Astroglide [Gyne-Moistrin] Other (See Comments)    Itching and burning  . Demerol Nausea And Vomiting  . Penicillins Hives and Other (See Comments)    Face turned red and had a headache. Has patient had a PCN reaction causing immediate rash, facial/tongue/throat swelling, SOB or lightheadedness with hypotension: Caldwell Has patient had a PCN reaction causing severe rash involving mucus membranes or skin necrosis: Caldwell Has patient had a PCN reaction that required hospitalization: Caldwell Has patient had a PCN reaction occurring within the last 10 years: Caldwell If all of the above answers are "Caldwell", then may proceed with Cephalosporin use.   Family History  Problem Relation Age of Onset  . Hypertension Father   . Heart disease Father 86       chf  . Heart disease Mother 84       CAD  . Hypertension Other   . Asthma Neg Hx     Current Outpatient Medications (Endocrine &  Metabolic):  .  levothyroxine (SYNTHROID, LEVOTHROID) 25 MCG tablet, Take 25-50 mcg by mouth daily. She takes one tablet daily 4 days per week (Monday-Thursday) and two tablets daily 3 days per week (Friday-Sunday). Marland Kitchen  liothyronine (CYTOMEL) 5 MCG tablet, Take 5-10 mcg by mouth 2 (two) times daily. She takes one tablet in the morning and two tablets midday. .  zoledronic acid (RECLAST) 5 MG/100ML SOLN injection, Inject 5 mg into the vein. Once a year  Current Outpatient Medications (Cardiovascular):  Marland Kitchen  EPIPEN 2-PAK 0.3 MG/0.3ML DEVI, Inject 0.3 mg into the muscle once. Reported on 09/03/2015  Current Outpatient Medications (Respiratory):  .  cetirizine (ZYRTEC) 5 MG tablet, Take 5 mg by mouth 2 (two) times daily as needed  for allergies. .  fluticasone (FLONASE) 50 MCG/ACT nasal spray, Place 1 spray into the nose daily as needed for allergies. Reported on 08/06/2015    Current Outpatient Medications (Other):  Marland Kitchen  Calcium Citrate-Vitamin D (CALCIUM CITRATE +D PO), Take 2 tablets by mouth daily.  Marland Kitchen  estradiol (ESTRACE) 0.1 MG/GM vaginal cream, PLACE 1 APPLICATORFUL VAGINALLY 3 (THREE) TIMES A WEEK .  famotidine (PEPCID) 40 MG tablet, Take 1 tablet (40 mg total) by mouth daily. Marland Kitchen  MELATONIN-CHAMOMILE PO, Take 1 capsule by mouth at bedtime as needed and may repeat dose one time if needed (For sleep.).  Marland Kitchen  METRONIDAZOLE, TOPICAL, 0.75 % LOTN, Apply 1 application topically at bedtime. .  minoxidil (ROGAINE) 2 % external solution, Apply 1 application topically 4 (four) times a week.  .  Multiple Vitamin (MULTIVITAMIN WITH MINERALS) TABS tablet, Take 1 tablet by mouth daily. Marland Kitchen  OVER THE COUNTER MEDICATION, Lion's Mane Extract 250 mg. 1 capsuleby mouth daily .  OVER THE COUNTER MEDICATION, Oreganol 140 mg by mouth daily. Marland Kitchen  OVER THE COUNTER MEDICATION, Nature's Made Advanced Dual Action Digestive probiotic  Takes 1 tablet daily .  Pancrelipase, Lip-Prot-Amyl, (ZENPEP) 40000-126000 units CPEP, Take 1  capsule by mouth 3 (three) times daily with meals. .  pyridoxine (B-6) 200 MG tablet, Take 200 mg by mouth daily. Marland Kitchen  venlafaxine XR (EFFEXOR-XR) 37.5 MG 24 hr capsule, TAKE 1 CAPSULE BY MOUTH EVERY DAY WITH BREAKFAST (Patient taking differently: Take 37.5 mg by mouth. 3 times a week)   Reviewed prior external information including notes and imaging from  primary care provider As well as notes that were available from care everywhere and other healthcare systems.  Past medical history, social, surgical and family history all reviewed in electronic medical record.  Caldwell pertanent information unless stated regarding to the chief complaint.   Review of Systems:  Caldwell headache, visual changes, nausea, vomiting, diarrhea, constipation, dizziness, abdominal pain, skin rash, fevers, chills, night sweats, weight loss, swollen lymph nodes, body aches, joint swelling, chest pain, shortness of breath, mood changes. POSITIVE muscle aches  Objective  Blood pressure 112/82, pulse 77, height 5' 4.5" (1.638 m), weight 133 lb (60.3 kg), SpO2 99 %.   General: Caldwell apparent distress alert and oriented x3 mood and affect normal, dressed appropriately.  HEENT: Pupils equal, extraocular movements intact  Respiratory: Patient's speak in full sentences and does not appear short of breath  Cardiovascular: Caldwell lower extremity edema, non tender, Caldwell erythema  Gait normal with good balance and coordination.  MSK: Tightness noted of the left leg compared to the right with straight leg test.  Mild worsening pain with this on the lower back.  Patient does have mild tightness noted more in the thoracolumbar juncture as well as the lumbosacral area.  Patient has a tightness with Corky Sox on the left compared to the right as well.  Very mild weakness of hip flexion with 4 out of 5 strength compared to the contralateral side.    Impression and Recommendations:     The above documentation has been reviewed and is accurate and complete  Lyndal Pulley, DO

## 2020-07-24 ENCOUNTER — Encounter: Payer: Self-pay | Admitting: Family Medicine

## 2020-07-24 ENCOUNTER — Ambulatory Visit (INDEPENDENT_AMBULATORY_CARE_PROVIDER_SITE_OTHER): Payer: BC Managed Care – PPO | Admitting: Family Medicine

## 2020-07-24 ENCOUNTER — Other Ambulatory Visit: Payer: Self-pay

## 2020-07-24 VITALS — BP 112/82 | HR 77 | Ht 64.5 in | Wt 133.0 lb

## 2020-07-24 DIAGNOSIS — G8929 Other chronic pain: Secondary | ICD-10-CM | POA: Diagnosis not present

## 2020-07-24 DIAGNOSIS — M5442 Lumbago with sciatica, left side: Secondary | ICD-10-CM

## 2020-07-24 NOTE — Assessment & Plan Note (Signed)
Patient continues to have more left groin pain.  I am concerned more for the L2 nerve root impingement that was noted on MRI being the consistent because of this.  At this point we discussed with patient about different treatment options including formal physical therapy, intra-articular hip injection to rule out the labral tear of the hip that was noted on MRI for the possibility of a nerve root injection.  Patient has agreed to go along with the nerve root injection.  Patient will follow up with me again in 4 weeks after to discuss.  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  We will see patient back again 4 weeks after the nerve root injection.

## 2020-07-24 NOTE — Patient Instructions (Addendum)
[facility] [phone] See me again 4 weeks after injection to discuss PT

## 2020-07-30 ENCOUNTER — Ambulatory Visit
Admission: RE | Admit: 2020-07-30 | Discharge: 2020-07-30 | Disposition: A | Discharge status: Home / Self Care | Payer: BC Managed Care – PPO | Source: Ambulatory Visit | Attending: Family Medicine | Admitting: Family Medicine

## 2020-07-30 DIAGNOSIS — G8929 Other chronic pain: Secondary | ICD-10-CM

## 2020-07-30 DIAGNOSIS — M5116 Intervertebral disc disorders with radiculopathy, lumbar region: Secondary | ICD-10-CM | POA: Diagnosis not present

## 2020-07-30 MED ORDER — METHYLPREDNISOLONE ACETATE 40 MG/ML INJ SUSP (RADIOLOG
120.0000 mg | Freq: Once | INTRAMUSCULAR | Status: AC
  Administered 2020-07-30: 120 mg via EPIDURAL

## 2020-07-30 MED ORDER — IOPAMIDOL (ISOVUE-M 200) INJECTION 41%
1.0000 mL | Freq: Once | INTRAMUSCULAR | Status: AC
  Administered 2020-07-30: 1 mL via EPIDURAL

## 2020-07-30 NOTE — Discharge Instructions (Signed)

## 2020-08-15 DIAGNOSIS — E559 Vitamin D deficiency, unspecified: Secondary | ICD-10-CM | POA: Diagnosis not present

## 2020-08-15 DIAGNOSIS — E039 Hypothyroidism, unspecified: Secondary | ICD-10-CM | POA: Diagnosis not present

## 2020-08-15 DIAGNOSIS — M810 Age-related osteoporosis without current pathological fracture: Secondary | ICD-10-CM | POA: Diagnosis not present

## 2020-08-17 ENCOUNTER — Encounter: Payer: Self-pay | Admitting: Family Medicine

## 2020-08-29 NOTE — Progress Notes (Signed)
[place] Phone: [phone] Subjective:   [name], am serving as a scribe for Dr. Hulan Saas. This visit occurred during the SARS-CoV-2 public health emergency.  Safety protocols were in place, including screening questions prior to the visit, additional usage of staff PPE, and extensive cleaning of exam room while observing appropriate contact time as indicated for disinfecting solutions.   I'm seeing this patient by the request  of:  Plotnikov, [name], MD  CC: Left groin pain follow-up  [id number]   07/24/2020 Patient continues to have more left groin pain.  I am concerned more for the L2 nerve root impingement that was noted on MRI being the consistent because of this.  At this point we discussed with patient about different treatment options including formal physical therapy, intra-articular hip injection to rule out the labral tear of the hip that was noted on MRI for the possibility of a nerve root injection.  Patient has agreed to go along with the nerve root injection.  Patient will follow up with me again in 4 weeks after to discuss.  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  We will see patient back again 4 weeks after the nerve root injection.  Update 08/30/2020 RAMEEN GOHLKE is a 65 y.o. female coming in with complaint of low back pain. Nerve root injection 07/30/2020. Patient states that she continues to have left groin pain. Injection did not help. Pain is 5/10 when she wakes up. PSIS pain is chronic. Has started PT with Barbaraann Barthel.  Patient does state that the left groin pain does get better when she has a bowel movement.  Patient is concerned that this is still more of the hip flexor than anything else.  Patient did have a nerve root injection at L2 that was consistent  with some of the findings of the groin pain.  Patient states that this did not make significant improvement.  Patient has had significant work-up of this already at this time including MRI of the pelvis, left hip and lumbar spine.     Past Medical History:  Diagnosis Date  . ALLERGIC RHINITIS 03/02/2007  . Anemia   . ANXIETY 03/02/2007  . Arthritis   . ASTHMA 11/12/2007   Dr Orvil Feil  . Cancer (HCC)    skin, hx of  . Dysrhythmia    pt. states at night will notice a different heart rhythem  . Endometrial cancer ([place])   . FIBROCYSTIC BREAST DISEASE, HX OF 03/02/2007  . GERD 03/02/2007  . HYPOTHYROIDISM 11/12/2007   Dr Chalmers Cater  . Neuromuscular disorder (HCC)    carpel tunnel syndrome, compression of ulner nerve at elbows  . OSTEOPENIA 11/12/2007  . PEPTIC ULCER DISEASE 03/02/2007  . Pneumonia    hx. of  . Radiation 09/27/15-10/23/15   HDR to vaginal cuff 30 Gy  . Stress fracture   . TMJ SYNDROME 11/12/2007   Past Surgical History:  Procedure Laterality Date  . 2008 TMJ surgery     . CARPAL TUNNEL WITH CUBITAL TUNNEL Left 07/2019  . carpel tunnel Right 11/20/2016  . colonoscopy x 2    . cubital tunnel release Right 11/20/2016  . deviated septum surgery - 2007    . DILATION AND CURETTAGE OF UTERUS     2017  . endoscopy -  [place] advancement forward  2009  . ROBOTIC ASSISTED TOTAL HYSTERECTOMY WITH BILATERAL SALPINGO OOPHERECTOMY Bilateral 08/14/2015   Procedure: XI ROBOTIC ASSISTED TOTAL HYSTERECTOMY WITH BILATERAL SALPINGO OOPHORECTOMY AND SENTAL LYMPH NODE BIOPSY;  Surgeon: Everitt Amber, MD;  Location: [facility];  Service: Gynecology;  Laterality: Bilateral;  . 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 Marital status: Married    Spouse name: Not on file  . Number of children: Not on file  . Years of education: Not on file  . Highest education level: Not on file  Occupational History  . 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 Smoking status: Never Smoker  . Smokeless tobacco: Never Used  . Tobacco comment: Married, 1 child  Vaping Use  . Vaping Use: Never used  Substance and Sexual Activity  . Alcohol use: Yes    Alcohol/week: 2.0 standard drinks    Types: 1 Glasses of wine, 1 Cans of beer per week    Comment: daily  . Drug use: No  . Sexual activity: Yes  Other Topics Concern  . Not on file  Social History Narrative  . Not on file   Social Determinants of Health   Financial Resource Strain: Not on file  Food Insecurity: Not on file  Transportation Needs: Not on file  Physical Activity: Not on file  Stress: Not on file  Social Connections: Not on file   Allergies  Allergen Reactions  . Ibuprofen Other (See Comments)    Gastritis   . Shellfish Allergy Other (See Comments)    Abdominal pain and Diarrhea  . Colchicine     diarrhea  . Astroglide [Gyne-Moistrin] Other (See Comments)    Itching and burning  . Demerol Nausea And Vomiting  . Penicillins Hives and Other (See Comments)    Face turned red and had a headache. Has patient had a PCN reaction causing immediate rash, facial/tongue/throat swelling, SOB or lightheadedness with hypotension: no Has patient had a PCN reaction causing severe rash involving mucus membranes or skin necrosis: no Has patient had a PCN reaction that required hospitalization: no Has patient had a PCN reaction occurring within the last 10 years: no If all of the above answers are "NO", then may proceed with Cephalosporin use.   Family History  Problem Relation Age of Onset  . Hypertension Father   . Heart disease Father 80       chf  . Heart disease Mother 55       CAD  . Hypertension Other   . Asthma Neg Hx     Current Outpatient Medications (Endocrine & Metabolic):  .  levothyroxine (SYNTHROID, LEVOTHROID) 25 MCG tablet, Take 25-50 mcg by mouth daily. She takes one tablet daily 4 days per week (Monday-Thursday) and two tablets daily 3  days per week (Friday-Sunday). Marland Kitchen  liothyronine (CYTOMEL) 5 MCG tablet, Take 5-10 mcg by mouth 2 (two) times daily. She takes one tablet in the morning and two tablets midday. .  zoledronic acid (RECLAST) 5 MG/100ML SOLN injection, Inject 5 mg into the vein. Once a year  Current Outpatient Medications (Cardiovascular):  Marland Kitchen  EPIPEN 2-PAK 0.3 MG/0.3ML DEVI, Inject 0.3 mg into the muscle once. Reported on 09/03/2015  Current Outpatient Medications (Respiratory):  .  cetirizine (ZYRTEC) 5 MG tablet, Take 5 mg by mouth 2 (two) times daily as needed for allergies. .  fluticasone (  FLONASE) 50 MCG/ACT nasal spray, Place 1 spray into the nose daily as needed for allergies. Reported on 08/06/2015    Current Outpatient Medications (Other):  Marland Kitchen  Calcium Citrate-Vitamin D (CALCIUM CITRATE +D PO), Take 2 tablets by mouth daily.  Marland Kitchen  estradiol (ESTRACE) 0.1 MG/GM vaginal cream, PLACE 1 APPLICATORFUL VAGINALLY 3 (THREE) TIMES A WEEK .  famotidine (PEPCID) 40 MG tablet, Take 1 tablet (40 mg total) by mouth daily. Marland Kitchen  MELATONIN-CHAMOMILE PO, Take 1 capsule by mouth at bedtime as needed and may repeat dose one time if needed (For sleep.).  Marland Kitchen  METRONIDAZOLE, TOPICAL, 0.75 % LOTN, Apply 1 application topically at bedtime. .  minoxidil (ROGAINE) 2 % external solution, Apply 1 application topically 4 (four) times a week.  .  Multiple Vitamin (MULTIVITAMIN WITH MINERALS) TABS tablet, Take 1 tablet by mouth daily. Marland Kitchen  OVER THE COUNTER MEDICATION, Lion's Mane Extract 250 mg. 1 capsuleby mouth daily .  OVER THE COUNTER MEDICATION, Oreganol 140 mg by mouth daily. Marland Kitchen  OVER THE COUNTER MEDICATION, Nature's Made Advanced Dual Action Digestive probiotic  Takes 1 tablet daily .  Pancrelipase, Lip-Prot-Amyl, (ZENPEP) 40000-126000 units CPEP, Take 1 capsule by mouth 3 (three) times daily with meals. .  pyridoxine (B-6) 200 MG tablet, Take 200 mg by mouth daily. Marland Kitchen  venlafaxine XR (EFFEXOR-XR) 37.5 MG 24 hr capsule, TAKE 1 CAPSULE BY  MOUTH EVERY DAY WITH BREAKFAST (Patient taking differently: Take 37.5 mg by mouth. 3 times a week)   Reviewed prior external information including notes and imaging from  primary care provider As well as notes that were available from care everywhere and other healthcare systems.  Past medical history, social, surgical and family history all reviewed in electronic medical record.  No pertanent information unless stated regarding to the chief complaint.   Review of Systems:  No headache, visual changes, nausea, vomiting, diarrhea, constipation, dizziness, abdominal pain, skin rash, fevers, chills, night sweats, weight loss, swollen lymph nodes, body aches, joint swelling, chest pain, shortness of breath, mood changes. POSITIVE muscle aches  Objective  Blood pressure 108/76, pulse 66, height 5' 4.5" (1.638 m), weight 135 lb (61.2 kg), SpO2 97 %.   General: No apparent distress alert and oriented x3 mood and affect normal, dressed appropriately.  HEENT: Pupils equal, extraocular movements intact  Respiratory: Patient's speak in full sentences and does not appear short of breath  Cardiovascular: No lower extremity edema, non tender, no erythema  Gait normal with good balance and coordination.  MSK: On exam today patient's left hip still has tenderness to palpation around the left sacroiliac joint as well as in the gluteal muscle itself.  No masses appreciated.  Does have tightness noted of the hamstrings mildly bilaterally.  Patient is sore in the inguinal area but no masses appreciated on the left hip.  Very mild pain with internal rotation.  Mild pain also with external rotation more though on the lateral aspect of the hip.  Neurovascularly intact distally.  Mild tightness of the hip flexor on the left compared to the right.    Impression and Recommendations:     The above documentation has been reviewed and is accurate and complete Lyndal Pulley, DO

## 2020-08-30 ENCOUNTER — Encounter: Payer: Self-pay | Admitting: Family Medicine

## 2020-08-30 ENCOUNTER — Ambulatory Visit (INDEPENDENT_AMBULATORY_CARE_PROVIDER_SITE_OTHER): Payer: BC Managed Care – PPO | Admitting: Family Medicine

## 2020-08-30 ENCOUNTER — Other Ambulatory Visit: Payer: Self-pay

## 2020-08-30 DIAGNOSIS — M5442 Lumbago with sciatica, left side: Secondary | ICD-10-CM | POA: Diagnosis not present

## 2020-08-30 DIAGNOSIS — G8929 Other chronic pain: Secondary | ICD-10-CM

## 2020-08-30 NOTE — Patient Instructions (Signed)
Sorry you are not completely better Let's hope this is psoas and L2 nerve root like it did many years ago Continue to be careful at work If anything changes send message  See me again in 8 weeks

## 2020-08-30 NOTE — Assessment & Plan Note (Signed)
Continues to have the low back pain.  We did do an MRI that did show an L2 nerve root impingement.  Attempted a nerve root injection with very minimal response.  Patient continues to have some tightness noted of more of the hip flexor and is going to work with physical therapy which I am hopeful will help.  Patient states that she did have a pain like this multiple years ago that did get better with stretching of the psoas muscle.  This could be potentially the same thing or potentially the patient did have a partial protruding disc at that time as well that did resolve and I am hopeful that this can happen again.  Encourage patient to take the Tylenol on a more regular basis we discussed the potential other possibilities of either seeing gastroenterology secondary to some association with bowel movements helping as well as the possibility of general surgery to ensure there is no occult hernia but nothing was palpated today.  Continue to stay active and work on core strength and see me again in 6 to 8 weeks

## 2020-09-30 ENCOUNTER — Other Ambulatory Visit: Payer: Self-pay | Admitting: Internal Medicine

## 2020-10-08 ENCOUNTER — Encounter: Payer: Self-pay | Admitting: Family Medicine

## 2020-10-15 DIAGNOSIS — L570 Actinic keratosis: Secondary | ICD-10-CM | POA: Diagnosis not present

## 2020-10-15 DIAGNOSIS — L814 Other melanin hyperpigmentation: Secondary | ICD-10-CM | POA: Diagnosis not present

## 2020-10-15 DIAGNOSIS — L821 Other seborrheic keratosis: Secondary | ICD-10-CM | POA: Diagnosis not present

## 2020-10-15 DIAGNOSIS — D2261 Melanocytic nevi of right upper limb, including shoulder: Secondary | ICD-10-CM | POA: Diagnosis not present

## 2020-10-15 DIAGNOSIS — Z85828 Personal history of other malignant neoplasm of skin: Secondary | ICD-10-CM | POA: Diagnosis not present

## 2020-10-15 NOTE — Progress Notes (Signed)
[name] Phone: [phone] Subjective:   [name], am serving as a scribe for Dr. Hulan Saas. This visit occurred during the SARS-CoV-2 public health emergency.  Safety protocols were in place, including screening questions prior to the visit, additional usage of staff PPE, and extensive cleaning of exam room while observing appropriate contact time as indicated for disinfecting solutions.   I'm seeing this patient by the request  of:  Plotnikov, [name], MD  CC: back pain follow up   IOX:[id number]   08/30/2020 Continues to have the low back pain.  We did do an MRI that did show an L2 nerve root impingement.  Attempted a nerve root injection with very minimal response.  Patient continues to have some tightness noted of more of the hip flexor and is going to work with physical therapy which I am hopeful will help.  Patient states that she did have a pain like this multiple years ago that did get better with stretching of the psoas muscle.  This could be potentially the same thing or potentially the patient did have a partial protruding disc at that time as well that did resolve and I am hopeful that this can happen again.  Encourage patient to take the Tylenol on a more regular basis we discussed the potential other possibilities of either seeing gastroenterology secondary to some association with bowel movements helping as well as the possibility of general surgery to ensure there is Caldwell occult hernia but nothing was palpated today.  Continue to stay active and work on core strength and see me again in 6 to 8 weeks  Update 10/16/2020 IZABELLAH Caldwell is a 65 y.o. female coming in with complaint of LBP. Patient has been doing physical therapy. States that L PSIS remains painful in recent MyChart message. Patient has stopped physical therapy as she was only getting 1 day of relief. Patient using pelvic stability strap  that helps alleviate some of her pain.    Patient underwent an epidural 07/30/20 After MRI in 12/21 showed left side nerve root impingement     Past Medical History:  Diagnosis Date  . ALLERGIC RHINITIS 03/02/2007  . Anemia   . ANXIETY 03/02/2007  . Arthritis   . ASTHMA 11/12/2007   Dr Orvil Feil  . Cancer (HCC)    skin, hx of  . Dysrhythmia    pt. states at night will notice a different heart rhythem  . Endometrial cancer ([place])   . FIBROCYSTIC BREAST DISEASE, HX OF 03/02/2007  . GERD 03/02/2007  . HYPOTHYROIDISM 11/12/2007   Dr Chalmers Cater  . Neuromuscular disorder (HCC)    carpel tunnel syndrome, compression of ulner nerve at elbows  . OSTEOPENIA 11/12/2007  . PEPTIC ULCER DISEASE 03/02/2007  . Pneumonia    hx. of  . Radiation 09/27/15-10/23/15   HDR to vaginal cuff 30 Gy  . Stress fracture   . TMJ SYNDROME 11/12/2007   Past Surgical History:  Procedure Laterality Date  . 2008 TMJ surgery     . CARPAL TUNNEL WITH CUBITAL TUNNEL Left 07/2019  . carpel tunnel Right 11/20/2016  . colonoscopy x 2    . cubital tunnel release Right 11/20/2016  . deviated septum surgery - 2007    . DILATION AND CURETTAGE OF UTERUS     2017  . endoscopy - 1990    . Mandib advancement forward  2009  . ROBOTIC ASSISTED TOTAL HYSTERECTOMY WITH BILATERAL SALPINGO  OOPHERECTOMY Bilateral 08/14/2015   Procedure: XI ROBOTIC ASSISTED TOTAL HYSTERECTOMY WITH BILATERAL SALPINGO OOPHORECTOMY AND SENTAL LYMPH NODE BIOPSY;  Surgeon: Everitt Amber, MD;  Location: [facility];  Service: Gynecology;  Laterality: Bilateral;  . 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 Marital status: Married    Spouse name: Not on file  . Number of children: Not on file  . Years of education: Not on file  . Highest education level: Not on file  Occupational History  . 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 Smoking status: Never Smoker  . Smokeless tobacco: Never Used   . Tobacco comment: Married, 1 child  Vaping Use  . Vaping Use: Never used  Substance and Sexual Activity  . Alcohol use: Yes    Alcohol/week: 2.0 standard drinks    Types: 1 Glasses of wine, 1 Cans of beer per week    Comment: daily  . Drug use: Caldwell  . Sexual activity: Yes  Other Topics Concern  . Not on file  Social History Narrative  . Not on file   Social Determinants of Health   Financial Resource Strain: Not on file  Food Insecurity: Not on file  Transportation Needs: Not on file  Physical Activity: Not on file  Stress: Not on file  Social Connections: Not on file   Allergies  Allergen Reactions  . Ibuprofen Other (See Comments)    Gastritis   . Shellfish Allergy Other (See Comments)    Abdominal pain and Diarrhea  . Colchicine     diarrhea  . Astroglide [Gyne-Moistrin] Other (See Comments)    Itching and burning  . Demerol Nausea And Vomiting  . Penicillins Hives and Other (See Comments)    Face turned red and had a headache. Has patient had a PCN reaction causing immediate rash, facial/tongue/throat swelling, SOB or lightheadedness with hypotension: Caldwell Has patient had a PCN reaction causing severe rash involving mucus membranes or skin necrosis: Caldwell Has patient had a PCN reaction that required hospitalization: Caldwell Has patient had a PCN reaction occurring within the last 10 years: Caldwell If all of the above answers are "Caldwell", then may proceed with Cephalosporin use.   Family History  Problem Relation Age of Onset  . Hypertension Father   . Heart disease Father 6       chf  . Heart disease Mother 62       CAD  . Hypertension Other   . Asthma Neg Hx     Current Outpatient Medications (Endocrine & Metabolic):  .  levothyroxine (SYNTHROID, LEVOTHROID) 25 MCG tablet, Take 25-50 mcg by mouth daily. She takes one tablet daily 4 days per week (Monday-Thursday) and two tablets daily 3 days per week (Friday-Sunday). Marland Kitchen  liothyronine (CYTOMEL) 5 MCG tablet, Take 5-10 mcg  by mouth 2 (two) times daily. She takes one tablet in the morning and two tablets midday. .  zoledronic acid (RECLAST) 5 MG/100ML SOLN injection, Inject 5 mg into the vein. Once a year  Current Outpatient Medications (Cardiovascular):  Marland Kitchen  EPIPEN 2-PAK 0.3 MG/0.3ML DEVI, Inject 0.3 mg into the muscle once. Reported on 09/03/2015  Current Outpatient Medications (Respiratory):  .  cetirizine (ZYRTEC) 5 MG tablet, Take 5 mg by mouth 2 (two) times daily as needed for allergies. .  fluticasone (FLONASE) 50 MCG/ACT nasal spray, Place 1 spray into the nose daily as needed for allergies. Reported on 08/06/2015  Current Outpatient Medications (Other):  Marland Kitchen  Calcium Citrate-Vitamin D (CALCIUM CITRATE +D PO), Take 2 tablets by mouth daily.  Marland Kitchen  estradiol (ESTRACE) 0.1 MG/GM vaginal cream, PLACE 1 APPLICATORFUL VAGINALLY 3 (THREE) TIMES A WEEK .  famotidine (PEPCID) 40 MG tablet, Take 1 tablet (40 mg total) by mouth daily. Marland Kitchen  MELATONIN-CHAMOMILE PO, Take 1 capsule by mouth at bedtime as needed and may repeat dose one time if needed (For sleep.).  Marland Kitchen  METRONIDAZOLE, TOPICAL, 0.75 % LOTN, Apply 1 application topically at bedtime. .  minoxidil (ROGAINE) 2 % external solution, Apply 1 application topically 4 (four) times a week.  .  Multiple Vitamin (MULTIVITAMIN WITH MINERALS) TABS tablet, Take 1 tablet by mouth daily. Marland Kitchen  OVER THE COUNTER MEDICATION, Lion's Mane Extract 250 mg. 1 capsuleby mouth daily .  OVER THE COUNTER MEDICATION, Oreganol 140 mg by mouth daily. Marland Kitchen  OVER THE COUNTER MEDICATION, Nature's Made Advanced Dual Action Digestive probiotic  Takes 1 tablet daily .  Pancrelipase, Lip-Prot-Amyl, (ZENPEP) 40000-126000 units CPEP, Take 1 capsule three times a day Annual appt is due in must see provider for future refills .  pyridoxine (B-6) 200 MG tablet, Take 200 mg by mouth daily. Marland Kitchen  venlafaxine XR (EFFEXOR-XR) 37.5 MG 24 hr capsule, TAKE 1 CAPSULE BY MOUTH EVERY DAY WITH BREAKFAST (Patient taking  differently: Take 37.5 mg by mouth. 3 times a week)   Reviewed prior external information including notes and imaging from  primary care provider As well as notes that were available from care everywhere and other healthcare systems.  Past medical history, social, surgical and family history all reviewed in electronic medical record.  Caldwell pertanent information unless stated regarding to the chief complaint.   Review of Systems:  Caldwell headache, visual changes, nausea, vomiting, diarrhea, constipation, dizziness, abdominal pain, skin rash, fevers, chills, night sweats, weight loss, swollen lymph nodes, body aches, joint swelling, chest pain, shortness of breath, mood changes. POSITIVE muscle aches  Objective  Blood pressure 120/76, pulse 76, height 5' 4.5" (1.638 m), weight 136 lb (61.7 kg), SpO2 96 %.   General: Caldwell apparent distress alert and oriented x3 mood and affect normal, dressed appropriately.  HEENT: Pupils equal, extraocular movements intact  Respiratory: Patient's speak in full sentences and does not appear short of breath  Cardiovascular: Caldwell lower extremity edema, non tender, Caldwell erythema  Gait normal with good balance and coordination.  MSK: Back exam shows more tenderness over the left sacroiliac joint.  Mild positive FABER test on the left.  Patient does have pain with straight leg test as well or any type of extension more of the back.  Procedure: Real-time Ultrasound Guided Injection of left sacroiliac joint Device: GE Logiq Q7 Ultrasound guided injection is preferred based studies that show increased duration, increased effect, greater accuracy, decreased procedural pain, increased response rate, and decreased cost with ultrasound guided versus blind injection.  Verbal informed consent obtained.  Time-out conducted.  Noted Caldwell overlying erythema, induration, or other signs of local infection.  Skin prepped in a sterile fashion.  Local anesthesia: Topical Ethyl chloride.  With  sterile technique and under real time ultrasound guidance: With a 21-gauge 2 inch needle injected into the left sacroiliac joint under ultrasound guidance with a total of 0.5 cc of 0.5% Marcaine and 0.5 cc of Kenalog 40 mg/mL. Completed without difficulty  Pain immediately improved suggesting accurate placement of the medication.  Advised to call if fevers/chills, erythema, induration, drainage, or persistent bleeding.  Impression: Technically  successful ultrasound guided injection.    Impression and Recommendations:     The above documentation has been reviewed and is accurate and complete Lyndal Pulley, DO

## 2020-10-16 ENCOUNTER — Encounter: Payer: Self-pay | Admitting: Family Medicine

## 2020-10-16 ENCOUNTER — Ambulatory Visit (INDEPENDENT_AMBULATORY_CARE_PROVIDER_SITE_OTHER): Payer: BC Managed Care – PPO | Admitting: Family Medicine

## 2020-10-16 ENCOUNTER — Ambulatory Visit: Payer: Self-pay

## 2020-10-16 ENCOUNTER — Other Ambulatory Visit: Payer: Self-pay

## 2020-10-16 VITALS — BP 120/76 | HR 76 | Ht 64.5 in | Wt 136.0 lb

## 2020-10-16 DIAGNOSIS — M533 Sacrococcygeal disorders, not elsewhere classified: Secondary | ICD-10-CM

## 2020-10-16 DIAGNOSIS — G8929 Other chronic pain: Secondary | ICD-10-CM

## 2020-10-16 NOTE — Assessment & Plan Note (Addendum)
Patient given injection today.  Tolerated the procedure well.  We will do this for diagnostic and potential therapeutic purposes.  Patient did have some alleviation of the pain almost immediately.  Discussed which activities to do which wants to avoid.  Discussed with patient if this seems to worsen again I would like to consider the possibility for the nerve root injection but patient had previously.  Patient did have some relief of that but long term.  Patient will follow up with me otherwise in 6 to 8 weeks.

## 2020-10-16 NOTE — Patient Instructions (Signed)
Tart cherry extract 1200mg  at night See me again in 6 weeks but send message on Monday

## 2020-10-24 ENCOUNTER — Ambulatory Visit: Payer: BC Managed Care – PPO | Admitting: Family Medicine

## 2020-11-27 NOTE — Progress Notes (Signed)
[place] Phone: [phone] Subjective:   [name], am serving as a scribe for Dr. Hulan Saas. This visit occurred during the SARS-CoV-2 public health emergency.  Safety protocols were in place, including screening questions prior to the visit, additional usage of staff PPE, and extensive cleaning of exam room while observing appropriate contact time as indicated for disinfecting solutions.   I'm seeing this patient by the request  of:  Plotnikov, [name], MD  CC: Left lower back pain.  BWI:[id number]   10/16/2020 Patient given injection today.  Tolerated the procedure well.  We will do this for diagnostic and potential therapeutic purposes.  Patient did have some alleviation of the pain almost immediately.  Discussed which activities to do which wants to avoid.  Discussed with patient if this seems to worsen again I would like to consider the possibility for the nerve root injection but patient had previously.  Patient did have some relief of that but long term.  Patient will follow up with me otherwise in 6 to 8 weeks.  Update 11/28/2020 [name] is a 65 y.o. female coming in with complaint of L SI joint pain. Patient states that the injection worked for 10 days and then the pain came back. Continues to use tart cherry extract daily. Pain has returned to how it was before injection.  Patient is wondering what the next step is at this point.  Would like to not have as much pain as she has had previously.       Past Medical History:  Diagnosis Date  . ALLERGIC RHINITIS 03/02/2007  . Anemia   . ANXIETY 03/02/2007  . Arthritis   . ASTHMA 11/12/2007   Dr Orvil Feil  . Cancer (HCC)    skin, hx of  . Dysrhythmia    pt. states at night will notice a different heart rhythem  . Endometrial cancer ([place])   . FIBROCYSTIC BREAST DISEASE, HX OF 03/02/2007  . GERD 03/02/2007  . HYPOTHYROIDISM 11/12/2007   Dr Chalmers Cater  .  Neuromuscular disorder (HCC)    carpel tunnel syndrome, compression of ulner nerve at elbows  . OSTEOPENIA 11/12/2007  . PEPTIC ULCER DISEASE 03/02/2007  . Pneumonia    hx. of  . Radiation 09/27/15-10/23/15   HDR to vaginal cuff 30 Gy  . Stress fracture   . TMJ SYNDROME 11/12/2007   Past Surgical History:  Procedure Laterality Date  . 2008 TMJ surgery     . CARPAL TUNNEL WITH CUBITAL TUNNEL Left 07/2019  . carpel tunnel Right 11/20/2016  . colonoscopy x 2    . cubital tunnel release Right 11/20/2016  . deviated septum surgery - 2007    . DILATION AND CURETTAGE OF UTERUS     2017  . endoscopy - 1990    . Mandib advancement forward  2009  . ROBOTIC ASSISTED TOTAL HYSTERECTOMY WITH BILATERAL SALPINGO OOPHERECTOMY Bilateral 08/14/2015   Procedure: XI ROBOTIC ASSISTED TOTAL HYSTERECTOMY WITH BILATERAL SALPINGO OOPHORECTOMY AND SENTAL LYMPH NODE BIOPSY;  Surgeon: Everitt Amber, MD;  Location: [facility];  Service: Gynecology;  Laterality: Bilateral;  . 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 Marital status: Married    Spouse name: Not on file  . Number of children: Not on file  . Years of education: Not on file  . Highest education level: Not on file  Occupational  History  . 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 Smoking status: Never Smoker  . Smokeless tobacco: Never Used  . Tobacco comment: Married, 1 child  Vaping Use  . Vaping Use: Never used  Substance and Sexual Activity  . Alcohol use: Yes    Alcohol/week: 2.0 standard drinks    Types: 1 Glasses of wine, 1 Cans of beer per week    Comment: daily  . Drug use: Caldwell  . Sexual activity: Yes  Other Topics Concern  . Not on file  Social History Narrative  . Not on file   Social Determinants of Health   Financial Resource Strain: Not on file  Food Insecurity: Not on file  Transportation Needs: Not on file  Physical Activity: Not on file  Stress:  Not on file  Social Connections: Not on file   Allergies  Allergen Reactions  . Ibuprofen Other (See Comments)    Gastritis   . Shellfish Allergy Other (See Comments)    Abdominal pain and Diarrhea  . Colchicine     diarrhea  . Astroglide [Gyne-Moistrin] Other (See Comments)    Itching and burning  . Demerol Nausea And Vomiting  . Penicillins Hives and Other (See Comments)    Face turned red and had a headache. Has patient had a PCN reaction causing immediate rash, facial/tongue/throat swelling, SOB or lightheadedness with hypotension: Caldwell Has patient had a PCN reaction causing severe rash involving mucus membranes or skin necrosis: Caldwell Has patient had a PCN reaction that required hospitalization: Caldwell Has patient had a PCN reaction occurring within the last 10 years: Caldwell If all of the above answers are "Caldwell", then may proceed with Cephalosporin use.   Family History  Problem Relation Age of Onset  . Hypertension Father   . Heart disease Father 33       chf  . Heart disease Mother 41       CAD  . Hypertension Other   . Asthma Neg Hx     Current Outpatient Medications (Endocrine & Metabolic):  .  levothyroxine (SYNTHROID, LEVOTHROID) 25 MCG tablet, Take 25-50 mcg by mouth daily. She takes one tablet daily 4 days per week (Monday-Thursday) and two tablets daily 3 days per week (Friday-Sunday). Marland Kitchen  liothyronine (CYTOMEL) 5 MCG tablet, Take 5-10 mcg by mouth 2 (two) times daily. She takes one tablet in the morning and two tablets midday. .  zoledronic acid (RECLAST) 5 MG/100ML SOLN injection, Inject 5 mg into the vein. Once a year  Current Outpatient Medications (Cardiovascular):  Marland Kitchen  EPIPEN 2-PAK 0.3 MG/0.3ML DEVI, Inject 0.3 mg into the muscle once. Reported on 09/03/2015  Current Outpatient Medications (Respiratory):  .  cetirizine (ZYRTEC) 5 MG tablet, Take 5 mg by mouth 2 (two) times daily as needed for allergies. .  fluticasone (FLONASE) 50 MCG/ACT nasal spray, Place 1 spray into  the nose daily as needed for allergies. Reported on 08/06/2015    Current Outpatient Medications (Other):  Marland Kitchen  Calcium Citrate-Vitamin D (CALCIUM CITRATE +D PO), Take 2 tablets by mouth daily.  .  Diclofenac Sodium 2 % SOLN, Place 2 g onto the skin 2 (two) times daily. Marland Kitchen  estradiol (ESTRACE) 0.1 MG/GM vaginal cream, PLACE 1 APPLICATORFUL VAGINALLY 3 (THREE) TIMES A WEEK .  famotidine (PEPCID) 40 MG tablet, Take 1 tablet (40 mg total) by mouth daily. Marland Kitchen  MELATONIN-CHAMOMILE PO, Take 1 capsule by mouth at bedtime as needed and may repeat dose one time if needed (  For sleep.).  Marland Kitchen  METRONIDAZOLE, TOPICAL, 0.75 % LOTN, Apply 1 application topically at bedtime. .  minoxidil (ROGAINE) 2 % external solution, Apply 1 application topically 4 (four) times a week.  .  Multiple Vitamin (MULTIVITAMIN WITH MINERALS) TABS tablet, Take 1 tablet by mouth daily. Marland Kitchen  OVER THE COUNTER MEDICATION, Lion's Mane Extract 250 mg. 1 capsuleby mouth daily .  OVER THE COUNTER MEDICATION, Oreganol 140 mg by mouth daily. Marland Kitchen  OVER THE COUNTER MEDICATION, Nature's Made Advanced Dual Action Digestive probiotic  Takes 1 tablet daily .  Pancrelipase, Lip-Prot-Amyl, (ZENPEP) 40000-126000 units CPEP, Take 1 capsule three times a day Annual appt is due in must see provider for future refills .  pyridoxine (B-6) 200 MG tablet, Take 200 mg by mouth daily. Marland Kitchen  venlafaxine XR (EFFEXOR-XR) 37.5 MG 24 hr capsule, TAKE 1 CAPSULE BY MOUTH EVERY DAY WITH BREAKFAST (Patient taking differently: Take 37.5 mg by mouth. 3 times a week)   Reviewed prior external information including notes and imaging from  primary care provider As well as notes that were available from care everywhere and other healthcare systems.  Past medical history, social, surgical and family history all reviewed in electronic medical record.  Caldwell pertanent information unless stated regarding to the chief complaint.   Review of Systems:  Caldwell headache, visual changes, nausea,  vomiting, diarrhea, constipation, dizziness, abdominal pain, skin rash, fevers, chills, night sweats, weight loss, swollen lymph nodes, body aches, joint swelling, chest pain, shortness of breath, mood changes. POSITIVE muscle aches  Objective  Blood pressure 90/70, pulse 98, height 5' 4.5" (1.638 m), weight 135 lb (61.2 kg), SpO2 98 %.   General: Caldwell apparent distress alert and oriented x3 mood and affect normal, dressed appropriately.  HEENT: Pupils equal, extraocular movements intact  Respiratory: Patient's speak in full sentences and does not appear short of breath  Cardiovascular: Caldwell lower extremity edema, non tender, Caldwell erythema  Gait normal with good balance and coordination.  MSK: Left leg shows some mild tenderness still over the sacroiliac joint and some of the paraspinal musculature of the lumbar spine.  Good strength on the lower extremities.  Mild discomfort with FABER test.  Caldwell radicular symptoms with straight leg test.    Impression and Recommendations:     The above documentation has been reviewed and is accurate and complete Lyndal Pulley, DO

## 2020-11-28 ENCOUNTER — Encounter: Payer: Self-pay | Admitting: Family Medicine

## 2020-11-28 ENCOUNTER — Other Ambulatory Visit: Payer: Self-pay

## 2020-11-28 ENCOUNTER — Telehealth: Payer: Self-pay | Admitting: Family Medicine

## 2020-11-28 ENCOUNTER — Ambulatory Visit (INDEPENDENT_AMBULATORY_CARE_PROVIDER_SITE_OTHER): Payer: BC Managed Care – PPO | Admitting: Family Medicine

## 2020-11-28 DIAGNOSIS — M533 Sacrococcygeal disorders, not elsewhere classified: Secondary | ICD-10-CM | POA: Diagnosis not present

## 2020-11-28 MED ORDER — DICLOFENAC SODIUM 2 % EX SOLN: 2.0000 g | Freq: Two times a day (BID) | CUTANEOUS | 3 refills | Status: AC

## 2020-11-28 NOTE — Telephone Encounter (Signed)
Spoke with pharmacist and per a verbal from Dr. Tamala Julian patient is ok to obtain Rx.

## 2020-11-28 NOTE — Telephone Encounter (Signed)
[facility] called regarding the prescription they received for Pensaid. They said that it was noted that she has an allergy to Ibuprofen.   Please advise.

## 2020-11-28 NOTE — Assessment & Plan Note (Addendum)
Patient did respond and did have about an 80 to 90% improvement after the sacroiliac injection on the left side.  Patient has had MRIs that did not show anything significant but ultrasound did show the patient did have a small effusion in the area.  We discussed different treatment options at this time.  We discussed the possibility of PRP but this would be an experimental injection versus the possibility of referral to orthopedic surgery to discuss potential sacral fusion.  Patient did not respond well to epidural of the back and has not responded extremely well to different medications.  Patient will follow up with me again when she decides what type of treatment she would like to do total time with patient today as well as reviewing patient's previous notes as well as imaging including the MRI and the injections and ultrasound greater than 33 minutes.Marland Kitchen

## 2020-11-28 NOTE — Patient Instructions (Signed)
PRP handout [place] If you want to do PRP or Dr. Lynann Bologna, write [place] Stay active and continue exercises

## 2020-12-14 DIAGNOSIS — Z6823 Body mass index (BMI) 23.0-23.9, adult: Secondary | ICD-10-CM | POA: Diagnosis not present

## 2020-12-14 DIAGNOSIS — E8941 Symptomatic postprocedural ovarian failure: Secondary | ICD-10-CM | POA: Diagnosis not present

## 2020-12-14 DIAGNOSIS — Z01419 Encounter for gynecological examination (general) (routine) without abnormal findings: Secondary | ICD-10-CM | POA: Diagnosis not present

## 2021-01-01 ENCOUNTER — Encounter: Payer: Self-pay | Admitting: Family Medicine

## 2021-01-30 ENCOUNTER — Other Ambulatory Visit: Payer: Self-pay | Admitting: Internal Medicine

## 2021-02-04 ENCOUNTER — Other Ambulatory Visit: Payer: Self-pay

## 2021-02-04 ENCOUNTER — Ambulatory Visit: Payer: Self-pay

## 2021-02-04 ENCOUNTER — Ambulatory Visit: Payer: Self-pay | Admitting: Family Medicine

## 2021-02-04 VITALS — BP 120/72 | HR 65 | Ht 64.5 in | Wt 134.0 lb

## 2021-02-04 DIAGNOSIS — M533 Sacrococcygeal disorders, not elsewhere classified: Secondary | ICD-10-CM

## 2021-02-04 DIAGNOSIS — G8929 Other chronic pain: Secondary | ICD-10-CM

## 2021-02-04 NOTE — Progress Notes (Signed)
[place] [facility] Phone: [phone] Subjective:   [name], am serving as a scribe for Dr. Hulan Saas. This visit occurred during the SARS-CoV-2 public health emergency.  Safety protocols were in place, including screening questions prior to the visit, additional usage of staff PPE, and extensive cleaning of exam room while observing appropriate contact time as indicated for disinfecting solutions.   I'm seeing this patient by the request  of:  Plotnikov, [name], MD  CC: Sacroiliac pain follow-up  [id number]  11/28/2020 Patient did respond and did have about an 80 to 90% improvement after the sacroiliac injection on the left side.  Patient has had MRIs that did not show anything significant but ultrasound did show the patient did have a small effusion in the area.  We discussed different treatment options at this time.  We discussed the possibility of PRP but this would be an experimental injection versus the possibility of referral to orthopedic surgery to discuss potential sacral fusion.  Patient did not respond well to epidural of the back and has not responded extremely well to different medications.  Patient will follow up with me again when she decides what type of treatment she would like to do total time with patient today as well as reviewing patient's previous notes as well as imaging including the MRI and the injections and ultrasound greater than 33 minutes.Marland Kitchen  Update 02/04/2021 [name] is a 65 y.o. female coming in with complaint of SI joint pain. Patient is here for PRP today. Patient states that she continues to have pain.     MRI of the lumbar spine showed a L2 nerve root impingement on the left side.  Patient had an MRI in December 2021 showing a very mild low-grade partial-thickness tear of the left hamstring tendon  Patient did not respond well to it left L2 nerve root block  Past  Medical History:  Diagnosis Date   ALLERGIC RHINITIS 03/02/2007   Anemia    ANXIETY 03/02/2007   Arthritis    ASTHMA 11/12/2007   Dr Orvil Feil   Cancer [facility])    skin, hx of   Dysrhythmia    pt. states at night will notice a different heart rhythem   Endometrial cancer ([place])    [place], HX OF 03/02/2007   GERD 03/02/2007   HYPOTHYROIDISM 11/12/2007   Dr Chalmers Cater   Neuromuscular disorder ([place])    carpel tunnel syndrome, compression of ulner nerve at elbows   OSTEOPENIA 11/12/2007   PEPTIC ULCER DISEASE 03/02/2007   Pneumonia    hx. of   Radiation 09/27/15-10/23/15   HDR to vaginal cuff 30 Gy   Stress fracture    TMJ SYNDROME 11/12/2007   Past Surgical History:  Procedure Laterality Date   2008 TMJ surgery      CARPAL TUNNEL WITH CUBITAL TUNNEL Left 07/2019   carpel tunnel Right 11/20/2016   colonoscopy x 2     cubital tunnel release Right 11/20/2016   deviated septum surgery - 2007     DILATION AND CURETTAGE OF UTERUS     2017   endoscopy - 1990     Mandib advancement forward  2009   ROBOTIC ASSISTED TOTAL HYSTERECTOMY WITH BILATERAL SALPINGO OOPHERECTOMY Bilateral 08/14/2015   Procedure: XI ROBOTIC ASSISTED TOTAL HYSTERECTOMY WITH BILATERAL SALPINGO OOPHORECTOMY AND SENTAL LYMPH NODE BIOPSY;  Surgeon: Everitt Amber, MD;  Location: [facility];  Service: Gynecology;  Laterality: Bilateral;  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     Social History   Socioeconomic History   Marital status: Married    Spouse name: Not on file   Number of children: Not on file   Years of education: Not on file   Highest education level: Not on file  Occupational History   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[employer]  Tobacco Use   Smoking status: Never   Smokeless tobacco: Never   Tobacco comments:    Married, 1 child  Vaping Use   Vaping Use: Never used  Substance and Sexual Activity   Alcohol use: Yes    Alcohol/week: 2.0 standard drinks    Types: 1 Glasses of wine, 1  Cans of beer per week    Comment: daily   Drug use: Caldwell   Sexual activity: Yes  Other Topics Concern   Not on file  Social History Narrative   Not on file   Social Determinants of Health   Financial Resource Strain: Not on file  Food Insecurity: Not on file  Transportation Needs: Not on file  Physical Activity: Not on file  Stress: Not on file  Social Connections: Not on file   Allergies  Allergen Reactions   Ibuprofen Other (See Comments)    Gastritis    Shellfish Allergy Other (See Comments)    Abdominal pain and Diarrhea   Colchicine     diarrhea   Astroglide [Gyne-Moistrin] Other (See Comments)    Itching and burning   Demerol Nausea And Vomiting   Penicillins Hives and Other (See Comments)    Face turned red and had a headache. Has patient had a PCN reaction causing immediate rash, facial/tongue/throat swelling, SOB or lightheadedness with hypotension: Caldwell Has patient had a PCN reaction causing severe rash involving mucus membranes or skin necrosis: Caldwell Has patient had a PCN reaction that required hospitalization: Caldwell Has patient had a PCN reaction occurring within the last 10 years: Caldwell If all of the above answers are "Caldwell", then may proceed with Cephalosporin use.   Family History  Problem Relation Age of Onset   Hypertension Father    Heart disease Father 2       chf   Heart disease Mother 41       CAD   Hypertension Other    Asthma Neg Hx     Current Outpatient Medications (Endocrine & Metabolic):    levothyroxine (SYNTHROID, LEVOTHROID) 25 MCG tablet, Take 25-50 mcg by mouth daily. She takes one tablet daily 4 days per week (Monday-Thursday) and two tablets daily 3 days per week (Friday-Sunday).   liothyronine (CYTOMEL) 5 MCG tablet, Take 5-10 mcg by mouth 2 (two) times daily. She takes one tablet in the morning and two tablets midday.   zoledronic acid (RECLAST) 5 MG/100ML SOLN injection, Inject 5 mg into the vein. Once a year  Current Outpatient  Medications (Cardiovascular):    EPIPEN 2-PAK 0.3 MG/0.3ML DEVI, Inject 0.3 mg into the muscle once. Reported on 09/03/2015  Current Outpatient Medications (Respiratory):    cetirizine (ZYRTEC) 5 MG tablet, Take 5 mg by mouth 2 (two) times daily as needed for allergies.   fluticasone (FLONASE) 50 MCG/ACT nasal spray, Place 1 spray into the nose daily as needed for allergies. Reported on 08/06/2015    Current Outpatient Medications (Other):    Calcium Citrate-Vitamin D (CALCIUM CITRATE +D PO), Take 2 tablets by mouth daily.    Diclofenac Sodium 2 % SOLN, Place 2 g onto the  skin 2 (two) times daily.   estradiol (ESTRACE) 0.1 MG/GM vaginal cream, PLACE 1 APPLICATORFUL VAGINALLY 3 (THREE) TIMES A WEEK   famotidine (PEPCID) 40 MG tablet, TAKE 1 TABLET BY MOUTH EVERY DAY   MELATONIN-CHAMOMILE PO, Take 1 capsule by mouth at bedtime as needed and may repeat dose one time if needed (For sleep.).    METRONIDAZOLE, TOPICAL, 0.75 % LOTN, Apply 1 application topically at bedtime.   minoxidil (ROGAINE) 2 % external solution, Apply 1 application topically 4 (four) times a week.    Multiple Vitamin (MULTIVITAMIN WITH MINERALS) TABS tablet, Take 1 tablet by mouth daily.   OVER THE COUNTER MEDICATION, Lion's Mane Extract 250 mg. 1 capsuleby mouth daily   OVER THE COUNTER MEDICATION, Oreganol 140 mg by mouth daily.   OVER THE COUNTER MEDICATION, Nature's Made Advanced Dual Action Digestive probiotic  Takes 1 tablet daily   Pancrelipase, Lip-Prot-Amyl, (ZENPEP) 40000-126000 units CPEP, Take 1 capsule three times a day Annual appt is due in must see provider for future refills   pyridoxine (B-6) 200 MG tablet, Take 200 mg by mouth daily.   venlafaxine XR (EFFEXOR-XR) 37.5 MG 24 hr capsule, TAKE 1 CAPSULE BY MOUTH EVERY DAY WITH BREAKFAST (Patient taking differently: Take 37.5 mg by mouth. 3 times a week)   Reviewed prior external information including notes and imaging from  primary care provider As well as notes  that were available from care everywhere and other healthcare systems.  Past medical history, social, surgical and family history all reviewed in electronic medical record.  Caldwell pertanent information unless stated regarding to the chief complaint.   Review of Systems:  Caldwell headache, visual changes, nausea, vomiting, diarrhea, constipation, dizziness, abdominal pain, skin rash, fevers, chills, night sweats, weight loss, swollen lymph nodes, body aches, joint swelling, chest pain, shortness of breath, mood changes. POSITIVE muscle aches  Objective  Blood pressure 120/72, pulse 65, height 5' 4.5" (1.638 m), weight 134 lb (60.8 kg), SpO2 99 %.   General: Caldwell apparent distress alert and oriented x3 mood and affect normal, dressed appropriately.  HEENT: Pupils equal, extraocular movements intact  Respiratory: Patient's speak in full sentences and does not appear short of breath   Procedure: Real-time Ultrasound Guided Injection of left sacroiliac joint Device: GE Logiq Q7 Ultrasound guided injection is preferred based studies that show increased duration, increased effect, greater accuracy, decreased procedural pain, increased response rate, and decreased cost with ultrasound guided versus blind injection.  Verbal informed consent obtained.  Time-out conducted.  Noted Caldwell overlying erythema, induration, or other signs of local infection.  Skin prepped in a sterile fashion.  Local anesthesia: Topical Ethyl chloride.  With sterile technique and under real time ultrasound guidance: With a 21-gauge 2 inch needle injected into the left sacroiliac joint with a total of 0.5 cc of 0.5% Marcaine and then had 4 cc of 3 centrifuge PRP injected into the area. Completed without difficulty  Pain immediately resolved suggesting accurate placement of the medication.  Advised to call if fevers/chills, erythema, induration, drainage, or persistent bleeding.  Impression: Technically successful ultrasound guided  injection.    Impression and Recommendations:     The above documentation has been reviewed and is accurate and complete Lyndal Pulley, DO

## 2021-02-04 NOTE — Patient Instructions (Signed)
No ice or IBU for 3 days See me again in 6 weeks

## 2021-02-04 NOTE — Assessment & Plan Note (Signed)
Patient given therapy today.  Tolerated the procedure well.  Given a post PRP progression.  Patient knows of potential side effects and what to look out for.  Patient will follow up with me again in 6 weeks

## 2021-02-12 ENCOUNTER — Encounter: Payer: Self-pay | Admitting: Family Medicine

## 2021-03-14 NOTE — Progress Notes (Signed)
[name] [place] Phone: [phone] Subjective:   [name], am serving as a scribe for Dr. Hulan Caldwell.  I'm seeing this patient by the request  of:  [name], Maria Lacks, MD  CC: Low back pain foot pain follow-up  [id number]  02/04/2021 Patient given therapy today.  Tolerated the procedure well.  Given a post PRP progression.  Patient knows of potential side effects and what to look out for.  Patient will follow up with me again in 6 weeks  Update 03/19/2021 [name] is a 65 y.o. female coming in with complaint of SI joint pain. Patient states cautiously optimistic has been a tiny bit better in the last five days not in agony when she wakes up in the AM still unstable and intermittent pain and twinges throughout the day. L foot pain, at the metatarsals, is not better, despite taking the supplements you recommended and using the carbon fiber insert in her shoe. Patient would like to try other things for her back if possible.  Would like to increase activity.      Past Medical History:  Diagnosis Date   ALLERGIC RHINITIS 03/02/2007   Anemia    ANXIETY 03/02/2007   Arthritis    ASTHMA 11/12/2007   Dr [name]   Cancer [facility])    skin, hx of   Dysrhythmia    pt. states at night will notice a different heart rhythem   Endometrial cancer ([place])    [place], HX OF 03/02/2007   GERD 03/02/2007   HYPOTHYROIDISM 11/12/2007   Dr [name]   Neuromuscular disorder ([place])    carpel tunnel syndrome, compression of ulner nerve at elbows   OSTEOPENIA 11/12/2007   PEPTIC ULCER DISEASE 03/02/2007   Pneumonia    hx. of   Radiation 09/27/15-10/23/15   HDR to vaginal cuff 30 Gy   Stress fracture    TMJ SYNDROME 11/12/2007   Past Surgical History:  Procedure Laterality Date   2008 TMJ surgery      CARPAL TUNNEL WITH CUBITAL TUNNEL Left 07/2019   carpel tunnel Right 11/20/2016   colonoscopy x 2     cubital  tunnel release Right 11/20/2016   deviated septum surgery - 2007     DILATION AND CURETTAGE OF UTERUS     2017   endoscopy - 1990     Mandib advancement forward  2009   ROBOTIC ASSISTED TOTAL HYSTERECTOMY WITH BILATERAL SALPINGO OOPHERECTOMY Bilateral 08/14/2015   Procedure: XI ROBOTIC ASSISTED TOTAL HYSTERECTOMY WITH BILATERAL SALPINGO OOPHORECTOMY AND SENTAL LYMPH NODE BIOPSY;  Surgeon: Maria Amber, MD;  Location: [facility];  Service: Gynecology;  Laterality: Bilateral;   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     Social History   Socioeconomic History   Marital status: Married    Spouse name: Not on file   Number of children: Not on file   Years of education: Not on file   Highest education level: Not on file  Occupational History   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[employer]  Tobacco Use   Smoking status: Never   Smokeless tobacco: Never   Tobacco comments:    Married, 1 child  Vaping Use   Vaping Use: Never used  Substance and Sexual Activity   Alcohol use: Yes    Alcohol/week: 2.0 standard drinks    Types: 1 Glasses of wine, 1 Cans of beer per week    Comment: daily  Drug use: No   Sexual activity: Yes  Other Topics Concern   Not on file  Social History Narrative   Not on file   Social Determinants of Health   Financial Resource Strain: Not on file  Food Insecurity: Not on file  Transportation Needs: Not on file  Physical Activity: Not on file  Stress: Not on file  Social Connections: Not on file   Allergies  Allergen Reactions   Ibuprofen Other (See Comments)    Gastritis    Shellfish Allergy Other (See Comments)    Abdominal pain and Diarrhea   Colchicine     diarrhea   Astroglide [Gyne-Moistrin] Other (See Comments)    Itching and burning   Demerol Nausea And Vomiting   Penicillins Hives and Other (See Comments)    Face turned red and had a headache. Has patient had a PCN reaction causing immediate rash, facial/tongue/throat swelling,  SOB or lightheadedness with hypotension: no Has patient had a PCN reaction causing severe rash involving mucus membranes or skin necrosis: no Has patient had a PCN reaction that required hospitalization: no Has patient had a PCN reaction occurring within the last 10 years: no If all of the above answers are "NO", then may proceed with Cephalosporin use.   Family History  Problem Relation Age of Onset   Hypertension Father    Heart disease Father 29       chf   Heart disease Mother 46       CAD   Hypertension Other    Asthma Neg Hx     Current Outpatient Medications (Endocrine & Metabolic):    levothyroxine (SYNTHROID, LEVOTHROID) 25 MCG tablet, Take 25-50 mcg by mouth daily. She takes one tablet daily 4 days per week (Monday-Thursday) and two tablets daily 3 days per week (Friday-Sunday).   liothyronine (CYTOMEL) 5 MCG tablet, Take 5-10 mcg by mouth 2 (two) times daily. She takes one tablet in the morning and two tablets midday.   zoledronic acid (RECLAST) 5 MG/100ML SOLN injection, Inject 5 mg into the vein. Once a year  Current Outpatient Medications (Cardiovascular):    EPIPEN 2-PAK 0.3 MG/0.3ML DEVI, Inject 0.3 mg into the muscle once. Reported on 09/03/2015  Current Outpatient Medications (Respiratory):    cetirizine (ZYRTEC) 5 MG tablet, Take 5 mg by mouth 2 (two) times daily as needed for allergies.   fluticasone (FLONASE) 50 MCG/ACT nasal spray, Place 1 spray into the nose daily as needed for allergies. Reported on 08/06/2015    Current Outpatient Medications (Other):    Calcium Citrate-Vitamin D (CALCIUM CITRATE +D PO), Take 2 tablets by mouth daily.    Diclofenac Sodium 2 % SOLN, Place 2 g onto the skin 2 (two) times daily.   estradiol (ESTRACE) 0.1 MG/GM vaginal cream, PLACE 1 APPLICATORFUL VAGINALLY 3 (THREE) TIMES A WEEK   famotidine (PEPCID) 40 MG tablet, TAKE 1 TABLET BY MOUTH EVERY DAY   MELATONIN-CHAMOMILE PO, Take 1 capsule by mouth at bedtime as needed and may  repeat dose one time if needed (For sleep.).    METRONIDAZOLE, TOPICAL, 0.75 % LOTN, Apply 1 application topically at bedtime.   minoxidil (ROGAINE) 2 % external solution, Apply 1 application topically 4 (four) times a week.    Multiple Vitamin (MULTIVITAMIN WITH MINERALS) TABS tablet, Take 1 tablet by mouth daily.   OVER THE COUNTER MEDICATION, Lion's Mane Extract 250 mg. 1 capsuleby mouth daily   OVER THE COUNTER MEDICATION, Oreganol 140 mg by mouth daily.   OVER  THE COUNTER MEDICATION, Nature's Made Advanced Dual Action Digestive probiotic  Takes 1 tablet daily   Pancrelipase, Lip-Prot-Amyl, (ZENPEP) 40000-126000 units CPEP, Take 1 capsule three times a day Annual appt is due in must see provider for future refills   pyridoxine (B-6) 200 MG tablet, Take 200 mg by mouth daily.   venlafaxine XR (EFFEXOR-XR) 37.5 MG 24 hr capsule, TAKE 1 CAPSULE BY MOUTH EVERY DAY WITH BREAKFAST (Patient taking differently: Take 37.5 mg by mouth. 3 times a week)   Reviewed prior external information including notes and imaging from  primary care provider As well as notes that were available from care everywhere and other healthcare systems.  Past medical history, social, surgical and family history all reviewed in electronic medical record.  No pertanent information unless stated regarding to the chief complaint.   Review of Systems:  No headache, visual changes, nausea, vomiting, diarrhea, constipation, dizziness, abdominal pain, skin rash, fevers, chills, night sweats, weight loss, swollen lymph nodes, body aches, joint swelling, chest pain, shortness of breath, mood changes. POSITIVE muscle aches  Objective  Blood pressure 110/76, pulse 67, height 5' 4.5" (1.638 m), weight 139 lb (63 kg), SpO2 97 %.   General: No apparent distress alert and oriented x3 mood and affect normal, dressed appropriately.  HEENT: Pupils equal, extraocular movements intact  Respiratory: Patient's speak in full sentences and does  not appear short of breath  Cardiovascular: No lower extremity edema, non tender, no erythema  Gait normal with good balance and coordination.  MSK: Low back exam shows the patient still has some tenderness around the 6 iliac joint.  With patient sitting does seem to favor the right side. Foot exam shows left foot does have a positive squeeze test noted.  Tenderness between the second and third metatarsals.  Limited muscular skeletal ultrasound was performed and interpreted by [name], M  Limited ultrasound of patient's foot shows that patient does have some dilation of the nerve between the second and third metatarsal heads but no significant subluxation of the nerve at the moment.  No cortical irregularity of any of the metatarsals. 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

## 2021-03-18 ENCOUNTER — Encounter: Payer: Self-pay | Admitting: Family Medicine

## 2021-03-18 DIAGNOSIS — J4520 Mild intermittent asthma, uncomplicated: Secondary | ICD-10-CM | POA: Diagnosis not present

## 2021-03-18 DIAGNOSIS — J3089 Other allergic rhinitis: Secondary | ICD-10-CM | POA: Diagnosis not present

## 2021-03-18 DIAGNOSIS — J3081 Allergic rhinitis due to animal (cat) (dog) hair and dander: Secondary | ICD-10-CM | POA: Diagnosis not present

## 2021-03-18 DIAGNOSIS — J301 Allergic rhinitis due to pollen: Secondary | ICD-10-CM | POA: Diagnosis not present

## 2021-03-19 ENCOUNTER — Encounter: Payer: Self-pay | Admitting: Family Medicine

## 2021-03-19 ENCOUNTER — Ambulatory Visit (INDEPENDENT_AMBULATORY_CARE_PROVIDER_SITE_OTHER): Payer: BC Managed Care – PPO | Admitting: Family Medicine

## 2021-03-19 ENCOUNTER — Ambulatory Visit: Payer: Self-pay

## 2021-03-19 ENCOUNTER — Other Ambulatory Visit: Payer: Self-pay

## 2021-03-19 VITALS — BP 110/76 | HR 67 | Ht 64.5 in | Wt 139.0 lb

## 2021-03-19 DIAGNOSIS — M5442 Lumbago with sciatica, left side: Secondary | ICD-10-CM | POA: Diagnosis not present

## 2021-03-19 DIAGNOSIS — M84375D Stress fracture, left foot, subsequent encounter for fracture with routine healing: Secondary | ICD-10-CM | POA: Diagnosis not present

## 2021-03-19 DIAGNOSIS — M533 Sacrococcygeal disorders, not elsewhere classified: Secondary | ICD-10-CM | POA: Diagnosis not present

## 2021-03-19 DIAGNOSIS — G5762 Lesion of plantar nerve, left lower limb: Secondary | ICD-10-CM

## 2021-03-19 DIAGNOSIS — M79672 Pain in left foot: Secondary | ICD-10-CM

## 2021-03-19 DIAGNOSIS — G8929 Other chronic pain: Secondary | ICD-10-CM

## 2021-03-19 NOTE — Patient Instructions (Addendum)
Good to see you  Metatarsal pad given  Keep doing the exercises PT referral O'Halloran to work on SI joint See me again in 5 weeks

## 2021-03-19 NOTE — Assessment & Plan Note (Signed)
Patient does have metatarsalgia as well as patient does have loss of transverse arch.  On ultrasound today did not show any significant cortical irregularity but I am concerned that patient is getting a reactive neuroma.  Discussed icing regimen and home exercises, which activities to do which wants to avoid.  Increase activity slowly.  Follow-up with me again in 6 to 8 weeks discussed normal recovery sandals as well.  Worsening pain consider injection with patient traveling to [place]

## 2021-03-19 NOTE — Assessment & Plan Note (Signed)
Patient has responded fairly well to the PRP injection.  Still has instability noted at this time.  We discussed potentially repeating the PRP but instead decided that we will actually to start with formal physical therapy.  Hopefully this will be beneficial.  Patient will follow up with me again in 6 to 8 weeks to further evaluate.  We have done other imaging studies as well as injections and patient has made improvement overall but continues to have some chronic pain and instability of the sacroiliac joint.  Patient is to avoid anything such as fusion.  Once again follow-up in 6 to 8 weeks

## 2021-04-08 ENCOUNTER — Ambulatory Visit: Payer: BC Managed Care – PPO | Attending: Internal Medicine

## 2021-04-08 ENCOUNTER — Other Ambulatory Visit (HOSPITAL_BASED_OUTPATIENT_CLINIC_OR_DEPARTMENT_OTHER): Payer: Self-pay

## 2021-04-08 DIAGNOSIS — Z23 Encounter for immunization: Secondary | ICD-10-CM

## 2021-04-08 MED ORDER — PFIZER COVID-19 VAC BIVALENT 30 MCG/0.3ML IM SUSP
INTRAMUSCULAR | 0 refills | Status: DC
  Filled 2021-04-08: qty 0.3, 1d supply, fill #0

## 2021-04-08 NOTE — Progress Notes (Addendum)
   Covid-19 Vaccination Clinic  Name:  [name]    MRN: [id number] DOB: [birth year]  04/08/2021  [name] was observed post Covid-19 immunization for 30 minutes based on pre-vaccination screening without incident. She was provided with Vaccine Information Sheet and instruction to access the V-Safe system.   [name] was instructed to call 911 with any severe reactions post vaccine: Difficulty breathing  Swelling of face and throat  A fast heartbeat  A bad rash all over body  Dizziness and weakness

## 2021-04-22 ENCOUNTER — Encounter: Payer: Self-pay | Admitting: Family Medicine

## 2021-04-23 ENCOUNTER — Ambulatory Visit: Payer: BC Managed Care – PPO | Admitting: Family Medicine

## 2021-04-23 NOTE — Progress Notes (Signed)
[facility] [address] [place] Phone: [phone] Subjective:   IVilma Meckel, am serving as a scribe for Dr. Hulan Saas. This visit occurred during the SARS-CoV-2 public health emergency.  Safety protocols were in place, including screening questions prior to the visit, additional usage of staff PPE, and extensive cleaning of exam room while observing appropriate contact time as indicated for disinfecting solutions.   I'm seeing this patient by the request  of:  Plotnikov, [name], MD  CC: Foot pain and hip pain follow-up  [id number]  03/19/2021 Patient does have metatarsalgia as well as patient does have loss of transverse arch.  On ultrasound today did not show any significant cortical irregularity but I am concerned that patient is getting a reactive neuroma.  Discussed icing regimen and home exercises, which activities to do which wants to avoid.  Increase activity slowly.  Follow-up with me again in 6 to 8 weeks discussed normal recovery sandals as well.  Worsening pain consider injection with patient traveling to [place]  Updated 04/24/2021 KASEE HANTZ is a 65 y.o. female coming in with complaint of left foot pain.  Found to have breakdown of the transverse arch and likely a neuroma noted on ultrasound.  Patient states doing the same. She said the pennsaid maybe helping, not really sure. Started PT for the SI and is hopeful. Going to [place] Friday and wants relief for trip  Patient is also having difficulty with her sacroiliac joint.  We have attempting PRP injection previously.  Patient does not know if it made any significant improvement.  Also patient has responded somewhat to the left groin and the left cubital pain from an epidural patient had in January.      Past Medical History:  Diagnosis Date   ALLERGIC RHINITIS 03/02/2007   Anemia    ANXIETY 03/02/2007   Arthritis    ASTHMA 11/12/2007   Dr Orvil Feil   Cancer  [facility])    skin, hx of   Dysrhythmia    pt. states at night will notice a different heart rhythem   Endometrial cancer ([place])    [place], HX OF 03/02/2007   GERD 03/02/2007   HYPOTHYROIDISM 11/12/2007   Dr Chalmers Cater   Neuromuscular disorder ([place])    carpel tunnel syndrome, compression of ulner nerve at elbows   OSTEOPENIA 11/12/2007   PEPTIC ULCER DISEASE 03/02/2007   Pneumonia    hx. of   Radiation 09/27/15-10/23/15   HDR to vaginal cuff 30 Gy   Stress fracture    TMJ SYNDROME 11/12/2007   Past Surgical History:  Procedure Laterality Date   2008 TMJ surgery      CARPAL TUNNEL WITH CUBITAL TUNNEL Left 07/2019   carpel tunnel Right 11/20/2016   colonoscopy x 2     cubital tunnel release Right 11/20/2016   deviated septum surgery - 2007     DILATION AND CURETTAGE OF UTERUS     2017   endoscopy - 1990     Mandib advancement forward  2009   ROBOTIC ASSISTED TOTAL HYSTERECTOMY WITH BILATERAL SALPINGO OOPHERECTOMY Bilateral 08/14/2015   Procedure: XI ROBOTIC ASSISTED TOTAL HYSTERECTOMY WITH BILATERAL SALPINGO OOPHORECTOMY AND SENTAL LYMPH NODE BIOPSY;  Surgeon: Everitt Amber, MD;  Location: [facility];  Service: Gynecology;  Laterality: Bilateral;   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     Social History   Socioeconomic History   Marital status: Married    Spouse  name: Not on file   Number of children: Not on file   Years of education: Not on file   Highest education level: Not on file  Occupational History   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[employer]  Tobacco Use   Smoking status: Never   Smokeless tobacco: Never   Tobacco comments:    Married, 1 child  Vaping Use   Vaping Use: Never used  Substance and Sexual Activity   Alcohol use: Yes    Alcohol/week: 2.0 standard drinks    Types: 1 Glasses of wine, 1 Cans of beer per week    Comment: daily   Drug use: No   Sexual activity: Yes  Other Topics Concern   Not on file  Social History Narrative   Not  on file   Social Determinants of Health   Financial Resource Strain: Not on file  Food Insecurity: Not on file  Transportation Needs: Not on file  Physical Activity: Not on file  Stress: Not on file  Social Connections: Not on file   Allergies  Allergen Reactions   Ibuprofen Other (See Comments)    Gastritis    Shellfish Allergy Other (See Comments)    Abdominal pain and Diarrhea   Colchicine     diarrhea   Astroglide [Gyne-Moistrin] Other (See Comments)    Itching and burning   Demerol Nausea And Vomiting   Penicillins Hives and Other (See Comments)    Face turned red and had a headache. Has patient had a PCN reaction causing immediate rash, facial/tongue/throat swelling, SOB or lightheadedness with hypotension: no Has patient had a PCN reaction causing severe rash involving mucus membranes or skin necrosis: no Has patient had a PCN reaction that required hospitalization: no Has patient had a PCN reaction occurring within the last 10 years: no If all of the above answers are "NO", then may proceed with Cephalosporin use.   Family History  Problem Relation Age of Onset   Hypertension Father    Heart disease Father 12       chf   Heart disease Mother 44       CAD   Hypertension Other    Asthma Neg Hx     Current Outpatient Medications (Endocrine & Metabolic):    predniSONE (DELTASONE) 20 MG tablet, Take 1 tablet (20 mg total) by mouth daily with breakfast.   levothyroxine (SYNTHROID, LEVOTHROID) 25 MCG tablet, Take 25-50 mcg by mouth daily. She takes one tablet daily 4 days per week (Monday-Thursday) and two tablets daily 3 days per week (Friday-Sunday).   liothyronine (CYTOMEL) 5 MCG tablet, Take 5-10 mcg by mouth 2 (two) times daily. She takes one tablet in the morning and two tablets midday.   zoledronic acid (RECLAST) 5 MG/100ML SOLN injection, Inject 5 mg into the vein. Once a year  Current Outpatient Medications (Cardiovascular):    EPIPEN 2-PAK 0.3 MG/0.3ML  DEVI, Inject 0.3 mg into the muscle once. Reported on 09/03/2015  Current Outpatient Medications (Respiratory):    cetirizine (ZYRTEC) 5 MG tablet, Take 5 mg by mouth 2 (two) times daily as needed for allergies.   fluticasone (FLONASE) 50 MCG/ACT nasal spray, Place 1 spray into the nose daily as needed for allergies. Reported on 08/06/2015    Current Outpatient Medications (Other):    doxycycline (VIBRA-TABS) 100 MG tablet, Take 1 tablet (100 mg total) by mouth 2 (two) times daily for 7 days.   Calcium Citrate-Vitamin D (CALCIUM CITRATE +D PO), Take 2 tablets by mouth daily.  COVID-19 mRNA bivalent vaccine, Pfizer, (PFIZER COVID-19 VAC BIVALENT) injection, Inject into the muscle.   Diclofenac Sodium 2 % SOLN, Place 2 g onto the skin 2 (two) times daily.   estradiol (ESTRACE) 0.1 MG/GM vaginal cream, PLACE 1 APPLICATORFUL VAGINALLY 3 (THREE) TIMES A WEEK   famotidine (PEPCID) 40 MG tablet, TAKE 1 TABLET BY MOUTH EVERY DAY   MELATONIN-CHAMOMILE PO, Take 1 capsule by mouth at bedtime as needed and may repeat dose one time if needed (For sleep.).    METRONIDAZOLE, TOPICAL, 0.75 % LOTN, Apply 1 application topically at bedtime.   minoxidil (ROGAINE) 2 % external solution, Apply 1 application topically 4 (four) times a week.    Multiple Vitamin (MULTIVITAMIN WITH MINERALS) TABS tablet, Take 1 tablet by mouth daily.   OVER THE COUNTER MEDICATION, Lion's Mane Extract 250 mg. 1 capsuleby mouth daily   OVER THE COUNTER MEDICATION, Oreganol 140 mg by mouth daily.   OVER THE COUNTER MEDICATION, Nature's Made Advanced Dual Action Digestive probiotic  Takes 1 tablet daily   Pancrelipase, Lip-Prot-Amyl, (ZENPEP) 40000-126000 units CPEP, Take 1 capsule three times a day Annual appt is due in must see provider for future refills   pyridoxine (B-6) 200 MG tablet, Take 200 mg by mouth daily.   venlafaxine XR (EFFEXOR-XR) 37.5 MG 24 hr capsule, TAKE 1 CAPSULE BY MOUTH EVERY DAY WITH BREAKFAST (Patient taking  differently: Take 37.5 mg by mouth. 3 times a week)   Reviewed prior external information including notes and imaging from  primary care provider As well as notes that were available from care everywhere and other healthcare systems.  Past medical history, social, surgical and family history all reviewed in electronic medical record.  No pertanent information unless stated regarding to the chief complaint.   Review of Systems:  No headache, visual changes, nausea, vomiting, diarrhea, constipation, dizziness, abdominal pain, skin rash, fevers, chills, night sweats, weight loss, swollen lymph nodes,  joint swelling, chest pain, shortness of breath, mood changes. POSITIVE muscle aches, body aches  Objective  Blood pressure 114/74, pulse 62, height 5\' 4"  (1.626 m), weight 136 lb (61.7 kg), SpO2 93 %.   General: No apparent distress alert and oriented x3 mood and affect normal, dressed appropriately.  HEENT: Pupils equal, extraocular movements intact  Respiratory: Patient's speak in full sentences and does not appear short of breath  Cardiovascular: No lower extremity edema, non tender, no erythema  Gait normal with good balance and coordination.  MSK:   Patient's left foot does have breakdown of the transverse arch noted.  Positive squeeze test noted.  More pain in the second and third and third and fourth metatarsal spaces.   Procedure: Real-time Ultrasound Guided Injection of left third fourth neuroma Device: GE Logiq Q7 Ultrasound guided injection is preferred based studies that show increased duration, increased effect, greater accuracy, decreased procedural pain, increased response rate, and decreased cost with ultrasound guided versus blind injection.  Verbal informed consent obtained.  Time-out conducted.  Noted no overlying erythema, induration, or other signs of local infection.  Skin prepped in a sterile fashion.  Local anesthesia: Topical Ethyl chloride.  With sterile technique  and under real time ultrasound guidance: With a 25-gauge half inch needle injecting 0.5 cc of 0.5% Marcaine and 0.5 cc of Kenalog 40 mg/mL Completed without difficulty  Pain immediately resolved suggesting accurate placement of the medication.  Advised to call if fevers/chills, erythema, induration, drainage, or persistent bleeding.  Impression: Technically successful ultrasound guided injection.  Procedure: Real-time  Ultrasound Guided Injection of left fourth and fifth neuroma Device: GE Logiq Q7 Ultrasound guided injection is preferred based studies that show increased duration, increased effect, greater accuracy, decreased procedural pain, increased response rate, and decreased cost with ultrasound guided versus blind injection.  Verbal informed consent obtained.  Time-out conducted.  Noted no overlying erythema, induration, or other signs of local infection.  Skin prepped in a sterile fashion.  Local anesthesia: Topical Ethyl chloride.  With sterile technique and under real time ultrasound guidance: With a 25-gauge half inch needle injecting 0.5 cc of 0.5% Marcaine and 0.5 cc of Kenalog 40 mg/mL Completed without difficulty  Pain immediately resolved suggesting accurate placement of the medication.  Advised to call if fevers/chills, erythema, induration, drainage, or persistent bleeding.  Impression: Technically successful ultrasound guided injection.   Impression and Recommendations:     The above documentation has been reviewed and is accurate and complete Lyndal Pulley, DO

## 2021-04-24 ENCOUNTER — Ambulatory Visit (INDEPENDENT_AMBULATORY_CARE_PROVIDER_SITE_OTHER): Payer: BC Managed Care – PPO | Admitting: Family Medicine

## 2021-04-24 ENCOUNTER — Other Ambulatory Visit: Payer: Self-pay

## 2021-04-24 ENCOUNTER — Ambulatory Visit: Payer: Self-pay

## 2021-04-24 ENCOUNTER — Encounter: Payer: Self-pay | Admitting: Family Medicine

## 2021-04-24 VITALS — BP 114/74 | HR 62 | Ht 64.0 in | Wt 136.0 lb

## 2021-04-24 DIAGNOSIS — M7742 Metatarsalgia, left foot: Secondary | ICD-10-CM | POA: Diagnosis not present

## 2021-04-24 DIAGNOSIS — G5782 Other specified mononeuropathies of left lower limb: Secondary | ICD-10-CM

## 2021-04-24 MED ORDER — PREDNISONE 20 MG PO TABS: 20.0000 mg | ORAL_TABLET | Freq: Every day | ORAL | 0 refills | Status: DC

## 2021-04-24 MED ORDER — DOXYCYCLINE HYCLATE 100 MG PO TABS: 100.0000 mg | ORAL_TABLET | Freq: Two times a day (BID) | ORAL | 0 refills | Status: AC

## 2021-04-24 NOTE — Patient Instructions (Signed)
Good to see you  Injected 2 nueromas today  Ice 20 minutes 2 times daily. Usually after activity and before bed. Sent in prednisone as well incase you need it Also send in doxy in case any redness or swelling of foot occurs See me again in 6 weeks

## 2021-04-24 NOTE — Assessment & Plan Note (Signed)
Patient given injection today and tolerated procedure well.  Patient did have numbness of the stated that the pain was significantly improved.  Patient will be traveling and will also give patient prednisone in case she needs it.  Discussed icing regimen and home exercises.  Discussed proper shoes.  May need to consider the possibility of custom braces or orthotics in the long run.  Follow-up again in 6 to 8 weeks.

## 2021-05-24 ENCOUNTER — Encounter: Payer: Self-pay | Admitting: Family Medicine

## 2021-06-05 NOTE — Progress Notes (Signed)
Zach Joven Mom Word[facility] [address] [place] Phone: [phone] Subjective:   IVilma Meckel, am serving as a scribe for Dr. Hulan Saas. This visit occurred during the SARS-CoV-2 public health emergency.  Safety protocols were in place, including screening questions prior to the visit, additional usage of staff PPE, and extensive cleaning of exam room while observing appropriate contact time as indicated for disinfecting solutions.   I'm seeing this patient by the request  of:  Plotnikov, [name], MD  CC: Low back pain  [id number]  04/24/2021 Patient given injection today and tolerated procedure well.  Patient did have numbness of the stated that the pain was significantly improved.  Patient will be traveling and will also give patient prednisone in case she needs it.  Discussed icing regimen and home exercises.  Discussed proper shoes.  May need to consider the possibility of custom braces or orthotics in the long run.  Follow-up again in 6 to 8 weeks.  Updated 06/06/2021 [name] is a 65 y.o. female coming in with complaint of left foot pain Pain has decreased immensely. Rarely feeling pain. Hip and back pain has decreased as well. No other complaints.  Patient states that it is more in the gluteal area.  Does not feel like she can actually strengthen it.       Past Medical History:  Diagnosis Date   ALLERGIC RHINITIS 03/02/2007   Anemia    ANXIETY 03/02/2007   Arthritis    ASTHMA 11/12/2007   Dr Orvil Feil   Cancer [facility])    skin, hx of   Dysrhythmia    pt. states at night will notice a different heart rhythem   Endometrial cancer ([place])    [place], HX OF 03/02/2007   GERD 03/02/2007   HYPOTHYROIDISM 11/12/2007   Dr Chalmers Cater   Neuromuscular disorder ([place])    carpel tunnel syndrome, compression of ulner nerve at elbows   OSTEOPENIA 11/12/2007   PEPTIC ULCER DISEASE 03/02/2007   Pneumonia    hx. of   Radiation  09/27/15-10/23/15   HDR to vaginal cuff 30 Gy   Stress fracture    TMJ SYNDROME 11/12/2007   Past Surgical History:  Procedure Laterality Date   2008 TMJ surgery      CARPAL TUNNEL WITH CUBITAL TUNNEL Left 07/2019   carpel tunnel Right 11/20/2016   colonoscopy x 2     cubital tunnel release Right 11/20/2016   deviated septum surgery - 2007     DILATION AND CURETTAGE OF UTERUS     2017   endoscopy - 1990     Mandib advancement forward  2009   ROBOTIC ASSISTED TOTAL HYSTERECTOMY WITH BILATERAL SALPINGO OOPHERECTOMY Bilateral 08/14/2015   Procedure: XI ROBOTIC ASSISTED TOTAL HYSTERECTOMY WITH BILATERAL SALPINGO OOPHORECTOMY AND SENTAL LYMPH NODE BIOPSY;  Surgeon: Everitt Amber, MD;  Location: [facility];  Service: Gynecology;  Laterality: Bilateral;   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     Social History   Socioeconomic History   Marital status: Married    Spouse name: Not on file   Number of children: Not on file   Years of education: Not on file   Highest education level: Not on file  Occupational History   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[employer]  Tobacco Use   Smoking status: Never   Smokeless tobacco: Never   Tobacco comments:    Married, 1 child  Vaping Use   Vaping Use: Never  used  Substance and Sexual Activity   Alcohol use: Yes    Alcohol/week: 2.0 standard drinks    Types: 1 Glasses of wine, 1 Cans of beer per week    Comment: daily   Drug use: No   Sexual activity: Yes  Other Topics Concern   Not on file  Social History Narrative   Not on file   Social Determinants of Health   Financial Resource Strain: Not on file  Food Insecurity: Not on file  Transportation Needs: Not on file  Physical Activity: Not on file  Stress: Not on file  Social Connections: Not on file   Allergies  Allergen Reactions   Ibuprofen Other (See Comments)    Gastritis    Shellfish Allergy Other (See Comments)    Abdominal pain and Diarrhea   Colchicine      diarrhea   Astroglide [Gyne-Moistrin] Other (See Comments)    Itching and burning   Demerol Nausea And Vomiting   Penicillins Hives and Other (See Comments)    Face turned red and had a headache. Has patient had a PCN reaction causing immediate rash, facial/tongue/throat swelling, SOB or lightheadedness with hypotension: no Has patient had a PCN reaction causing severe rash involving mucus membranes or skin necrosis: no Has patient had a PCN reaction that required hospitalization: no Has patient had a PCN reaction occurring within the last 10 years: no If all of the above answers are "NO", then may proceed with Cephalosporin use.   Family History  Problem Relation Age of Onset   Hypertension Father    Heart disease Father 55       chf   Heart disease Mother 72       CAD   Hypertension Other    Asthma Neg Hx     Current Outpatient Medications (Endocrine & Metabolic):    levothyroxine (SYNTHROID, LEVOTHROID) 25 MCG tablet, Take 25-50 mcg by mouth daily. She takes one tablet daily 4 days per week (Monday-Thursday) and two tablets daily 3 days per week (Friday-Sunday).   liothyronine (CYTOMEL) 5 MCG tablet, Take 5-10 mcg by mouth 2 (two) times daily. She takes one tablet in the morning and two tablets midday.   predniSONE (DELTASONE) 20 MG tablet, Take 1 tablet (20 mg total) by mouth daily with breakfast.   zoledronic acid (RECLAST) 5 MG/100ML SOLN injection, Inject 5 mg into the vein. Once a year  Current Outpatient Medications (Cardiovascular):    EPIPEN 2-PAK 0.3 MG/0.3ML DEVI, Inject 0.3 mg into the muscle once. Reported on 09/03/2015  Current Outpatient Medications (Respiratory):    cetirizine (ZYRTEC) 5 MG tablet, Take 5 mg by mouth 2 (two) times daily as needed for allergies.   fluticasone (FLONASE) 50 MCG/ACT nasal spray, Place 1 spray into the nose daily as needed for allergies. Reported on 08/06/2015    Current Outpatient Medications (Other):    Calcium Citrate-Vitamin D  (CALCIUM CITRATE +D PO), Take 2 tablets by mouth daily.    COVID-19 mRNA bivalent vaccine, Pfizer, (PFIZER COVID-19 VAC BIVALENT) injection, Inject into the muscle.   Diclofenac Sodium 2 % SOLN, Place 2 g onto the skin 2 (two) times daily.   estradiol (ESTRACE) 0.1 MG/GM vaginal cream, PLACE 1 APPLICATORFUL VAGINALLY 3 (THREE) TIMES A WEEK   famotidine (PEPCID) 40 MG tablet, TAKE 1 TABLET BY MOUTH EVERY DAY   MELATONIN-CHAMOMILE PO, Take 1 capsule by mouth at bedtime as needed and may repeat dose one time if needed (For sleep.).    METRONIDAZOLE,  TOPICAL, 0.75 % LOTN, Apply 1 application topically at bedtime.   minoxidil (ROGAINE) 2 % external solution, Apply 1 application topically 4 (four) times a week.    Multiple Vitamin (MULTIVITAMIN WITH MINERALS) TABS tablet, Take 1 tablet by mouth daily.   OVER THE COUNTER MEDICATION, Lion's Mane Extract 250 mg. 1 capsuleby mouth daily   OVER THE COUNTER MEDICATION, Oreganol 140 mg by mouth daily.   OVER THE COUNTER MEDICATION, Nature's Made Advanced Dual Action Digestive probiotic  Takes 1 tablet daily   Pancrelipase, Lip-Prot-Amyl, (ZENPEP) 40000-126000 units CPEP, Take 1 capsule three times a day Annual appt is due in must see provider for future refills   pyridoxine (B-6) 200 MG tablet, Take 200 mg by mouth daily.   venlafaxine XR (EFFEXOR-XR) 37.5 MG 24 hr capsule, TAKE 1 CAPSULE BY MOUTH EVERY DAY WITH BREAKFAST (Patient taking differently: Take 37.5 mg by mouth. 3 times a week)   Reviewed prior external information including notes and imaging from  primary care provider As well as notes that were available from care everywhere and other healthcare systems.  Past medical history, social, surgical and family history all reviewed in electronic medical record.  No pertanent information unless stated regarding to the chief complaint.   Review of Systems:  No headache, visual changes, nausea, vomiting, diarrhea, constipation, dizziness, abdominal  pain, skin rash, fevers, chills, night sweats, weight loss, swollen lymph nodes, body aches, joint swelling, chest pain, shortness of breath, mood changes. POSITIVE muscle aches  Objective  Blood pressure 140/80, pulse 71, height 5\' 4"  (1.626 m), weight 137 lb (62.1 kg), SpO2 96 %.   General: No apparent distress alert and oriented x3 mood and affect normal, dressed appropriately.  HEENT: Pupils equal, extraocular movements intact  Respiratory: Patient's speak in full sentences and does not appear short of breath  Cardiovascular: No lower extremity edema, non tender, no erythema  Gait normal with good balance and coordination.  MSK: Low back exam does have some mild loss of lordosis.  Tender to palpation in the L3, L4 and L5 left side.  Tightness noted with FABER test.    Impression and Recommendations:     The above documentation has been reviewed and is accurate and complete Lyndal Pulley, DO

## 2021-06-06 ENCOUNTER — Other Ambulatory Visit: Payer: Self-pay

## 2021-06-06 ENCOUNTER — Ambulatory Visit (INDEPENDENT_AMBULATORY_CARE_PROVIDER_SITE_OTHER): Payer: BC Managed Care – PPO | Admitting: Family Medicine

## 2021-06-06 ENCOUNTER — Ambulatory Visit: Payer: Self-pay

## 2021-06-06 VITALS — BP 140/80 | HR 71 | Ht 64.0 in | Wt 137.0 lb

## 2021-06-06 DIAGNOSIS — M5442 Lumbago with sciatica, left side: Secondary | ICD-10-CM

## 2021-06-06 DIAGNOSIS — G8929 Other chronic pain: Secondary | ICD-10-CM

## 2021-06-06 DIAGNOSIS — G5762 Lesion of plantar nerve, left lower limb: Secondary | ICD-10-CM

## 2021-06-06 NOTE — Patient Instructions (Addendum)
L4/L5 Epidural [facility] [id number]  See you again in 5-6 weeks after epidural

## 2021-06-25 ENCOUNTER — Other Ambulatory Visit: Payer: Self-pay

## 2021-06-25 ENCOUNTER — Ambulatory Visit
Admission: RE | Admit: 2021-06-25 | Discharge: 2021-06-25 | Disposition: A | Discharge status: Home / Self Care | Payer: BC Managed Care – PPO | Source: Ambulatory Visit | Attending: Family Medicine | Admitting: Family Medicine

## 2021-06-25 DIAGNOSIS — G8929 Other chronic pain: Secondary | ICD-10-CM

## 2021-06-25 DIAGNOSIS — M5416 Radiculopathy, lumbar region: Secondary | ICD-10-CM | POA: Diagnosis not present

## 2021-06-25 IMAGING — XA Imaging study
1 series · 1 of 1 positions shown · non-contrast
Comparison: none

CLINICAL DATA: Low back pain.  Left lower extremity radiculopathy.

[Series 1: ortho adipose · 1 of 1 slices shown]
[im 1/1]
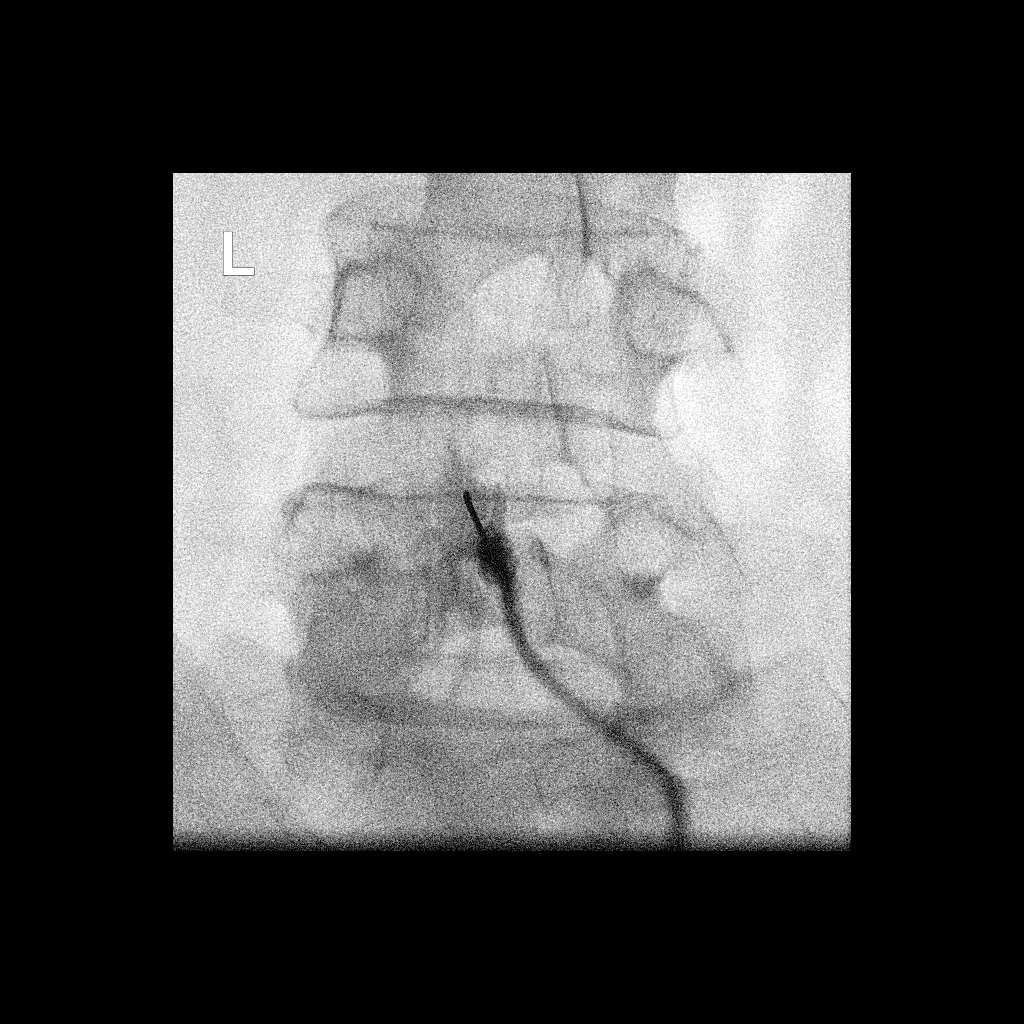

[1 of 1 positions shown; findings below may reference images not displayed]

FLUOROSCOPY TIME:  Radiation Exposure Index (as provided by the
fluoroscopic device): Dose area product 9.57 uGy*m2

PROCEDURE:
The procedure, risks, benefits, and alternatives were explained to
the patient. Questions regarding the procedure were encouraged and
answered. The patient understands and consents to the procedure.

LUMBAR EPIDURAL INJECTION:

An interlaminar approach was performed on left at L4-5. The
overlying skin was cleansed and anesthetized. A 20 gauge epidural
needle was advanced using loss-of-resistance technique.

DIAGNOSTIC EPIDURAL INJECTION:

Injection of Isovue-M 200 shows a good epidural pattern with spread
above and below the level of needle placement, primarily on the left
no vascular opacification is seen.

THERAPEUTIC EPIDURAL INJECTION:

80 mg of Depo-Medrol mixed with 1.5 mL 1% lidocaine were instilled.
The procedure was well-tolerated, and the patient was discharged
thirty minutes following the injection in good condition.

COMPLICATIONS:
None
IMPRESSION: Technically successful epidural injection on the left L4-5 # 1

## 2021-06-25 MED ORDER — METHYLPREDNISOLONE ACETATE 40 MG/ML INJ SUSP (RADIOLOG
80.0000 mg | Freq: Once | INTRAMUSCULAR | Status: AC
  Administered 2021-06-25: 09:00:00 80 mg via EPIDURAL

## 2021-06-25 MED ORDER — IOPAMIDOL (ISOVUE-M 200) INJECTION 41%
1.0000 mL | Freq: Once | INTRAMUSCULAR | Status: AC
  Administered 2021-06-25: 09:00:00 1 mL via EPIDURAL

## 2021-06-25 NOTE — Discharge Instructions (Signed)

## 2021-06-27 ENCOUNTER — Telehealth (INDEPENDENT_AMBULATORY_CARE_PROVIDER_SITE_OTHER): Payer: BC Managed Care – PPO | Admitting: Adult Health

## 2021-06-27 ENCOUNTER — Encounter: Payer: Self-pay | Admitting: Adult Health

## 2021-06-27 VITALS — Ht 64.0 in | Wt 137.0 lb

## 2021-06-27 DIAGNOSIS — J209 Acute bronchitis, unspecified: Secondary | ICD-10-CM | POA: Diagnosis not present

## 2021-06-27 DIAGNOSIS — J0140 Acute pansinusitis, unspecified: Secondary | ICD-10-CM

## 2021-06-27 MED ORDER — DOXYCYCLINE HYCLATE 100 MG PO CAPS: 100.0000 mg | ORAL_CAPSULE | Freq: Two times a day (BID) | ORAL | 0 refills | Status: DC

## 2021-06-27 MED ORDER — PREDNISONE 10 MG PO TABS: ORAL_TABLET | ORAL | 0 refills | Status: DC

## 2021-06-27 MED ORDER — ALBUTEROL SULFATE HFA 108 (90 BASE) MCG/ACT IN AERS: 2.0000 | INHALATION_SPRAY | Freq: Four times a day (QID) | RESPIRATORY_TRACT | 0 refills | Status: DC | PRN

## 2021-06-27 NOTE — Progress Notes (Signed)
Virtual Visit via Video Note  I connected with [name] on 06/27/21 at  8:30 AM EST by a video enabled telemedicine application and verified that I am speaking with the correct person using two identifiers.  Location patient: home Location provider:work or home office Persons participating in the virtual visit: patient, provider  I discussed the limitations of evaluation and management by telemedicine and the availability of in person appointments. The patient expressed understanding and agreed to proceed.   HPI: 65 year old female who I am seeing today for an acute issue. She is a patient of Dr. Alain Marion.   She reports having COVID on December 13, was not treated with antivirals.  Her symptoms resolved except for fatigue and cough that remained, she did start to feel better until about 4 days ago when she developed sinus congestion with sinus pain and pressure, worsening fatigue, nasal congestion, chest congestion, and a tight croupy cough.  Denies wheezing or shortness of breath.  She does have a history of asthma and bronchitis, does report that using a Qvar inhaler seems to help with the cough.  He denies fevers or chills.   ROS: See pertinent positives and negatives per HPI.  Past Medical History:  Diagnosis Date   ALLERGIC RHINITIS 03/02/2007   Anemia    ANXIETY 03/02/2007   Arthritis    ASTHMA 11/12/2007   Dr Orvil Feil   Cancer [facility])    skin, hx of   Dysrhythmia    pt. states at night will notice a different heart rhythem   Endometrial cancer ([place])    [place], HX OF 03/02/2007   GERD 03/02/2007   HYPOTHYROIDISM 11/12/2007   Dr Chalmers Cater   Neuromuscular disorder ([place])    carpel tunnel syndrome, compression of ulner nerve at elbows   OSTEOPENIA 11/12/2007   PEPTIC ULCER DISEASE 03/02/2007   Pneumonia    hx. of   Radiation 09/27/15-10/23/15   HDR to vaginal cuff 30 Gy   Stress fracture    TMJ SYNDROME 11/12/2007    Past Surgical History:  Procedure Laterality  Date   2008 TMJ surgery      CARPAL TUNNEL WITH CUBITAL TUNNEL Left 07/2019   carpel tunnel Right 11/20/2016   colonoscopy x 2     cubital tunnel release Right 11/20/2016   deviated septum surgery - 2007     DILATION AND CURETTAGE OF UTERUS     2017   endoscopy - 1990     Mandib advancement forward  2009   ROBOTIC ASSISTED TOTAL HYSTERECTOMY WITH BILATERAL SALPINGO OOPHERECTOMY Bilateral 08/14/2015   Procedure: XI ROBOTIC ASSISTED TOTAL HYSTERECTOMY WITH BILATERAL SALPINGO OOPHORECTOMY AND SENTAL LYMPH NODE BIOPSY;  Surgeon: Everitt Amber, MD;  Location: [facility];  Service: Gynecology;  Laterality: Bilateral;   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 , Disp: , Rfl:    cetirizine (ZYRTEC) 5 MG tablet, Take 5 mg by mouth 2 (two) times daily as needed for allergies., Disp: , Rfl:    COVID-19 mRNA bivalent vaccine,  Pfizer, (PFIZER COVID-19 VAC BIVALENT) injection, Inject into the muscle., Disp: 0.3 mL, Rfl: 0   Diclofenac Sodium 2 % SOLN, Place 2 g onto the skin 2 (two) times daily., Disp: 112 g, Rfl: 3   EPIPEN 2-PAK 0.3 MG/0.3ML DEVI, Inject 0.3 mg into the muscle once. Reported on 09/03/2015, Disp: , Rfl:    estradiol (ESTRACE) 0.1 MG/GM vaginal cream, PLACE 1 APPLICATORFUL VAGINALLY 3 (THREE) TIMES A WEEK, Disp: 42.5 g, Rfl: 12   famotidine (PEPCID) 40 MG tablet, TAKE 1 TABLET BY MOUTH EVERY DAY, Disp: 90 tablet, Rfl: 3   fluticasone (FLONASE) 50 MCG/ACT nasal spray, Place 1 spray into the nose daily as needed for allergies. Reported on 08/06/2015, Disp: , Rfl:    levothyroxine (SYNTHROID, LEVOTHROID) 25 MCG tablet, Take 25-50 mcg by mouth daily. She takes one tablet  daily 4 days per week (Monday-Thursday) and two tablets daily 3 days per week (Friday-Sunday)., Disp: , Rfl:    liothyronine (CYTOMEL) 5 MCG tablet, Take 5-10 mcg by mouth 2 (two) times daily. She takes one tablet in the morning and two tablets midday., Disp: , Rfl:    MELATONIN-CHAMOMILE PO, Take 1 capsule by mouth at bedtime as needed and may repeat dose one time if needed (For sleep.). , Disp: , Rfl:    METRONIDAZOLE, TOPICAL, 0.75 % LOTN, Apply 1 application topically at bedtime., Disp: , Rfl:    minoxidil (ROGAINE) 2 % external solution, Apply 1 application topically 4 (four) times a week. , Disp: , Rfl:    Multiple Vitamin (MULTIVITAMIN WITH MINERALS) TABS tablet, Take 1 tablet by mouth daily., Disp: , Rfl:    OVER THE COUNTER MEDICATION, Lion's Mane Extract 250 mg. 1 capsuleby mouth daily, Disp: , Rfl:    OVER THE COUNTER MEDICATION, Oreganol 140 mg by mouth daily., Disp: , Rfl:    OVER THE COUNTER MEDICATION, Nature's Made Advanced Dual Action Digestive probiotic  Takes 1 tablet daily, Disp: , Rfl:    Pancrelipase, Lip-Prot-Amyl, (ZENPEP) 40000-126000 units CPEP, Take 1 capsule three times a day Annual appt is due in must see provider for future refills, Disp: 90 capsule, Rfl: 0   predniSONE (DELTASONE) 20 MG tablet, Take 1 tablet (20 mg total) by mouth daily with breakfast., Disp: 7 tablet, Rfl: 0   pyridoxine (B-6) 200 MG tablet, Take 200 mg by mouth daily., Disp: , Rfl:    venlafaxine XR (EFFEXOR-XR) 37.5 MG 24 hr capsule, TAKE 1 CAPSULE BY MOUTH EVERY DAY WITH BREAKFAST (Patient taking differently: Take 37.5 mg by mouth. 3 times a week), Disp: 90 capsule, Rfl: 6   zoledronic acid (RECLAST) 5 MG/100ML SOLN injection, Inject 5 mg into the vein. Once a year, Disp: , Rfl:   EXAM:  VITALS per patient if applicable:  GENERAL: alert, oriented, appears well and in no acute distress  HEENT: atraumatic, conjunttiva clear, no obvious abnormalities on inspection of external nose and  ears  NECK: normal movements of the head and neck  LUNGS: on inspection no signs of respiratory distress, breathing rate appears normal, no obvious gross SOB, gasping or wheezing  CV: no obvious cyanosis  MS: moves all visible extremities without noticeable abnormality  PSYCH/NEURO: pleasant and cooperative, no obvious depression or anxiety, speech and thought processing grossly intact  ASSESSMENT AND PLAN:  Discussed the following assessment and plan:  1. Acute bronchitis, unspecified organism -We will treat for acute bronchitis after COVID-19 infection.  Send in prednisone butyryl inhaler.  Follow-up if not improving over the next  2 to 3 days - predniSONE (DELTASONE) 10 MG tablet; 40 mg x 3 days, 20 mg x 3 days, 10 mg x 3 days  Dispense: 21 tablet; Refill: 0 - albuterol (VENTOLIN HFA) 108 (90 Base) MCG/ACT inhaler; Inhale 2 puffs into the lungs every 6 (six) hours as needed for wheezing or shortness of breath.  Dispense: 8 g; Refill: 0  2. Acute non-recurrent pansinusitis -Also treat for acute sinusitis. - doxycycline (VIBRAMYCIN) 100 MG capsule; Take 1 capsule (100 mg total) by mouth 2 (two) times daily.  Dispense: 14 capsule; Refill: 0      I discussed the assessment and treatment plan with the patient. The patient was provided an opportunity to ask questions and all were answered. The patient agreed with the plan and demonstrated an understanding of the instructions.   The patient was advised to call back or seek an in-person evaluation if the symptoms worsen or if the condition fails to improve as anticipated.   Dorothyann Peng, NP

## 2021-07-02 ENCOUNTER — Telehealth: Payer: Self-pay

## 2021-07-02 DIAGNOSIS — J3081 Allergic rhinitis due to animal (cat) (dog) hair and dander: Secondary | ICD-10-CM | POA: Diagnosis not present

## 2021-07-02 DIAGNOSIS — J4520 Mild intermittent asthma, uncomplicated: Secondary | ICD-10-CM | POA: Diagnosis not present

## 2021-07-02 DIAGNOSIS — J3089 Other allergic rhinitis: Secondary | ICD-10-CM | POA: Diagnosis not present

## 2021-07-02 DIAGNOSIS — J301 Allergic rhinitis due to pollen: Secondary | ICD-10-CM | POA: Diagnosis not present

## 2021-07-02 NOTE — Telephone Encounter (Signed)
---  Caller states she's been on doxycycline and an inhaler, post Covid. Having some chest tightness. Caller states she has asthma controlled with weekly allergy shot and singulair.  07/02/2021 10:33:44 AM Go to ED Now [facility] [place], RN, Megan  Comments User: Sula Soda, RN Date/Time Eilene Ghazi Time): 07/02/2021 10:28:13 AM Covid symptoms started on 12/13 and tested positive on 12/16.  User: Sula Soda, RN Date/Time Eilene Ghazi Time): 07/02/2021 10:38:25 AM I attempted to call office backline x2. No answer. Called caller back and advised I was unable to reach anyone. She should call office back or proceed to to ER. Caller verbalized understanding.  Referrals GO TO FACILITY REFUSED

## 2021-07-04 NOTE — Telephone Encounter (Signed)
Pls sch OV or VOV w/any provider Thx

## 2021-07-20 ENCOUNTER — Other Ambulatory Visit: Payer: Self-pay | Admitting: Adult Health

## 2021-07-20 DIAGNOSIS — J209 Acute bronchitis, unspecified: Secondary | ICD-10-CM

## 2021-07-31 ENCOUNTER — Telehealth: Payer: Self-pay | Admitting: Internal Medicine

## 2021-07-31 NOTE — Telephone Encounter (Signed)
1.Medication Requested: Pancrelipase, Lip-Prot-Amyl, (ZENPEP) 40000-126000 units CPEP   2. Pharmacy (Name, [facility]): [facility] [place] [address]  Phone:  [phone] Fax:  [phone]   3. On Med List: yes  4. Last Visit with PCP: 09.28.21  5. Next visit date with PCP: n/a  **Patient says she had bronchitis after having covid dec 2022/jan 2023 & is needing the inhaler... says she is also taking the famotidine & probiotic provider recommended**   Advised patient provider may not refill due to not being seen in office since 02/2020   Agent: Please be advised that RX refills may take up to 3 business days. We ask that you follow-up with your pharmacy.

## 2021-07-31 NOTE — Telephone Encounter (Signed)
Pt  is overdue for appt Last saw md 07/2019. Must see provider and labs before renewal,,/lb

## 2021-08-15 DIAGNOSIS — M810 Age-related osteoporosis without current pathological fracture: Secondary | ICD-10-CM | POA: Diagnosis not present

## 2021-08-15 DIAGNOSIS — E039 Hypothyroidism, unspecified: Secondary | ICD-10-CM | POA: Diagnosis not present

## 2021-08-15 DIAGNOSIS — E559 Vitamin D deficiency, unspecified: Secondary | ICD-10-CM | POA: Diagnosis not present

## 2021-08-23 ENCOUNTER — Other Ambulatory Visit: Payer: Self-pay

## 2021-08-23 MED ORDER — ZENPEP 40000-126000 UNITS PO CPEP: ORAL_CAPSULE | ORAL | 0 refills | Status: DC

## 2021-08-30 ENCOUNTER — Other Ambulatory Visit: Payer: Self-pay

## 2021-08-30 ENCOUNTER — Ambulatory Visit (INDEPENDENT_AMBULATORY_CARE_PROVIDER_SITE_OTHER): Payer: BC Managed Care – PPO | Admitting: Nurse Practitioner

## 2021-08-30 VITALS — BP 110/72 | HR 63 | Temp 98.2°F | Ht 64.0 in | Wt 139.4 lb

## 2021-08-30 DIAGNOSIS — R0981 Nasal congestion: Secondary | ICD-10-CM

## 2021-08-30 DIAGNOSIS — J40 Bronchitis, not specified as acute or chronic: Secondary | ICD-10-CM

## 2021-08-30 DIAGNOSIS — J209 Acute bronchitis, unspecified: Secondary | ICD-10-CM

## 2021-08-30 LAB — BASIC METABOLIC PANEL
BUN: 18 mg/dL (ref 6–23)
CO2: 28 mEq/L (ref 19–32)
Calcium: 9.6 mg/dL (ref 8.4–10.5)
Chloride: 102 mEq/L (ref 96–112)
Creatinine, Ser: 0.76 mg/dL (ref 0.40–1.20)
GFR: 82.61 mL/min (ref >60.00)
Glucose, Bld: 103 mg/dL — ABNORMAL HIGH (ref 70–99)
Potassium: 3.9 mEq/L (ref 3.5–5.1)
Sodium: 138 mEq/L (ref 135–145)

## 2021-08-30 LAB — POC COVID19 BINAXNOW: SARS Coronavirus 2 Ag: NEGATIVE

## 2021-08-30 MED ORDER — PREDNISONE 10 MG PO TABS: ORAL_TABLET | ORAL | 0 refills | Status: DC

## 2021-08-30 NOTE — Progress Notes (Signed)
? ? ? ?Subjective:  ?Patient ID: [name], female    DOB: May 07, 1956  Age: 66 y.o. MRN: [id number] ? ?CC:  ?Chief Complaint  ?Patient presents with  ? Nasal Congestion  ? Cough  ?  X 1 week   ? Fatigue  ?  ? ? ?HPI  ?This patient arrives today for the above. ? ?Symptoms started about 8 days ago.  She been experiencing coughing, congestion, and shortness of breath especially with talking.  She is also been experiencing some wheezing.  She does have a history of asthma.  She is tested at home for COVID-19 twice both times were negative.  She has a history of having had a COVID-19 infection and is reporting today that all of her symptoms remind her a lot like her last COVID-19 infection.  She has been treating with over-the-counter treatment such as Mucinex and Delsym with mild improvement in her symptoms. ? ?Past Medical History:  ?Diagnosis Date  ? ALLERGIC RHINITIS 03/02/2007  ? Anemia   ? ANXIETY 03/02/2007  ? Arthritis   ? ASTHMA 11/12/2007  ? Dr Orvil Feil  ? Cancer [facility])   ? skin, hx of  ? Dysrhythmia   ? pt. states at night will notice a different heart rhythem  ? Endometrial cancer ([place])   ? FIBROCYSTIC BREAST DISEASE, HX OF 03/02/2007  ? GERD 03/02/2007  ? HYPOTHYROIDISM 11/12/2007  ? Dr Chalmers Cater  ? Neuromuscular disorder ([place])   ? carpel tunnel syndrome, compression of ulner nerve at elbows  ? OSTEOPENIA 11/12/2007  ? PEPTIC ULCER DISEASE 03/02/2007  ? Pneumonia   ? hx. of  ? Radiation 09/27/15-10/23/15  ? HDR to vaginal cuff 30 Gy  ? Stress fracture   ? TMJ SYNDROME 11/12/2007  ? ? ? ? ?Family History  ?Problem Relation Age of Onset  ? Hypertension Father   ? Heart disease Father 75  ?     chf  ? Heart disease Mother 82  ?     CAD  ? Hypertension Other   ? Asthma Neg Hx   ? ? ?Social History  ? ?Social History Narrative  ? Not on file  ? ?Social History  ? ?Tobacco Use  ? Smoking status: Never  ? Smokeless tobacco: Never  ? Tobacco comments:  ?  Married, 1 child  ?Substance Use Topics  ? Alcohol use: Yes  ?  Alcohol/week:  2.0 standard drinks  ?  Types: 1 Glasses of wine, 1 Cans of beer per week  ?  Comment: daily  ? ? ? ?Current Meds  ?Medication Sig  ? albuterol (VENTOLIN HFA) 108 (90 Base) MCG/ACT inhaler Inhale 2 puffs into the lungs every 6 (six) hours as needed for wheezing or shortness of breath.  ? beclomethasone (QVAR) 40 MCG/ACT inhaler Inhale 2 puffs into the lungs 2 (two) times daily.  ? Calcium Citrate-Vitamin D (CALCIUM CITRATE +D PO) Take 2 tablets by mouth daily.   ? cetirizine (ZYRTEC) 5 MG tablet Take 5 mg by mouth 2 (two) times daily as needed for allergies.  ? COVID-19 mRNA bivalent vaccine, Pfizer, (PFIZER COVID-19 VAC BIVALENT) injection Inject into the muscle.  ? Diclofenac Sodium 2 % SOLN Place 2 g onto the skin 2 (two) times daily.  ? EPIPEN 2-PAK 0.3 MG/0.3ML DEVI Inject 0.3 mg into the muscle once. Reported on 09/03/2015  ? estradiol (ESTRACE) 0.1 MG/GM vaginal cream PLACE 1 APPLICATORFUL VAGINALLY 3 (THREE) TIMES A WEEK  ? famotidine (PEPCID) 40 MG tablet TAKE 1  TABLET BY MOUTH EVERY DAY  ? fluticasone (FLONASE) 50 MCG/ACT nasal spray Place 1 spray into the nose daily as needed for allergies. Reported on 08/06/2015  ? levothyroxine (SYNTHROID, LEVOTHROID) 25 MCG tablet Take 25-50 mcg by mouth daily. She takes one tablet daily 4 days per week (Monday-Thursday) and two tablets daily 3 days per week (Friday-Sunday).  ? liothyronine (CYTOMEL) 5 MCG tablet Take 5-10 mcg by mouth 2 (two) times daily. She takes one tablet in the morning and two tablets midday.  ? MELATONIN-CHAMOMILE PO Take 1 capsule by mouth at bedtime as needed and may repeat dose one time if needed (For sleep.).   ? METRONIDAZOLE, TOPICAL, 0.75 % LOTN Apply 1 application topically at bedtime.  ? minoxidil (ROGAINE) 2 % external solution Apply 1 application topically 4 (four) times a week.   ? Multiple Vitamin (MULTIVITAMIN WITH MINERALS) TABS tablet Take 1 tablet by mouth daily.  ? OVER THE COUNTER MEDICATION Lion's Mane Extract 250 mg. 1 capsuleby  mouth daily  ? OVER THE COUNTER MEDICATION Nature's Made Advanced Dual Action Digestive probiotic  Takes 1 tablet daily  ? Pancrelipase, Lip-Prot-Amyl, (ZENPEP) 40000-126000 units CPEP Take 1 capsule three times a day Annual appt is due in must see provider for future refills  ? pyridoxine (B-6) 200 MG tablet Take 200 mg by mouth daily.  ? venlafaxine XR (EFFEXOR-XR) 37.5 MG 24 hr capsule TAKE 1 CAPSULE BY MOUTH EVERY DAY WITH BREAKFAST (Patient taking differently: Take 37.5 mg by mouth. 3 times a week)  ? zoledronic acid (RECLAST) 5 MG/100ML SOLN injection Inject 5 mg into the vein. Once a year  ? ? ?ROS:  ?Review of Systems  ?Constitutional:  Negative for chills and fever.  ?HENT:  Positive for congestion.   ?     (+) PND,   ?Respiratory:  Positive for cough, shortness of breath (with talking, mild with activity) and wheezing.   ?Cardiovascular:  Negative for chest pain.  ?Neurological:  Negative for headaches.  ? ? ?Objective:  ? ?Today's Vitals: BP 110/72   Pulse 63   Temp 98.2 ?F (36.8 ?C) (Oral)   Ht 5\' 4"  (1.626 m)   Wt 139 lb 6 oz (63.2 kg)   SpO2 96%   BMI 23.92 kg/m?  ?Vitals with BMI 08/30/2021 06/27/2021 06/25/2021  ?Height 5\' 4"  5\' 4"  -  ?Weight 139 lbs 6 oz 137 lbs -  ?BMI 23.91 23.5 -  ?Systolic 263 - 785  ?Diastolic 72 - 66  ?Pulse 63 - 69  ?  ? ?Physical Exam ?Vitals reviewed.  ?Constitutional:   ?   General: She is not in acute distress. ?   Appearance: Normal appearance.  ?HENT:  ?   Head: Normocephalic and atraumatic.  ?Neck:  ?   Vascular: No carotid bruit.  ?Cardiovascular:  ?   Rate and Rhythm: Normal rate and regular rhythm.  ?   Pulses: Normal pulses.  ?   Heart sounds: Normal heart sounds.  ?Pulmonary:  ?   Effort: Pulmonary effort is normal.  ?   Breath sounds: Wheezing present.  ?Skin: ?   General: Skin is warm and dry.  ?Neurological:  ?   General: No focal deficit present.  ?   Mental Status: She is alert and oriented to person, place, and time.  ?Psychiatric:     ?   Mood and  Affect: Mood normal.     ?   Behavior: Behavior normal.     ?   Judgment: Judgment  normal.  ? ? ? ? ?POC COVID : negative ? ? ?Assessment and Plan  ? ?1. Bronchitis   ?2. Nasal congestion   ?3. Acute bronchitis, unspecified organism   ? ? ? ?Plan: ?1.  Will treat with short course of steroids.  She continues on her Qvar inhaler as well as albuterol inhaler as needed.  We also discussed Tessalon Perles which she has available at home and she will use this for cough suppression as well as Mucinex as needed.  She has which she can take to help her sleep because steroids do cause insomnia for her.  She tells me she has access to hydroxyzine at home I recommended she take 25 to 50 mg as needed at night for sleeping if Benadryl does not help.  She reports her understanding.  She is overdue for blood work so we will have her have basic metabolic panel collected today to monitor kidney function and will call her with further recommendations if any abnormalities are noted.  She reports her understanding.  She was told to call the office if symptoms progress especially if she starts to experience high fevers, worsening shortness of breath, or increase sputum production.  She reports understanding. ? ? ?Tests ordered ?Orders Placed This Encounter  ?Procedures  ? Basic Metabolic Panel (BMET)  ? POC COVID-19  ? ? ? ? ?Meds ordered this encounter  ?Medications  ? predniSONE (DELTASONE) 10 MG tablet  ?  Sig: 40 mg x 3 days, 20 mg x 3 days, 10 mg x 3 days  ?  Dispense:  21 tablet  ?  Refill:  0  ?  Order Specific Question:   Supervising Provider  ?  Answer:   Binnie Rail [[id number]]  ? ? ?Patient to follow-up within the next month or 2 for annual physical exam with PCP, or sooner as needed. ? ?Ailene Ards, NP ? ?

## 2021-09-04 DIAGNOSIS — J4531 Mild persistent asthma with (acute) exacerbation: Secondary | ICD-10-CM | POA: Diagnosis not present

## 2021-09-04 DIAGNOSIS — J3089 Other allergic rhinitis: Secondary | ICD-10-CM | POA: Diagnosis not present

## 2021-09-04 DIAGNOSIS — J4520 Mild intermittent asthma, uncomplicated: Secondary | ICD-10-CM | POA: Diagnosis not present

## 2021-09-04 DIAGNOSIS — H1045 Other chronic allergic conjunctivitis: Secondary | ICD-10-CM | POA: Diagnosis not present

## 2021-09-04 DIAGNOSIS — J301 Allergic rhinitis due to pollen: Secondary | ICD-10-CM | POA: Diagnosis not present

## 2021-09-04 DIAGNOSIS — J3081 Allergic rhinitis due to animal (cat) (dog) hair and dander: Secondary | ICD-10-CM | POA: Diagnosis not present

## 2021-09-17 ENCOUNTER — Ambulatory Visit (INDEPENDENT_AMBULATORY_CARE_PROVIDER_SITE_OTHER): Payer: BC Managed Care – PPO | Admitting: Internal Medicine

## 2021-09-17 ENCOUNTER — Encounter: Payer: Self-pay | Admitting: Internal Medicine

## 2021-09-17 ENCOUNTER — Other Ambulatory Visit: Payer: Self-pay

## 2021-09-17 VITALS — BP 140/76 | HR 61 | Temp 97.8°F | Ht 64.0 in | Wt 139.2 lb

## 2021-09-17 DIAGNOSIS — Z0000 Encounter for general adult medical examination without abnormal findings: Secondary | ICD-10-CM | POA: Diagnosis not present

## 2021-09-17 DIAGNOSIS — Z23 Encounter for immunization: Secondary | ICD-10-CM

## 2021-09-17 DIAGNOSIS — K3184 Gastroparesis: Secondary | ICD-10-CM | POA: Diagnosis not present

## 2021-09-17 MED ORDER — ZENPEP 40000-126000 UNITS PO CPEP: ORAL_CAPSULE | ORAL | 11 refills | Status: DC

## 2021-09-17 NOTE — Assessment & Plan Note (Signed)

## 2021-09-17 NOTE — Progress Notes (Signed)
? ?Subjective:  ?Patient ID: [name], female    DOB: [birth year]  Age: 66 y.o. MRN: [id number] ? ?CC: Annual Exam ? ? ?HPI ?[name] presents for well exam ?C/o asthma. Now on Breo ? ? ?Outpatient Medications Prior to Visit  ?Medication Sig Dispense Refill  ? albuterol (VENTOLIN HFA) 108 (90 Base) MCG/ACT inhaler Inhale 2 puffs into the lungs every 6 (six) hours as needed for wheezing or shortness of breath. 8 g 0  ? Calcium Citrate-Vitamin D (CALCIUM CITRATE +D PO) Take 2 tablets by mouth daily.     ? cetirizine (ZYRTEC) 5 MG tablet Take 5 mg by mouth 2 (two) times daily as needed for allergies.    ? Diclofenac Sodium 2 % SOLN Place 2 g onto the skin 2 (two) times daily. 112 g 3  ? docusate sodium (COLACE) 100 MG capsule Take 100 mg by mouth 2 (two) times daily.    ? EPIPEN 2-PAK 0.3 MG/0.3ML DEVI Inject 0.3 mg into the muscle once. Reported on 09/03/2015    ? estradiol (ESTRACE) 0.1 MG/GM vaginal cream PLACE 1 APPLICATORFUL VAGINALLY 3 (THREE) TIMES A WEEK 42.5 g 12  ? famotidine (PEPCID) 40 MG tablet TAKE 1 TABLET BY MOUTH EVERY DAY 90 tablet 3  ? fluticasone (FLONASE) 50 MCG/ACT nasal spray Place 1 spray into the nose daily as needed for allergies. Reported on 08/06/2015    ? fluticasone furoate-vilanterol (BREO ELLIPTA) 100-25 MCG/ACT AEPB     ? IPRATROPIUM BROMIDE HFA IN     ? levothyroxine (SYNTHROID, LEVOTHROID) 25 MCG tablet Take 25-50 mcg by mouth daily. She takes one tablet daily 4 days per week (Monday-Thursday) and two tablets daily 3 days per week (Friday-Sunday).    ? liothyronine (CYTOMEL) 5 MCG tablet Take 5-10 mcg by mouth 2 (two) times daily. She takes one tablet in the morning and two tablets midday.    ? MELATONIN-CHAMOMILE PO Take 1 capsule by mouth at bedtime as needed and may repeat dose one time if needed (For sleep.).     ? METRONIDAZOLE, TOPICAL, 0.75 % LOTN Apply 1 application topically at bedtime.    ? minoxidil (ROGAINE) 2 % external solution Apply 1 application topically 4  (four) times a week.     ? Multiple Vitamin (MULTIVITAMIN WITH MINERALS) TABS tablet Take 1 tablet by mouth daily.    ? OVER THE COUNTER MEDICATION [place] Extract 250 mg. 1 capsuleby mouth daily    ? OVER THE COUNTER MEDICATION Oreganol 140 mg by mouth daily.    ? pyridoxine (B-6) 200 MG tablet Take 200 mg by mouth daily.    ? venlafaxine XR (EFFEXOR-XR) 37.5 MG 24 hr capsule TAKE 1 CAPSULE BY MOUTH EVERY DAY WITH BREAKFAST (Patient taking differently: Take 37.5 mg by mouth. 3 times a week) 90 capsule 6  ? zoledronic acid (RECLAST) 5 MG/100ML SOLN injection Inject 5 mg into the vein. Once a year    ? Pancrelipase, Lip-Prot-Amyl, (ZENPEP) 40000-126000 units CPEP Take 1 capsule three times a day Annual appt is due in must see provider for future refills 90 capsule 0  ? beclomethasone (QVAR) 40 MCG/ACT inhaler Inhale 2 puffs into the lungs 2 (two) times daily.    ? COVID-19 mRNA bivalent vaccine, Pfizer, (PFIZER COVID-19 VAC BIVALENT) injection Inject into the muscle. 0.3 mL 0  ? OVER THE COUNTER MEDICATION [employer] Advanced Dual Action Digestive probiotic  Takes 1 tablet daily    ? predniSONE (DELTASONE) 10 MG tablet 40  mg x 3 days, 20 mg x 3 days, 10 mg x 3 days 21 tablet 0  ? ?No facility-administered medications prior to visit.  ? ? ?ROS: ?Review of Systems  ?Constitutional:  Negative for activity change, appetite change, chills, fatigue and unexpected weight change.  ?HENT:  Negative for congestion, mouth sores and sinus pressure.   ?Eyes:  Negative for visual disturbance.  ?Respiratory:  Negative for cough and chest tightness.   ?Gastrointestinal:  Negative for abdominal pain and nausea.  ?Genitourinary:  Negative for difficulty urinating, frequency and vaginal pain.  ?Musculoskeletal:  Negative for back pain and gait problem.  ?Skin:  Negative for pallor and rash.  ?Neurological:  Negative for dizziness, tremors, weakness, numbness and headaches.  ?Psychiatric/Behavioral:  Negative for confusion and  sleep disturbance.   ? ?Objective:  ?BP 140/76 (BP Location: Right Arm, Patient Position: Sitting, Cuff Size: Normal)   Pulse 61   Temp 97.8 ?F (36.6 ?C) (Oral)   Ht '5\' 4"'$  (1.626 m)   Wt 139 lb 3.2 oz (63.1 kg)   SpO2 97%   BMI 23.89 kg/m?  ? ?BP Readings from Last 3 Encounters:  ?09/17/21 140/76  ?08/30/21 110/72  ?06/25/21 118/66  ? ? ?Wt Readings from Last 3 Encounters:  ?09/17/21 139 lb 3.2 oz (63.1 kg)  ?08/30/21 139 lb 6 oz (63.2 kg)  ?06/27/21 137 lb (62.1 kg)  ? ? ?Physical Exam ?Constitutional:   ?   General: She is not in acute distress. ?   Appearance: She is well-developed.  ?HENT:  ?   Head: Normocephalic.  ?   Right Ear: External ear normal.  ?   Left Ear: External ear normal.  ?   Nose: Nose normal.  ?Eyes:  ?   General:     ?   Right eye: No discharge.     ?   Left eye: No discharge.  ?   Conjunctiva/sclera: Conjunctivae normal.  ?   Pupils: Pupils are equal, round, and reactive to light.  ?Neck:  ?   Thyroid: No thyromegaly.  ?   Vascular: No JVD.  ?   Trachea: No tracheal deviation.  ?Cardiovascular:  ?   Rate and Rhythm: Normal rate and regular rhythm.  ?   Heart sounds: Normal heart sounds.  ?Pulmonary:  ?   Effort: No respiratory distress.  ?   Breath sounds: No stridor. No wheezing.  ?Abdominal:  ?   General: Bowel sounds are normal. There is no distension.  ?   Palpations: Abdomen is soft. There is no mass.  ?   Tenderness: There is no abdominal tenderness. There is no guarding or rebound.  ?Musculoskeletal:     ?   General: No tenderness.  ?   Cervical back: Normal range of motion and neck supple. No rigidity.  ?Lymphadenopathy:  ?   Cervical: No cervical adenopathy.  ?Skin: ?   Findings: No erythema or rash.  ?Neurological:  ?   Cranial Nerves: No cranial nerve deficit.  ?   Motor: No abnormal muscle tone.  ?   Coordination: Coordination normal.  ?   Deep Tendon Reflexes: Reflexes normal.  ?Psychiatric:     ?   Behavior: Behavior normal.     ?   Thought Content: Thought content normal.      ?   Judgment: Judgment normal.  ? ? ?Lab Results  ?Component Value Date  ? WBC 5.7 09/18/2021  ? HGB 13.4 09/18/2021  ? HCT 39.4 09/18/2021  ? PLT  205.0 09/18/2021  ? GLUCOSE 93 09/18/2021  ? CHOL 253 (H) 09/18/2021  ? TRIG 106.0 09/18/2021  ? HDL 104.40 09/18/2021  ? LDLDIRECT 102.8 11/11/2010  ? LDLCALC 128 (H) 09/18/2021  ? ALT 18 09/18/2021  ? AST 18 09/18/2021  ? NA 134 (L) 09/18/2021  ? K 3.5 09/18/2021  ? CL 99 09/18/2021  ? CREATININE 0.75 09/18/2021  ? BUN 19 09/18/2021  ? CO2 28 09/18/2021  ? TSH 0.74 09/18/2021  ? ? ?DG INJECT DIAG/THERA/INC NEEDLE/CATH/PLC EPI/LUMB/SAC W/IMG ? ?Result Date: 06/25/2021 ?CLINICAL DATA:  Low back pain.  Left lower extremity radiculopathy. FLUOROSCOPY TIME:  Radiation Exposure Index (as provided by the fluoroscopic device): Dose area product 9.57 uGy*m2 PROCEDURE: The procedure, risks, benefits, and alternatives were explained to the patient. Questions regarding the procedure were encouraged and answered. The patient understands and consents to the procedure. LUMBAR EPIDURAL INJECTION: An interlaminar approach was performed on left at L4-5. The overlying skin was cleansed and anesthetized. A 20 gauge epidural needle was advanced using loss-of-resistance technique. DIAGNOSTIC EPIDURAL INJECTION: Injection of Isovue-M 200 shows a good epidural pattern with spread above and below the level of needle placement, primarily on the left no vascular opacification is seen. THERAPEUTIC EPIDURAL INJECTION: 80 mg of Depo-Medrol mixed with 1.5 mL 1% lidocaine were instilled. The procedure was well-tolerated, and the patient was discharged thirty minutes following the injection in good condition. COMPLICATIONS: None IMPRESSION: Technically successful epidural injection on the left L4-5 # 1 Electronically Signed   By: San Morelle M.D.   On: 06/25/2021 09:01  ? ? ?Assessment & Plan:  ? ?Problem List Items Addressed This Visit   ? ? Well adult exam - Primary  ?   ?We discussed  age appropriate health related issues, including available/recomended screening tests and vaccinations. Labs were ordered to be later reviewed . All questions were answered. We discussed one or more of the follow

## 2021-09-17 NOTE — Patient Instructions (Signed)
? ? ?Gluten free trial for 4-6 weeks. OK to use gluten-free bread and gluten-free pasta. ? ? ? ?Gluten-Free Diet for Celiac Disease, Adult ? ?The gluten-free diet includes all foods that do not contain gluten. Gluten is a protein that is found in wheat, rye, barley, and some other grains. Following the gluten-free diet is the only treatment for people with celiac disease. It helps to prevent damage to the intestines and improves or eliminates the symptoms of celiac disease. ?Following the gluten-free diet requires some planning. It can be challenging at first, but it gets easier with time and practice. There are more gluten-free options available today than ever before. If you need help finding gluten-free foods or if you have questions, talk with your diet and nutrition specialist (registered dietitian) or your health care provider. ?What do I need to know about a gluten-free diet? ?All fruits, vegetables, and meats are safe to eat and do not contain gluten. ?When grocery shopping, start by shopping in the produce, meat, and dairy sections. These sections are more likely to contain gluten-free foods. Then move to the aisles that contain packaged foods if you need to. ?Read all food labels. Gluten is often added to foods. Always check the ingredient list and look for warnings, such as ?may contain gluten." ?Talk with your dietitian or health care provider before taking a gluten-free multivitamin or mineral supplement. ?Be aware of gluten-free foods having contact with foods that contain gluten (cross-contamination). This can happen at home and with any processed foods. ?Talk with your health care provider or dietitian about how to reduce the risk of cross-contamination in your home. ?If you have questions about how a food is processed, ask the manufacturer. ?What key words help to identify gluten? ?Foods that list any of these key words on the label usually contain gluten: ?Wheat, flour, enriched flour, bromated  flour, white flour, durum flour, graham flour, phosphated flour, self-rising flour, semolina, farina, barley (malt), rye, and oats. ?Starch, dextrin, modified food starch, or cereal. ?Thickening, fillers, or emulsifiers. ?Malt flavoring, malt extract, or malt syrup. ?Hydrolyzed vegetable protein. ? ?In the [place] packaged foods that are gluten-free are required to be labeled ?GF.? These foods should be easy to identify and are safe to eat. In the [place] food companies are also required to list common food allergens, including wheat, on their labels. ?Recommended foods ?Grains ?Amaranth, bean flours, 100% buckwheat flour, corn, millet, nut flours or nut meals, GF oats, quinoa, rice, sorghum, teff, rice wafers, pure cornmeal tortillas, popcorn, and hot cereals made from cornmeal. Hominy, rice, wild rice. Some Asian rice noodles or bean noodles. Arrowroot starch, corn bran, corn flour, corn germ, cornmeal, corn starch, potato flour, potato starch flour, and rice bran. Plain, brown, and sweet rice flours. Rice polish, soy flour, and tapioca starch. ?Vegetables ?All plain fresh, frozen, and canned vegetables. ?Fruits ?All plain fresh, frozen, canned, and dried fruits, and 100% fruit juices. ?Meats and other protein foods ?All fresh beef, pork, poultry, fish, seafood, and eggs. Fish canned in water, oil, brine, or vegetable broth. Plain nuts and seeds, peanut butter. Some lunch meat and some frankfurters. Dried beans, dried peas, and lentils. ?Dairy ?Fresh plain, dry, evaporated, or condensed milk. Cream, butter, sour cream, whipping cream, and most yogurts. Unprocessed cheese, most processed cheeses, some cottage cheese, some cream cheeses. ?Beverages ?Coffee, tea, most herbal teas. Carbonated beverages and some root beers. Wine, sake, and distilled spirits, such as gin, vodka, and whiskey. Most hard ciders. ?Fats and oils ?  Butter, margarine, vegetable oil, hydrogenated butter, olive oil, shortening, lard, cream, and some  mayonnaise. Some commercial salad dressings. Olives. ?Sweets and desserts ?Sugar, honey, some syrups, molasses, jelly, and jam. Plain hard candy, marshmallows, and gumdrops. Pure cocoa powder. Plain chocolate. Custard and some pudding mixes. Gelatin desserts, sorbets, frozen ice pops, and sherbet. Cake, cookies, and other desserts prepared with allowed flours. Some commercial ice creams. Cornstarch, tapioca, and rice puddings. ?Seasoning and other foods ?Some canned or frozen soups. Monosodium glutamate (MSG). Cider, rice, and wine vinegar. Baking soda and baking powder. Cream of tartar. Baking and nutritional yeast. Certain soy sauces made without wheat (ask your dietitian about specific brands that are allowed). Nuts, coconut, and chocolate. Salt, pepper, herbs, spices, flavoring extracts, imitation or artificial flavorings, natural flavorings, and food colorings. Some medicines and supplements. Some lip glosses and other cosmetics. Rice syrups. ?The items listed may not be a complete list. Talk with your dietitian about what dietary choices are best for you. ?Foods to avoid ?Grains ?Barley, bran, bulgur, couscous, cracked wheat, [place], farro, graham, malt, matzo, semolina, wheat germ, and all wheat and rye cereals including spelt and kamut. Cereals containing malt as a flavoring, such as rice cereal. Noodles, spaghetti, macaroni, most packaged rice mixes, and all mixes containing wheat, rye, barley, or triticale. ?Vegetables ?Most creamed vegetables and most vegetables canned in sauces. Some commercially prepared vegetables and salads. ?Fruits ?Thickened or prepared fruits and some pie fillings. Some fruit snacks and fruit roll-ups. ?Meats and other protein foods ?Any meat or meat alternative containing wheat, rye, barley, or gluten stabilizers. These are often marinated or packaged meats and lunch meats. Bread-containing products, such as Swiss steak, croquettes, meatballs, and meatloaf. Most tuna canned in  vegetable broth and [place] with hydrolyzed vegetable protein (HVP) injected as part of the basting. [place]. Imitation fish. Eggs in sauces made from ingredients to avoid. ?Dairy ?Commercial chocolate milk drinks and malted milk. Some non-dairy creamers. Any cheese product containing ingredients to avoid. ?Beverages ?Certain cereal beverages. Beer, ale, malted milk, and some root beers. Some hard ciders. Some instant flavored coffees. Some herbal teas made with barley or with barley malt added. ?Fats and oils ?Some commercial salad dressings. Sour cream containing modified food starch. ?Sweets and desserts ?Some toffees. Chocolate-coated nuts (may be rolled in wheat flour) and some commercial candies and candy bars. Most cakes, cookies, donuts, pastries, and other baked goods. Some commercial ice cream. Ice cream cones. Commercially prepared mixes for cakes, cookies, and other desserts. Bread pudding and other puddings thickened with flour. Products containing brown rice syrup made with barley malt enzyme. Desserts and sweets made with malt flavoring. ?Seasoning and other foods ?Some curry powders, some dry seasoning mixes, some gravy extracts, some meat sauces, some ketchups, some prepared mustards, and horseradish. Certain soy sauces. Malt vinegar. Bouillon and bouillon cubes that contain HVP. Some chip dips, and some chewing gum. Yeast extract. Brewer?s yeast. Caramel color. Some medicines and supplements. Some lip glosses and other cosmetics. ?The items listed may not be a complete list. Talk with your dietitian about what dietary choices are best for you. ?Summary ?Gluten is a protein that is found in wheat, rye, barley, and some other grains. The gluten-free diet includes all foods that do not contain gluten. ?If you need help finding gluten-free foods or if you have questions, talk with your diet and nutrition specialist (registered dietitian) or your health care provider. ?Read all food labels. Gluten is  often added to foods. Always check the  ingredient list and look for warnings, such as ?may contain gluten." ?This information is not intended to replace advice given to you by your health care provider. Make s

## 2021-09-17 NOTE — Assessment & Plan Note (Signed)
Worse ?Problems after Zpack ?Back on Zenpep,  Align ?

## 2021-09-18 ENCOUNTER — Other Ambulatory Visit (INDEPENDENT_AMBULATORY_CARE_PROVIDER_SITE_OTHER): Payer: BC Managed Care – PPO

## 2021-09-18 DIAGNOSIS — Z0000 Encounter for general adult medical examination without abnormal findings: Secondary | ICD-10-CM | POA: Diagnosis not present

## 2021-09-18 LAB — COMPREHENSIVE METABOLIC PANEL
ALT: 18 U/L (ref 0–35)
AST: 18 U/L (ref 0–37)
Albumin: 4.2 g/dL (ref 3.5–5.2)
Alkaline Phosphatase: 65 U/L (ref 39–117)
BUN: 19 mg/dL (ref 6–23)
CO2: 28 mEq/L (ref 19–32)
Calcium: 10.3 mg/dL (ref 8.4–10.5)
Chloride: 99 mEq/L (ref 96–112)
Creatinine, Ser: 0.75 mg/dL (ref 0.40–1.20)
GFR: 83.9 mL/min (ref >60.00)
Glucose, Bld: 93 mg/dL (ref 70–99)
Potassium: 3.5 mEq/L (ref 3.5–5.1)
Sodium: 134 mEq/L — ABNORMAL LOW (ref 135–145)
Total Bilirubin: 0.8 mg/dL (ref 0.2–1.2)
Total Protein: 6.4 g/dL (ref 6.0–8.3)

## 2021-09-18 LAB — CBC WITH DIFFERENTIAL/PLATELET
Basophils Absolute: 0 10*3/uL (ref 0.0–0.1)
Basophils Relative: 0.3 % (ref 0.0–3.0)
Eosinophils Absolute: 0.2 10*3/uL (ref 0.0–0.7)
Eosinophils Relative: 3.8 % (ref 0.0–5.0)
HCT: 39.4 % (ref 36.0–46.0)
Hemoglobin: 13.4 g/dL (ref 12.0–15.0)
Lymphocytes Relative: 23.8 % (ref 12.0–46.0)
Lymphs Abs: 1.4 10*3/uL (ref 0.7–4.0)
MCHC: 34 g/dL (ref 30.0–36.0)
MCV: 93.4 fl (ref 78.0–100.0)
Monocytes Absolute: 0.4 10*3/uL (ref 0.1–1.0)
Monocytes Relative: 7.8 % (ref 3.0–12.0)
Neutro Abs: 3.7 10*3/uL (ref 1.4–7.7)
Neutrophils Relative %: 64.3 % (ref 43.0–77.0)
Platelets: 205 10*3/uL (ref 150.0–400.0)
RBC: 4.22 Mil/uL (ref 3.87–5.11)
RDW: 13.6 % (ref 11.5–15.5)
WBC: 5.7 10*3/uL (ref 4.0–10.5)

## 2021-09-18 LAB — URINALYSIS
Bilirubin Urine: NEGATIVE
Hgb urine dipstick: NEGATIVE
Ketones, ur: NEGATIVE
Leukocytes,Ua: NEGATIVE
Nitrite: NEGATIVE
Specific Gravity, Urine: 1.01 (ref 1.000–1.030)
Total Protein, Urine: NEGATIVE
Urine Glucose: NEGATIVE
Urobilinogen, UA: 0.2 (ref 0.0–1.0)
pH: 7 (ref 5.0–8.0)

## 2021-09-18 LAB — LIPID PANEL
Cholesterol: 253 mg/dL — ABNORMAL HIGH (ref 0–200)
HDL: 104.4 mg/dL (ref >39.00)
LDL Cholesterol: 128 mg/dL — ABNORMAL HIGH (ref 0–99)
NonHDL: 148.99
Total CHOL/HDL Ratio: 2
Triglycerides: 106 mg/dL (ref 0.0–149.0)
VLDL: 21.2 mg/dL (ref 0.0–40.0)

## 2021-09-18 LAB — TSH: TSH: 0.74 u[IU]/mL (ref 0.35–5.50)

## 2021-09-26 LAB — HM DEXA SCAN

## 2021-10-17 DIAGNOSIS — L821 Other seborrheic keratosis: Secondary | ICD-10-CM | POA: Diagnosis not present

## 2021-10-17 DIAGNOSIS — L720 Epidermal cyst: Secondary | ICD-10-CM | POA: Diagnosis not present

## 2021-10-17 DIAGNOSIS — Z85828 Personal history of other malignant neoplasm of skin: Secondary | ICD-10-CM | POA: Diagnosis not present

## 2021-10-17 DIAGNOSIS — L814 Other melanin hyperpigmentation: Secondary | ICD-10-CM | POA: Diagnosis not present

## 2021-11-28 NOTE — Progress Notes (Unsigned)
[place] Phone: [phone] Subjective:   [name], am serving as a scribe for Dr. Hulan Saas.   I'm seeing this patient by the request  of:  Plotnikov, [name], MD  CC: Right knee pain  [id number]  Last seen in 2022 for foot pain  Updated 11/29/2021 [name] is a 66 y.o. female coming in with complaint of right knee pain. Pain began after riding in the car 2 weeks ago for a long ride. Pain is located over medial joint line and in posterior lateral hamstring. RICE tx. Antalgic gait.            Past Medical History:  Diagnosis Date   ALLERGIC RHINITIS 03/02/2007   Anemia    ANXIETY 03/02/2007   Arthritis    ASTHMA 11/12/2007   Dr Orvil Feil   Cancer [facility])    skin, hx of   Dysrhythmia    pt. states at night will notice a different heart rhythem   Endometrial cancer ([place])    [place], HX OF 03/02/2007   GERD 03/02/2007   HYPOTHYROIDISM 11/12/2007   Dr Chalmers Cater   Neuromuscular disorder ([place])    carpel tunnel syndrome, compression of ulner nerve at elbows   OSTEOPENIA 11/12/2007   PEPTIC ULCER DISEASE 03/02/2007   Pneumonia    hx. of   Radiation 09/27/15-10/23/15   HDR to vaginal cuff 30 Gy   Stress fracture    TMJ SYNDROME 11/12/2007   Past Surgical History:  Procedure Laterality Date   2008 TMJ surgery      CARPAL TUNNEL WITH CUBITAL TUNNEL Left 07/2019   carpel tunnel Right 11/20/2016   colonoscopy x 2     cubital tunnel release Right 11/20/2016   deviated septum surgery - 2007     DILATION AND CURETTAGE OF UTERUS     2017   endoscopy - 1990     Mandib advancement forward  2009   ROBOTIC ASSISTED TOTAL HYSTERECTOMY WITH BILATERAL SALPINGO OOPHERECTOMY Bilateral 08/14/2015   Procedure: XI ROBOTIC ASSISTED TOTAL HYSTERECTOMY WITH BILATERAL SALPINGO OOPHORECTOMY AND SENTAL LYMPH NODE BIOPSY;  Surgeon: Everitt Amber, MD;  Location: [facility];  Service: Gynecology;   Laterality: Bilateral;   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     Social History   Socioeconomic History   Marital status: Married    Spouse name: Not on file   Number of children: Not on file   Years of education: Not on file   Highest education level: Not on file  Occupational History   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[employer]  Tobacco Use   Smoking status: Never   Smokeless tobacco: Never   Tobacco comments:    Married, 1 child  Vaping Use   Vaping Use: Never used  Substance and Sexual Activity   Alcohol use: Yes    Alcohol/week: 2.0 standard drinks    Types: 1 Glasses of wine, 1 Cans of beer per week    Comment: daily   Drug use: No   Sexual activity: Yes  Other Topics Concern   Not on file  Social History Narrative   Not on file   Social Determinants of Health   Financial Resource Strain: Not on file  Food Insecurity: Not on file  Transportation Needs: Not on file  Physical Activity: Not on file  Stress: Not on file  Social Connections: Not on file   Allergies  Allergen Reactions  Ibuprofen Other (See Comments)    Gastritis    Shellfish Allergy Other (See Comments)    Abdominal pain and Diarrhea   Colchicine     diarrhea   Astroglide [Gyne-Moistrin] Other (See Comments)    Itching and burning   Demerol Nausea And Vomiting   Penicillins Hives and Other (See Comments)    Face turned red and had a headache. Has patient had a PCN reaction causing immediate rash, facial/tongue/throat swelling, SOB or lightheadedness with hypotension: no Has patient had a PCN reaction causing severe rash involving mucus membranes or skin necrosis: no Has patient had a PCN reaction that required hospitalization: no Has patient had a PCN reaction occurring within the last 10 years: no If all of the above answers are "NO", then may proceed with Cephalosporin use.   Family History  Problem Relation Age of Onset   Hypertension Father    Heart disease  Father 69       chf   Heart disease Mother 92       CAD   Hypertension Other    Asthma Neg Hx     Current Outpatient Medications (Endocrine & Metabolic):    levothyroxine (SYNTHROID, LEVOTHROID) 25 MCG tablet, Take 25-50 mcg by mouth daily. She takes one tablet daily 4 days per week (Monday-Thursday) and two tablets daily 3 days per week (Friday-Sunday).   liothyronine (CYTOMEL) 5 MCG tablet, Take 5-10 mcg by mouth 2 (two) times daily. She takes one tablet in the morning and two tablets midday.   zoledronic acid (RECLAST) 5 MG/100ML SOLN injection, Inject 5 mg into the vein. Once a year  Current Outpatient Medications (Cardiovascular):    EPIPEN 2-PAK 0.3 MG/0.3ML DEVI, Inject 0.3 mg into the muscle once. Reported on 09/03/2015  Current Outpatient Medications (Respiratory):    albuterol (VENTOLIN HFA) 108 (90 Base) MCG/ACT inhaler, Inhale 2 puffs into the lungs every 6 (six) hours as needed for wheezing or shortness of breath.   cetirizine (ZYRTEC) 5 MG tablet, Take 5 mg by mouth 2 (two) times daily as needed for allergies.   fluticasone (FLONASE) 50 MCG/ACT nasal spray, Place 1 spray into the nose daily as needed for allergies. Reported on 08/06/2015   fluticasone furoate-vilanterol (BREO ELLIPTA) 100-25 MCG/ACT AEPB,    IPRATROPIUM BROMIDE HFA IN,     Current Outpatient Medications (Other):    Calcium Citrate-Vitamin D (CALCIUM CITRATE +D PO), Take 2 tablets by mouth daily.    Diclofenac Sodium 2 % SOLN, Place 2 g onto the skin 2 (two) times daily.   docusate sodium (COLACE) 100 MG capsule, Take 100 mg by mouth 2 (two) times daily.   estradiol (ESTRACE) 0.1 MG/GM vaginal cream, PLACE 1 APPLICATORFUL VAGINALLY 3 (THREE) TIMES A WEEK   famotidine (PEPCID) 40 MG tablet, TAKE 1 TABLET BY MOUTH EVERY DAY   MELATONIN-CHAMOMILE PO, Take 1 capsule by mouth at bedtime as needed and may repeat dose one time if needed (For sleep.).    METRONIDAZOLE, TOPICAL, 0.75 % LOTN, Apply 1 application  topically at bedtime.   minoxidil (ROGAINE) 2 % external solution, Apply 1 application topically 4 (four) times a week.    Multiple Vitamin (MULTIVITAMIN WITH MINERALS) TABS tablet, Take 1 tablet by mouth daily.   OVER THE COUNTER MEDICATION, Lion's Mane Extract 250 mg. 1 capsuleby mouth daily   OVER THE COUNTER MEDICATION, Oreganol 140 mg by mouth daily.   Pancrelipase, Lip-Prot-Amyl, (ZENPEP) 40000-126000 units CPEP, 1 po qid ac   pyridoxine (B-6) 200 MG  tablet, Take 200 mg by mouth daily.   venlafaxine XR (EFFEXOR-XR) 37.5 MG 24 hr capsule, TAKE 1 CAPSULE BY MOUTH EVERY DAY WITH BREAKFAST (Patient taking differently: Take 37.5 mg by mouth. 3 times a week)    Review of Systems:  No headache, visual changes, nausea, vomiting, diarrhea, constipation, dizziness, abdominal pain, skin rash, fevers, chills, night sweats, weight loss, swollen lymph nodes, body aches, joint swelling, chest pain, shortness of breath, mood changes. POSITIVE muscle aches  Objective  Blood pressure 108/72, pulse 92, height '5\' 4"'$  (1.626 m), weight 141 lb (64 kg), SpO2 98 %.   General: No apparent distress alert and oriented x3 mood and affect normal, dressed appropriately.  HEENT: Pupils equal, extraocular movements intact  Respiratory: Patient's speak in full sentences and does not appear short of breath  Cardiovascular: No lower extremity edema, non tender, no erythema  Gait severely antalgic gait noted. MSK: Right knee tender to palpation mostly over the lateral joint line more than the medial.  Mild pain in the popliteal area as well.  Patient does have full flexion and extension but severely positive McMurray's.  Limited muscular skeletal ultrasound was performed and interpreted by Hulan Saas, M  Limited ultrasound shows patient does have mild hypoechoic changes noted of the patellofemoral joint.  Medial meniscus appears to be unremarkable.  Lateral meniscus though does have what appears to be an acute on  chronic tear noted.  Patient also has an abnormality that appears to be more of a varicose vein that is fully compressible over the fibular head.  No true masses appreciated. Impression: Lateral meniscal tear  After informed written and verbal consent, patient was seated on exam table. Right knee was prepped with alcohol swab and utilizing anterolateral approach, patient's right knee space was injected with 4:1  marcaine 0.5%: Kenalog '40mg'$ /dL. Patient tolerated the procedure well without immediate complications.    Impression and Recommendations:     The above documentation has been reviewed and is accurate and complete Lyndal Pulley, DO

## 2021-11-29 ENCOUNTER — Ambulatory Visit (INDEPENDENT_AMBULATORY_CARE_PROVIDER_SITE_OTHER): Payer: PPO | Admitting: Family Medicine

## 2021-11-29 ENCOUNTER — Ambulatory Visit (INDEPENDENT_AMBULATORY_CARE_PROVIDER_SITE_OTHER): Payer: PPO

## 2021-11-29 ENCOUNTER — Ambulatory Visit: Payer: Self-pay

## 2021-11-29 ENCOUNTER — Encounter: Payer: Self-pay | Admitting: Family Medicine

## 2021-11-29 VITALS — BP 108/72 | HR 92 | Ht 64.0 in | Wt 141.0 lb

## 2021-11-29 DIAGNOSIS — M25561 Pain in right knee: Secondary | ICD-10-CM | POA: Diagnosis not present

## 2021-11-29 DIAGNOSIS — S83281A Other tear of lateral meniscus, current injury, right knee, initial encounter: Secondary | ICD-10-CM

## 2021-11-29 IMAGING — DX DG KNEE AP/LAT W/ SUNRISE*R*
3 series · 3 of 3 positions shown · non-contrast
Comparison: None Available.

CLINICAL DATA: Knee pain for 2 weeks

EXAM:
RIGHT KNEE 3 VIEWS

[knee ap]
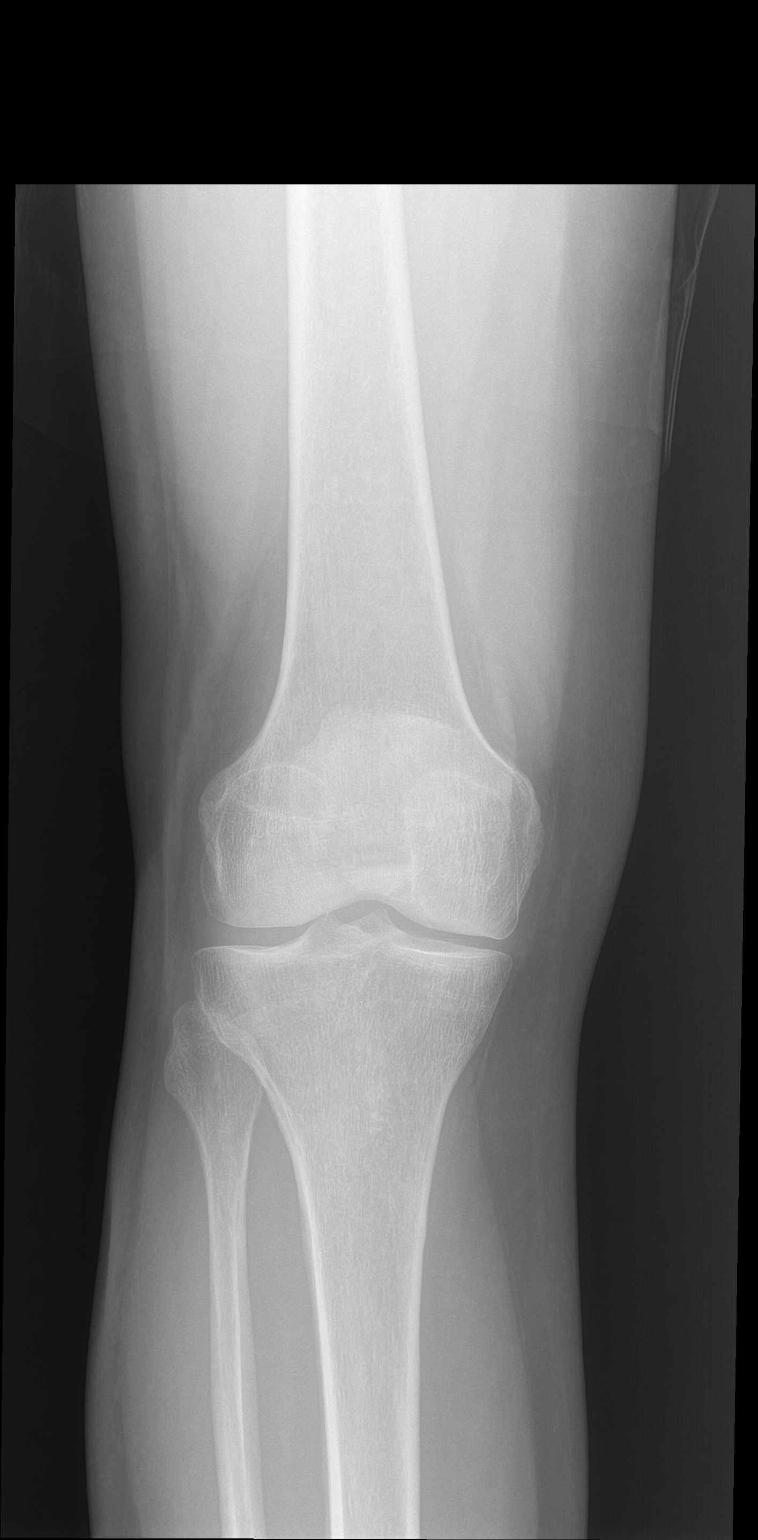

[knee lat]
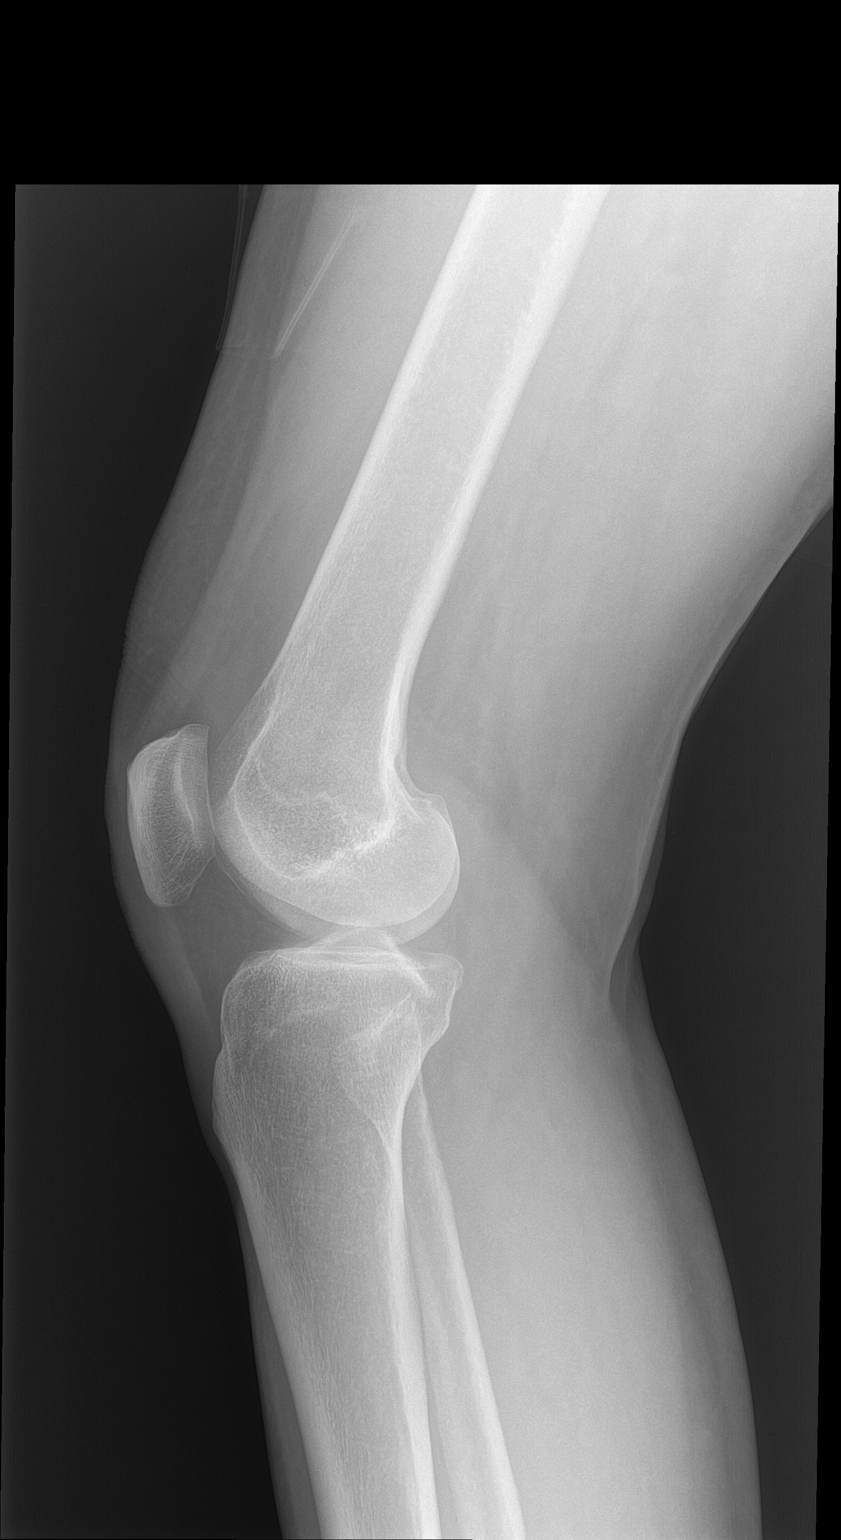

[patella]
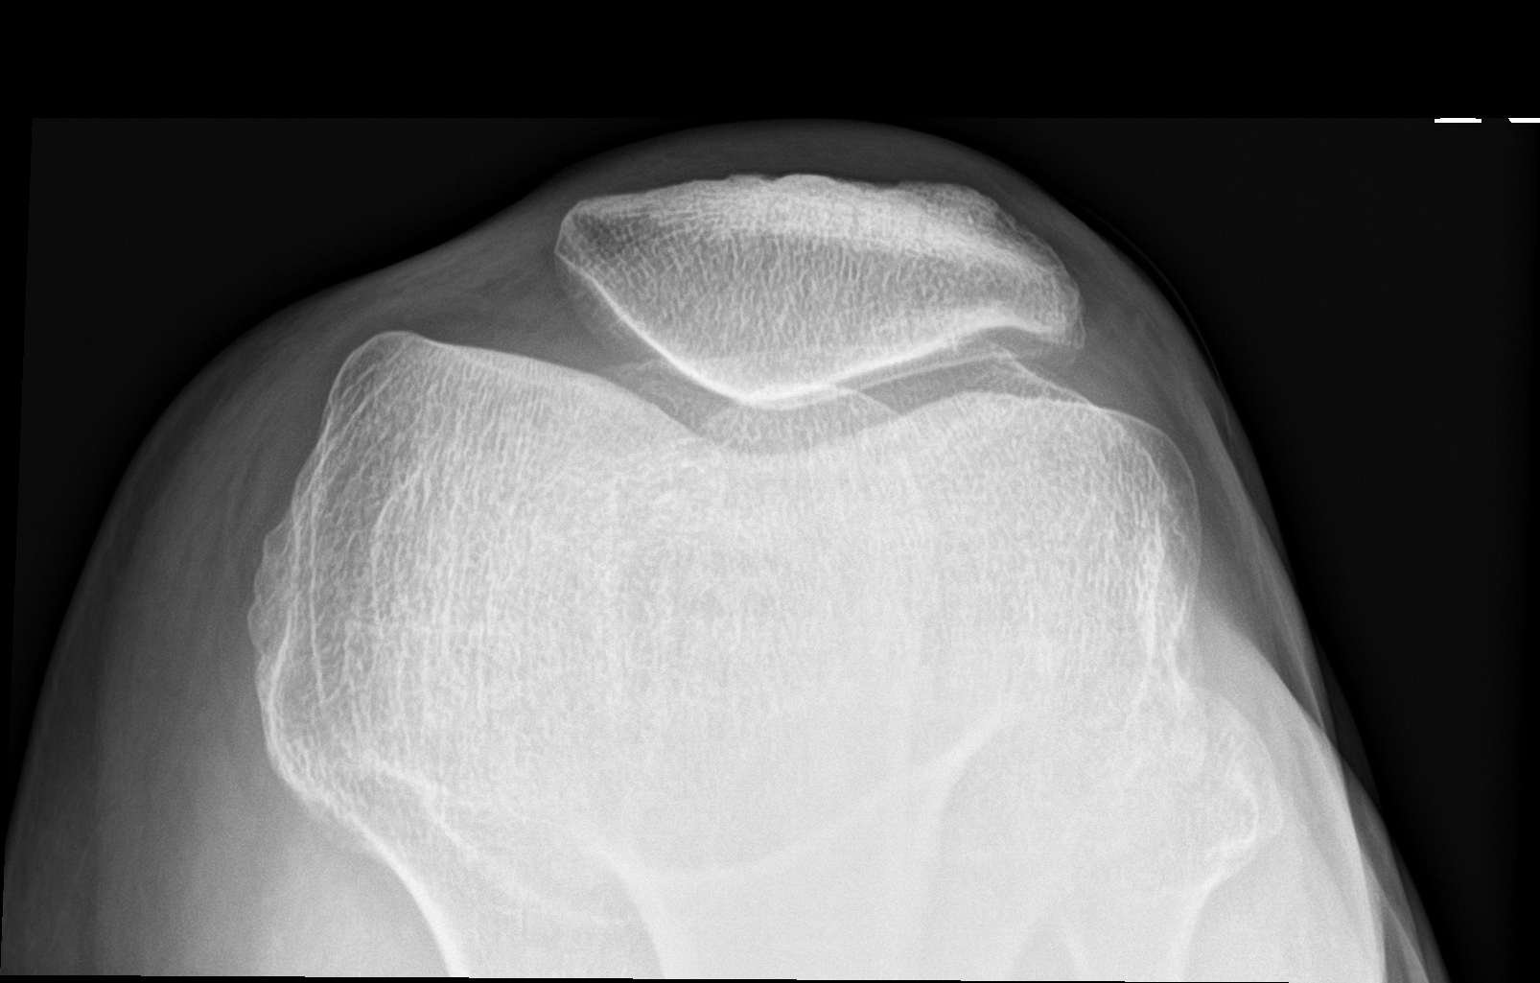

[3 of 3 positions shown; findings below may reference images not displayed]

FINDINGS: No evidence of fracture, dislocation, or joint effusion. No evidence
of arthropathy or other focal bone abnormality. Soft tissues are
unremarkable.
IMPRESSION: Negative.

## 2021-11-29 NOTE — Assessment & Plan Note (Signed)
Patient given injection today.  Hopefully this will decrease the effusion in the joint and allow for the meniscus to potentially heal.  Discussed with patient avoiding this type of twisting motions, continue compression, x-rays pending to make sure there is no other avulsion.  No instability of the ACL noted.  Follow-up again in 4 to 6 weeks

## 2021-11-29 NOTE — Patient Instructions (Signed)
Exercises Voltaren gel Ice Injected knee today See me in 4 weeks

## 2021-12-02 DIAGNOSIS — J301 Allergic rhinitis due to pollen: Secondary | ICD-10-CM | POA: Diagnosis not present

## 2021-12-02 DIAGNOSIS — J3089 Other allergic rhinitis: Secondary | ICD-10-CM | POA: Diagnosis not present

## 2021-12-02 DIAGNOSIS — J3081 Allergic rhinitis due to animal (cat) (dog) hair and dander: Secondary | ICD-10-CM | POA: Diagnosis not present

## 2021-12-10 DIAGNOSIS — J3089 Other allergic rhinitis: Secondary | ICD-10-CM | POA: Diagnosis not present

## 2021-12-10 DIAGNOSIS — J3081 Allergic rhinitis due to animal (cat) (dog) hair and dander: Secondary | ICD-10-CM | POA: Diagnosis not present

## 2021-12-10 DIAGNOSIS — J301 Allergic rhinitis due to pollen: Secondary | ICD-10-CM | POA: Diagnosis not present

## 2021-12-17 DIAGNOSIS — E8941 Symptomatic postprocedural ovarian failure: Secondary | ICD-10-CM | POA: Diagnosis not present

## 2021-12-17 DIAGNOSIS — Z01419 Encounter for gynecological examination (general) (routine) without abnormal findings: Secondary | ICD-10-CM | POA: Diagnosis not present

## 2021-12-17 DIAGNOSIS — Z6823 Body mass index (BMI) 23.0-23.9, adult: Secondary | ICD-10-CM | POA: Diagnosis not present

## 2021-12-17 DIAGNOSIS — Z1272 Encounter for screening for malignant neoplasm of vagina: Secondary | ICD-10-CM | POA: Diagnosis not present

## 2021-12-19 DIAGNOSIS — J301 Allergic rhinitis due to pollen: Secondary | ICD-10-CM | POA: Diagnosis not present

## 2021-12-19 DIAGNOSIS — J3089 Other allergic rhinitis: Secondary | ICD-10-CM | POA: Diagnosis not present

## 2021-12-20 ENCOUNTER — Encounter: Payer: Self-pay | Admitting: Family Medicine

## 2021-12-26 NOTE — Progress Notes (Signed)
[place] Phone: [phone] Subjective:   [name], am serving as a scribe for Dr. Hulan Saas.   I'm seeing this patient by the request  of:  Plotnikov, [name], MD  CC: Right knee pain follow-up  [id number]  11/29/2021 Patient given injection today.  Hopefully this will decrease the effusion in the joint and allow for the meniscus to potentially heal.  Discussed with patient avoiding this type of twisting motions, continue compression, x-rays pending to make sure there is no other avulsion.  No instability of the ACL noted.  Follow-up again in 4 to 6 weeks  Update 12/27/2021 AMIERA HERZBERG is a 66 y.o. female coming in with complaint of R knee pain. Patient states that she had 2 weeks with a decrease in her symptoms. Pain started over medial aspect. Has noticed a few clicks and some instability. Using brace, ice and Pennsaid. Continues to be active using activity modification.      Past Medical History:  Diagnosis Date   ALLERGIC RHINITIS 03/02/2007   Anemia    ANXIETY 03/02/2007   Arthritis    ASTHMA 11/12/2007   Dr Orvil Feil   Cancer [facility])    skin, hx of   Dysrhythmia    pt. states at night will notice a different heart rhythem   Endometrial cancer ([place])    [place], HX OF 03/02/2007   GERD 03/02/2007   HYPOTHYROIDISM 11/12/2007   Dr Chalmers Cater   Neuromuscular disorder ([place])    carpel tunnel syndrome, compression of ulner nerve at elbows   OSTEOPENIA 11/12/2007   PEPTIC ULCER DISEASE 03/02/2007   Pneumonia    hx. of   Radiation 09/27/15-10/23/15   HDR to vaginal cuff 30 Gy   Stress fracture    TMJ SYNDROME 11/12/2007   Past Surgical History:  Procedure Laterality Date   2008 TMJ surgery      CARPAL TUNNEL WITH CUBITAL TUNNEL Left 07/2019   carpel tunnel Right 11/20/2016   colonoscopy x 2     cubital tunnel release Right 11/20/2016   deviated septum surgery - 2007     DILATION  AND CURETTAGE OF UTERUS     2017   endoscopy - 1990     Mandib advancement forward  2009   ROBOTIC ASSISTED TOTAL HYSTERECTOMY WITH BILATERAL SALPINGO OOPHERECTOMY Bilateral 08/14/2015   Procedure: XI ROBOTIC ASSISTED TOTAL HYSTERECTOMY WITH BILATERAL SALPINGO OOPHORECTOMY AND SENTAL LYMPH NODE BIOPSY;  Surgeon: Everitt Amber, MD;  Location: [facility];  Service: Gynecology;  Laterality: Bilateral;   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     Social History   Socioeconomic History   Marital status: Married    Spouse name: Not on file   Number of children: Not on file   Years of education: Not on file   Highest education level: Not on file  Occupational History   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[employer]  Tobacco Use   Smoking status: Never   Smokeless tobacco: Never   Tobacco comments:    Married, 1 child  Vaping Use   Vaping Use: Never used  Substance and Sexual Activity   Alcohol use: Yes    Alcohol/week: 2.0 standard drinks of alcohol    Types: 1 Glasses of wine, 1 Cans of beer per week    Comment: daily   Drug use: No   Sexual activity: Yes  Other Topics Concern   Not on file  Social History Narrative   Not on file   Social Determinants of Health   Financial Resource Strain: Not on file  Food Insecurity: Not on file  Transportation Needs: Not on file  Physical Activity: Not on file  Stress: Not on file  Social Connections: Not on file   Allergies  Allergen Reactions   Ibuprofen Other (See Comments)    Gastritis    Shellfish Allergy Other (See Comments)    Abdominal pain and Diarrhea   Colchicine     diarrhea   Astroglide [Gyne-Moistrin] Other (See Comments)    Itching and burning   Demerol Nausea And Vomiting   Penicillins Hives and Other (See Comments)    Face turned red and had a headache. Has patient had a PCN reaction causing immediate rash, facial/tongue/throat swelling, SOB or lightheadedness with hypotension: no Has patient had a PCN  reaction causing severe rash involving mucus membranes or skin necrosis: no Has patient had a PCN reaction that required hospitalization: no Has patient had a PCN reaction occurring within the last 10 years: no If all of the above answers are "NO", then may proceed with Cephalosporin use.   Family History  Problem Relation Age of Onset   Hypertension Father    Heart disease Father 34       chf   Heart disease Mother 32       CAD   Hypertension Other    Asthma Neg Hx     Current Outpatient Medications (Endocrine & Metabolic):    levothyroxine (SYNTHROID, LEVOTHROID) 25 MCG tablet, Take 25-50 mcg by mouth daily. She takes one tablet daily 4 days per week (Monday-Thursday) and two tablets daily 3 days per week (Friday-Sunday).   liothyronine (CYTOMEL) 5 MCG tablet, Take 5-10 mcg by mouth 2 (two) times daily. She takes one tablet in the morning and two tablets midday.   zoledronic acid (RECLAST) 5 MG/100ML SOLN injection, Inject 5 mg into the vein. Once a year  Current Outpatient Medications (Cardiovascular):    EPIPEN 2-PAK 0.3 MG/0.3ML DEVI, Inject 0.3 mg into the muscle once. Reported on 09/03/2015  Current Outpatient Medications (Respiratory):    albuterol (VENTOLIN HFA) 108 (90 Base) MCG/ACT inhaler, Inhale 2 puffs into the lungs every 6 (six) hours as needed for wheezing or shortness of breath.   cetirizine (ZYRTEC) 5 MG tablet, Take 5 mg by mouth 2 (two) times daily as needed for allergies.   fluticasone (FLONASE) 50 MCG/ACT nasal spray, Place 1 spray into the nose daily as needed for allergies. Reported on 08/06/2015   fluticasone furoate-vilanterol (BREO ELLIPTA) 100-25 MCG/ACT AEPB,    IPRATROPIUM BROMIDE HFA IN,     Current Outpatient Medications (Other):    Calcium Citrate-Vitamin D (CALCIUM CITRATE +D PO), Take 2 tablets by mouth daily.    Diclofenac Sodium 2 % SOLN, Place 2 g onto the skin 2 (two) times daily.   docusate sodium (COLACE) 100 MG capsule, Take 100 mg by mouth 2  (two) times daily.   estradiol (ESTRACE) 0.1 MG/GM vaginal cream, PLACE 1 APPLICATORFUL VAGINALLY 3 (THREE) TIMES A WEEK   famotidine (PEPCID) 40 MG tablet, TAKE 1 TABLET BY MOUTH EVERY DAY   MELATONIN-CHAMOMILE PO, Take 1 capsule by mouth at bedtime as needed and may repeat dose one time if needed (For sleep.).    METRONIDAZOLE, TOPICAL, 0.75 % LOTN, Apply 1 application topically at bedtime.   minoxidil (ROGAINE) 2 % external solution, Apply 1 application topically 4 (four) times a week.  Multiple Vitamin (MULTIVITAMIN WITH MINERALS) TABS tablet, Take 1 tablet by mouth daily.   OVER THE COUNTER MEDICATION, Lion's Mane Extract 250 mg. 1 capsuleby mouth daily   OVER THE COUNTER MEDICATION, Oreganol 140 mg by mouth daily.   Pancrelipase, Lip-Prot-Amyl, (ZENPEP) 40000-126000 units CPEP, 1 po qid ac   pyridoxine (B-6) 200 MG tablet, Take 200 mg by mouth daily.   venlafaxine XR (EFFEXOR-XR) 37.5 MG 24 hr capsule, TAKE 1 CAPSULE BY MOUTH EVERY DAY WITH BREAKFAST (Patient taking differently: Take 37.5 mg by mouth. 3 times a week)   Reviewed prior external information including notes and imaging from  primary care provider As well as notes that were available from care everywhere and other healthcare systems.  Past medical history, social, surgical and family history all reviewed in electronic medical record.  No pertanent information unless stated regarding to the chief complaint.   Review of Systems:  No headache, visual changes, nausea, vomiting, diarrhea, constipation, dizziness, abdominal pain, skin rash, fevers, chills, night sweats, weight loss, swollen lymph nodes, body aches, joint swelling, chest pain, shortness of breath, mood changes. POSITIVE muscle aches  Objective  Blood pressure 118/82, pulse 80, height '5\' 4"'$  (1.626 m), weight 137 lb (62.1 kg), SpO2 98 %.   General: No apparent distress alert and oriented x3 mood and affect normal, dressed appropriately.  HEENT: Pupils equal,  extraocular movements intact  Respiratory: Patient's speak in full sentences and does not appear short of breath  Cardiovascular: No lower extremity edema, non tender, no erythema  Right knee exam shows there is significant improvement in the effusion the patient had previously.  Still mildly tender to palpation.  Still some pain with McMurray's.  Limited muscular skeletal ultrasound was performed and interpreted by Hulan Saas, M  Limited ultrasound of patient's right knee still shows the patient does have what appears to be a full oblique tear noted of the lateral meniscus.  Patient does not have a significant displacement at the moment which is significantly proved from previous exam.  No hypoechoic changes that is consistent with the effusion that was noted previously.  Medial joint line no significant abnormal findings present. Impression consistent with a lateral meniscal tear but interval improvement in placement   Impression and Recommendations:      The above documentation has been reviewed and is accurate and complete Lyndal Pulley, DO

## 2021-12-27 ENCOUNTER — Ambulatory Visit (INDEPENDENT_AMBULATORY_CARE_PROVIDER_SITE_OTHER): Payer: PPO | Admitting: Family Medicine

## 2021-12-27 ENCOUNTER — Ambulatory Visit: Payer: Self-pay

## 2021-12-27 ENCOUNTER — Encounter: Payer: Self-pay | Admitting: Family Medicine

## 2021-12-27 VITALS — BP 118/82 | HR 80 | Ht 64.0 in | Wt 137.0 lb

## 2021-12-27 DIAGNOSIS — M25561 Pain in right knee: Secondary | ICD-10-CM

## 2021-12-27 DIAGNOSIS — G8929 Other chronic pain: Secondary | ICD-10-CM

## 2021-12-27 DIAGNOSIS — S83281A Other tear of lateral meniscus, current injury, right knee, initial encounter: Secondary | ICD-10-CM | POA: Diagnosis not present

## 2021-12-27 NOTE — Assessment & Plan Note (Signed)
Significant improvement in the swelling noted.  Patient is having pain more on the medial differential includes the possibility of a bucket-handle and will need advanced imaging if any locking or giving out on her.  Patient encouraged to continue with conservative therapy otherwise and follow-up again in 6 to 8 weeks

## 2021-12-27 NOTE — Patient Instructions (Signed)
Good to see you Still have abnormality of lateral meniscus Will scar down Avoid twisting but ok to increase activity  Avoid flexion and extension See me in 6-8 weeks

## 2022-01-01 DIAGNOSIS — J301 Allergic rhinitis due to pollen: Secondary | ICD-10-CM | POA: Diagnosis not present

## 2022-01-01 DIAGNOSIS — J3081 Allergic rhinitis due to animal (cat) (dog) hair and dander: Secondary | ICD-10-CM | POA: Diagnosis not present

## 2022-01-01 DIAGNOSIS — J3089 Other allergic rhinitis: Secondary | ICD-10-CM | POA: Diagnosis not present

## 2022-01-06 DIAGNOSIS — J301 Allergic rhinitis due to pollen: Secondary | ICD-10-CM | POA: Diagnosis not present

## 2022-01-06 DIAGNOSIS — J3089 Other allergic rhinitis: Secondary | ICD-10-CM | POA: Diagnosis not present

## 2022-01-06 DIAGNOSIS — J3081 Allergic rhinitis due to animal (cat) (dog) hair and dander: Secondary | ICD-10-CM | POA: Diagnosis not present

## 2022-01-13 ENCOUNTER — Ambulatory Visit (INDEPENDENT_AMBULATORY_CARE_PROVIDER_SITE_OTHER): Payer: PPO | Admitting: Family Medicine

## 2022-01-13 ENCOUNTER — Ambulatory Visit (INDEPENDENT_AMBULATORY_CARE_PROVIDER_SITE_OTHER): Payer: BC Managed Care – PPO

## 2022-01-13 VITALS — BP 116/70 | HR 67 | Ht 64.0 in | Wt 138.0 lb

## 2022-01-13 DIAGNOSIS — M25552 Pain in left hip: Secondary | ICD-10-CM

## 2022-01-13 DIAGNOSIS — M5442 Lumbago with sciatica, left side: Secondary | ICD-10-CM | POA: Diagnosis not present

## 2022-01-13 DIAGNOSIS — G8929 Other chronic pain: Secondary | ICD-10-CM | POA: Diagnosis not present

## 2022-01-13 DIAGNOSIS — M533 Sacrococcygeal disorders, not elsewhere classified: Secondary | ICD-10-CM | POA: Diagnosis not present

## 2022-01-13 DIAGNOSIS — M545 Low back pain, unspecified: Secondary | ICD-10-CM | POA: Diagnosis not present

## 2022-01-13 MED ORDER — TRAMADOL HCL 50 MG PO TABS: 50.0000 mg | ORAL_TABLET | Freq: Two times a day (BID) | ORAL | 0 refills | Status: AC | PRN

## 2022-01-13 NOTE — Assessment & Plan Note (Signed)
Acute on chronic.  Patient does have bruising noted.  Discussed with her to watch out for any type of hematuria.  Discussed with patient about posture and ergonomics otherwise.  Patient is going to increase activity.  If any worsening symptoms to seek medical attention immediately.  I do believe the patient should do well though.  Follow-up again in 6 to 8 weeks

## 2022-01-13 NOTE — Patient Instructions (Signed)
Great to see you  Ice 20 minutes 2 times daily. Usually after activity and before bed. Tramadol with a tylenol up to 2 times a day for 5 days  Miralax 17 grams with '100mg'$  of colace daily for 1 week If worsening pain please seek medical attention See me again as needed

## 2022-01-13 NOTE — Progress Notes (Signed)
[place] Phone: [phone] Subjective:    I'm seeing this patient by the request  of:  Plotnikov, [name], MD  CC: Low back and left hip pain  [id number]  12/27/2021 Significant improvement in the swelling noted.  Patient is having pain more on the medial differential includes the possibility of a bucket-handle and will need advanced imaging if any locking or giving out on her.  Patient encouraged to continue with conservative therapy otherwise and follow-up again in 6 to 8 weeks  Updated 01/13/2022 [name] is a 66 y.o. female coming in with complaint of back and hip pain. Fell 2 days ago going up steps, fell on left hip. Sharp pain when movement. Dull constant pain. Twisting  and hip flexion like getting into car or bed produces the most pain. Has been taking tylenol and using ice. Cannot go into back flexion much. Extension is ok.  Patient did have x-rays of the lumbar spine independently visualized by me showing no significant changes to previous exam except for severe constipation noted.     Past Medical History:  Diagnosis Date   ALLERGIC RHINITIS 03/02/2007   Anemia    ANXIETY 03/02/2007   Arthritis    ASTHMA 11/12/2007   Dr Orvil Feil   Cancer [facility])    skin, hx of   Dysrhythmia    pt. states at night will notice a different heart rhythem   Endometrial cancer ([place])    [place], HX OF 03/02/2007   GERD 03/02/2007   HYPOTHYROIDISM 11/12/2007   Dr Chalmers Cater   Neuromuscular disorder ([place])    carpel tunnel syndrome, compression of ulner nerve at elbows   OSTEOPENIA 11/12/2007   PEPTIC ULCER DISEASE 03/02/2007   Pneumonia    hx. of   Radiation 09/27/15-10/23/15   HDR to vaginal cuff 30 Gy   Stress fracture    TMJ SYNDROME 11/12/2007   Past Surgical History:  Procedure Laterality Date   2008 TMJ surgery      CARPAL TUNNEL WITH CUBITAL TUNNEL Left 07/2019   carpel tunnel Right 11/20/2016    colonoscopy x 2     cubital tunnel release Right 11/20/2016   deviated septum surgery - 2007     DILATION AND CURETTAGE OF UTERUS     2017   endoscopy - 1990     Mandib advancement forward  2009   ROBOTIC ASSISTED TOTAL HYSTERECTOMY WITH BILATERAL SALPINGO OOPHERECTOMY Bilateral 08/14/2015   Procedure: XI ROBOTIC ASSISTED TOTAL HYSTERECTOMY WITH BILATERAL SALPINGO OOPHORECTOMY AND SENTAL LYMPH NODE BIOPSY;  Surgeon: Everitt Amber, MD;  Location: [facility];  Service: Gynecology;  Laterality: Bilateral;   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     Social History   Socioeconomic History   Marital status: Married    Spouse name: Not on file   Number of children: Not on file   Years of education: Not on file   Highest education level: Not on file  Occupational History   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[employer]  Tobacco Use   Smoking status: Never   Smokeless tobacco: Never   Tobacco comments:    Married, 1 child  Vaping Use   Vaping Use: Never used  Substance and Sexual Activity   Alcohol use: Yes    Alcohol/week: 2.0 standard drinks of alcohol    Types: 1 Glasses of wine, 1 Cans of beer per week    Comment: daily  Drug use: No   Sexual activity: Yes  Other Topics Concern   Not on file  Social History Narrative   Not on file   Social Determinants of Health   Financial Resource Strain: Not on file  Food Insecurity: Not on file  Transportation Needs: Not on file  Physical Activity: Not on file  Stress: Not on file  Social Connections: Not on file   Allergies  Allergen Reactions   Ibuprofen Other (See Comments)    Gastritis    Shellfish Allergy Other (See Comments)    Abdominal pain and Diarrhea   Colchicine     diarrhea   Astroglide [Gyne-Moistrin] Other (See Comments)    Itching and burning   Demerol Nausea And Vomiting   Penicillins Hives and Other (See Comments)    Face turned red and had a headache. Has patient had a PCN reaction causing  immediate rash, facial/tongue/throat swelling, SOB or lightheadedness with hypotension: no Has patient had a PCN reaction causing severe rash involving mucus membranes or skin necrosis: no Has patient had a PCN reaction that required hospitalization: no Has patient had a PCN reaction occurring within the last 10 years: no If all of the above answers are "NO", then may proceed with Cephalosporin use.   Family History  Problem Relation Age of Onset   Hypertension Father    Heart disease Father 60       chf   Heart disease Mother 24       CAD   Hypertension Other    Asthma Neg Hx     Current Outpatient Medications (Endocrine & Metabolic):    levothyroxine (SYNTHROID, LEVOTHROID) 25 MCG tablet, Take 25-50 mcg by mouth daily. She takes one tablet daily 4 days per week (Monday-Thursday) and two tablets daily 3 days per week (Friday-Sunday).   liothyronine (CYTOMEL) 5 MCG tablet, Take 5-10 mcg by mouth 2 (two) times daily. She takes one tablet in the morning and two tablets midday.   zoledronic acid (RECLAST) 5 MG/100ML SOLN injection, Inject 5 mg into the vein. Once a year  Current Outpatient Medications (Cardiovascular):    EPIPEN 2-PAK 0.3 MG/0.3ML DEVI, Inject 0.3 mg into the muscle once. Reported on 09/03/2015  Current Outpatient Medications (Respiratory):    albuterol (VENTOLIN HFA) 108 (90 Base) MCG/ACT inhaler, Inhale 2 puffs into the lungs every 6 (six) hours as needed for wheezing or shortness of breath.   cetirizine (ZYRTEC) 5 MG tablet, Take 5 mg by mouth 2 (two) times daily as needed for allergies.   fluticasone (FLONASE) 50 MCG/ACT nasal spray, Place 1 spray into the nose daily as needed for allergies. Reported on 08/06/2015   fluticasone furoate-vilanterol (BREO ELLIPTA) 100-25 MCG/ACT AEPB,    IPRATROPIUM BROMIDE HFA IN,   Current Outpatient Medications (Analgesics):    traMADol (ULTRAM) 50 MG tablet, Take 1 tablet (50 mg total) by mouth every 12 (twelve) hours as needed for up  to 5 days.   Current Outpatient Medications (Other):    Calcium Citrate-Vitamin D (CALCIUM CITRATE +D PO), Take 2 tablets by mouth daily.    Diclofenac Sodium 2 % SOLN, Place 2 g onto the skin 2 (two) times daily.   docusate sodium (COLACE) 100 MG capsule, Take 100 mg by mouth 2 (two) times daily.   estradiol (ESTRACE) 0.1 MG/GM vaginal cream, PLACE 1 APPLICATORFUL VAGINALLY 3 (THREE) TIMES A WEEK   famotidine (PEPCID) 40 MG tablet, TAKE 1 TABLET BY MOUTH EVERY DAY   MELATONIN-CHAMOMILE PO, Take 1 capsule  by mouth at bedtime as needed and may repeat dose one time if needed (For sleep.).    METRONIDAZOLE, TOPICAL, 0.75 % LOTN, Apply 1 application topically at bedtime.   minoxidil (ROGAINE) 2 % external solution, Apply 1 application topically 4 (four) times a week.    Multiple Vitamin (MULTIVITAMIN WITH MINERALS) TABS tablet, Take 1 tablet by mouth daily.   OVER THE COUNTER MEDICATION, Lion's Mane Extract 250 mg. 1 capsuleby mouth daily   OVER THE COUNTER MEDICATION, Oreganol 140 mg by mouth daily.   Pancrelipase, Lip-Prot-Amyl, (ZENPEP) 40000-126000 units CPEP, 1 po qid ac   pyridoxine (B-6) 200 MG tablet, Take 200 mg by mouth daily.   venlafaxine XR (EFFEXOR-XR) 37.5 MG 24 hr capsule, TAKE 1 CAPSULE BY MOUTH EVERY DAY WITH BREAKFAST (Patient taking differently: Take 37.5 mg by mouth. 3 times a week)   Reviewed prior external information including notes and imaging from  primary care provider As well as notes that were available from care everywhere and other healthcare systems.  Past medical history, social, surgical and family history all reviewed in electronic medical record.  No pertanent information unless stated regarding to the chief complaint.   Review of Systems:  No headache, visual changes, nausea, vomiting, diarrhea, constipation, dizziness, abdominal pain, skin rash, fevers, chills, night sweats, weight loss, swollen lymph nodes, joint swelling, chest pain, shortness of breath,  mood changes. POSITIVE muscle aches, body aches   Objective  Blood pressure 116/70, pulse 67, height '5\' 4"'$  (1.626 m), weight 138 lb (62.6 kg), SpO2 97 %.   General: No apparent distress alert and oriented x3 mood and affect normal, dressed appropriately.  HEENT: Pupils equal, extraocular movements intact  Respiratory: Patient's speak in full sentences and does not appear short of breath  Cardiovascular: No lower extremity edema, non tender, no erythema  Patient is moving extremely slowly and cautiously.  Patient does have bruising of the paraspinal musculature of the lumbar spine from L3-L5 on the left side.  Mild midline tenderness noted of the lumbar spine.  Patient does have voluntary guarding noted.  Tightness noted with FABER test.  Negative straight leg test are noted.  Neurovascular intact distally.  Difficult to do range of motion testing secondary to guarding.    Impression and Recommendations:  The above documentation has been reviewed and is accurate and complete Lyndal Pulley, DO

## 2022-01-14 DIAGNOSIS — J301 Allergic rhinitis due to pollen: Secondary | ICD-10-CM | POA: Diagnosis not present

## 2022-01-14 DIAGNOSIS — J3089 Other allergic rhinitis: Secondary | ICD-10-CM | POA: Diagnosis not present

## 2022-01-14 DIAGNOSIS — J3081 Allergic rhinitis due to animal (cat) (dog) hair and dander: Secondary | ICD-10-CM | POA: Diagnosis not present

## 2022-01-21 DIAGNOSIS — J3081 Allergic rhinitis due to animal (cat) (dog) hair and dander: Secondary | ICD-10-CM | POA: Diagnosis not present

## 2022-01-21 DIAGNOSIS — J3089 Other allergic rhinitis: Secondary | ICD-10-CM | POA: Diagnosis not present

## 2022-01-21 DIAGNOSIS — J301 Allergic rhinitis due to pollen: Secondary | ICD-10-CM | POA: Diagnosis not present

## 2022-01-30 DIAGNOSIS — J3089 Other allergic rhinitis: Secondary | ICD-10-CM | POA: Diagnosis not present

## 2022-01-30 DIAGNOSIS — J301 Allergic rhinitis due to pollen: Secondary | ICD-10-CM | POA: Diagnosis not present

## 2022-01-30 DIAGNOSIS — J3081 Allergic rhinitis due to animal (cat) (dog) hair and dander: Secondary | ICD-10-CM | POA: Diagnosis not present

## 2022-01-31 DIAGNOSIS — J301 Allergic rhinitis due to pollen: Secondary | ICD-10-CM | POA: Diagnosis not present

## 2022-01-31 DIAGNOSIS — J3089 Other allergic rhinitis: Secondary | ICD-10-CM | POA: Diagnosis not present

## 2022-02-07 DIAGNOSIS — J3089 Other allergic rhinitis: Secondary | ICD-10-CM | POA: Diagnosis not present

## 2022-02-07 DIAGNOSIS — J301 Allergic rhinitis due to pollen: Secondary | ICD-10-CM | POA: Diagnosis not present

## 2022-02-07 DIAGNOSIS — J3081 Allergic rhinitis due to animal (cat) (dog) hair and dander: Secondary | ICD-10-CM | POA: Diagnosis not present

## 2022-02-07 NOTE — Progress Notes (Unsigned)
[place] Phone: [phone] Subjective:   [name], am serving as a scribe for Dr. Hulan Saas.  I'm seeing this patient by the request  of:  Plotnikov, [name], MD  CC: Multiple joint complaints  [id number]  01/13/2022 Acute on chronic.  Patient does have bruising noted.  Discussed with her to watch out for any type of hematuria.  Discussed with patient about posture and ergonomics otherwise.  Patient is going to increase activity.  If any worsening symptoms to seek medical attention immediately.  I do believe the patient should do well though.  Follow-up again in 6 to 8 weeks  Update 02/10/2022 [name] is a 66 y.o. female coming in with complaint of  R knee, L hip and LBP. Had acute meniscal tear prior to last visit in which she had suffered a fall an injured her hip and back. (-) lumbar and pelvis xray last visit. Patient states that her knee is improving but is not as stable as the other. Pain occurring in posterior lateral aspect and sometimes over medial aspect.   Pain in L SI joint seems to be increasing. Rolling over in bed and getting in and out of car.        Past Medical History:  Diagnosis Date   ALLERGIC RHINITIS 03/02/2007   Anemia    ANXIETY 03/02/2007   Arthritis    ASTHMA 11/12/2007   Dr Orvil Feil   Cancer [facility])    skin, hx of   Dysrhythmia    pt. states at night will notice a different heart rhythem   Endometrial cancer ([place])    [place], HX OF 03/02/2007   GERD 03/02/2007   HYPOTHYROIDISM 11/12/2007   Dr Chalmers Cater   Neuromuscular disorder ([place])    carpel tunnel syndrome, compression of ulner nerve at elbows   OSTEOPENIA 11/12/2007   PEPTIC ULCER DISEASE 03/02/2007   Pneumonia    hx. of   Radiation 09/27/15-10/23/15   HDR to vaginal cuff 30 Gy   Stress fracture    TMJ SYNDROME 11/12/2007   Past Surgical History:  Procedure Laterality Date   2008 TMJ surgery       CARPAL TUNNEL WITH CUBITAL TUNNEL Left 07/2019   carpel tunnel Right 11/20/2016   colonoscopy x 2     cubital tunnel release Right 11/20/2016   deviated septum surgery - 2007     DILATION AND CURETTAGE OF UTERUS     2017   endoscopy - 1990     Mandib advancement forward  2009   ROBOTIC ASSISTED TOTAL HYSTERECTOMY WITH BILATERAL SALPINGO OOPHERECTOMY Bilateral 08/14/2015   Procedure: XI ROBOTIC ASSISTED TOTAL HYSTERECTOMY WITH BILATERAL SALPINGO OOPHORECTOMY AND SENTAL LYMPH NODE BIOPSY;  Surgeon: Everitt Amber, MD;  Location: [facility];  Service: Gynecology;  Laterality: Bilateral;   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     Social History   Socioeconomic History   Marital status: Married    Spouse name: Not on file   Number of children: Not on file   Years of education: Not on file   Highest education level: Not on file  Occupational History   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[employer]  Tobacco Use   Smoking status: Never   Smokeless tobacco: Never   Tobacco comments:    Married, 1 child  Vaping Use   Vaping Use: Never used  Substance and Sexual Activity   Alcohol use:  Yes    Alcohol/week: 2.0 standard drinks of alcohol    Types: 1 Glasses of wine, 1 Cans of beer per week    Comment: daily   Drug use: Caldwell   Sexual activity: Yes  Other Topics Concern   Not on file  Social History Narrative   Not on file   Social Determinants of Health   Financial Resource Strain: Not on file  Food Insecurity: Not on file  Transportation Needs: Not on file  Physical Activity: Not on file  Stress: Not on file  Social Connections: Not on file   Allergies  Allergen Reactions   Ibuprofen Other (See Comments)    Gastritis    Shellfish Allergy Other (See Comments)    Abdominal pain and Diarrhea   Colchicine     diarrhea   Astroglide [Gyne-Moistrin] Other (See Comments)    Itching and burning   Demerol Nausea And Vomiting   Penicillins Hives and Other (See Comments)     Face turned red and had a headache. Has patient had a PCN reaction causing immediate rash, facial/tongue/throat swelling, SOB or lightheadedness with hypotension: Caldwell Has patient had a PCN reaction causing severe rash involving mucus membranes or skin necrosis: Caldwell Has patient had a PCN reaction that required hospitalization: Caldwell Has patient had a PCN reaction occurring within the last 10 years: Caldwell If all of the above answers are "Caldwell", then may proceed with Cephalosporin use.   Family History  Problem Relation Age of Onset   Hypertension Father    Heart disease Father 76       chf   Heart disease Mother 53       CAD   Hypertension Other    Asthma Neg Hx     Current Outpatient Medications (Endocrine & Metabolic):    levothyroxine (SYNTHROID, LEVOTHROID) 25 MCG tablet, Take 25-50 mcg by mouth daily. She takes one tablet daily 4 days per week (Monday-Thursday) and two tablets daily 3 days per week (Friday-Sunday).   liothyronine (CYTOMEL) 5 MCG tablet, Take 5-10 mcg by mouth 2 (two) times daily. She takes one tablet in the morning and two tablets midday.   zoledronic acid (RECLAST) 5 MG/100ML SOLN injection, Inject 5 mg into the vein. Once a year  Current Outpatient Medications (Cardiovascular):    EPIPEN 2-PAK 0.3 MG/0.3ML DEVI, Inject 0.3 mg into the muscle once. Reported on 09/03/2015  Current Outpatient Medications (Respiratory):    albuterol (VENTOLIN HFA) 108 (90 Base) MCG/ACT inhaler, Inhale 2 puffs into the lungs every 6 (six) hours as needed for wheezing or shortness of breath.   cetirizine (ZYRTEC) 5 MG tablet, Take 5 mg by mouth 2 (two) times daily as needed for allergies.   fluticasone (FLONASE) 50 MCG/ACT nasal spray, Place 1 spray into the nose daily as needed for allergies. Reported on 08/06/2015   fluticasone furoate-vilanterol (BREO ELLIPTA) 100-25 MCG/ACT AEPB,    IPRATROPIUM BROMIDE HFA IN,     Current Outpatient Medications (Other):    Calcium Citrate-Vitamin D  (CALCIUM CITRATE +D PO), Take 2 tablets by mouth daily.    Diclofenac Sodium 2 % SOLN, Place 2 g onto the skin 2 (two) times daily.   docusate sodium (COLACE) 100 MG capsule, Take 100 mg by mouth 2 (two) times daily.   estradiol (ESTRACE) 0.1 MG/GM vaginal cream, PLACE 1 APPLICATORFUL VAGINALLY 3 (THREE) TIMES A WEEK   famotidine (PEPCID) 40 MG tablet, TAKE 1 TABLET BY MOUTH EVERY DAY   MELATONIN-CHAMOMILE PO, Take 1 capsule  by mouth at bedtime as needed and may repeat dose one time if needed (For sleep.).    METRONIDAZOLE, TOPICAL, 0.75 % LOTN, Apply 1 application topically at bedtime.   minoxidil (ROGAINE) 2 % external solution, Apply 1 application topically 4 (four) times a week.    Multiple Vitamin (MULTIVITAMIN WITH MINERALS) TABS tablet, Take 1 tablet by mouth daily.   OVER THE COUNTER MEDICATION, Lion's Mane Extract 250 mg. 1 capsuleby mouth daily   OVER THE COUNTER MEDICATION, Oreganol 140 mg by mouth daily.   Pancrelipase, Lip-Prot-Amyl, (ZENPEP) 40000-126000 units CPEP, 1 po qid ac   pyridoxine (B-6) 200 MG tablet, Take 200 mg by mouth daily.   venlafaxine XR (EFFEXOR-XR) 37.5 MG 24 hr capsule, TAKE 1 CAPSULE BY MOUTH EVERY DAY WITH BREAKFAST (Patient taking differently: Take 37.5 mg by mouth. 3 times a week)   Reviewed prior external information including notes and imaging from  primary care provider As well as notes that were available from care everywhere and other healthcare systems.  Past medical history, social, surgical and family history all reviewed in electronic medical record.  Caldwell pertanent information unless stated regarding to the chief complaint.   Review of Systems:  Caldwell headache, visual changes, nausea, vomiting, diarrhea, constipation, dizziness, abdominal pain, skin rash, fevers, chills, night sweats, weight loss, swollen lymph nodes, body aches, joint swelling, chest pain, shortness of breath, mood changes. POSITIVE muscle aches  Objective  Blood pressure 122/86,  pulse 66, height '5\' 4"'$  (1.626 m), weight 138 lb (62.6 kg), SpO2 98 %.   General: Caldwell apparent distress alert and oriented x3 mood and affect normal, dressed appropriately.  HEENT: Pupils equal, extraocular movements intact  Respiratory: Patient's speak in full sentences and does not appear short of breath  Cardiovascular: Caldwell lower extremity edema, non tender, Caldwell erythema  Patient does have mild tenderness to palpation over the right knee.  He does have swelling noted still noted.  Good range of motion otherwise.  Patient's low back sitting in the chair seems to be minorly uncomfortable with the sacroiliac joint noted  Limited muscular skeletal ultrasound was performed and interpreted by Hulan Saas, M  Limited ultrasound still shows the patient does have some hypoechoic changes of the meniscus noted.  Lateral displacement noted as well.  Otherwise Caldwell significant breakdown of the joint space. Impression: Caldwell significant change from previous ultrasound hypoechoic changes   Impression and Recommendations:    The above documentation has been reviewed and is accurate and complete Lyndal Pulley, DO

## 2022-02-10 ENCOUNTER — Ambulatory Visit (INDEPENDENT_AMBULATORY_CARE_PROVIDER_SITE_OTHER): Payer: PPO | Admitting: Family Medicine

## 2022-02-10 ENCOUNTER — Encounter: Payer: Self-pay | Admitting: Family Medicine

## 2022-02-10 ENCOUNTER — Ambulatory Visit: Payer: Self-pay

## 2022-02-10 VITALS — BP 122/86 | HR 66 | Ht 64.0 in | Wt 138.0 lb

## 2022-02-10 DIAGNOSIS — M533 Sacrococcygeal disorders, not elsewhere classified: Secondary | ICD-10-CM | POA: Diagnosis not present

## 2022-02-10 DIAGNOSIS — M25561 Pain in right knee: Secondary | ICD-10-CM | POA: Diagnosis not present

## 2022-02-10 DIAGNOSIS — S83281A Other tear of lateral meniscus, current injury, right knee, initial encounter: Secondary | ICD-10-CM

## 2022-02-10 NOTE — Assessment & Plan Note (Signed)
Continues to complain of burning.  Discussed with patient about the possibility of new injury inflammation.  The patient is to continue with conservative therapy at this time.

## 2022-02-10 NOTE — Patient Instructions (Addendum)
Keep working on exercises Ask John to KT tape Glucosamin '1500mg'$  daily Look into collagen proteins

## 2022-02-10 NOTE — Assessment & Plan Note (Signed)
Continues to have some discomfort and worsening symptoms.  We discussed with patient about the possibility of injections again.  Patient declined at the moment.  Increase activity slowly otherwise.  Follow-up again in 6 to 8 weeks.

## 2022-02-11 DIAGNOSIS — M810 Age-related osteoporosis without current pathological fracture: Secondary | ICD-10-CM | POA: Diagnosis not present

## 2022-02-12 DIAGNOSIS — K589 Irritable bowel syndrome without diarrhea: Secondary | ICD-10-CM | POA: Diagnosis not present

## 2022-02-12 DIAGNOSIS — G8929 Other chronic pain: Secondary | ICD-10-CM | POA: Diagnosis not present

## 2022-02-12 DIAGNOSIS — J301 Allergic rhinitis due to pollen: Secondary | ICD-10-CM | POA: Diagnosis not present

## 2022-02-12 DIAGNOSIS — E039 Hypothyroidism, unspecified: Secondary | ICD-10-CM | POA: Diagnosis not present

## 2022-02-12 DIAGNOSIS — J4520 Mild intermittent asthma, uncomplicated: Secondary | ICD-10-CM | POA: Diagnosis not present

## 2022-02-12 DIAGNOSIS — J3081 Allergic rhinitis due to animal (cat) (dog) hair and dander: Secondary | ICD-10-CM | POA: Diagnosis not present

## 2022-02-12 DIAGNOSIS — G4700 Insomnia, unspecified: Secondary | ICD-10-CM | POA: Diagnosis not present

## 2022-02-12 DIAGNOSIS — J309 Allergic rhinitis, unspecified: Secondary | ICD-10-CM | POA: Diagnosis not present

## 2022-02-12 DIAGNOSIS — J3089 Other allergic rhinitis: Secondary | ICD-10-CM | POA: Diagnosis not present

## 2022-02-12 DIAGNOSIS — M810 Age-related osteoporosis without current pathological fracture: Secondary | ICD-10-CM | POA: Diagnosis not present

## 2022-02-12 DIAGNOSIS — J45909 Unspecified asthma, uncomplicated: Secondary | ICD-10-CM | POA: Diagnosis not present

## 2022-02-12 DIAGNOSIS — K5900 Constipation, unspecified: Secondary | ICD-10-CM | POA: Diagnosis not present

## 2022-02-12 DIAGNOSIS — K219 Gastro-esophageal reflux disease without esophagitis: Secondary | ICD-10-CM | POA: Diagnosis not present

## 2022-02-12 DIAGNOSIS — L719 Rosacea, unspecified: Secondary | ICD-10-CM | POA: Diagnosis not present

## 2022-02-17 DIAGNOSIS — J3089 Other allergic rhinitis: Secondary | ICD-10-CM | POA: Diagnosis not present

## 2022-02-17 DIAGNOSIS — J301 Allergic rhinitis due to pollen: Secondary | ICD-10-CM | POA: Diagnosis not present

## 2022-02-17 DIAGNOSIS — J3081 Allergic rhinitis due to animal (cat) (dog) hair and dander: Secondary | ICD-10-CM | POA: Diagnosis not present

## 2022-02-19 ENCOUNTER — Other Ambulatory Visit: Payer: Self-pay

## 2022-02-19 ENCOUNTER — Telehealth: Payer: Self-pay | Admitting: Family Medicine

## 2022-02-19 DIAGNOSIS — S83281A Other tear of lateral meniscus, current injury, right knee, initial encounter: Secondary | ICD-10-CM

## 2022-02-19 DIAGNOSIS — G8929 Other chronic pain: Secondary | ICD-10-CM

## 2022-02-19 NOTE — Telephone Encounter (Signed)
Referral sent 

## 2022-02-19 NOTE — Telephone Encounter (Signed)
Patient called asking that a referral be sent to [facility] for her left SI and right knee.   She said that she has been seeing them but with her new insurance, they require a referral.  Please advise.

## 2022-02-25 DIAGNOSIS — Z8542 Personal history of malignant neoplasm of other parts of uterus: Secondary | ICD-10-CM | POA: Diagnosis not present

## 2022-02-25 DIAGNOSIS — K219 Gastro-esophageal reflux disease without esophagitis: Secondary | ICD-10-CM | POA: Diagnosis not present

## 2022-02-25 DIAGNOSIS — K5900 Constipation, unspecified: Secondary | ICD-10-CM | POA: Diagnosis not present

## 2022-02-25 DIAGNOSIS — J301 Allergic rhinitis due to pollen: Secondary | ICD-10-CM | POA: Diagnosis not present

## 2022-02-25 DIAGNOSIS — J3081 Allergic rhinitis due to animal (cat) (dog) hair and dander: Secondary | ICD-10-CM | POA: Diagnosis not present

## 2022-02-25 DIAGNOSIS — J3089 Other allergic rhinitis: Secondary | ICD-10-CM | POA: Diagnosis not present

## 2022-03-10 DIAGNOSIS — G8929 Other chronic pain: Secondary | ICD-10-CM | POA: Diagnosis not present

## 2022-03-10 DIAGNOSIS — D1771 Benign lipomatous neoplasm of kidney: Secondary | ICD-10-CM | POA: Diagnosis not present

## 2022-03-10 DIAGNOSIS — Z8542 Personal history of malignant neoplasm of other parts of uterus: Secondary | ICD-10-CM | POA: Diagnosis not present

## 2022-03-10 DIAGNOSIS — Z90710 Acquired absence of both cervix and uterus: Secondary | ICD-10-CM | POA: Diagnosis not present

## 2022-03-11 DIAGNOSIS — J3081 Allergic rhinitis due to animal (cat) (dog) hair and dander: Secondary | ICD-10-CM | POA: Diagnosis not present

## 2022-03-11 DIAGNOSIS — J3089 Other allergic rhinitis: Secondary | ICD-10-CM | POA: Diagnosis not present

## 2022-03-11 DIAGNOSIS — J301 Allergic rhinitis due to pollen: Secondary | ICD-10-CM | POA: Diagnosis not present

## 2022-03-12 DIAGNOSIS — G8929 Other chronic pain: Secondary | ICD-10-CM | POA: Diagnosis not present

## 2022-03-12 DIAGNOSIS — M533 Sacrococcygeal disorders, not elsewhere classified: Secondary | ICD-10-CM | POA: Diagnosis not present

## 2022-03-12 DIAGNOSIS — M25561 Pain in right knee: Secondary | ICD-10-CM | POA: Diagnosis not present

## 2022-03-17 DIAGNOSIS — J301 Allergic rhinitis due to pollen: Secondary | ICD-10-CM | POA: Diagnosis not present

## 2022-03-17 DIAGNOSIS — J3089 Other allergic rhinitis: Secondary | ICD-10-CM | POA: Diagnosis not present

## 2022-03-17 DIAGNOSIS — J3081 Allergic rhinitis due to animal (cat) (dog) hair and dander: Secondary | ICD-10-CM | POA: Diagnosis not present

## 2022-03-20 ENCOUNTER — Ambulatory Visit: Payer: BC Managed Care – PPO | Admitting: Internal Medicine

## 2022-03-26 ENCOUNTER — Encounter: Payer: Self-pay | Admitting: Family Medicine

## 2022-03-26 NOTE — Progress Notes (Unsigned)
[place] Phone: [phone] Subjective:   [name], am serving as a scribe for Dr. Hulan Saas.  I'm seeing this patient by the request  of:  Plotnikov, [name], MD  CC: Right knee pain follow-up  WER:[id number]  02/10/2022 Continues to have some discomfort and worsening symptoms.  We discussed with patient about the possibility of injections again.  Patient declined at the moment.  Increase activity slowly otherwise.  Follow-up again in 6 to 8 weeks.  Continues to complain of burning.  Discussed with patient about the possibility of new injury inflammation.  The patient is to continue with conservative therapy at this time.  Update 03/27/2022 [name] is a 66 y.o. female coming in with complaint of R knee and SI joint pain. Patient states that her back pain has improved. Less pain in the mornings. Pain in SI joint persists but manageable.   Knee pain is limited activities. Painful do quick stops and starts, gardening, walking her dog or walking on uneven terrain. Patient has been wearing knee sleeve.        Past Medical History:  Diagnosis Date   ALLERGIC RHINITIS 03/02/2007   Anemia    ANXIETY 03/02/2007   Arthritis    ASTHMA 11/12/2007   Dr Orvil Feil   Cancer [facility])    skin, hx of   Dysrhythmia    pt. states at night will notice a different heart rhythem   Endometrial cancer ([place])    [place], HX OF 03/02/2007   GERD 03/02/2007   HYPOTHYROIDISM 11/12/2007   Dr Chalmers Cater   Neuromuscular disorder ([place])    carpel tunnel syndrome, compression of ulner nerve at elbows   OSTEOPENIA 11/12/2007   PEPTIC ULCER DISEASE 03/02/2007   Pneumonia    hx. of   Radiation 09/27/15-10/23/15   HDR to vaginal cuff 30 Gy   Stress fracture    TMJ SYNDROME 11/12/2007   Past Surgical History:  Procedure Laterality Date   2008 TMJ surgery      CARPAL TUNNEL WITH CUBITAL TUNNEL Left 07/2019   carpel  tunnel Right 11/20/2016   colonoscopy x 2     cubital tunnel release Right 11/20/2016   deviated septum surgery - 2007     DILATION AND CURETTAGE OF UTERUS     2017   endoscopy - 1990     Mandib advancement forward  2009   ROBOTIC ASSISTED TOTAL HYSTERECTOMY WITH BILATERAL SALPINGO OOPHERECTOMY Bilateral 08/14/2015   Procedure: XI ROBOTIC ASSISTED TOTAL HYSTERECTOMY WITH BILATERAL SALPINGO OOPHORECTOMY AND SENTAL LYMPH NODE BIOPSY;  Surgeon: Everitt Amber, MD;  Location: [facility];  Service: Gynecology;  Laterality: Bilateral;   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     Social History   Socioeconomic History   Marital status: Married    Spouse name: Not on file   Number of children: Not on file   Years of education: Not on file   Highest education level: Not on file  Occupational History   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[employer]  Tobacco Use   Smoking status: Never   Smokeless tobacco: Never   Tobacco comments:    Married, 1 child  Vaping Use   Vaping Use: Never used  Substance and Sexual Activity   Alcohol use: Yes    Alcohol/week: 2.0 standard drinks of alcohol    Types: 1 Glasses of wine, 1 Cans of beer per week  Comment: daily   Drug use: Caldwell   Sexual activity: Yes  Other Topics Concern   Not on file  Social History Narrative   Not on file   Social Determinants of Health   Financial Resource Strain: Not on file  Food Insecurity: Not on file  Transportation Needs: Not on file  Physical Activity: Not on file  Stress: Not on file  Social Connections: Not on file   Allergies  Allergen Reactions   Ibuprofen Other (See Comments)    Gastritis    Shellfish Allergy Other (See Comments)    Abdominal pain and Diarrhea   Colchicine     diarrhea   Astroglide [Gyne-Moistrin] Other (See Comments)    Itching and burning   Demerol Nausea And Vomiting   Penicillins Hives and Other (See Comments)    Face turned red and had a headache. Has patient had a  PCN reaction causing immediate rash, facial/tongue/throat swelling, SOB or lightheadedness with hypotension: Caldwell Has patient had a PCN reaction causing severe rash involving mucus membranes or skin necrosis: Caldwell Has patient had a PCN reaction that required hospitalization: Caldwell Has patient had a PCN reaction occurring within the last 10 years: Caldwell If all of the above answers are "Caldwell", then may proceed with Cephalosporin use.   Family History  Problem Relation Age of Onset   Hypertension Father    Heart disease Father 2       chf   Heart disease Mother 75       CAD   Hypertension Other    Asthma Neg Hx     Current Outpatient Medications (Endocrine & Metabolic):    levothyroxine (SYNTHROID, LEVOTHROID) 25 MCG tablet, Take 25-50 mcg by mouth daily. She takes one tablet daily 4 days per week (Monday-Thursday) and two tablets daily 3 days per week (Friday-Sunday).   liothyronine (CYTOMEL) 5 MCG tablet, Take 5-10 mcg by mouth 2 (two) times daily. She takes one tablet in the morning and two tablets midday.   zoledronic acid (RECLAST) 5 MG/100ML SOLN injection, Inject 5 mg into the vein. Once a year  Current Outpatient Medications (Cardiovascular):    EPIPEN 2-PAK 0.3 MG/0.3ML DEVI, Inject 0.3 mg into the muscle once. Reported on 09/03/2015  Current Outpatient Medications (Respiratory):    albuterol (VENTOLIN HFA) 108 (90 Base) MCG/ACT inhaler, Inhale 2 puffs into the lungs every 6 (six) hours as needed for wheezing or shortness of breath.   cetirizine (ZYRTEC) 5 MG tablet, Take 5 mg by mouth 2 (two) times daily as needed for allergies.   fluticasone (FLONASE) 50 MCG/ACT nasal spray, Place 1 spray into the nose daily as needed for allergies. Reported on 08/06/2015   fluticasone furoate-vilanterol (BREO ELLIPTA) 100-25 MCG/ACT AEPB,    IPRATROPIUM BROMIDE HFA IN,     Current Outpatient Medications (Other):    Calcium Citrate-Vitamin D (CALCIUM CITRATE +D PO), Take 2 tablets by mouth daily.     Diclofenac Sodium 2 % SOLN, Place 2 g onto the skin 2 (two) times daily.   docusate sodium (COLACE) 100 MG capsule, Take 100 mg by mouth 2 (two) times daily.   estradiol (ESTRACE) 0.1 MG/GM vaginal cream, PLACE 1 APPLICATORFUL VAGINALLY 3 (THREE) TIMES A WEEK   famotidine (PEPCID) 40 MG tablet, TAKE 1 TABLET BY MOUTH EVERY DAY   MELATONIN-CHAMOMILE PO, Take 1 capsule by mouth at bedtime as needed and may repeat dose one time if needed (For sleep.).    METRONIDAZOLE, TOPICAL, 0.75 % LOTN, Apply 1 application  topically at bedtime.   minoxidil (ROGAINE) 2 % external solution, Apply 1 application topically 4 (four) times a week.    Multiple Vitamin (MULTIVITAMIN WITH MINERALS) TABS tablet, Take 1 tablet by mouth daily.   OVER THE COUNTER MEDICATION, Lion's Mane Extract 250 mg. 1 capsuleby mouth daily   OVER THE COUNTER MEDICATION, Oreganol 140 mg by mouth daily.   Pancrelipase, Lip-Prot-Amyl, (ZENPEP) 40000-126000 units CPEP, 1 po qid ac   pyridoxine (B-6) 200 MG tablet, Take 200 mg by mouth daily.   venlafaxine XR (EFFEXOR-XR) 37.5 MG 24 hr capsule, TAKE 1 CAPSULE BY MOUTH EVERY DAY WITH BREAKFAST (Patient taking differently: Take 37.5 mg by mouth. 3 times a week)    Review of Systems:  Caldwell headache, visual changes, nausea, vomiting, diarrhea, constipation, dizziness, abdominal pain, skin rash, fevers, chills, night sweats, weight loss, swollen lymph nodes, body aches, chest pain, shortness of breath, mood changes. POSITIVE muscle aches, joint swelling  Objective  Blood pressure 132/82, pulse 78, height '5\' 4"'$  (1.626 m), weight 138 lb (62.6 kg), SpO2 99 %.   General: Caldwell apparent distress alert and oriented x3 mood and affect normal, dressed appropriately.  HEENT: Pupils equal, extraocular movements intact  Respiratory: Patient's speak in full sentences and does not appear short of breath  Cardiovascular: Caldwell lower extremity edema, non tender, Caldwell erythema  Antalgic gait noted.  Right knee does  seem to have a small effusion noted.  Positive McMurray's especially with external rotation and pain on the lateral aspect of the knee.  Even patient going from a seated position to the exam table had a catching motion in her knee which did appear to lock.  Limited muscular skeletal ultrasound was performed and interpreted by Hulan Saas, M Limited ultrasound shows patient does have hypoechoic changes in the patella femoral area.  Patient also has a abnormality noted of the medial to posterior lateral meniscus that is consistent still with an acute meniscal tear with some mild displacement.  Medial meniscus difficult to assess on the posterior aspect but appears to be unremarkable. Pression: Lateral meniscal injury    Impression and Recommendations:     The above documentation has been reviewed and is accurate and complete Lyndal Pulley, DO

## 2022-03-27 ENCOUNTER — Encounter: Payer: Self-pay | Admitting: Family Medicine

## 2022-03-27 ENCOUNTER — Ambulatory Visit: Payer: Self-pay

## 2022-03-27 ENCOUNTER — Ambulatory Visit (INDEPENDENT_AMBULATORY_CARE_PROVIDER_SITE_OTHER): Payer: PPO | Admitting: Family Medicine

## 2022-03-27 VITALS — BP 132/82 | HR 78 | Ht 64.0 in | Wt 138.0 lb

## 2022-03-27 DIAGNOSIS — G8929 Other chronic pain: Secondary | ICD-10-CM | POA: Diagnosis not present

## 2022-03-27 DIAGNOSIS — M25561 Pain in right knee: Secondary | ICD-10-CM | POA: Diagnosis not present

## 2022-03-27 DIAGNOSIS — S83281A Other tear of lateral meniscus, current injury, right knee, initial encounter: Secondary | ICD-10-CM | POA: Diagnosis not present

## 2022-03-27 DIAGNOSIS — M533 Sacrococcygeal disorders, not elsewhere classified: Secondary | ICD-10-CM | POA: Diagnosis not present

## 2022-03-27 NOTE — Assessment & Plan Note (Signed)
Patient has not been doing greater than 14 weeks of physical therapy, working with a physical therapist, doing home exercises, failed oral anti-inflammatories as well is injection.  I do feel that advanced imaging is warranted to see if any surgical intervention is necessary.  Patient is also having locking and giving out on her that is affecting daily activities as well as her job performance.

## 2022-03-27 NOTE — Patient Instructions (Addendum)
[facility] Call Today  When we receive your results we will contact you.

## 2022-03-28 DIAGNOSIS — J301 Allergic rhinitis due to pollen: Secondary | ICD-10-CM | POA: Diagnosis not present

## 2022-03-28 DIAGNOSIS — J3089 Other allergic rhinitis: Secondary | ICD-10-CM | POA: Diagnosis not present

## 2022-03-29 ENCOUNTER — Other Ambulatory Visit: Payer: Self-pay | Admitting: Internal Medicine

## 2022-04-07 DIAGNOSIS — J301 Allergic rhinitis due to pollen: Secondary | ICD-10-CM | POA: Diagnosis not present

## 2022-04-07 DIAGNOSIS — J3081 Allergic rhinitis due to animal (cat) (dog) hair and dander: Secondary | ICD-10-CM | POA: Diagnosis not present

## 2022-04-07 DIAGNOSIS — J3089 Other allergic rhinitis: Secondary | ICD-10-CM | POA: Diagnosis not present

## 2022-04-08 ENCOUNTER — Ambulatory Visit: Payer: BC Managed Care – PPO | Admitting: Family Medicine

## 2022-04-11 ENCOUNTER — Ambulatory Visit (HOSPITAL_BASED_OUTPATIENT_CLINIC_OR_DEPARTMENT_OTHER)
Admission: RE | Admit: 2022-04-11 | Discharge: 2022-04-11 | Disposition: A | Discharge status: Home / Self Care | Payer: PPO | Source: Ambulatory Visit | Attending: Family Medicine | Admitting: Family Medicine

## 2022-04-11 DIAGNOSIS — S83241A Other tear of medial meniscus, current injury, right knee, initial encounter: Secondary | ICD-10-CM | POA: Insufficient documentation

## 2022-04-11 DIAGNOSIS — M25561 Pain in right knee: Secondary | ICD-10-CM | POA: Diagnosis not present

## 2022-04-11 DIAGNOSIS — G8929 Other chronic pain: Secondary | ICD-10-CM | POA: Insufficient documentation

## 2022-04-14 DIAGNOSIS — J3081 Allergic rhinitis due to animal (cat) (dog) hair and dander: Secondary | ICD-10-CM | POA: Diagnosis not present

## 2022-04-14 DIAGNOSIS — J301 Allergic rhinitis due to pollen: Secondary | ICD-10-CM | POA: Diagnosis not present

## 2022-04-14 DIAGNOSIS — J3089 Other allergic rhinitis: Secondary | ICD-10-CM | POA: Diagnosis not present

## 2022-04-15 ENCOUNTER — Encounter: Payer: Self-pay | Admitting: Family Medicine

## 2022-04-21 ENCOUNTER — Telehealth: Payer: Self-pay | Admitting: Family Medicine

## 2022-04-21 ENCOUNTER — Other Ambulatory Visit: Payer: Self-pay

## 2022-04-21 DIAGNOSIS — M25562 Pain in left knee: Secondary | ICD-10-CM

## 2022-04-21 DIAGNOSIS — S83281A Other tear of lateral meniscus, current injury, right knee, initial encounter: Secondary | ICD-10-CM

## 2022-04-21 NOTE — Telephone Encounter (Signed)
Error

## 2022-04-21 NOTE — Telephone Encounter (Signed)
Patient called asking if a referral could be sent to [facility] for her left knee?  Please advise.

## 2022-04-25 DIAGNOSIS — J301 Allergic rhinitis due to pollen: Secondary | ICD-10-CM | POA: Diagnosis not present

## 2022-04-25 DIAGNOSIS — J3089 Other allergic rhinitis: Secondary | ICD-10-CM | POA: Diagnosis not present

## 2022-04-25 DIAGNOSIS — J3081 Allergic rhinitis due to animal (cat) (dog) hair and dander: Secondary | ICD-10-CM | POA: Diagnosis not present

## 2022-05-02 DIAGNOSIS — J301 Allergic rhinitis due to pollen: Secondary | ICD-10-CM | POA: Diagnosis not present

## 2022-05-02 DIAGNOSIS — J3089 Other allergic rhinitis: Secondary | ICD-10-CM | POA: Diagnosis not present

## 2022-05-09 DIAGNOSIS — J3081 Allergic rhinitis due to animal (cat) (dog) hair and dander: Secondary | ICD-10-CM | POA: Diagnosis not present

## 2022-05-09 DIAGNOSIS — J301 Allergic rhinitis due to pollen: Secondary | ICD-10-CM | POA: Diagnosis not present

## 2022-05-09 DIAGNOSIS — J3089 Other allergic rhinitis: Secondary | ICD-10-CM | POA: Diagnosis not present

## 2022-05-14 DIAGNOSIS — R269 Unspecified abnormalities of gait and mobility: Secondary | ICD-10-CM | POA: Diagnosis not present

## 2022-05-14 DIAGNOSIS — M25562 Pain in left knee: Secondary | ICD-10-CM | POA: Diagnosis not present

## 2022-05-19 DIAGNOSIS — J3081 Allergic rhinitis due to animal (cat) (dog) hair and dander: Secondary | ICD-10-CM | POA: Diagnosis not present

## 2022-05-19 DIAGNOSIS — J3089 Other allergic rhinitis: Secondary | ICD-10-CM | POA: Diagnosis not present

## 2022-05-19 DIAGNOSIS — J301 Allergic rhinitis due to pollen: Secondary | ICD-10-CM | POA: Diagnosis not present

## 2022-05-21 ENCOUNTER — Encounter: Payer: Self-pay | Admitting: Family Medicine

## 2022-05-26 DIAGNOSIS — J3089 Other allergic rhinitis: Secondary | ICD-10-CM | POA: Diagnosis not present

## 2022-05-26 DIAGNOSIS — J301 Allergic rhinitis due to pollen: Secondary | ICD-10-CM | POA: Diagnosis not present

## 2022-05-26 DIAGNOSIS — J3081 Allergic rhinitis due to animal (cat) (dog) hair and dander: Secondary | ICD-10-CM | POA: Diagnosis not present

## 2022-05-27 DIAGNOSIS — M25561 Pain in right knee: Secondary | ICD-10-CM | POA: Diagnosis not present

## 2022-05-27 DIAGNOSIS — M25562 Pain in left knee: Secondary | ICD-10-CM | POA: Diagnosis not present

## 2022-05-27 DIAGNOSIS — G8929 Other chronic pain: Secondary | ICD-10-CM | POA: Diagnosis not present

## 2022-05-29 ENCOUNTER — Encounter: Payer: Self-pay | Admitting: Orthopedic Surgery

## 2022-05-29 ENCOUNTER — Other Ambulatory Visit: Payer: Self-pay | Admitting: Orthopedic Surgery

## 2022-05-29 DIAGNOSIS — G894 Chronic pain syndrome: Secondary | ICD-10-CM

## 2022-05-29 DIAGNOSIS — G8929 Other chronic pain: Secondary | ICD-10-CM

## 2022-06-03 DIAGNOSIS — J3081 Allergic rhinitis due to animal (cat) (dog) hair and dander: Secondary | ICD-10-CM | POA: Diagnosis not present

## 2022-06-03 DIAGNOSIS — J3089 Other allergic rhinitis: Secondary | ICD-10-CM | POA: Diagnosis not present

## 2022-06-03 DIAGNOSIS — J301 Allergic rhinitis due to pollen: Secondary | ICD-10-CM | POA: Diagnosis not present

## 2022-06-05 ENCOUNTER — Ambulatory Visit
Admission: RE | Admit: 2022-06-05 | Discharge: 2022-06-05 | Disposition: A | Discharge status: Home / Self Care | Payer: PPO | Source: Ambulatory Visit | Attending: Orthopedic Surgery | Admitting: Orthopedic Surgery

## 2022-06-05 DIAGNOSIS — G8929 Other chronic pain: Secondary | ICD-10-CM

## 2022-06-05 DIAGNOSIS — M25562 Pain in left knee: Secondary | ICD-10-CM | POA: Diagnosis not present

## 2022-06-09 DIAGNOSIS — J3089 Other allergic rhinitis: Secondary | ICD-10-CM | POA: Diagnosis not present

## 2022-06-09 DIAGNOSIS — J301 Allergic rhinitis due to pollen: Secondary | ICD-10-CM | POA: Diagnosis not present

## 2022-06-09 DIAGNOSIS — J3081 Allergic rhinitis due to animal (cat) (dog) hair and dander: Secondary | ICD-10-CM | POA: Diagnosis not present

## 2022-06-10 DIAGNOSIS — G8929 Other chronic pain: Secondary | ICD-10-CM | POA: Diagnosis not present

## 2022-06-10 DIAGNOSIS — M25561 Pain in right knee: Secondary | ICD-10-CM | POA: Diagnosis not present

## 2022-06-10 DIAGNOSIS — M25562 Pain in left knee: Secondary | ICD-10-CM | POA: Diagnosis not present

## 2022-06-11 ENCOUNTER — Ambulatory Visit (INDEPENDENT_AMBULATORY_CARE_PROVIDER_SITE_OTHER): Payer: PPO | Admitting: Family Medicine

## 2022-06-11 ENCOUNTER — Ambulatory Visit: Payer: Self-pay

## 2022-06-11 VITALS — BP 128/82 | Ht 64.5 in | Wt 135.0 lb

## 2022-06-11 DIAGNOSIS — M25562 Pain in left knee: Secondary | ICD-10-CM

## 2022-06-11 MED ORDER — METHYLPREDNISOLONE ACETATE 40 MG/ML IJ SUSP
40.0000 mg | Freq: Once | INTRAMUSCULAR | Status: AC
  Administered 2022-06-11: 40 mg via INTRA_ARTICULAR

## 2022-06-11 NOTE — Patient Instructions (Signed)
You're describing snapping hamstrings, less likely the baker's cyst being an issue. Your MRI is normal and quality is very good on this - very unlikely there's a flap meniscus tear and your pain is not where a plica will cause your knee to lock. You were given an intraarticular injection today - if this helps tremendously it suggests the cyst is the primary issue as it should decrease in size with this. Do hamstring stretches and strengthening exercises daily for next 6 weeks even if you improve. Follow up with [place] in 6 weeks.

## 2022-06-11 NOTE — Progress Notes (Signed)
[name] - 66 y.o. female MRN [id number]  Date of birth: 1955-12-15    CHIEF COMPLAINT:   left knee pain    SUBJECTIVE:   HPI:  Pleasant 66 year old female comes to clinic to be evaluated for left knee pain.  She has a 53-monthhistory of pain in the left knee.  It started after she was tangled up in her dog's leash and fell on the left knee.  Ever since then she has had pain in the medial posterior aspect of the leg.  She says the knee catches and locks on her.  She hears audible click with bending it.  She sometimes has to bend and straighten her knee several times to get it to "unlock".  This happens frequently in the morning.  The pain is also worse when she moves from a prolonged sitting to a standing position.  She has been using topical Voltaren and taking Tylenol for this.  She is also done physical therapy with not much improvement.  She has previously been evaluated for this by [facility]  She had MRI of the left knee that was generally unremarkable.  Her menisci, cruciate, collateral ligaments are intact but did have minimal joint fluid and a tiny Baker's cyst of the left knee.  She was offered an exploratory scope of the left knee but came here to get another opinion.  Of note, she does also have some right knee discomfort, catching and locking.  She has had that for about 6 months.  She does have a medial meniscus tear on the right knee and is considering arthroscopy for treatment of that.  ROS:     See HPI  PERTINENT  PMH / PSH FH / / SH:  Past Medical, Surgical, Social, and Family History Reviewed & Updated in the EMR.  Pertinent findings include:  None  OBJECTIVE: BP 128/82   Ht 5' 4.5" (1.638 m)   Wt 135 lb (61.2 kg)   BMI 22.81 kg/m   Physical Exam:  Vital signs are reviewed.  GEN: Alert and oriented, NAD Pulm: Breathing unlabored PSY: normal mood, congruent affect  MSK: Left knee -no obvious effusion.  No overlying  erythema or ecchymosis.  She is tender to palpation at the medial joint line and proximally over the distal semimembranosus and semitendinosus tendons.  Nontender palpation of the lateral joint line, over the patella, or over the tibial tubercle.  She has full range of motion in flexion extension.  5/5 strength throughout.  She has no ligamentous instability with valgus or varus stressing.  She has a negative Lachman.  Negative Thessaly test.  Negative McMurry test.  She is neurovascular intact distally.  Ultrasound: Left knee MSK ultrasound knee: Images were obtained both in the transverse and longitudinal plane. Patellar and quadriceps tendons were well visualized with no abnormalities. No effusion. Medial and lateral menisci were well visualized with no abnormalities. Popliteal cyst was evaluated in both the transverse and longitudinal planes.  Possibly a small remnant of a Baker's cyst  Impression: Unremarkable left knee ultrasound  ASSESSMENT & PLAN:  1.  Left knee pain, snapping hamstring syndrome versus internal derangement  -The etiology of her symptoms is difficult to ascertain.  Her pain does seem to be medial and posteriorly near the distal hamstring tendons, so her catching or locking is likely sequela of the hamstring tendons snapping over each other.  This clinical presentation does overlap with meniscus pathology to a certain degree,  however her MRI and ultrasound exam today are both reassuring there is no any major internal derangement of the menisci of the left knee.  Regardless, given the duration and severity of her symptoms, we will proceed with a diagnostic and therapeutic intra-articular corticosteroid injection left knee today.  If this helps provide her pain relief she may have more of a true arthritis/meniscal pathology.  If it is not, pathology is likely related to the hamstrings as above.  After about a week, I asked her to start on some Askling exercises for hamstring  strengthening to see if this helps.  Then have her follow back up in 6 weeks to see how she is doing.  Ultimately, I did advise against an exploratory scope in the left knee at this time.  All questions were answered and she agrees to plan.   Procedure performed:  Left Knee Intraarticular Corticosteroid Injection; palpation guided  Consent obtained and verified. Time-out conducted. Noted no overlying erythema, induration, or other signs of local infection. The left anterior medial joint space was palpated and marked. The overlying skin was prepped in a sterile fashion. Topical analgesic spray: Ethyl chloride. Needle: 25 gauge, 1.5 inch Meds: 40 mg methylrprednisolone, 3 ml 1% lidocaine without epinephrine Completed without difficulty  Advised to call if fevers/chills, erythema, induration, drainage, or persistent bleeding.    Dortha Kern, MD PGY-4, Sports Medicine Fellow [place]

## 2022-06-12 ENCOUNTER — Encounter: Payer: Self-pay | Admitting: Family Medicine

## 2022-06-16 DIAGNOSIS — K3189 Other diseases of stomach and duodenum: Secondary | ICD-10-CM | POA: Diagnosis not present

## 2022-06-16 DIAGNOSIS — Z8542 Personal history of malignant neoplasm of other parts of uterus: Secondary | ICD-10-CM | POA: Diagnosis not present

## 2022-06-16 DIAGNOSIS — R109 Unspecified abdominal pain: Secondary | ICD-10-CM | POA: Diagnosis not present

## 2022-06-16 DIAGNOSIS — K208 Other esophagitis without bleeding: Secondary | ICD-10-CM | POA: Diagnosis not present

## 2022-06-16 DIAGNOSIS — K449 Diaphragmatic hernia without obstruction or gangrene: Secondary | ICD-10-CM | POA: Diagnosis not present

## 2022-06-16 DIAGNOSIS — K648 Other hemorrhoids: Secondary | ICD-10-CM | POA: Diagnosis not present

## 2022-06-16 DIAGNOSIS — Z90710 Acquired absence of both cervix and uterus: Secondary | ICD-10-CM | POA: Diagnosis not present

## 2022-06-16 DIAGNOSIS — Z923 Personal history of irradiation: Secondary | ICD-10-CM | POA: Diagnosis not present

## 2022-06-16 DIAGNOSIS — Z9889 Other specified postprocedural states: Secondary | ICD-10-CM | POA: Diagnosis not present

## 2022-06-16 DIAGNOSIS — G8929 Other chronic pain: Secondary | ICD-10-CM | POA: Diagnosis not present

## 2022-06-16 DIAGNOSIS — D122 Benign neoplasm of ascending colon: Secondary | ICD-10-CM | POA: Diagnosis not present

## 2022-06-16 DIAGNOSIS — D120 Benign neoplasm of cecum: Secondary | ICD-10-CM | POA: Diagnosis not present

## 2022-06-16 DIAGNOSIS — K635 Polyp of colon: Secondary | ICD-10-CM | POA: Diagnosis not present

## 2022-06-16 DIAGNOSIS — K209 Esophagitis, unspecified without bleeding: Secondary | ICD-10-CM | POA: Diagnosis not present

## 2022-06-16 DIAGNOSIS — R1013 Epigastric pain: Secondary | ICD-10-CM | POA: Diagnosis not present

## 2022-06-17 ENCOUNTER — Encounter: Payer: Self-pay | Admitting: Internal Medicine

## 2022-06-18 DIAGNOSIS — J3089 Other allergic rhinitis: Secondary | ICD-10-CM | POA: Diagnosis not present

## 2022-06-18 DIAGNOSIS — J3081 Allergic rhinitis due to animal (cat) (dog) hair and dander: Secondary | ICD-10-CM | POA: Diagnosis not present

## 2022-06-18 DIAGNOSIS — J301 Allergic rhinitis due to pollen: Secondary | ICD-10-CM | POA: Diagnosis not present

## 2022-06-20 DIAGNOSIS — Z86010 Personal history of colonic polyps: Secondary | ICD-10-CM | POA: Insufficient documentation

## 2022-06-20 DIAGNOSIS — K2100 Gastro-esophageal reflux disease with esophagitis, without bleeding: Secondary | ICD-10-CM | POA: Insufficient documentation

## 2022-06-25 DIAGNOSIS — J3089 Other allergic rhinitis: Secondary | ICD-10-CM | POA: Diagnosis not present

## 2022-06-25 DIAGNOSIS — J3081 Allergic rhinitis due to animal (cat) (dog) hair and dander: Secondary | ICD-10-CM | POA: Diagnosis not present

## 2022-06-25 DIAGNOSIS — J301 Allergic rhinitis due to pollen: Secondary | ICD-10-CM | POA: Diagnosis not present

## 2022-06-27 DIAGNOSIS — R269 Unspecified abnormalities of gait and mobility: Secondary | ICD-10-CM | POA: Diagnosis not present

## 2022-06-27 DIAGNOSIS — M25562 Pain in left knee: Secondary | ICD-10-CM | POA: Diagnosis not present

## 2022-07-02 DIAGNOSIS — J301 Allergic rhinitis due to pollen: Secondary | ICD-10-CM | POA: Diagnosis not present

## 2022-07-02 DIAGNOSIS — J3081 Allergic rhinitis due to animal (cat) (dog) hair and dander: Secondary | ICD-10-CM | POA: Diagnosis not present

## 2022-07-02 DIAGNOSIS — J3089 Other allergic rhinitis: Secondary | ICD-10-CM | POA: Diagnosis not present

## 2022-07-10 ENCOUNTER — Encounter: Payer: Self-pay | Admitting: Family Medicine

## 2022-07-11 DIAGNOSIS — J3081 Allergic rhinitis due to animal (cat) (dog) hair and dander: Secondary | ICD-10-CM | POA: Diagnosis not present

## 2022-07-11 DIAGNOSIS — J301 Allergic rhinitis due to pollen: Secondary | ICD-10-CM | POA: Diagnosis not present

## 2022-07-11 DIAGNOSIS — J3089 Other allergic rhinitis: Secondary | ICD-10-CM | POA: Diagnosis not present

## 2022-07-16 DIAGNOSIS — S83221A Peripheral tear of medial meniscus, current injury, right knee, initial encounter: Secondary | ICD-10-CM | POA: Diagnosis not present

## 2022-07-16 DIAGNOSIS — G8918 Other acute postprocedural pain: Secondary | ICD-10-CM | POA: Diagnosis not present

## 2022-07-16 DIAGNOSIS — Y999 Unspecified external cause status: Secondary | ICD-10-CM | POA: Diagnosis not present

## 2022-07-16 DIAGNOSIS — M65861 Other synovitis and tenosynovitis, right lower leg: Secondary | ICD-10-CM | POA: Diagnosis not present

## 2022-07-16 DIAGNOSIS — X58XXXA Exposure to other specified factors, initial encounter: Secondary | ICD-10-CM | POA: Diagnosis not present

## 2022-07-16 DIAGNOSIS — S83241A Other tear of medial meniscus, current injury, right knee, initial encounter: Secondary | ICD-10-CM | POA: Diagnosis not present

## 2022-07-16 DIAGNOSIS — M94261 Chondromalacia, right knee: Secondary | ICD-10-CM | POA: Diagnosis not present

## 2022-07-16 DIAGNOSIS — M6751 Plica syndrome, right knee: Secondary | ICD-10-CM | POA: Diagnosis not present

## 2022-07-16 HISTORY — PX: OTHER SURGICAL HISTORY: SHX169

## 2022-07-22 DIAGNOSIS — G8929 Other chronic pain: Secondary | ICD-10-CM | POA: Diagnosis not present

## 2022-07-22 DIAGNOSIS — J3081 Allergic rhinitis due to animal (cat) (dog) hair and dander: Secondary | ICD-10-CM | POA: Diagnosis not present

## 2022-07-22 DIAGNOSIS — J3089 Other allergic rhinitis: Secondary | ICD-10-CM | POA: Diagnosis not present

## 2022-07-22 DIAGNOSIS — M25562 Pain in left knee: Secondary | ICD-10-CM | POA: Diagnosis not present

## 2022-07-22 DIAGNOSIS — M25561 Pain in right knee: Secondary | ICD-10-CM | POA: Diagnosis not present

## 2022-07-22 DIAGNOSIS — Z9889 Other specified postprocedural states: Secondary | ICD-10-CM | POA: Diagnosis not present

## 2022-07-22 DIAGNOSIS — J301 Allergic rhinitis due to pollen: Secondary | ICD-10-CM | POA: Diagnosis not present

## 2022-07-23 ENCOUNTER — Ambulatory Visit: Payer: Federal, State, Local not specified - PPO | Admitting: Family Medicine

## 2022-07-28 DIAGNOSIS — L570 Actinic keratosis: Secondary | ICD-10-CM | POA: Diagnosis not present

## 2022-07-28 DIAGNOSIS — L649 Androgenic alopecia, unspecified: Secondary | ICD-10-CM | POA: Diagnosis not present

## 2022-07-28 DIAGNOSIS — D1801 Hemangioma of skin and subcutaneous tissue: Secondary | ICD-10-CM | POA: Diagnosis not present

## 2022-07-28 DIAGNOSIS — D224 Melanocytic nevi of scalp and neck: Secondary | ICD-10-CM | POA: Diagnosis not present

## 2022-07-28 DIAGNOSIS — L821 Other seborrheic keratosis: Secondary | ICD-10-CM | POA: Diagnosis not present

## 2022-07-28 DIAGNOSIS — Z85828 Personal history of other malignant neoplasm of skin: Secondary | ICD-10-CM | POA: Diagnosis not present

## 2022-07-30 NOTE — Progress Notes (Signed)
[place] [address] [place] Phone: [phone] Subjective:   IVilma Meckel, am serving as a scribe for Dr. Hulan Saas.  I'm seeing this patient by the request  of:  Plotnikov, [name], MD  CC: left knee pain   [id number]  CHARINA FONS is a 67 y.o. female coming in with complaint of L knee pain. Scheduled for arthroscopy for R knee 07/16/2022. Patient states L knee feels weak and unstable. There is pain when walking. Wants to talk next steps. Right knee still feels unstable after arthroscopy.       Past Medical History:  Diagnosis Date   ALLERGIC RHINITIS 03/02/2007   Anemia    ANXIETY 03/02/2007   Arthritis    ASTHMA 11/12/2007   Dr Orvil Feil   Cancer [facility])    skin, hx of   Dysrhythmia    pt. states at night will notice a different heart rhythem   Endometrial cancer ([place])    [place], HX OF 03/02/2007   GERD 03/02/2007   HYPOTHYROIDISM 11/12/2007   Dr Chalmers Cater   Neuromuscular disorder ([place])    carpel tunnel syndrome, compression of ulner nerve at elbows   OSTEOPENIA 11/12/2007   PEPTIC ULCER DISEASE 03/02/2007   Pneumonia    hx. of   Radiation 09/27/15-10/23/15   HDR to vaginal cuff 30 Gy   Stress fracture    TMJ SYNDROME 11/12/2007   Past Surgical History:  Procedure Laterality Date   2008 TMJ surgery      CARPAL TUNNEL WITH CUBITAL TUNNEL Left 07/2019   carpel tunnel Right 11/20/2016   colonoscopy x 2     cubital tunnel release Right 11/20/2016   deviated septum surgery - 2007     DILATION AND CURETTAGE OF UTERUS     2017   endoscopy - 1990     Mandib advancement forward  2009   ROBOTIC ASSISTED TOTAL HYSTERECTOMY WITH BILATERAL SALPINGO OOPHERECTOMY Bilateral 08/14/2015   Procedure: XI ROBOTIC ASSISTED TOTAL HYSTERECTOMY WITH BILATERAL SALPINGO OOPHORECTOMY AND SENTAL LYMPH NODE BIOPSY;  Surgeon: Everitt Amber, MD;  Location: [facility];  Service: Gynecology;  Laterality: Bilateral;   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     Social History   Socioeconomic History   Marital status: Married    Spouse name: Not on file   Number of children: Not on file   Years of education: Not on file   Highest education level: Not on file  Occupational History   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[employer]  Tobacco Use   Smoking status: Never   Smokeless tobacco: Never   Tobacco comments:    Married, 1 child  Vaping Use   Vaping Use: Never used  Substance and Sexual Activity   Alcohol use: Yes    Alcohol/week: 2.0 standard drinks of alcohol    Types: 1 Glasses of wine, 1 Cans of beer per week    Comment: daily   Drug use: No   Sexual activity: Yes  Other Topics Concern   Not on file  Social History Narrative   Not on file   Social Determinants of Health   Financial Resource Strain: Not on file  Food Insecurity: Not on file  Transportation Needs: Not on file  Physical Activity: Not on file  Stress: Not on file  Social Connections: Not on file   Allergies  Allergen Reactions   Ibuprofen Other (See Comments)    Gastritis  Shellfish Allergy Other (See Comments)    Abdominal pain and Diarrhea   Colchicine     diarrhea   Astroglide [Gyne-Moistrin] Other (See Comments)    Itching and burning   Demerol Nausea And Vomiting   Penicillins Hives and Other (See Comments)    Face turned red and had a headache. Has patient had a PCN reaction causing immediate rash, facial/tongue/throat swelling, SOB or lightheadedness with hypotension: no Has patient had a PCN reaction causing severe rash involving mucus membranes or skin necrosis: no Has patient had a PCN reaction that required hospitalization: no Has patient had a PCN reaction occurring within the last 10 years: no If all of the above answers are "NO", then may proceed with Cephalosporin use.   Family History  Problem Relation Age of Onset   Hypertension Father    Heart disease Father 34       chf   Heart  disease Mother 72       CAD   Hypertension Other    Asthma Neg Hx     Current Outpatient Medications (Endocrine & Metabolic):    levothyroxine (SYNTHROID, LEVOTHROID) 25 MCG tablet, Take 25-50 mcg by mouth daily. She takes one tablet daily 4 days per week (Monday-Thursday) and two tablets daily 3 days per week (Friday-Sunday).   liothyronine (CYTOMEL) 5 MCG tablet, Take 5-10 mcg by mouth 2 (two) times daily. She takes one tablet in the morning and two tablets midday.   zoledronic acid (RECLAST) 5 MG/100ML SOLN injection, Inject 5 mg into the vein. Once a year  Current Outpatient Medications (Cardiovascular):    EPIPEN 2-PAK 0.3 MG/0.3ML DEVI, Inject 0.3 mg into the muscle once. Reported on 09/03/2015  Current Outpatient Medications (Respiratory):    albuterol (VENTOLIN HFA) 108 (90 Base) MCG/ACT inhaler, Inhale 2 puffs into the lungs every 6 (six) hours as needed for wheezing or shortness of breath.   cetirizine (ZYRTEC) 5 MG tablet, Take 5 mg by mouth 2 (two) times daily as needed for allergies.   fluticasone (FLONASE) 50 MCG/ACT nasal spray, Place 1 spray into the nose daily as needed for allergies. Reported on 08/06/2015   fluticasone furoate-vilanterol (BREO ELLIPTA) 100-25 MCG/ACT AEPB,    IPRATROPIUM BROMIDE HFA IN,     Current Outpatient Medications (Other):    Calcium Citrate-Vitamin D (CALCIUM CITRATE +D PO), Take 2 tablets by mouth daily.    Diclofenac Sodium 2 % SOLN, Place 2 g onto the skin 2 (two) times daily.   docusate sodium (COLACE) 100 MG capsule, Take 100 mg by mouth 2 (two) times daily.   estradiol (ESTRACE) 0.1 MG/GM vaginal cream, PLACE 1 APPLICATORFUL VAGINALLY 3 (THREE) TIMES A WEEK   famotidine (PEPCID) 40 MG tablet, TAKE 1 TABLET BY MOUTH EVERY DAY   MELATONIN-CHAMOMILE PO, Take 1 capsule by mouth at bedtime as needed and may repeat dose one time if needed (For sleep.).    METRONIDAZOLE, TOPICAL, 0.75 % LOTN, Apply 1 application topically at bedtime.   minoxidil  (ROGAINE) 2 % external solution, Apply 1 application topically 4 (four) times a week.    Multiple Vitamin (MULTIVITAMIN WITH MINERALS) TABS tablet, Take 1 tablet by mouth daily.   OVER THE COUNTER MEDICATION, Lion's Mane Extract 250 mg. 1 capsuleby mouth daily   OVER THE COUNTER MEDICATION, Oreganol 140 mg by mouth daily.   Pancrelipase, Lip-Prot-Amyl, (ZENPEP) 40000-126000 units CPEP, 1 po qid ac   pyridoxine (B-6) 200 MG tablet, Take 200 mg by mouth daily.   venlafaxine XR (  EFFEXOR-XR) 37.5 MG 24 hr capsule, TAKE 1 CAPSULE BY MOUTH EVERY DAY WITH BREAKFAST (Patient taking differently: Take 37.5 mg by mouth. 3 times a week)   Reviewed prior external information including notes and imaging from  primary care provider As well as notes that were available from care everywhere and other healthcare systems.  Past medical history, social, surgical and family history all reviewed in electronic medical record.  No pertanent information unless stated regarding to the chief complaint.   Review of Systems:  No headache, visual changes, nausea, vomiting, diarrhea, constipation, dizziness, abdominal pain, skin rash, fevers, chills, night sweats, weight loss, swollen lymph nodes, body aches, joint swelling, chest pain, shortness of breath, mood changes. POSITIVE muscle aches  Objective  Blood pressure 106/60, pulse 83, height '5\' 4"'$  (1.626 m), weight 139 lb (63 kg), SpO2 97 %.   General: No apparent distress alert and oriented x3 mood and affect normal, dressed appropriately.  HEENT: Pupils equal, extraocular movements intact  Respiratory: Patient's speak in full sentences and does not appear short of breath  Cardiovascular: No lower extremity edema, non tender, no erythema  Low back does have TTP  Atrophy of the left thigh noticed from the contralateral side.  Patient does have the postsurgical changes of the right knee noted.  No significant instability of the knees.  Patient does have weakness in the  L3 distribution and somewhat in the L4 with hip abduction left greater than right.  Positive straight leg test noted    Impression and Recommendations:    The above documentation has been reviewed and is accurate and complete Lyndal Pulley, DO

## 2022-07-31 DIAGNOSIS — J301 Allergic rhinitis due to pollen: Secondary | ICD-10-CM | POA: Diagnosis not present

## 2022-07-31 DIAGNOSIS — J3089 Other allergic rhinitis: Secondary | ICD-10-CM | POA: Diagnosis not present

## 2022-08-05 ENCOUNTER — Ambulatory Visit (INDEPENDENT_AMBULATORY_CARE_PROVIDER_SITE_OTHER): Payer: PPO | Admitting: Family Medicine

## 2022-08-05 ENCOUNTER — Encounter: Payer: Self-pay | Admitting: Family Medicine

## 2022-08-05 VITALS — BP 106/60 | HR 83 | Ht 64.0 in | Wt 139.0 lb

## 2022-08-05 DIAGNOSIS — G8929 Other chronic pain: Secondary | ICD-10-CM

## 2022-08-05 DIAGNOSIS — M5442 Lumbago with sciatica, left side: Secondary | ICD-10-CM

## 2022-08-05 DIAGNOSIS — M79605 Pain in left leg: Secondary | ICD-10-CM

## 2022-08-05 NOTE — Patient Instructions (Addendum)
[facility]  ([address] in [facility]) [address] [place], [place] [address] [place] [phone] Call Today  When we receive your results we will contact you.  Nerve conduction study

## 2022-08-05 NOTE — Assessment & Plan Note (Addendum)
Patient is having left leg pain out of proportion.  Feels like she is having increasing instability and patient does have atrophy of the musculature.  Do feel ABI and nerve conduction study could be beneficial.  Patient has a history of the bulging disks previously.  Concerned that there is some lumbar radiculopathy with patient having some improvement with different positioning of her back more than her leg.  I do feel that advanced imaging with an MRI could be beneficial as well.  Discussed which activities to do and which ones to avoid.  Follow-up with me again after all the tests including the nerve conduction study and ABIs to further evaluate.  Patient knows of worsening weakness or pain to seek medical attention immediately.

## 2022-08-07 DIAGNOSIS — J3089 Other allergic rhinitis: Secondary | ICD-10-CM | POA: Diagnosis not present

## 2022-08-07 DIAGNOSIS — J301 Allergic rhinitis due to pollen: Secondary | ICD-10-CM | POA: Diagnosis not present

## 2022-08-12 ENCOUNTER — Encounter: Payer: Self-pay | Admitting: Neurology

## 2022-08-12 ENCOUNTER — Other Ambulatory Visit: Payer: Self-pay

## 2022-08-12 DIAGNOSIS — R202 Paresthesia of skin: Secondary | ICD-10-CM

## 2022-08-13 ENCOUNTER — Other Ambulatory Visit: Payer: Federal, State, Local not specified - PPO

## 2022-08-14 DIAGNOSIS — J3089 Other allergic rhinitis: Secondary | ICD-10-CM | POA: Diagnosis not present

## 2022-08-14 DIAGNOSIS — J3081 Allergic rhinitis due to animal (cat) (dog) hair and dander: Secondary | ICD-10-CM | POA: Diagnosis not present

## 2022-08-14 DIAGNOSIS — J301 Allergic rhinitis due to pollen: Secondary | ICD-10-CM | POA: Diagnosis not present

## 2022-08-15 ENCOUNTER — Telehealth: Payer: Self-pay | Admitting: Family Medicine

## 2022-08-15 DIAGNOSIS — E559 Vitamin D deficiency, unspecified: Secondary | ICD-10-CM | POA: Diagnosis not present

## 2022-08-15 DIAGNOSIS — E039 Hypothyroidism, unspecified: Secondary | ICD-10-CM | POA: Diagnosis not present

## 2022-08-15 DIAGNOSIS — M810 Age-related osteoporosis without current pathological fracture: Secondary | ICD-10-CM | POA: Diagnosis not present

## 2022-08-15 NOTE — Telephone Encounter (Signed)
Left message letting patient know °

## 2022-08-15 NOTE — Telephone Encounter (Signed)
Patient called stating that when she was here to see Dr Tamala Julian, she was under the impression that he wanted the tests done in a particular order (Arterial circulation test, nerve conduction, and then if needed, MRI). She is currently scheduled for the Nerve conduction study on Monday at 10am and then the articular circulation test at 1pm.  Is this okay?  She went ahead and set up the MRI for 2/26 so if it is needed, she will already have the appointment.

## 2022-08-18 ENCOUNTER — Other Ambulatory Visit: Payer: Self-pay | Admitting: Family Medicine

## 2022-08-18 ENCOUNTER — Ambulatory Visit (HOSPITAL_COMMUNITY)
Admission: RE | Admit: 2022-08-18 | Discharge: 2022-08-18 | Disposition: A | Discharge status: Home / Self Care | Payer: PPO | Source: Ambulatory Visit | Attending: Cardiovascular Disease | Admitting: Cardiovascular Disease

## 2022-08-18 ENCOUNTER — Ambulatory Visit (INDEPENDENT_AMBULATORY_CARE_PROVIDER_SITE_OTHER): Payer: PPO | Admitting: Neurology

## 2022-08-18 DIAGNOSIS — M5442 Lumbago with sciatica, left side: Secondary | ICD-10-CM | POA: Insufficient documentation

## 2022-08-18 DIAGNOSIS — R202 Paresthesia of skin: Secondary | ICD-10-CM

## 2022-08-18 DIAGNOSIS — G8929 Other chronic pain: Secondary | ICD-10-CM

## 2022-08-18 DIAGNOSIS — R200 Anesthesia of skin: Secondary | ICD-10-CM

## 2022-08-18 DIAGNOSIS — M79605 Pain in left leg: Secondary | ICD-10-CM

## 2022-08-18 DIAGNOSIS — M5417 Radiculopathy, lumbosacral region: Secondary | ICD-10-CM

## 2022-08-18 LAB — VAS US ABI WITH/WO TBI
Left ABI: 1.25
Right ABI: 1.24

## 2022-08-18 NOTE — Procedures (Signed)
[facility]  [address]  [place], [place] [address] Tel: [phone] Fax: [phone] Test Date:  08/18/2022  Patient: [name] DOB: 1955/12/29 Physician: Kai Levins, MD  Sex: Female Height: 5' 4"$  Ref Phys: Hulan Saas, DO  ID#: [id number]   Technician:    History: This is a 67 year old female with left leg pain and weakness and back pain.  NCV & EMG Findings: Extensive electrodiagnostic evaluation of the left lower limb with additional needle examination of the right lower limb shows: Left sural and superficial peroneal/fibular sensory responses are within normal limits. Left peroneal/fibular (EDB) and tibial (AH) motor responses are within normal limits. Left H Reflex latency is within normal limits. Chronic motor axon loss changes without accompanying active denervation changes are seen in the left tibialis anterior, left flexor digitorum longus, left gluteus medius, and right tibialis anterior muscles. All other tested muscles are within normal limits with normal motor unit configuration and recruitment patterns.  Impression: This is an abnormal study. Findings are most consistent with the following: The residuals of an old intraspinal canal lesion (ie: motor radiculopathy) at bilateral L5 roots, mild in degree electrically. No electrodiagnostic evidence of a large fiber sensorimotor polyneuropathy.    ___________________________ Kai Levins, MD    Nerve Conduction Studies Motor Nerve Results    Latency Amplitude F-Lat Segment Distance CV Comment  Site (ms) Norm (mV) Norm (ms)  (cm) (m/s) Norm   Left Fibular (EDB) Motor  Ankle 3.5  < 6.0 4.6  > 2.5        Bel fib head 9.8 - 4.0 -  Bel fib head-Ankle 31 49  > 40   Pop fossa 11.7 - 3.9 -  Pop fossa-Bel fib head 9 47 -   Left Tibial (AH) Motor  Ankle 3.8  < 6.0 15.5  > 4.0        Knee 11.0 - 12.1 -  Knee-Ankle 36 50  > 40    Sensory Sites    Neg Peak Lat Amplitude (O-P) Segment  Distance Velocity Comment  Site (ms) Norm (V) Norm  (cm) (ms)   Left Superficial Fibular Sensory  14 cm-Ankle 2.6  < 4.6 8  > 3 14 cm-Ankle 14    Left Sural Sensory  Calf-Lat mall 3.8  < 4.6 17  > 3 Calf-Lat mall 14     H-Reflex Results    M-Lat H Lat H Neg Amp H-M Lat  Site (ms) (ms) Norm (mV) (ms)  Left Tibial H-Reflex  Pop fossa 5.2 33.4  < 35.0 0.13 28.2   Electromyography   Side Muscle Ins.Act Fibs Fasc Recrt Amp Dur Poly Activation Comment  Left Tib ant Nml Nml Nml *1- *1+ *1+ Nml Nml N/A  Left Gastroc MH Nml Nml Nml Nml Nml Nml Nml Nml N/A  Left FDL Nml Nml Nml *1- *1+ *1+ Nml Nml N/A  Left Rectus fem Nml Nml Nml Nml Nml Nml Nml Nml N/A  Left Biceps fem SH Nml Nml Nml Nml Nml Nml Nml Nml N/A  Left Gluteus med Nml Nml Nml *1- *1+ *1+ Nml Nml N/A  Left Lumbar PSP lower Nml Nml Nml Nml Nml Nml Nml Nml N/A  Right Tib ant Nml Nml Nml *1- *1+ *1+ Nml Nml N/A  Right Gastroc MH Nml Nml Nml Nml Nml Nml Nml Nml N/A      Waveforms:  Motor      Sensory      H-Reflex

## 2022-08-19 ENCOUNTER — Encounter: Payer: Self-pay | Admitting: Neurology

## 2022-08-20 ENCOUNTER — Encounter: Payer: Self-pay | Admitting: Family Medicine

## 2022-08-21 DIAGNOSIS — E039 Hypothyroidism, unspecified: Secondary | ICD-10-CM | POA: Diagnosis not present

## 2022-08-21 DIAGNOSIS — M810 Age-related osteoporosis without current pathological fracture: Secondary | ICD-10-CM | POA: Diagnosis not present

## 2022-08-21 DIAGNOSIS — J3089 Other allergic rhinitis: Secondary | ICD-10-CM | POA: Diagnosis not present

## 2022-08-21 DIAGNOSIS — J301 Allergic rhinitis due to pollen: Secondary | ICD-10-CM | POA: Diagnosis not present

## 2022-08-21 DIAGNOSIS — E559 Vitamin D deficiency, unspecified: Secondary | ICD-10-CM | POA: Diagnosis not present

## 2022-08-21 DIAGNOSIS — J3081 Allergic rhinitis due to animal (cat) (dog) hair and dander: Secondary | ICD-10-CM | POA: Diagnosis not present

## 2022-08-25 ENCOUNTER — Ambulatory Visit
Admission: RE | Admit: 2022-08-25 | Discharge: 2022-08-25 | Disposition: A | Discharge status: Home / Self Care | Payer: Federal, State, Local not specified - PPO | Source: Ambulatory Visit | Attending: Family Medicine | Admitting: Family Medicine

## 2022-08-25 DIAGNOSIS — G8929 Other chronic pain: Secondary | ICD-10-CM

## 2022-08-25 DIAGNOSIS — M21372 Foot drop, left foot: Secondary | ICD-10-CM | POA: Diagnosis not present

## 2022-08-25 DIAGNOSIS — R200 Anesthesia of skin: Secondary | ICD-10-CM | POA: Diagnosis not present

## 2022-08-25 DIAGNOSIS — M545 Low back pain, unspecified: Secondary | ICD-10-CM | POA: Diagnosis not present

## 2022-08-27 ENCOUNTER — Ambulatory Visit (INDEPENDENT_AMBULATORY_CARE_PROVIDER_SITE_OTHER): Payer: PPO | Admitting: Family Medicine

## 2022-08-27 VITALS — BP 122/82 | HR 76 | Ht 64.0 in | Wt 140.0 lb

## 2022-08-27 DIAGNOSIS — M5416 Radiculopathy, lumbar region: Secondary | ICD-10-CM | POA: Diagnosis not present

## 2022-08-27 DIAGNOSIS — G8929 Other chronic pain: Secondary | ICD-10-CM

## 2022-08-27 DIAGNOSIS — M5442 Lumbago with sciatica, left side: Secondary | ICD-10-CM | POA: Diagnosis not present

## 2022-08-27 NOTE — Progress Notes (Signed)
[place] Phone: [phone] Subjective:   [name], am serving as a scribe for Dr. Hulan Saas.  I'm seeing this patient by the request  of:  Plotnikov, [name], MD  CC:   [id number]  08/05/2022 Patient is having left leg pain out of proportion.  Feels like she is having increasing instability and patient does have atrophy of the musculature.  Do feel ABI and nerve conduction study could be beneficial.  Patient has a history of the bulging disks previously.  Concerned that there is some lumbar radiculopathy with patient having some improvement with different positioning of her back more than her leg.  I do feel that advanced imaging with an MRI could be beneficial as well.  Discussed which activities to do and which ones to avoid.  Follow-up with me again after all the tests including the nerve conduction study and ABIs to further evaluate.  Patient knows of worsening weakness or pain to seek medical attention immediately.    Update 08/27/2022 [name] is a 67 y.o. female coming in with complaint of LBP. Patient states that she has tightness daily in lumbar spine. Pain 2/10. Also continues to have L knee pain and spasming of L hamstring at night. Feels weak when she is walking on uneven surfaces. Would like to discuss plan after EMG and MRI results.  Patient also had a nerve conduction study showing that there is weakness in the elbow 5 aspect of the anterior tibialis left greater than right but bilateral MRI lumbar 08/25/2022 IMPRESSION: 1. No acute findings or explanation for the patient's symptoms. 2. Minimal residual central disc protrusion at L2-3 without resulting spinal stenosis or nerve root encroachment.    Past Medical History:  Diagnosis Date   ALLERGIC RHINITIS 03/02/2007   Anemia    ANXIETY 03/02/2007   Arthritis    ASTHMA 11/12/2007   Dr Orvil Feil   Cancer [facility])    skin, hx of    Dysrhythmia    pt. states at night will notice a different heart rhythem   Endometrial cancer ([facility])    [place], HX OF 03/02/2007   GERD 03/02/2007   HYPOTHYROIDISM 11/12/2007   Dr Chalmers Cater   Neuromuscular disorder ([place])    carpel tunnel syndrome, compression of ulner nerve at elbows   OSTEOPENIA 11/12/2007   PEPTIC ULCER DISEASE 03/02/2007   Pneumonia    hx. of   Radiation 09/27/15-10/23/15   HDR to vaginal cuff 30 Gy   Stress fracture    TMJ SYNDROME 11/12/2007   Past Surgical History:  Procedure Laterality Date   2008 TMJ surgery      CARPAL TUNNEL WITH CUBITAL TUNNEL Left 07/2019   carpel tunnel Right 11/20/2016   colonoscopy x 2     cubital tunnel release Right 11/20/2016   deviated septum surgery - 2007     DILATION AND CURETTAGE OF UTERUS     2017   endoscopy - 1990     Mandib advancement forward  2009   ROBOTIC ASSISTED TOTAL HYSTERECTOMY WITH BILATERAL SALPINGO OOPHERECTOMY Bilateral 08/14/2015   Procedure: XI ROBOTIC ASSISTED TOTAL HYSTERECTOMY WITH BILATERAL SALPINGO OOPHORECTOMY AND SENTAL LYMPH NODE BIOPSY;  Surgeon: Everitt Amber, MD;  Location: [facility];  Service: Gynecology;  Laterality: Bilateral;   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     Social History   Socioeconomic History   Marital status: Married    Spouse  name: Not on file   Number of children: Not on file   Years of education: Not on file   Highest education level: Not on file  Occupational History   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[employer]  Tobacco Use   Smoking status: Never   Smokeless tobacco: Never   Tobacco comments:    Married, 1 child  Vaping Use   Vaping Use: Never used  Substance and Sexual Activity   Alcohol use: Yes    Alcohol/week: 2.0 standard drinks of alcohol    Types: 1 Glasses of wine, 1 Cans of beer per week    Comment: daily   Drug use: No   Sexual activity: Yes  Other Topics Concern   Not on file  Social History Narrative   Not on file    Social Determinants of Health   Financial Resource Strain: Not on file  Food Insecurity: Not on file  Transportation Needs: Not on file  Physical Activity: Not on file  Stress: Not on file  Social Connections: Not on file   Allergies  Allergen Reactions   Ibuprofen Other (See Comments)    Gastritis    Shellfish Allergy Other (See Comments)    Abdominal pain and Diarrhea   Colchicine     diarrhea   Astroglide [Gyne-Moistrin] Other (See Comments)    Itching and burning   Demerol Nausea And Vomiting   Penicillins Hives and Other (See Comments)    Face turned red and had a headache. Has patient had a PCN reaction causing immediate rash, facial/tongue/throat swelling, SOB or lightheadedness with hypotension: no Has patient had a PCN reaction causing severe rash involving mucus membranes or skin necrosis: no Has patient had a PCN reaction that required hospitalization: no Has patient had a PCN reaction occurring within the last 10 years: no If all of the above answers are "NO", then may proceed with Cephalosporin use.   Family History  Problem Relation Age of Onset   Hypertension Father    Heart disease Father 8       chf   Heart disease Mother 34       CAD   Hypertension Other    Asthma Neg Hx     Current Outpatient Medications (Endocrine & Metabolic):    levothyroxine (SYNTHROID, LEVOTHROID) 25 MCG tablet, Take 25-50 mcg by mouth daily. She takes one tablet daily 4 days per week (Monday-Thursday) and two tablets daily 3 days per week (Friday-Sunday).   liothyronine (CYTOMEL) 5 MCG tablet, Take 5-10 mcg by mouth 2 (two) times daily. She takes one tablet in the morning and two tablets midday.   zoledronic acid (RECLAST) 5 MG/100ML SOLN injection, Inject 5 mg into the vein. Once a year  Current Outpatient Medications (Cardiovascular):    EPIPEN 2-PAK 0.3 MG/0.3ML DEVI, Inject 0.3 mg into the muscle once. Reported on 09/03/2015  Current Outpatient Medications (Respiratory):     albuterol (VENTOLIN HFA) 108 (90 Base) MCG/ACT inhaler, Inhale 2 puffs into the lungs every 6 (six) hours as needed for wheezing or shortness of breath.   cetirizine (ZYRTEC) 5 MG tablet, Take 5 mg by mouth 2 (two) times daily as needed for allergies.   fluticasone (FLONASE) 50 MCG/ACT nasal spray, Place 1 spray into the nose daily as needed for allergies. Reported on 08/06/2015    Current Outpatient Medications (Other):    Calcium Citrate-Vitamin D (CALCIUM CITRATE +D PO), Take 2 tablets by mouth daily.    Diclofenac Sodium 2 % SOLN, Place 2 g  onto the skin 2 (two) times daily.   estradiol (ESTRACE) 0.1 MG/GM vaginal cream, PLACE 1 APPLICATORFUL VAGINALLY 3 (THREE) TIMES A WEEK   MELATONIN-CHAMOMILE PO, Take 1 capsule by mouth at bedtime as needed and may repeat dose one time if needed (For sleep.).    METRONIDAZOLE, TOPICAL, 0.75 % LOTN, Apply 1 application topically at bedtime.   minoxidil (ROGAINE) 2 % external solution, Apply 1 application topically 4 (four) times a week.    Multiple Vitamin (MULTIVITAMIN WITH MINERALS) TABS tablet, Take 1 tablet by mouth daily.   venlafaxine XR (EFFEXOR-XR) 37.5 MG 24 hr capsule, TAKE 1 CAPSULE BY MOUTH EVERY DAY WITH BREAKFAST (Patient taking differently: Take 37.5 mg by mouth. 3 times a week)   pyridoxine (B-6) 200 MG tablet, Take 200 mg by mouth daily.   Reviewed prior external information including notes and imaging from  primary care provider As well as notes that were available from care everywhere and other healthcare systems.  Past medical history, social, surgical and family history all reviewed in electronic medical record.  No pertanent information unless stated regarding to the chief complaint.   Review of Systems:  No headache, visual changes, nausea, vomiting, diarrhea, constipation, dizziness, abdominal pain, skin rash, fevers, chills, night sweats, weight loss, swollen lymph nodes, body aches, joint swelling, chest pain, shortness of  breath, mood changes. POSITIVE muscle aches  Objective  Blood pressure 122/82, pulse 76, height '5\' 4"'$  (1.626 m), weight 140 lb (63.5 kg), SpO2 98 %.   General: No apparent distress alert and oriented x3 mood and affect normal, dressed appropriately.  HEENT: Pupils equal, extraocular movements intact  Respiratory: Patient's speak in full sentences and does not appear short of breath  Cardiovascular: No lower extremity edema, non tender, no erythema  Low back does have some loss of lordosis still noted.  Tightness with straight leg test on the left compared to the right.  Patient does have weakness noted in dorsiflexion of the left foot with 3 out of 5 strength.  Deep tendon reflexes does seem to be okay    Impression and Recommendations:      The above documentation has been reviewed and is accurate and complete Lyndal Pulley, DO

## 2022-08-27 NOTE — Patient Instructions (Addendum)
Good to see you MBB L4/L5 and L5/S1 Will not help with strength but will help w sensation Then we will see if you are candidate for RFA See me again in 6 weeks

## 2022-08-27 NOTE — Assessment & Plan Note (Signed)
Patient is having the low back pain again that is seems to be out of proportion.  Concern for potentially reflex of the bone dystrophy at this time.  Patient's nerve conduction study does show that there is a bilateral chronic L5 neuropathy that is mild in degree and patient does have a very small disc protrusion at this level.  Patient has had chronic pain with this and does have weakness noted of the foot.  We discussed with patient weakness in the left leg pain different options.  Facet injections versus the possibility of medial branch block.  Because patient does have it intermittently and both legs as well.  She is elected to try medial branch blocks and for L5 and 5 S1 bilaterally.  Depending on patient's response we can see if she is a seen ablation.  We discussed that this will help her with the sensation but not help her with the weakness.  Patient understands all this.  6 weeks or 4 weeks after the injections if patient is a candidate.  Total time reviewing patient's MRI today as well as discussing different treatment

## 2022-08-28 ENCOUNTER — Other Ambulatory Visit: Payer: Self-pay | Admitting: Family Medicine

## 2022-08-28 DIAGNOSIS — J301 Allergic rhinitis due to pollen: Secondary | ICD-10-CM | POA: Diagnosis not present

## 2022-08-28 DIAGNOSIS — J3089 Other allergic rhinitis: Secondary | ICD-10-CM | POA: Diagnosis not present

## 2022-08-28 DIAGNOSIS — M5416 Radiculopathy, lumbar region: Secondary | ICD-10-CM

## 2022-09-01 ENCOUNTER — Encounter: Payer: Federal, State, Local not specified - PPO | Admitting: Neurology

## 2022-09-02 ENCOUNTER — Encounter: Payer: Self-pay | Admitting: Family Medicine

## 2022-09-03 ENCOUNTER — Other Ambulatory Visit: Payer: Self-pay

## 2022-09-03 DIAGNOSIS — M5416 Radiculopathy, lumbar region: Secondary | ICD-10-CM

## 2022-09-04 DIAGNOSIS — J3081 Allergic rhinitis due to animal (cat) (dog) hair and dander: Secondary | ICD-10-CM | POA: Diagnosis not present

## 2022-09-04 DIAGNOSIS — J301 Allergic rhinitis due to pollen: Secondary | ICD-10-CM | POA: Diagnosis not present

## 2022-09-04 DIAGNOSIS — J3089 Other allergic rhinitis: Secondary | ICD-10-CM | POA: Diagnosis not present

## 2022-09-05 DIAGNOSIS — M25562 Pain in left knee: Secondary | ICD-10-CM | POA: Diagnosis not present

## 2022-09-05 DIAGNOSIS — R269 Unspecified abnormalities of gait and mobility: Secondary | ICD-10-CM | POA: Diagnosis not present

## 2022-09-08 ENCOUNTER — Other Ambulatory Visit: Payer: Self-pay | Admitting: Family Medicine

## 2022-09-08 ENCOUNTER — Ambulatory Visit
Admission: RE | Admit: 2022-09-08 | Discharge: 2022-09-08 | Disposition: A | Discharge status: Home / Self Care | Payer: PPO | Source: Ambulatory Visit | Attending: Family Medicine | Admitting: Family Medicine

## 2022-09-08 DIAGNOSIS — M5416 Radiculopathy, lumbar region: Secondary | ICD-10-CM

## 2022-09-08 DIAGNOSIS — M47817 Spondylosis without myelopathy or radiculopathy, lumbosacral region: Secondary | ICD-10-CM | POA: Diagnosis not present

## 2022-09-08 NOTE — Discharge Instructions (Signed)
Medial Branch Block Discharge Instructions  Take over-the-counter and prescription medicines only as told by your health care provider.  Do not drive the day of your procedure  Return to your normal activities as told by your health care provider.   If injection site is sore you may ice the area for 20 minutes, 2-3 times a day.   Check your injection site every day for signs of infection. Check for: Redness, swelling, or pain. Fluid or blood. Warmth. Pus or a bad smell.  Please contact our office at [phone] if: You have a fever or chills. You have any signs of infection. You develop any numbness or weakness.   Thank you for visiting our office.  

## 2022-09-09 ENCOUNTER — Encounter: Payer: Self-pay | Admitting: Family Medicine

## 2022-09-12 DIAGNOSIS — J301 Allergic rhinitis due to pollen: Secondary | ICD-10-CM | POA: Diagnosis not present

## 2022-09-12 DIAGNOSIS — J3089 Other allergic rhinitis: Secondary | ICD-10-CM | POA: Diagnosis not present

## 2022-09-16 ENCOUNTER — Inpatient Hospital Stay: Admission: RE | Admit: 2022-09-16 | Payer: PPO | Source: Ambulatory Visit

## 2022-09-19 DIAGNOSIS — H5213 Myopia, bilateral: Secondary | ICD-10-CM | POA: Diagnosis not present

## 2022-09-19 DIAGNOSIS — H00024 Hordeolum internum left upper eyelid: Secondary | ICD-10-CM | POA: Diagnosis not present

## 2022-09-22 ENCOUNTER — Encounter: Payer: Federal, State, Local not specified - PPO | Admitting: Internal Medicine

## 2022-09-29 ENCOUNTER — Ambulatory Visit
Admission: RE | Admit: 2022-09-29 | Discharge: 2022-09-29 | Disposition: A | Discharge status: Home / Self Care | Payer: PPO | Source: Ambulatory Visit | Attending: Family Medicine | Admitting: Family Medicine

## 2022-09-29 ENCOUNTER — Other Ambulatory Visit: Payer: Self-pay | Admitting: Family Medicine

## 2022-09-29 ENCOUNTER — Ambulatory Visit (INDEPENDENT_AMBULATORY_CARE_PROVIDER_SITE_OTHER): Payer: PPO | Admitting: Internal Medicine

## 2022-09-29 ENCOUNTER — Encounter: Payer: Self-pay | Admitting: Internal Medicine

## 2022-09-29 VITALS — BP 110/78 | HR 60 | Temp 98.2°F | Ht 64.0 in | Wt 143.0 lb

## 2022-09-29 DIAGNOSIS — M810 Age-related osteoporosis without current pathological fracture: Secondary | ICD-10-CM | POA: Diagnosis not present

## 2022-09-29 DIAGNOSIS — R202 Paresthesia of skin: Secondary | ICD-10-CM | POA: Diagnosis not present

## 2022-09-29 DIAGNOSIS — M5442 Lumbago with sciatica, left side: Secondary | ICD-10-CM

## 2022-09-29 DIAGNOSIS — F411 Generalized anxiety disorder: Secondary | ICD-10-CM | POA: Diagnosis not present

## 2022-09-29 DIAGNOSIS — Z0000 Encounter for general adult medical examination without abnormal findings: Secondary | ICD-10-CM | POA: Diagnosis not present

## 2022-09-29 DIAGNOSIS — M545 Low back pain, unspecified: Secondary | ICD-10-CM | POA: Diagnosis not present

## 2022-09-29 DIAGNOSIS — G8929 Other chronic pain: Secondary | ICD-10-CM | POA: Diagnosis not present

## 2022-09-29 DIAGNOSIS — G4700 Insomnia, unspecified: Secondary | ICD-10-CM

## 2022-09-29 DIAGNOSIS — M5416 Radiculopathy, lumbar region: Secondary | ICD-10-CM

## 2022-09-29 MED ORDER — ZOLPIDEM TARTRATE 5 MG PO TABS: 5.0000 mg | ORAL_TABLET | Freq: Every evening | ORAL | 1 refills | Status: DC | PRN

## 2022-09-29 MED ORDER — TRAMADOL HCL 50 MG PO TABS: 50.0000 mg | ORAL_TABLET | Freq: Three times a day (TID) | ORAL | 1 refills | Status: AC | PRN

## 2022-09-29 MED ORDER — METHOCARBAMOL 500 MG PO TABS: 500.0000 mg | ORAL_TABLET | Freq: Three times a day (TID) | ORAL | 2 refills | Status: DC | PRN

## 2022-09-29 MED ORDER — MIDAZOLAM HCL 2 MG/2ML IJ SOLN
1.0000 mg | INTRAMUSCULAR | Status: DC | PRN
  Administered 2022-09-29 (×2): 0.5 mg via INTRAVENOUS
  Administered 2022-09-29: 1 mg via INTRAVENOUS
  Administered 2022-09-29 (×2): 0.5 mg via INTRAVENOUS
  Administered 2022-09-29: 1 mg via INTRAVENOUS

## 2022-09-29 MED ORDER — SODIUM CHLORIDE 0.9 % IV SOLN: INTRAVENOUS | Status: DC

## 2022-09-29 MED ORDER — FENTANYL CITRATE PF 50 MCG/ML IJ SOSY
25.0000 ug | PREFILLED_SYRINGE | INTRAMUSCULAR | Status: DC | PRN
  Administered 2022-09-29 (×5): 25 ug via INTRAVENOUS

## 2022-09-29 MED ORDER — KETOROLAC TROMETHAMINE 30 MG/ML IJ SOLN
30.0000 mg | Freq: Once | INTRAMUSCULAR | Status: AC
  Administered 2022-09-29: 30 mg via INTRAVENOUS

## 2022-09-29 NOTE — Assessment & Plan Note (Addendum)
Chronic.  Ablation today Prescribed methocarbamol as needed, tramadol as needed for severe pain.  Potential benefits of a long term opioids use as well as potential risks (i.e. addiction risk, apnea etc) and complications (i.e. Somnolence, constipation and others) were explained to the patient and were aknowledged. Continue with exercise for range of motion Blue-Emu cream was recommended to use 2-3 times a day

## 2022-09-29 NOTE — Discharge Instructions (Signed)
Radio Frequency Ablation Post Procedure Discharge Instructions ? ?May resume a regular diet and any medications that you routinely take (including pain medications). ?No driving day of procedure. ?Upon discharge go home and rest for at least 4 hours.  May use an ice pack as needed to injection sites on back. ?Remove bandades later, today. ? ? ? ?Please contact our office at [phone] for the following symptoms: ? ?Fever greater than 100 degrees ?Increased swelling, pain, or redness at injection site. ? ? ?Thank you for visiting [facility].  ?

## 2022-09-29 NOTE — Progress Notes (Signed)
Pt back in nursing recovery area. Pt still drowsy from procedure but will wake up when spoken to. Pt follows commands, talks in complete sentences and has no complaints at this time. Pt will remain in nurses station until discharged by Radiologist.   

## 2022-09-29 NOTE — Progress Notes (Signed)
Subjective:  Patient ID: [name], female    DOB: 06/26/1956  Age: 67 y.o. MRN: [id number]  CC: Annual Exam (PHYSICAL)   HPI [name] presents for a well exam Pt is seeing L leg weakness; pt fell twice - seeing Dr Tamala Julian C/o LBP, knee pain L>R L knee pain 5-6/10 C/o insomnia, constipation  Outpatient Medications Prior to Visit  Medication Sig Dispense Refill   ARNUITY ELLIPTA 100 MCG/ACT AEPB Inhale 1 puff into the lungs daily.     oxyCODONE (OXY IR/ROXICODONE) 5 MG immediate release tablet 5 mg every 4 (four) hours as needed.     tobramycin-dexamethasone (TOBRADEX) ophthalmic solution Place 1 drop into the left eye 4 (four) times daily.     albuterol (VENTOLIN HFA) 108 (90 Base) MCG/ACT inhaler Inhale 2 puffs into the lungs every 6 (six) hours as needed for wheezing or shortness of breath. 8 g 0   Calcium Citrate-Vitamin D (CALCIUM CITRATE +D PO) Take 2 tablets by mouth daily.      cetirizine (ZYRTEC) 5 MG tablet Take 5 mg by mouth 2 (two) times daily as needed for allergies.     Diclofenac Sodium 2 % SOLN Place 2 g onto the skin 2 (two) times daily. 112 g 3   EPIPEN 2-PAK 0.3 MG/0.3ML DEVI Inject 0.3 mg into the muscle once. Reported on 09/03/2015     estradiol (ESTRACE) 0.1 MG/GM vaginal cream PLACE 1 APPLICATORFUL VAGINALLY 3 (THREE) TIMES A WEEK 42.5 g 12   fluticasone (FLONASE) 50 MCG/ACT nasal spray Place 1 spray into the nose daily as needed for allergies. Reported on 08/06/2015     levothyroxine (SYNTHROID, LEVOTHROID) 25 MCG tablet Take 25-50 mcg by mouth daily. She takes one tablet daily 4 days per week (Monday-Thursday) and two tablets daily 3 days per week (Friday-Sunday).     liothyronine (CYTOMEL) 5 MCG tablet Take 5-10 mcg by mouth 2 (two) times daily. She takes one tablet in the morning and two tablets midday.     MELATONIN-CHAMOMILE PO Take 1 capsule by mouth at bedtime as needed and may repeat dose one time if needed (For sleep.).      METRONIDAZOLE, TOPICAL,  0.75 % LOTN Apply 1 application topically at bedtime.     minoxidil (ROGAINE) 2 % external solution Apply 1 application topically 4 (four) times a week.      montelukast (SINGULAIR) 10 MG tablet Take 10 mg by mouth daily.     Multiple Vitamin (MULTIVITAMIN WITH MINERALS) TABS tablet Take 1 tablet by mouth daily.     pyridoxine (B-6) 200 MG tablet Take 200 mg by mouth daily.     venlafaxine XR (EFFEXOR-XR) 37.5 MG 24 hr capsule TAKE 1 CAPSULE BY MOUTH EVERY DAY WITH BREAKFAST (Patient taking differently: Take 37.5 mg by mouth. 3 times a week) 90 capsule 6   zoledronic acid (RECLAST) 5 MG/100ML SOLN injection Inject 5 mg into the vein. Once a year     No facility-administered medications prior to visit.    ROS: Review of Systems  Constitutional:  Negative for activity change, appetite change, chills, fatigue and unexpected weight change.  HENT:  Negative for congestion, mouth sores and sinus pressure.   Eyes:  Negative for visual disturbance.  Respiratory:  Negative for cough and chest tightness.   Gastrointestinal:  Negative for abdominal pain and nausea.  Genitourinary:  Negative for difficulty urinating, frequency and vaginal pain.  Musculoskeletal:  Positive for arthralgias, back pain and gait problem.  Skin:  Negative for pallor and rash.  Neurological:  Negative for dizziness, tremors, weakness, numbness and headaches.  Psychiatric/Behavioral:  Negative for confusion, sleep disturbance and suicidal ideas. The patient is nervous/anxious.     Objective:  BP 110/78 (BP Location: Left Arm, Patient Position: Sitting, Cuff Size: Large)   Pulse 60   Temp 98.2 F (36.8 C) (Oral)   Ht 5\' 4"  (1.626 m)   Wt 143 lb (64.9 kg)   SpO2 98%   BMI 24.55 kg/m   BP Readings from Last 3 Encounters:  09/29/22 131/72  09/29/22 110/78  09/08/22 132/72    Wt Readings from Last 3 Encounters:  09/29/22 143 lb (64.9 kg)  08/27/22 140 lb (63.5 kg)  08/05/22 139 lb (63 kg)    Physical  Exam Constitutional:      General: She is not in acute distress.    Appearance: Normal appearance. She is well-developed.  HENT:     Head: Normocephalic.     Right Ear: External ear normal.     Left Ear: External ear normal.     Nose: Nose normal.  Eyes:     General:        Right eye: No discharge.        Left eye: No discharge.     Conjunctiva/sclera: Conjunctivae normal.     Pupils: Pupils are equal, round, and reactive to light.  Neck:     Thyroid: No thyromegaly.     Vascular: No JVD.     Trachea: No tracheal deviation.  Cardiovascular:     Rate and Rhythm: Normal rate and regular rhythm.     Heart sounds: Normal heart sounds.  Pulmonary:     Effort: No respiratory distress.     Breath sounds: No stridor. No wheezing.  Abdominal:     General: Bowel sounds are normal. There is no distension.     Palpations: Abdomen is soft. There is no mass.     Tenderness: There is no abdominal tenderness. There is no guarding or rebound.  Musculoskeletal:        General: Tenderness present.     Cervical back: Normal range of motion and neck supple. No rigidity.     Right lower leg: No edema.     Left lower leg: No edema.  Lymphadenopathy:     Cervical: No cervical adenopathy.  Skin:    Findings: No erythema or rash.  Neurological:     Mental Status: She is oriented to person, place, and time.     Cranial Nerves: No cranial nerve deficit.     Motor: No abnormal muscle tone.     Coordination: Coordination normal.     Deep Tendon Reflexes: Reflexes normal.  Psychiatric:        Behavior: Behavior normal.        Thought Content: Thought content normal.        Judgment: Judgment normal.   Tearfull  Knees w/pain L>R  LS w/pain [name] is tearful   Lab Results  Component Value Date   WBC 5.7 09/18/2021   HGB 13.4 09/18/2021   HCT 39.4 09/18/2021   PLT 205.0 09/18/2021   GLUCOSE 93 09/18/2021   CHOL 253 (H) 09/18/2021   TRIG 106.0 09/18/2021   HDL 104.40 09/18/2021    LDLDIRECT 102.8 11/11/2010   LDLCALC 128 (H) 09/18/2021   ALT 18 09/18/2021   AST 18 09/18/2021   NA 134 (L) 09/18/2021   K 3.5 09/18/2021   CL 99 09/18/2021  CREATININE 0.75 09/18/2021   BUN 19 09/18/2021   CO2 28 09/18/2021   TSH 0.74 09/18/2021    DG FACET JT INJ L /S SINGLE LEVEL RIGHT W/FL/CT  Addendum Date: 09/08/2022   ADDENDUM REPORT: 09/08/2022 15:55 ADDENDUM: I called the patient at 3:45 p.m., approximately 6 hours after the procedure. She has experienced approximately 90% relief in her facet mediated left lower back and buttock pain since the procedure. She has not experienced any improvement in her right-sided symptoms. She is therefore a good candidate for left-sided L4-L5 and L5-S1 facet radiofrequency ablation and will be scheduled at her earliest convenience. Electronically Signed   By: Obie Dredge M.D.   On: 09/08/2022 15:55   Result Date: 09/08/2022 CLINICAL DATA:  Lumbosacral spondylosis. Left-sided low back and posterior knee pain. Bilateral plantar foot numbness and tingling. Bilateral L4-L5 and L5-S1 facet medial branch blocks requested. EXAM: FLUOROSCOPICALLY GUIDED BILATERAL L3 AND L4 MEDIAL BRANCH BLOCKS AND BILATERAL L5 DORSAL RAMUS BLOCKS, IN PREPARATION FOR BILATERAL L4-L5 AND L5-S1 FACET RADIOFREQUENCY ABLATION FLUOROSCOPY TIME:  Radiation Exposure Index (as provided by the fluoroscopic device): 5.7 mGy Kerma TECHNIQUE: The procedure, risks, benefits, and alternatives were explained to the patient. Questions regarding the procedure were encouraged and answered. The patient understands and consents to the procedure. An appropriate skin entry site was determined fluoroscopically and marked. Site prepped with betadine, draped in usual sterile fashion, and infiltrated locally with 1% lidocaine. BILATERAL L3 MEDIAL BRANCH BLOCKS: A posterior oblique approach was taken to the junction of the superior articular process and transverse process on each side at L4 using 3.5  inch 22 gauge spinal needles, to lie along the course of the bilateral L3 medial branch nerves. 0.5 mL of 0.5% bupivacaine were injected. BILATERAL L4 MEDIAL BRANCH BLOCKS: A posterior oblique approach was taken to the junction of the superior articular process and transverse process on each side at L5 using 3.5 inch 22 gauge spinal needles, to lie along the course of the bilateral L4 medial branch nerves. 0.5 mL of 0.5% bupivacaine were injected. BILATERAL L5 DORSAL RAMUS BLOCKS: A posterior oblique approach was taken to the junction of the S1 superior articular process and sacral ala on each side using 3.5 inch 22 gauge spinal needles, to lie along the course of the bilateral L5 dorsal rami. 0.5 mL of 0.5% bupivacaine were injected. The procedure was well-tolerated. IMPRESSION: 1. Technically successful medial branch/dorsal ramus blocks in preparation for bilateral L4-L5 and L5-S1 facet radiofrequency ablation. I will call the patient later today to assess for durable and sustained pain relief. An addendum will be placed at that time. Electronically Signed: By: Obie Dredge M.D. On: 09/08/2022 10:56   DG FACET JT INJ L /S SINGLE LEVEL LEFT W/FL/CT  Addendum Date: 09/08/2022   ADDENDUM REPORT: 09/08/2022 15:55 ADDENDUM: I called the patient at 3:45 p.m., approximately 6 hours after the procedure. She has experienced approximately 90% relief in her facet mediated left lower back and buttock pain since the procedure. She has not experienced any improvement in her right-sided symptoms. She is therefore a good candidate for left-sided L4-L5 and L5-S1 facet radiofrequency ablation and will be scheduled at her earliest convenience. Electronically Signed   By: Obie Dredge M.D.   On: 09/08/2022 15:55   Result Date: 09/08/2022 CLINICAL DATA:  Lumbosacral spondylosis. Left-sided low back and posterior knee pain. Bilateral plantar foot numbness and tingling. Bilateral L4-L5 and L5-S1 facet medial branch blocks  requested. EXAM: FLUOROSCOPICALLY GUIDED BILATERAL L3 AND L4  MEDIAL BRANCH BLOCKS AND BILATERAL L5 DORSAL RAMUS BLOCKS, IN PREPARATION FOR BILATERAL L4-L5 AND L5-S1 FACET RADIOFREQUENCY ABLATION FLUOROSCOPY TIME:  Radiation Exposure Index (as provided by the fluoroscopic device): 5.7 mGy Kerma TECHNIQUE: The procedure, risks, benefits, and alternatives were explained to the patient. Questions regarding the procedure were encouraged and answered. The patient understands and consents to the procedure. An appropriate skin entry site was determined fluoroscopically and marked. Site prepped with betadine, draped in usual sterile fashion, and infiltrated locally with 1% lidocaine. BILATERAL L3 MEDIAL BRANCH BLOCKS: A posterior oblique approach was taken to the junction of the superior articular process and transverse process on each side at L4 using 3.5 inch 22 gauge spinal needles, to lie along the course of the bilateral L3 medial branch nerves. 0.5 mL of 0.5% bupivacaine were injected. BILATERAL L4 MEDIAL BRANCH BLOCKS: A posterior oblique approach was taken to the junction of the superior articular process and transverse process on each side at L5 using 3.5 inch 22 gauge spinal needles, to lie along the course of the bilateral L4 medial branch nerves. 0.5 mL of 0.5% bupivacaine were injected. BILATERAL L5 DORSAL RAMUS BLOCKS: A posterior oblique approach was taken to the junction of the S1 superior articular process and sacral ala on each side using 3.5 inch 22 gauge spinal needles, to lie along the course of the bilateral L5 dorsal rami. 0.5 mL of 0.5% bupivacaine were injected. The procedure was well-tolerated. IMPRESSION: 1. Technically successful medial branch/dorsal ramus blocks in preparation for bilateral L4-L5 and L5-S1 facet radiofrequency ablation. I will call the patient later today to assess for durable and sustained pain relief. An addendum will be placed at that time. Electronically Signed: By: Obie Dredge M.D. On: 09/08/2022 10:56   DG FACET JT INJ L /S 2ND LEVEL RIGHT W/FL/CT  Addendum Date: 09/08/2022   ADDENDUM REPORT: 09/08/2022 15:55 ADDENDUM: I called the patient at 3:45 p.m., approximately 6 hours after the procedure. She has experienced approximately 90% relief in her facet mediated left lower back and buttock pain since the procedure. She has not experienced any improvement in her right-sided symptoms. She is therefore a good candidate for left-sided L4-L5 and L5-S1 facet radiofrequency ablation and will be scheduled at her earliest convenience. Electronically Signed   By: Obie Dredge M.D.   On: 09/08/2022 15:55   Result Date: 09/08/2022 CLINICAL DATA:  Lumbosacral spondylosis. Left-sided low back and posterior knee pain. Bilateral plantar foot numbness and tingling. Bilateral L4-L5 and L5-S1 facet medial branch blocks requested. EXAM: FLUOROSCOPICALLY GUIDED BILATERAL L3 AND L4 MEDIAL BRANCH BLOCKS AND BILATERAL L5 DORSAL RAMUS BLOCKS, IN PREPARATION FOR BILATERAL L4-L5 AND L5-S1 FACET RADIOFREQUENCY ABLATION FLUOROSCOPY TIME:  Radiation Exposure Index (as provided by the fluoroscopic device): 5.7 mGy Kerma TECHNIQUE: The procedure, risks, benefits, and alternatives were explained to the patient. Questions regarding the procedure were encouraged and answered. The patient understands and consents to the procedure. An appropriate skin entry site was determined fluoroscopically and marked. Site prepped with betadine, draped in usual sterile fashion, and infiltrated locally with 1% lidocaine. BILATERAL L3 MEDIAL BRANCH BLOCKS: A posterior oblique approach was taken to the junction of the superior articular process and transverse process on each side at L4 using 3.5 inch 22 gauge spinal needles, to lie along the course of the bilateral L3 medial branch nerves. 0.5 mL of 0.5% bupivacaine were injected. BILATERAL L4 MEDIAL BRANCH BLOCKS: A posterior oblique approach was taken to the junction of the  superior articular process  and transverse process on each side at L5 using 3.5 inch 22 gauge spinal needles, to lie along the course of the bilateral L4 medial branch nerves. 0.5 mL of 0.5% bupivacaine were injected. BILATERAL L5 DORSAL RAMUS BLOCKS: A posterior oblique approach was taken to the junction of the S1 superior articular process and sacral ala on each side using 3.5 inch 22 gauge spinal needles, to lie along the course of the bilateral L5 dorsal rami. 0.5 mL of 0.5% bupivacaine were injected. The procedure was well-tolerated. IMPRESSION: 1. Technically successful medial branch/dorsal ramus blocks in preparation for bilateral L4-L5 and L5-S1 facet radiofrequency ablation. I will call the patient later today to assess for durable and sustained pain relief. An addendum will be placed at that time. Electronically Signed: By: Obie Dredge M.D. On: 09/08/2022 10:56   DG FACET JT INJ L /S 2ND LEVEL RIGHT W/FL/CT  Addendum Date: 09/08/2022   ADDENDUM REPORT: 09/08/2022 15:55 ADDENDUM: I called the patient at 3:45 p.m., approximately 6 hours after the procedure. She has experienced approximately 90% relief in her facet mediated left lower back and buttock pain since the procedure. She has not experienced any improvement in her right-sided symptoms. She is therefore a good candidate for left-sided L4-L5 and L5-S1 facet radiofrequency ablation and will be scheduled at her earliest convenience. Electronically Signed   By: Obie Dredge M.D.   On: 09/08/2022 15:55   Result Date: 09/08/2022 CLINICAL DATA:  Lumbosacral spondylosis. Left-sided low back and posterior knee pain. Bilateral plantar foot numbness and tingling. Bilateral L4-L5 and L5-S1 facet medial branch blocks requested. EXAM: FLUOROSCOPICALLY GUIDED BILATERAL L3 AND L4 MEDIAL BRANCH BLOCKS AND BILATERAL L5 DORSAL RAMUS BLOCKS, IN PREPARATION FOR BILATERAL L4-L5 AND L5-S1 FACET RADIOFREQUENCY ABLATION FLUOROSCOPY TIME:  Radiation Exposure Index  (as provided by the fluoroscopic device): 5.7 mGy Kerma TECHNIQUE: The procedure, risks, benefits, and alternatives were explained to the patient. Questions regarding the procedure were encouraged and answered. The patient understands and consents to the procedure. An appropriate skin entry site was determined fluoroscopically and marked. Site prepped with betadine, draped in usual sterile fashion, and infiltrated locally with 1% lidocaine. BILATERAL L3 MEDIAL BRANCH BLOCKS: A posterior oblique approach was taken to the junction of the superior articular process and transverse process on each side at L4 using 3.5 inch 22 gauge spinal needles, to lie along the course of the bilateral L3 medial branch nerves. 0.5 mL of 0.5% bupivacaine were injected. BILATERAL L4 MEDIAL BRANCH BLOCKS: A posterior oblique approach was taken to the junction of the superior articular process and transverse process on each side at L5 using 3.5 inch 22 gauge spinal needles, to lie along the course of the bilateral L4 medial branch nerves. 0.5 mL of 0.5% bupivacaine were injected. BILATERAL L5 DORSAL RAMUS BLOCKS: A posterior oblique approach was taken to the junction of the S1 superior articular process and sacral ala on each side using 3.5 inch 22 gauge spinal needles, to lie along the course of the bilateral L5 dorsal rami. 0.5 mL of 0.5% bupivacaine were injected. The procedure was well-tolerated. IMPRESSION: 1. Technically successful medial branch/dorsal ramus blocks in preparation for bilateral L4-L5 and L5-S1 facet radiofrequency ablation. I will call the patient later today to assess for durable and sustained pain relief. An addendum will be placed at that time. Electronically Signed: By: Obie Dredge M.D. On: 09/08/2022 10:56   DG FACET JT INJ L /S  2ND LEVEL LEFT W/FL/CT  Addendum Date: 09/08/2022   ADDENDUM  REPORT: 09/08/2022 15:55 ADDENDUM: I called the patient at 3:45 p.m., approximately 6 hours after the procedure. She  has experienced approximately 90% relief in her facet mediated left lower back and buttock pain since the procedure. She has not experienced any improvement in her right-sided symptoms. She is therefore a good candidate for left-sided L4-L5 and L5-S1 facet radiofrequency ablation and will be scheduled at her earliest convenience. Electronically Signed   By: Obie Dredge M.D.   On: 09/08/2022 15:55   Result Date: 09/08/2022 CLINICAL DATA:  Lumbosacral spondylosis. Left-sided low back and posterior knee pain. Bilateral plantar foot numbness and tingling. Bilateral L4-L5 and L5-S1 facet medial branch blocks requested. EXAM: FLUOROSCOPICALLY GUIDED BILATERAL L3 AND L4 MEDIAL BRANCH BLOCKS AND BILATERAL L5 DORSAL RAMUS BLOCKS, IN PREPARATION FOR BILATERAL L4-L5 AND L5-S1 FACET RADIOFREQUENCY ABLATION FLUOROSCOPY TIME:  Radiation Exposure Index (as provided by the fluoroscopic device): 5.7 mGy Kerma TECHNIQUE: The procedure, risks, benefits, and alternatives were explained to the patient. Questions regarding the procedure were encouraged and answered. The patient understands and consents to the procedure. An appropriate skin entry site was determined fluoroscopically and marked. Site prepped with betadine, draped in usual sterile fashion, and infiltrated locally with 1% lidocaine. BILATERAL L3 MEDIAL BRANCH BLOCKS: A posterior oblique approach was taken to the junction of the superior articular process and transverse process on each side at L4 using 3.5 inch 22 gauge spinal needles, to lie along the course of the bilateral L3 medial branch nerves. 0.5 mL of 0.5% bupivacaine were injected. BILATERAL L4 MEDIAL BRANCH BLOCKS: A posterior oblique approach was taken to the junction of the superior articular process and transverse process on each side at L5 using 3.5 inch 22 gauge spinal needles, to lie along the course of the bilateral L4 medial branch nerves. 0.5 mL of 0.5% bupivacaine were injected. BILATERAL L5 DORSAL  RAMUS BLOCKS: A posterior oblique approach was taken to the junction of the S1 superior articular process and sacral ala on each side using 3.5 inch 22 gauge spinal needles, to lie along the course of the bilateral L5 dorsal rami. 0.5 mL of 0.5% bupivacaine were injected. The procedure was well-tolerated. IMPRESSION: 1. Technically successful medial branch/dorsal ramus blocks in preparation for bilateral L4-L5 and L5-S1 facet radiofrequency ablation. I will call the patient later today to assess for durable and sustained pain relief. An addendum will be placed at that time. Electronically Signed: By: Obie Dredge M.D. On: 09/08/2022 10:56   DG FACET JT INJ L /S  2ND LEVEL LEFT W/FL/CT  Addendum Date: 09/08/2022   ADDENDUM REPORT: 09/08/2022 15:55 ADDENDUM: I called the patient at 3:45 p.m., approximately 6 hours after the procedure. She has experienced approximately 90% relief in her facet mediated left lower back and buttock pain since the procedure. She has not experienced any improvement in her right-sided symptoms. She is therefore a good candidate for left-sided L4-L5 and L5-S1 facet radiofrequency ablation and will be scheduled at her earliest convenience. Electronically Signed   By: Obie Dredge M.D.   On: 09/08/2022 15:55   Result Date: 09/08/2022 CLINICAL DATA:  Lumbosacral spondylosis. Left-sided low back and posterior knee pain. Bilateral plantar foot numbness and tingling. Bilateral L4-L5 and L5-S1 facet medial branch blocks requested. EXAM: FLUOROSCOPICALLY GUIDED BILATERAL L3 AND L4 MEDIAL BRANCH BLOCKS AND BILATERAL L5 DORSAL RAMUS BLOCKS, IN PREPARATION FOR BILATERAL L4-L5 AND L5-S1 FACET RADIOFREQUENCY ABLATION FLUOROSCOPY TIME:  Radiation Exposure Index (as provided by the fluoroscopic device): 5.7 mGy Kerma TECHNIQUE: The procedure,  risks, benefits, and alternatives were explained to the patient. Questions regarding the procedure were encouraged and answered. The patient understands  and consents to the procedure. An appropriate skin entry site was determined fluoroscopically and marked. Site prepped with betadine, draped in usual sterile fashion, and infiltrated locally with 1% lidocaine. BILATERAL L3 MEDIAL BRANCH BLOCKS: A posterior oblique approach was taken to the junction of the superior articular process and transverse process on each side at L4 using 3.5 inch 22 gauge spinal needles, to lie along the course of the bilateral L3 medial branch nerves. 0.5 mL of 0.5% bupivacaine were injected. BILATERAL L4 MEDIAL BRANCH BLOCKS: A posterior oblique approach was taken to the junction of the superior articular process and transverse process on each side at L5 using 3.5 inch 22 gauge spinal needles, to lie along the course of the bilateral L4 medial branch nerves. 0.5 mL of 0.5% bupivacaine were injected. BILATERAL L5 DORSAL RAMUS BLOCKS: A posterior oblique approach was taken to the junction of the S1 superior articular process and sacral ala on each side using 3.5 inch 22 gauge spinal needles, to lie along the course of the bilateral L5 dorsal rami. 0.5 mL of 0.5% bupivacaine were injected. The procedure was well-tolerated. IMPRESSION: 1. Technically successful medial branch/dorsal ramus blocks in preparation for bilateral L4-L5 and L5-S1 facet radiofrequency ablation. I will call the patient later today to assess for durable and sustained pain relief. An addendum will be placed at that time. Electronically Signed: By: Obie Dredge M.D. On: 09/08/2022 10:56    Assessment & Plan:   Problem List Items Addressed This Visit       Musculoskeletal and Integument   Osteoporosis   Relevant Orders   VITAMIN D 25 Hydroxy (Vit-D Deficiency, Fractures)     Other   Anxiety state   Insomnia disorder    Worse.  Zolpidem as needed  Potential benefits of a long term benzodiazepines  use as well as potential risks  and complications were explained to the patient and were aknowledged.        Low back pain - Primary    Chronic.  Ablation today Prescribed methocarbamol as needed, tramadol as needed for severe pain.  Potential benefits of a long term opioids use as well as potential risks (i.e. addiction risk, apnea etc) and complications (i.e. Somnolence, constipation and others) were explained to the patient and were aknowledged. Continue with exercise for range of motion Blue-Emu cream was recommended to use 2-3 times a day       Relevant Medications   oxyCODONE (OXY IR/ROXICODONE) 5 MG immediate release tablet   methocarbamol (ROBAXIN) 500 MG tablet   Other Relevant Orders   Urinalysis   CBC with Differential/Platelet   Lipid panel   Vitamin B12   VITAMIN D 25 Hydroxy (Vit-D Deficiency, Fractures)   Paresthesia   Relevant Orders   Vitamin B12   Well adult exam    We discussed age appropriate health related issues, including available/recomended screening tests and vaccinations. We discussed a need for adhering to healthy diet and exercise. Labs were ordered to be later reviewed . All questions were answered. On Reclast q 12 mo -- 2015  We discussed age appropriate health related issues, including available/recomended screening tests and vaccinations. We discussed a need for adhering to healthy diet and exercise. Labs were ordered to be later reviewed . All questions were answered. Colon due 2024          Meds ordered this encounter  Medications   zolpidem (AMBIEN) 5 MG tablet    Sig: Take 1 tablet (5 mg total) by mouth at bedtime as needed for sleep.    Dispense:  30 tablet    Refill:  1   traMADol (ULTRAM) 50 MG tablet    Sig: Take 1 tablet (50 mg total) by mouth every 8 (eight) hours as needed for up to 5 days.    Dispense:  20 tablet    Refill:  1   methocarbamol (ROBAXIN) 500 MG tablet    Sig: Take 1 tablet (500 mg total) by mouth every 8 (eight) hours as needed for muscle spasms.    Dispense:  90 tablet    Refill:  2      Follow-up: Return in  about 3 months (around 12/29/2022) for a follow-up visit.  Sonda Primes, MD

## 2022-09-29 NOTE — Patient Instructions (Signed)
Blue-Emu cream -- use 2-3 times a day ? ?

## 2022-10-01 DIAGNOSIS — J3081 Allergic rhinitis due to animal (cat) (dog) hair and dander: Secondary | ICD-10-CM | POA: Diagnosis not present

## 2022-10-01 DIAGNOSIS — J301 Allergic rhinitis due to pollen: Secondary | ICD-10-CM | POA: Diagnosis not present

## 2022-10-01 DIAGNOSIS — J3089 Other allergic rhinitis: Secondary | ICD-10-CM | POA: Diagnosis not present

## 2022-10-02 DIAGNOSIS — Z1231 Encounter for screening mammogram for malignant neoplasm of breast: Secondary | ICD-10-CM | POA: Diagnosis not present

## 2022-10-02 LAB — HM MAMMOGRAPHY

## 2022-10-06 ENCOUNTER — Encounter: Payer: Self-pay | Admitting: Internal Medicine

## 2022-10-06 ENCOUNTER — Encounter: Payer: Self-pay | Admitting: Family Medicine

## 2022-10-06 DIAGNOSIS — G4700 Insomnia, unspecified: Secondary | ICD-10-CM | POA: Insufficient documentation

## 2022-10-06 NOTE — Assessment & Plan Note (Signed)
Worse.  Zolpidem as needed  Potential benefits of a long term benzodiazepines  use as well as potential risks  and complications were explained to the patient and were aknowledged.

## 2022-10-06 NOTE — Assessment & Plan Note (Signed)
We discussed age appropriate health related issues, including available/recomended screening tests and vaccinations. We discussed a need for adhering to healthy diet and exercise. Labs were ordered to be later reviewed . All questions were answered. On Reclast q 12 mo -- 2015  We discussed age appropriate health related issues, including available/recomended screening tests and vaccinations. We discussed a need for adhering to healthy diet and exercise. Labs were ordered to be later reviewed . All questions were answered. Colon due 2024

## 2022-10-08 DIAGNOSIS — J3089 Other allergic rhinitis: Secondary | ICD-10-CM | POA: Diagnosis not present

## 2022-10-08 DIAGNOSIS — J301 Allergic rhinitis due to pollen: Secondary | ICD-10-CM | POA: Diagnosis not present

## 2022-10-08 NOTE — Progress Notes (Unsigned)
[name] [address] [place] [address] Phone: [phone] Subjective:   [name], am serving as a scribe for Dr. Antoine Primas.  I'm seeing this patient by the request  of:  Plotnikov, Georgina Quint, MD  CC: Low back pain  BZJ:[id number]  08/27/2022 Patient is having the low back pain again that is seems to be out of proportion.  Concern for potentially reflex of the bone dystrophy at this time.  Patient's nerve conduction study does show that there is a bilateral chronic L5 neuropathy that is mild in degree and patient does have a very small disc protrusion at this level.  Patient has had chronic pain with this and does have weakness noted of the foot.  We discussed with patient weakness in the left leg pain different options.  Facet injections versus the possibility of medial branch block.  Because patient does have it intermittently and both legs as well.  She is elected to try medial branch blocks and for L5 and 5 S1 bilaterally.  Depending on patient's response we can see if she is a seen ablation.  We discussed that this will help her with the sensation but not help her with the weakness.  Patient understands all this.  6 weeks or 4 weeks after the injections if patient is a candidate.  Total time reviewing patient's MRI today as well as discussing different treatment     Update 10/09/2022 [name] is a 67 y.o. female coming in with complaint of lumbar radiculopathy. RFA 09/29/2022. Patient states that she is doing well since last visit. Little pain since RFA. Did try gardening yesterday and has some muscle soreness in her back. Does still have numbness in L calf. Knee pain has improved as well after RFA. Balance has also improved after doing some eccentric work.         Past Medical History:  Diagnosis Date   ALLERGIC RHINITIS 03/02/2007   Anemia    ANXIETY 03/02/2007   Arthritis    ASTHMA 11/12/2007   Dr [name]   Cancer    skin, hx  of   Dysrhythmia    pt. states at night will notice a different heart rhythem   Endometrial cancer    FIBROCYSTIC BREAST DISEASE, HX OF 03/02/2007   GERD 03/02/2007   HYPOTHYROIDISM 11/12/2007   Dr Talmage Nap   Neuromuscular disorder    carpel tunnel syndrome, compression of ulner nerve at elbows   OSTEOPENIA 11/12/2007   PEPTIC ULCER DISEASE 03/02/2007   Pneumonia    hx. of   Radiation 09/27/15-10/23/15   HDR to vaginal cuff 30 Gy   Stress fracture    TMJ SYNDROME 11/12/2007   Past Surgical History:  Procedure Laterality Date   2008 TMJ surgery      CARPAL TUNNEL WITH CUBITAL TUNNEL Left 07/2019   carpel tunnel Right 11/20/2016   colonoscopy x 2     cubital tunnel release Right 11/20/2016   deviated septum surgery - 2007     DILATION AND CURETTAGE OF UTERUS     2017   endoscopy - 1990     Mandib advancement forward  2009   right knee meniscus Right 07/16/2022   ROBOTIC ASSISTED TOTAL HYSTERECTOMY WITH BILATERAL SALPINGO OOPHERECTOMY Bilateral 08/14/2015   Procedure: XI ROBOTIC ASSISTED TOTAL HYSTERECTOMY WITH BILATERAL SALPINGO OOPHORECTOMY AND SENTAL LYMPH NODE BIOPSY;  Surgeon: Adolphus Birchwood, MD;  Location: [facility];  Service: Gynecology;  Laterality: Bilateral;   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     Social History   Socioeconomic History   Marital status: Married    Spouse name: Not on file   Number of children: Not on file   Years of education: Not on file   Highest education level: Not on file  Occupational History   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[employer]  Tobacco Use   Smoking status: Never   Smokeless tobacco: Never   Tobacco comments:    Married, 1 child  Vaping Use   Vaping Use: Never used  Substance and Sexual Activity   Alcohol use: Yes    Alcohol/week: 2.0 standard drinks of alcohol    Types: 1 Glasses of wine, 1 Cans of beer per week    Comment: daily   Drug use: No   Sexual activity: Yes  Other Topics Concern   Not on file  Social  History Narrative   Not on file   Social Determinants of Health   Financial Resource Strain: Not on file  Food Insecurity: Not on file  Transportation Needs: Not on file  Physical Activity: Not on file  Stress: Not on file  Social Connections: Not on file   Allergies  Allergen Reactions   Ibuprofen Other (See Comments)    Gastritis    Shellfish Allergy Other (See Comments)    Abdominal pain and Diarrhea   Colchicine     diarrhea   Astroglide [Gyne-Moistrin] Other (See Comments)    Itching and burning   Demerol Nausea And Vomiting   Nsaids Nausea Only    Other Reaction(s): GI Intolerance   Penicillins Hives and Other (See Comments)    Face turned red and had a headache. Has patient had a PCN reaction causing immediate rash, facial/tongue/throat swelling, SOB or lightheadedness with hypotension: no Has patient had a PCN reaction causing severe rash involving mucus membranes or skin necrosis: no Has patient had a PCN reaction that required hospitalization: no Has patient had a PCN reaction occurring within the last 10 years: no If all of the above answers are "NO", then may proceed with Cephalosporin use.   Family History  Problem Relation Age of Onset   Hypertension Father    Heart disease Father [id number]       chf   Heart disease Mother [id number]       CAD   Hypertension Other    Asthma Neg Hx     Current Outpatient Medications (Endocrine & Metabolic):    levothyroxine (SYNTHROID, LEVOTHROID) 25 MCG tablet, Take 25-50 mcg by mouth daily. She takes one tablet daily 4 days per week (Monday-Thursday) and two tablets daily 3 days per week (Friday-Sunday).   liothyronine (CYTOMEL) 5 MCG tablet, Take 5-10 mcg by mouth 2 (two) times daily. She takes one tablet in the morning and two tablets midday.   zoledronic acid (RECLAST) 5 MG/100ML SOLN injection, Inject 5 mg into the vein. Once a year  Current Outpatient Medications (Cardiovascular):    EPIPEN 2-PAK 0.3 MG/0.3ML DEVI, Inject 0.3  mg into the muscle once. Reported on 09/03/2015  Current Outpatient Medications (Respiratory):    ARNUITY ELLIPTA 100 MCG/ACT AEPB, Inhale 1 puff into the lungs daily.   cetirizine (ZYRTEC) 5 MG tablet, Take 5 mg by mouth 2 (two) times daily as needed for allergies.   fluticasone (FLONASE) 50 MCG/ACT nasal spray, Place 1 spray into the nose daily as needed for allergies. Reported on 08/06/2015   montelukast (SINGULAIR) 10 MG tablet, Take 10 mg  by mouth daily.  Current Outpatient Medications (Analgesics):    oxyCODONE (OXY IR/ROXICODONE) 5 MG immediate release tablet, 5 mg every 4 (four) hours as needed.   Current Outpatient Medications (Other):    Calcium Citrate-Vitamin D (CALCIUM CITRATE +D PO), Take 2 tablets by mouth daily.    Diclofenac Sodium 2 % SOLN, Place 2 g onto the skin 2 (two) times daily.   estradiol (ESTRACE) 0.1 MG/GM vaginal cream, PLACE 1 APPLICATORFUL VAGINALLY 3 (THREE) TIMES A WEEK   MELATONIN-CHAMOMILE PO, Take 1 capsule by mouth at bedtime as needed and may repeat dose one time if needed (For sleep.).    methocarbamol (ROBAXIN) 500 MG tablet, Take 1 tablet (500 mg total) by mouth every 8 (eight) hours as needed for muscle spasms.   METRONIDAZOLE, TOPICAL, 0.75 % LOTN, Apply 1 application topically at bedtime.   minoxidil (ROGAINE) 2 % external solution, Apply 1 application topically 4 (four) times a week.    Multiple Vitamin (MULTIVITAMIN WITH MINERALS) TABS tablet, Take 1 tablet by mouth daily.   pyridoxine (B-6) 200 MG tablet, Take 200 mg by mouth daily.   venlafaxine XR (EFFEXOR-XR) 37.5 MG 24 hr capsule, TAKE 1 CAPSULE BY MOUTH EVERY DAY WITH BREAKFAST (Patient taking differently: Take 37.5 mg by mouth. 3 times a week)   zolpidem (AMBIEN) 5 MG tablet, Take 1 tablet (5 mg total) by mouth at bedtime as needed for sleep.    Objective  Blood pressure (!) 142/82, pulse 76, height 5\' 4"  (1.626 m), weight 140 lb (63.5 kg), SpO2 97 %.   General: No apparent distress alert  and oriented x3 mood and affect normal, dressed appropriately.  HEENT: Pupils equal, extraocular movements intact  Respiratory: Patient's speak in full sentences and does not appear short of breath  Cardiovascular: No lower extremity edema, non tender, no erythema  Patient on exam can get up without any significant difficulty.  Sitting comfortably.  Patient showed exercises were had significant more stability with range of motion.    Impression and Recommendations:     The above documentation has been reviewed and is accurate and complete Judi Saa, DO

## 2022-10-09 ENCOUNTER — Ambulatory Visit (INDEPENDENT_AMBULATORY_CARE_PROVIDER_SITE_OTHER): Payer: PPO | Admitting: Family Medicine

## 2022-10-09 VITALS — BP 142/82 | HR 76 | Ht 64.0 in | Wt 140.0 lb

## 2022-10-09 DIAGNOSIS — M5442 Lumbago with sciatica, left side: Secondary | ICD-10-CM | POA: Diagnosis not present

## 2022-10-09 DIAGNOSIS — G8929 Other chronic pain: Secondary | ICD-10-CM

## 2022-10-09 NOTE — Assessment & Plan Note (Signed)
Patient has responded extremely well to the facet and radial frequency ablation.  At this point patient has not been this pain-free for multiple years.  Will follow-up with me did discuss with her that there is a chance that he may need repeat injections in 3 years with a radiofrequency ablation

## 2022-10-16 DIAGNOSIS — J301 Allergic rhinitis due to pollen: Secondary | ICD-10-CM | POA: Diagnosis not present

## 2022-10-16 DIAGNOSIS — J3089 Other allergic rhinitis: Secondary | ICD-10-CM | POA: Diagnosis not present

## 2022-10-22 DIAGNOSIS — J3089 Other allergic rhinitis: Secondary | ICD-10-CM | POA: Diagnosis not present

## 2022-10-22 DIAGNOSIS — J301 Allergic rhinitis due to pollen: Secondary | ICD-10-CM | POA: Diagnosis not present

## 2022-10-29 DIAGNOSIS — J3081 Allergic rhinitis due to animal (cat) (dog) hair and dander: Secondary | ICD-10-CM | POA: Diagnosis not present

## 2022-10-29 DIAGNOSIS — J3089 Other allergic rhinitis: Secondary | ICD-10-CM | POA: Diagnosis not present

## 2022-10-29 DIAGNOSIS — J301 Allergic rhinitis due to pollen: Secondary | ICD-10-CM | POA: Diagnosis not present

## 2022-11-03 ENCOUNTER — Encounter: Payer: Self-pay | Admitting: Family Medicine

## 2022-11-04 ENCOUNTER — Telehealth: Payer: Self-pay | Admitting: Internal Medicine

## 2022-11-04 DIAGNOSIS — K5900 Constipation, unspecified: Secondary | ICD-10-CM | POA: Diagnosis not present

## 2022-11-04 DIAGNOSIS — K5901 Slow transit constipation: Secondary | ICD-10-CM | POA: Diagnosis not present

## 2022-11-04 NOTE — Telephone Encounter (Signed)
Called patient to schedule Medicare Annual Wellness Visit (AWV). Left message for patient to call back and schedule Medicare Annual Wellness Visit (AWV).  AWV-I: 10/29/2022  Please schedule an appointment at any time with [facility].  If any questions, please contact me at [phone].  Thank you ,  Bernice Cicero Care Guide [facility] AWV TEAM Direct Dial: [phone]    

## 2022-11-05 DIAGNOSIS — J3089 Other allergic rhinitis: Secondary | ICD-10-CM | POA: Diagnosis not present

## 2022-11-05 DIAGNOSIS — J301 Allergic rhinitis due to pollen: Secondary | ICD-10-CM | POA: Diagnosis not present

## 2022-11-05 DIAGNOSIS — J3081 Allergic rhinitis due to animal (cat) (dog) hair and dander: Secondary | ICD-10-CM | POA: Diagnosis not present

## 2022-11-11 ENCOUNTER — Telehealth: Payer: Self-pay | Admitting: Internal Medicine

## 2022-11-11 NOTE — Telephone Encounter (Signed)
Contacted [name] to schedule their annual wellness visit. Appointment made for 11/27/2022.  [facility] Care Guide [facility] AWV TEAM Direct Dial: [phone]

## 2022-11-12 DIAGNOSIS — J3089 Other allergic rhinitis: Secondary | ICD-10-CM | POA: Diagnosis not present

## 2022-11-12 DIAGNOSIS — J301 Allergic rhinitis due to pollen: Secondary | ICD-10-CM | POA: Diagnosis not present

## 2022-11-12 DIAGNOSIS — J3081 Allergic rhinitis due to animal (cat) (dog) hair and dander: Secondary | ICD-10-CM | POA: Diagnosis not present

## 2022-11-19 DIAGNOSIS — E039 Hypothyroidism, unspecified: Secondary | ICD-10-CM | POA: Diagnosis not present

## 2022-11-20 DIAGNOSIS — J3081 Allergic rhinitis due to animal (cat) (dog) hair and dander: Secondary | ICD-10-CM | POA: Diagnosis not present

## 2022-11-20 DIAGNOSIS — J301 Allergic rhinitis due to pollen: Secondary | ICD-10-CM | POA: Diagnosis not present

## 2022-11-20 DIAGNOSIS — J3089 Other allergic rhinitis: Secondary | ICD-10-CM | POA: Diagnosis not present

## 2022-11-27 ENCOUNTER — Ambulatory Visit (INDEPENDENT_AMBULATORY_CARE_PROVIDER_SITE_OTHER): Payer: PPO

## 2022-11-27 VITALS — Wt 140.0 lb

## 2022-11-27 DIAGNOSIS — Z0000 Encounter for general adult medical examination without abnormal findings: Secondary | ICD-10-CM | POA: Diagnosis not present

## 2022-11-27 NOTE — Progress Notes (Signed)
Subjective:   [name] is a 67 y.o. female who presents for Medicare Annual (Subsequent) preventive examination.  Review of Systems    I connected with  Susa Loffler on 11/27/22 by a audio enabled telemedicine application and verified that I am speaking with the correct person using two identifiers.  Patient Location: Home  Provider Location: Home Office  I discussed the limitations of evaluation and management by telemedicine. The patient expressed understanding and agreed to proceed.  Cardiac Risk Factors include: advanced age (>14men, >2 women)     Objective:    Today's Vitals   11/27/22 1243  Weight: 140 lb (63.5 kg)  PainSc: 2    Body mass index is 24.03 kg/m.     11/27/2022   12:51 PM 05/30/2020    5:37 PM 05/20/2018    9:20 AM 01/19/2018    3:31 PM 11/16/2017   10:27 AM 06/17/2017    1:10 PM 03/23/2017    3:41 PM  Advanced Directives  Does Patient Have a Medical Advance Directive? Yes Yes Yes Yes Yes Yes Yes  Type of Estate agent of [place];Living will Healthcare Power of [place];Living will Healthcare Power of [place];Living will Healthcare Power of [place];Living will  Healthcare Power of [place];Living will Healthcare Power of [place];Living will  Does patient want to make changes to medical advance directive? No - Patient declined        Copy of Healthcare Power of Attorney in Chart? Yes - validated most recent copy scanned in chart (See row information) No - copy requested No - copy requested No - copy requested  No - copy requested No - copy requested    Current Medications (verified) Outpatient Encounter Medications as of 11/27/2022  Medication Sig   ARNUITY ELLIPTA 100 MCG/ACT AEPB Inhale 1 puff into the lungs daily.   Calcium Citrate-Vitamin D (CALCIUM CITRATE +D PO) Take 2 tablets by mouth daily.    cetirizine (ZYRTEC) 5 MG tablet Take 5 mg by mouth 2 (two) times daily as needed for allergies.   Diclofenac Sodium 2 %  SOLN Place 2 g onto the skin 2 (two) times daily.   EPIPEN 2-PAK 0.3 MG/0.3ML DEVI Inject 0.3 mg into the muscle once. Reported on 09/03/2015   estradiol (ESTRACE) 0.1 MG/GM vaginal cream PLACE 1 APPLICATORFUL VAGINALLY 3 (THREE) TIMES A WEEK   fluticasone (FLONASE) 50 MCG/ACT nasal spray Place 1 spray into the nose daily as needed for allergies. Reported on 08/06/2015   levothyroxine (SYNTHROID, LEVOTHROID) 25 MCG tablet Take 25-50 mcg by mouth daily. She takes one tablet daily 4 days per week (Monday-Thursday) and two tablets daily 3 days per week (Friday-Sunday).   liothyronine (CYTOMEL) 5 MCG tablet Take 5-10 mcg by mouth 2 (two) times daily. She takes one tablet in the morning and two tablets midday.   MELATONIN-CHAMOMILE PO Take 1 capsule by mouth at bedtime as needed and may repeat dose one time if needed (For sleep.).    methocarbamol (ROBAXIN) 500 MG tablet Take 1 tablet (500 mg total) by mouth every 8 (eight) hours as needed for muscle spasms.   METRONIDAZOLE, TOPICAL, 0.75 % LOTN Apply 1 application topically at bedtime.   minoxidil (ROGAINE) 2 % external solution Apply 1 application topically 4 (four) times a week.    montelukast (SINGULAIR) 10 MG tablet Take 10 mg by mouth daily.   Multiple Vitamin (MULTIVITAMIN WITH MINERALS) TABS tablet Take 1 tablet by mouth daily.   oxyCODONE (OXY IR/ROXICODONE) 5 MG immediate  release tablet 5 mg every 4 (four) hours as needed.   pyridoxine (B-6) 200 MG tablet Take 200 mg by mouth daily.   venlafaxine XR (EFFEXOR-XR) 37.5 MG 24 hr capsule TAKE 1 CAPSULE BY MOUTH EVERY DAY WITH BREAKFAST (Patient taking differently: Take 37.5 mg by mouth. 3 times a week)   zoledronic acid (RECLAST) 5 MG/100ML SOLN injection Inject 5 mg into the vein. Once a year   zolpidem (AMBIEN) 5 MG tablet Take 1 tablet (5 mg total) by mouth at bedtime as needed for sleep.   [DISCONTINUED] escitalopram (LEXAPRO) 10 MG tablet Take 10 mg by mouth daily.     No facility-administered  encounter medications on file as of 11/27/2022.    Allergies (verified) Ibuprofen, Shellfish allergy, Colchicine, Astroglide [gyne-moistrin], Demerol, Nsaids, and Penicillins   History: Past Medical History:  Diagnosis Date   ALLERGIC RHINITIS 03/02/2007   Anemia    ANXIETY 03/02/2007   Arthritis    ASTHMA 11/12/2007   Dr [name]   [facility])    skin, hx of   Dysrhythmia    pt. states at night will notice a different heart rhythem   Endometrial cancer (HCC)    FIBROCYSTIC BREAST DISEASE, HX OF 03/02/2007   GERD 03/02/2007   HYPOTHYROIDISM 11/12/2007   Dr Talmage Nap   Neuromuscular disorder (HCC)    carpel tunnel syndrome, compression of ulner nerve at elbows   OSTEOPENIA 11/12/2007   PEPTIC ULCER DISEASE 03/02/2007   Pneumonia    hx. of   Radiation 09/27/15-10/23/15   HDR to vaginal cuff 30 Gy   Stress fracture    TMJ SYNDROME 11/12/2007   Past Surgical History:  Procedure Laterality Date   2008 TMJ surgery      CARPAL TUNNEL WITH CUBITAL TUNNEL Left 07/2019   carpel tunnel Right 11/20/2016   colonoscopy x 2     cubital tunnel release Right 11/20/2016   deviated septum surgery - 2007     DILATION AND CURETTAGE OF UTERUS     2017   endoscopy - 1990     Mandib advancement forward  2009   right knee meniscus Right 07/16/2022   ROBOTIC ASSISTED TOTAL HYSTERECTOMY WITH BILATERAL SALPINGO OOPHERECTOMY Bilateral 08/14/2015   Procedure: XI ROBOTIC ASSISTED TOTAL HYSTERECTOMY WITH BILATERAL SALPINGO OOPHORECTOMY AND SENTAL LYMPH NODE BIOPSY;  Surgeon: Adolphus Birchwood, MD;  Location: [facility];  Service: Gynecology;  Laterality: Bilateral;   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     Family History  Problem Relation Age of Onset   Hypertension Father    Heart disease Father 63       chf   Heart disease Mother 68       CAD   Hypertension Other    Asthma Neg Hx    Social History   Socioeconomic History   Marital status: Married    Spouse name: Not on file   Number of  children: Not on file   Years of education: Not on file   Highest education level: Not on file  Occupational History   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[employer]  Tobacco Use   Smoking status: Never   Smokeless tobacco: Never   Tobacco comments:    Married, 1 child  Vaping Use   Vaping Use: Never used  Substance and Sexual Activity   Alcohol use: Yes    Alcohol/week: 2.0 standard drinks of alcohol    Types: 1 Glasses of wine, 1 Cans of beer per week  Comment: daily   Drug use: No   Sexual activity: Yes  Other Topics Concern   Not on file  Social History Narrative   Not on file   Social Determinants of Health   Financial Resource Strain: Not on file  Food Insecurity: No Food Insecurity (11/27/2022)   Hunger Vital Sign    Worried About Running Out of Food in the Last Year: Never true    Ran Out of Food in the Last Year: Never true  Transportation Needs: No Transportation Needs (11/27/2022)   PRAPARE - Administrator, Civil Service (Medical): No    Lack of Transportation (Non-Medical): No  Physical Activity: Sufficiently Active (11/27/2022)   Exercise Vital Sign    Days of Exercise per Week: 7 days    Minutes of Exercise per Session: 30 min  Stress: No Stress Concern Present (11/27/2022)   [employer] of Occupational Health - Occupational Stress Questionnaire    Feeling of Stress : Not at all  Social Connections: Socially Integrated (11/27/2022)   Social Connection and Isolation Panel [NHANES]    Frequency of Communication with Friends and Family: More than three times a week    Frequency of Social Gatherings with Friends and Family: More than three times a week    Attends Religious Services: More than 4 times per year    Active Member of [employer] or Organizations: Yes    Attends Engineer, structural: More than 4 times per year    Marital Status: Married    Tobacco Counseling Counseling given: Not Answered Tobacco comments: Married, 1  child   Clinical Intake:  Pre-visit preparation completed: Yes  Pain : 0-10 Pain Score: 2  Pain Type: Chronic pain Pain Location: Back Pain Orientation: Lower Pain Descriptors / Indicators: Aching Pain Onset: More than a month ago Pain Frequency: Intermittent     BMI - recorded: 24.03 Nutritional Risks: None Diabetes: No  How often do you need to have someone help you when you read instructions, pamphlets, or other written materials from your doctor or pharmacy?: 1 - Never  Diabetic NO   Interpreter Needed?: No  Information entered by :: Fredirick Maudlin   Activities of Daily Living    11/27/2022   12:52 PM  In your present state of health, do you have any difficulty performing the following activities:  Hearing? 0  Vision? 0  Difficulty concentrating or making decisions? 0  Walking or climbing stairs? 0  Dressing or bathing? 0  Doing errands, shopping? 0  Preparing Food and eating ? N  Using the Toilet? N  In the past six months, have you accidently leaked urine? N  Do you have problems with loss of bowel control? N  Managing your Medications? N  Managing your Finances? N  Housekeeping or managing your Housekeeping? N    Patient Care Team: Plotnikov, Georgina Quint, MD as PCP - General Marcelle Overlie, MD as PCP - OBGYN (Obstetrics and Gynecology) Eileen Stanford, MD as Referring Physician (Allergy and Immunology) Dorisann Frames, MD as Consulting Physician (Endocrinology) Bettey Costa, MD as Consulting Physician (Dermatology) Shirleen Schirmer, PA-C as Physician Assistant (Internal Medicine)  Indicate any recent Medical Services you may have received from other than [facility] providers in the past year (date may be approximate).     Assessment:   This is a routine wellness examination for [place].  Hearing/Vision screen Hearing Screening - Comments:: Denies hearing difficulties   Vision Screening - Comments:: Wears rx glasses - up to  date with routine eye  exams with  Dr Yetta Barre   Dietary issues and exercise activities discussed: Current Exercise Habits: Structured exercise class, Type of exercise: Other - see comments;strength training/weights (water exercises), Time (Minutes): 60, Frequency (Times/Week): 4, Weekly Exercise (Minutes/Week): 240   Goals Addressed             This Visit's Progress    Patient Stated       Work on weight loss and staying healthy      Depression Screen    11/27/2022   12:49 PM 09/29/2022    8:33 AM 09/17/2021    9:21 AM 08/30/2021   11:27 AM 07/05/2019    8:56 AM 05/20/2018    9:24 AM 03/16/2017    4:34 PM  PHQ 2/9 Scores  PHQ - 2 Score 0 2 0 2 0 0 0  PHQ- 9 Score 0 8 1 6        Fall Risk    11/27/2022   12:52 PM 09/29/2022    8:33 AM 09/17/2021    9:21 AM 08/30/2021   11:26 AM 05/20/2018    9:24 AM  Fall Risk   Falls in the past year? 1 1 1 1  0  Number falls in past yr: 1 0 0 0 0  Injury with Fall? 1 1 0 0 0  Risk for fall due to : History of fall(s);Impaired balance/gait;Orthopedic patient History of fall(s);No Fall Risks     Follow up Education provided;Falls prevention discussed;Falls evaluation completed Falls evaluation completed       FALL RISK PREVENTION PERTAINING TO THE HOME:  Any stairs in or around the home? Yes  If so, are there any without handrails? No  Home free of loose throw rugs in walkways, pet beds, electrical cords, etc? No  Adequate lighting in your home to reduce risk of falls? Yes   ASSISTIVE DEVICES UTILIZED TO PREVENT FALLS:  Life alert? No  Use of a cane, walker or w/c? No  Grab bars in the bathroom? No  Shower chair or bench in shower? No  Elevated toilet seat or a handicapped toilet? Yes   TIMED UP AND GO:  Was the test performed?  NO televisit  .   Cognitive Function:        11/27/2022   12:49 PM  6CIT Screen  What time? 0 points  Count back from 20 0 points  Months in reverse 0 points  Repeat phrase 0 points    Immunizations Immunization History   Administered Date(s) Administered   Influenza Inj Mdck Quad Pf 03/17/2018   Influenza Inj Mdck Quad With Preservative 03/22/2019   Influenza, High Dose Seasonal PF 03/24/2012, 09/10/2016, 09/09/2017, 07/23/2018   Influenza,inj,Quad PF,6+ Mos 03/27/2020   Influenza-Unspecified 03/02/2017, 03/04/2021   PFIZER(Purple Top)SARS-COV-2 Vaccination 06/28/2019, 07/18/2019, 04/20/2020, 01/02/2021, 03/18/2021   PNEUMOCOCCAL CONJUGATE-20 09/17/2021   Pfizer Covid-19 Vaccine Bivalent Booster 51yrs & up 04/08/2021   Td 05/17/1999   Tdap 12/01/2014   Unspecified SARS-COV-2 Vaccination 06/28/2019, 07/18/2019    TDAP status: Up to date  Flu Vaccine status: Up to date  Pneumococcal vaccine status: Up to date  Covid-19 vaccine status: Information provided on how to obtain vaccines.   Qualifies for Shingles Vaccine? Yes   Zostavax completed No   Shingrix Completed?: No.    Education has been provided regarding the importance of this vaccine. Patient has been advised to call insurance company to determine out of pocket expense if they have not yet received this vaccine. Advised may  also receive vaccine at local pharmacy or Health Dept. Verbalized acceptance and understanding.  Screening Tests Health Maintenance  Topic Date Due   Hepatitis C Screening  Never done   Zoster Vaccines- Shingrix (1 of 2) Never done   COVID-19 Vaccine (8 - 2023-24 season) 12/13/2023 (Originally 02/28/2022)   INFLUENZA VACCINE  01/29/2023   Medicare Annual Wellness (AWV)  11/27/2023   MAMMOGRAM  10/01/2024   DTaP/Tdap/Td (3 - Td or Tdap) 11/30/2024   Colonoscopy  06/16/2032   Pneumonia Vaccine 63+ Years old  Completed   DEXA SCAN  Completed   HPV VACCINES  Aged Out    Health Maintenance  Health Maintenance Due  Topic Date Due   Hepatitis C Screening  Never done   Zoster Vaccines- Shingrix (1 of 2) Never done    Colorectal cancer screening: Type of screening: Colonoscopy. Completed 06/16/2022. Repeat every 5  years  Mammogram status: Completed 10/02/22. Repeat every year  Bone Density status: Completed 06/16/22. Results reflect: Bone density results: OSTEOPOROSIS. Repeat every 5 years.  Lung Cancer Screening: (Low Dose CT Chest recommended if Age 4-80 years, 30 pack-year currently smoking OR have quit w/in 15years.) does not qualify.   Lung Cancer Screening Referral: no  Additional Screening:  Hepatitis C Screening: does qualify; Completed discuss at lab appointment   Vision Screening: Recommended annual ophthalmology exams for early detection of glaucoma and other disorders of the eye. Is the patient up to date with their annual eye exam?  Yes  Who is the provider or what is the name of the office in which the patient attends annual eye exams? Dr Yetta Barre  If pt is not established with a provider, would they like to be referred to a provider to establish care? No .   Dental Screening: Recommended annual dental exams for proper oral hygiene  Community Resource Referral / Chronic Care Management: CRR required this visit?  No   CCM required this visit?  No      Plan:     I have personally reviewed and noted the following in the patient's chart:   Medical and social history Use of alcohol, tobacco or illicit drugs  Current medications and supplements including opioid prescriptions. Patient is currently taking opioid prescriptions. Information provided to patient regarding non-opioid alternatives. Patient advised to discuss non-opioid treatment plan with their provider. Functional ability and status Nutritional status Physical activity Advanced directives List of other physicians Hospitalizations, surgeries, and ER visits in previous 12 months Vitals Screenings to include cognitive, depression, and falls Referrals and appointments  In addition, I have reviewed and discussed with patient certain preventive protocols, quality metrics, and best practice recommendations. A written  personalized care plan for preventive services as well as general preventive health recommendations were provided to patient.     Annabell Sabal, CMA   11/27/2022   Nurse Notes: None

## 2022-11-27 NOTE — Patient Instructions (Signed)
[name] , Thank you for taking time to come for your Medicare Wellness Visit. I appreciate your ongoing commitment to your health goals. Please review the following plan we discussed and let me know if I can assist you in the future.   These are the goals we discussed:  Goals      Patient Stated     Work on weight loss and staying healthy        This is a list of the screening recommended for you and due dates:  Health Maintenance  Topic Date Due   Hepatitis C Screening  Never done   Zoster (Shingles) Vaccine (1 of 2) Never done   COVID-19 Vaccine (8 - 2023-24 season) 12/13/2023*   Flu Shot  01/29/2023   Medicare Annual Wellness Visit  11/27/2023   Mammogram  10/01/2024   DTaP/Tdap/Td vaccine (3 - Td or Tdap) 11/30/2024   Colon Cancer Screening  06/16/2032   Pneumonia Vaccine  Completed   DEXA scan (bone density measurement)  Completed   HPV Vaccine  Aged Out  *Topic was postponed. The date shown is not the original due date.    Advanced directives: Advance directive discussed with you today. I have provided a copy for you to complete at home and have notarized. Once this is complete please bring a copy in to our office so we can scan it into your chart.   Conditions/risks identified: Opioid Pain Medicine Management Opioids are powerful medicines that are used to treat moderate to severe pain. When used for short periods of time, they can help you to: Sleep better. Do better in physical or occupational therapy. Feel better in the first few days after an injury. Recover from surgery. Opioids should be taken with the supervision of a trained health care provider. They should be taken for the shortest period of time possible. This is because opioids can be addictive, and the longer you take opioids, the greater your risk of addiction. This addiction can also be called opioid use disorder. What are the risks? Using opioid pain medicines for longer than 3 days increases your risk  of side effects. Side effects include: Constipation. Nausea and vomiting. Breathing difficulties (respiratory depression). Drowsiness. Confusion. Opioid use disorder. Itching. Taking opioid pain medicine for a long period of time can affect your ability to do daily tasks. It also puts you at risk for: Motor vehicle crashes. Depression. Suicide. Heart attack. Overdose, which can be life-threatening. What is a pain treatment plan? A pain treatment plan is an agreement between you and your health care provider. Pain is unique to each person, and treatments vary depending on your condition. To manage your pain, you and your health care provider need to work together. To help you do this: Discuss the goals of your treatment, including how much pain you might expect to have and how you will manage the pain. Review the risks and benefits of taking opioid medicines. Remember that a good treatment plan uses more than one approach and minimizes the chance of side effects. Be honest about the amount of medicines you take and about any drug or alcohol use. Get pain medicine prescriptions from only one health care provider. Pain can be managed with many types of alternative treatments. Ask your health care provider to refer you to one or more specialists who can help you manage pain through: Physical or occupational therapy. Counseling (cognitive behavioral therapy). Good nutrition. Biofeedback. Massage. Meditation. Non-opioid medicine. Following a gentle exercise program. How  to use opioid pain medicine Taking medicine Take your pain medicine exactly as told by your health care provider. Take it only when you need it. If your pain gets less severe, you may take less than your prescribed dose if your health care provider approves. If you are not having pain, do nottake pain medicine unless your health care provider tells you to take it. If your pain is severe, do nottry to treat it yourself by  taking more pills than instructed on your prescription. Contact your health care provider for help. Write down the times when you take your pain medicine. It is easy to become confused while on pain medicine. Writing the time can help you avoid overdose. Take other over-the-counter or prescription medicines only as told by your health care provider. Keeping yourself and others safe  While you are taking opioid pain medicine: Do not drive, use machinery, or power tools. Do not sign legal documents. Do not drink alcohol. Do not take sleeping pills. Do not supervise children by yourself. Do not do activities that require climbing or being in high places. Do not go to a lake, river, ocean, spa, or swimming pool. Do not share your pain medicine with anyone. Keep pain medicine in a locked cabinet or in a secure area where pets and children cannot reach it. Stopping your use of opioids If you have been taking opioid medicine for more than a few weeks, you may need to slowly decrease (taper) how much you take until you stop completely. Tapering your use of opioids can decrease your risk of symptoms of withdrawal, such as: Pain and cramping in the abdomen. Nausea. Sweating. Sleepiness. Restlessness. Uncontrollable shaking (tremors). Cravings for the medicine. Do not attempt to taper your use of opioids on your own. Talk with your health care provider about how to do this. Your health care provider may prescribe a step-down schedule based on how much medicine you are taking and how long you have been taking it. Getting rid of leftover pills Do not save any leftover pills. Get rid of leftover pills safely by: Taking the medicine to a prescription take-back program. This is usually offered by the county or law enforcement. Bringing them to a pharmacy that has a drug disposal container. Flushing them down the toilet. Check the label or package insert of your medicine to see whether this is safe to  do. Throwing them out in the trash. Check the label or package insert of your medicine to see whether this is safe to do. If it is safe to throw it out, remove the medicine from the original container, put it into a sealable bag or container, and mix it with used coffee grounds, food scraps, dirt, or cat litter before putting it in the trash. Follow these instructions at home: Activity Do exercises as told by your health care provider. Avoid activities that make your pain worse. Return to your normal activities as told by your health care provider. Ask your health care provider what activities are safe for you. General instructions You may need to take these actions to prevent or treat constipation: Drink enough fluid to keep your urine pale yellow. Take over-the-counter or prescription medicines. Eat foods that are high in fiber, such as beans, whole grains, and fresh fruits and vegetables. Limit foods that are high in fat and processed sugars, such as fried or sweet foods. Keep all follow-up visits. This is important. Where to find support If you have been taking opioids for  a long time, you may benefit from receiving support for quitting from a local support group or counselor. Ask your health care provider for a referral to these resources in your area. Where to find more information Centers for Disease Control and Prevention (CDC): FootballExhibition.com.br [place] Food and Drug Administration (FDA): PumpkinSearch.com.ee Get help right away if: You may have taken too much of an opioid (overdosed). Common symptoms of an overdose: Your breathing is slower or more shallow than normal. You have a very slow heartbeat (pulse). You have slurred speech. You have nausea and vomiting. Your pupils become very small. You have other potential symptoms: You are very confused. You faint or feel like you will faint. You have cold, clammy skin. You have blue lips or fingernails. You have thoughts of harming yourself or  harming others. These symptoms may represent a serious problem that is an emergency. Do not wait to see if the symptoms will go away. Get medical help right away. Call your local emergency services (911 in the [place] Do not drive yourself to the hospital.  If you ever feel like you may hurt yourself or others, or have thoughts about taking your own life, get help right away. Go to your nearest emergency department or: Call your local emergency services (911 in the [place] Call the [facility] (479-358-1602 in the [place] Call a suicide crisis helpline, such as the National Suicide Prevention Lifeline at 520 758 1622 or 988 in the [place] This is open 24 hours a day in the [place] Text the Crisis Text Line at 808-774-0409 (in the [place] Summary Opioid medicines can help you manage moderate to severe pain for a short period of time. A pain treatment plan is an agreement between you and your health care provider. Discuss the goals of your treatment, including how much pain you might expect to have and how you will manage the pain. If you think that you or someone else may have taken too much of an opioid, get medical help right away. This information is not intended to replace advice given to you by your health care provider. Make sure you discuss any questions you have with your health care provider. Document Revised: 01/09/2021 Document Reviewed: 09/26/2020 Elsevier Patient Education  2024 Elsevier Inc.   Next appointment: Follow up in one year for your annual wellness visit 11/27/23   Preventive Care 65 Years and Older, Female Preventive care refers to lifestyle choices and visits with your health care provider that can promote health and wellness. What does preventive care include? A yearly physical exam. This is also called an annual well check. Dental exams once or twice a year. Routine eye exams. Ask your health care provider how often you should have your eyes  checked. Personal lifestyle choices, including: Daily care of your teeth and gums. Regular physical activity. Eating a healthy diet. Avoiding tobacco and drug use. Limiting alcohol use. Practicing safe sex. Taking low-dose aspirin every day. Taking vitamin and mineral supplements as recommended by your health care provider. What happens during an annual well check? The services and screenings done by your health care provider during your annual well check will depend on your age, overall health, lifestyle risk factors, and family history of disease. Counseling  Your health care provider may ask you questions about your: Alcohol use. Tobacco use. Drug use. Emotional well-being. Home and relationship well-being. Sexual activity. Eating habits. History of falls. Memory and ability to understand (cognition). Work and work Astronomer. Reproductive health. Screening  You may have the following tests or measurements: Height, weight, and BMI. Blood pressure. Lipid and cholesterol levels. These may be checked every 5 years, or more frequently if you are over 42 years old. Skin check. Lung cancer screening. You may have this screening every year starting at age 48 if you have a 30-pack-year history of smoking and currently smoke or have quit within the past 15 years. Fecal occult blood test (FOBT) of the stool. You may have this test every year starting at age 20. Flexible sigmoidoscopy or colonoscopy. You may have a sigmoidoscopy every 5 years or a colonoscopy every 10 years starting at age 89. Hepatitis C blood test. Hepatitis B blood test. Sexually transmitted disease (STD) testing. Diabetes screening. This is done by checking your blood sugar (glucose) after you have not eaten for a while (fasting). You may have this done every 1-3 years. Bone density scan. This is done to screen for osteoporosis. You may have this done starting at age 52. Mammogram. This may be done every 1-2  years. Talk to your health care provider about how often you should have regular mammograms. Talk with your health care provider about your test results, treatment options, and if necessary, the need for more tests. Vaccines  Your health care provider may recommend certain vaccines, such as: Influenza vaccine. This is recommended every year. Tetanus, diphtheria, and acellular pertussis (Tdap, Td) vaccine. You may need a Td booster every 10 years. Zoster vaccine. You may need this after age 61. Pneumococcal 13-valent conjugate (PCV13) vaccine. One dose is recommended after age 72. Pneumococcal polysaccharide (PPSV23) vaccine. One dose is recommended after age 58. Talk to your health care provider about which screenings and vaccines you need and how often you need them. This information is not intended to replace advice given to you by your health care provider. Make sure you discuss any questions you have with your health care provider. Document Released: 07/13/2015 Document Revised: 03/05/2016 Document Reviewed: 04/17/2015 Elsevier Interactive Patient Education  2017 [employer].  Fall Prevention in the Home Falls can cause injuries. They can happen to people of all ages. There are many things you can do to make your home safe and to help prevent falls. What can I do on the outside of my home? Regularly fix the edges of walkways and driveways and fix any cracks. Remove anything that might make you trip as you walk through a door, such as a raised step or threshold. Trim any bushes or trees on the path to your home. Use bright outdoor lighting. Clear any walking paths of anything that might make someone trip, such as rocks or tools. Regularly check to see if handrails are loose or broken. Make sure that both sides of any steps have handrails. Any raised decks and porches should have guardrails on the edges. Have any leaves, snow, or ice cleared regularly. Use sand or salt on walking paths  during winter. Clean up any spills in your garage right away. This includes oil or grease spills. What can I do in the bathroom? Use night lights. Install grab bars by the toilet and in the tub and shower. Do not use towel bars as grab bars. Use non-skid mats or decals in the tub or shower. If you need to sit down in the shower, use a plastic, non-slip stool. Keep the floor dry. Clean up any water that spills on the floor as soon as it happens. Remove soap buildup in the tub or shower regularly.  Attach bath mats securely with double-sided non-slip rug tape. Do not have throw rugs and other things on the floor that can make you trip. What can I do in the bedroom? Use night lights. Make sure that you have a light by your bed that is easy to reach. Do not use any sheets or blankets that are too big for your bed. They should not hang down onto the floor. Have a firm chair that has side arms. You can use this for support while you get dressed. Do not have throw rugs and other things on the floor that can make you trip. What can I do in the kitchen? Clean up any spills right away. Avoid walking on wet floors. Keep items that you use a lot in easy-to-reach places. If you need to reach something above you, use a strong step stool that has a grab bar. Keep electrical cords out of the way. Do not use floor polish or wax that makes floors slippery. If you must use wax, use non-skid floor wax. Do not have throw rugs and other things on the floor that can make you trip. What can I do with my stairs? Do not leave any items on the stairs. Make sure that there are handrails on both sides of the stairs and use them. Fix handrails that are broken or loose. Make sure that handrails are as long as the stairways. Check any carpeting to make sure that it is firmly attached to the stairs. Fix any carpet that is loose or worn. Avoid having throw rugs at the top or bottom of the stairs. If you do have throw  rugs, attach them to the floor with carpet tape. Make sure that you have a light switch at the top of the stairs and the bottom of the stairs. If you do not have them, ask someone to add them for you. What else can I do to help prevent falls? Wear shoes that: Do not have high heels. Have rubber bottoms. Are comfortable and fit you well. Are closed at the toe. Do not wear sandals. If you use a stepladder: Make sure that it is fully opened. Do not climb a closed stepladder. Make sure that both sides of the stepladder are locked into place. Ask someone to hold it for you, if possible. Clearly mark and make sure that you can see: Any grab bars or handrails. First and last steps. Where the edge of each step is. Use tools that help you move around (mobility aids) if they are needed. These include: Canes. Walkers. Scooters. Crutches. Turn on the lights when you go into a dark area. Replace any light bulbs as soon as they burn out. Set up your furniture so you have a clear path. Avoid moving your furniture around. If any of your floors are uneven, fix them. If there are any pets around you, be aware of where they are. Review your medicines with your doctor. Some medicines can make you feel dizzy. This can increase your chance of falling. Ask your doctor what other things that you can do to help prevent falls. This information is not intended to replace advice given to you by your health care provider. Make sure you discuss any questions you have with your health care provider. Document Released: 04/12/2009 Document Revised: 11/22/2015 Document Reviewed: 07/21/2014 Elsevier Interactive Patient Education  2017 [employer].

## 2022-12-03 DIAGNOSIS — J3089 Other allergic rhinitis: Secondary | ICD-10-CM | POA: Diagnosis not present

## 2022-12-03 DIAGNOSIS — J301 Allergic rhinitis due to pollen: Secondary | ICD-10-CM | POA: Diagnosis not present

## 2022-12-10 DIAGNOSIS — J301 Allergic rhinitis due to pollen: Secondary | ICD-10-CM | POA: Diagnosis not present

## 2022-12-10 DIAGNOSIS — J3081 Allergic rhinitis due to animal (cat) (dog) hair and dander: Secondary | ICD-10-CM | POA: Diagnosis not present

## 2022-12-10 DIAGNOSIS — J3089 Other allergic rhinitis: Secondary | ICD-10-CM | POA: Diagnosis not present

## 2022-12-15 DIAGNOSIS — Z85828 Personal history of other malignant neoplasm of skin: Secondary | ICD-10-CM | POA: Diagnosis not present

## 2022-12-15 DIAGNOSIS — L821 Other seborrheic keratosis: Secondary | ICD-10-CM | POA: Diagnosis not present

## 2022-12-15 DIAGNOSIS — D485 Neoplasm of uncertain behavior of skin: Secondary | ICD-10-CM | POA: Diagnosis not present

## 2022-12-15 DIAGNOSIS — D2271 Melanocytic nevi of right lower limb, including hip: Secondary | ICD-10-CM | POA: Diagnosis not present

## 2022-12-15 DIAGNOSIS — L739 Follicular disorder, unspecified: Secondary | ICD-10-CM | POA: Diagnosis not present

## 2022-12-15 DIAGNOSIS — D2261 Melanocytic nevi of right upper limb, including shoulder: Secondary | ICD-10-CM | POA: Diagnosis not present

## 2022-12-15 DIAGNOSIS — D225 Melanocytic nevi of trunk: Secondary | ICD-10-CM | POA: Diagnosis not present

## 2022-12-15 DIAGNOSIS — D224 Melanocytic nevi of scalp and neck: Secondary | ICD-10-CM | POA: Diagnosis not present

## 2022-12-15 DIAGNOSIS — L570 Actinic keratosis: Secondary | ICD-10-CM | POA: Diagnosis not present

## 2022-12-15 DIAGNOSIS — D2262 Melanocytic nevi of left upper limb, including shoulder: Secondary | ICD-10-CM | POA: Diagnosis not present

## 2022-12-15 DIAGNOSIS — L718 Other rosacea: Secondary | ICD-10-CM | POA: Diagnosis not present

## 2022-12-15 DIAGNOSIS — L565 Disseminated superficial actinic porokeratosis (DSAP): Secondary | ICD-10-CM | POA: Diagnosis not present

## 2022-12-15 DIAGNOSIS — D2272 Melanocytic nevi of left lower limb, including hip: Secondary | ICD-10-CM | POA: Diagnosis not present

## 2022-12-16 DIAGNOSIS — J301 Allergic rhinitis due to pollen: Secondary | ICD-10-CM | POA: Diagnosis not present

## 2022-12-16 DIAGNOSIS — J3089 Other allergic rhinitis: Secondary | ICD-10-CM | POA: Diagnosis not present

## 2022-12-17 ENCOUNTER — Ambulatory Visit (INDEPENDENT_AMBULATORY_CARE_PROVIDER_SITE_OTHER): Payer: PPO

## 2022-12-17 ENCOUNTER — Encounter: Payer: Self-pay | Admitting: Family Medicine

## 2022-12-17 ENCOUNTER — Ambulatory Visit (INDEPENDENT_AMBULATORY_CARE_PROVIDER_SITE_OTHER): Payer: PPO | Admitting: Family Medicine

## 2022-12-17 VITALS — BP 112/66 | HR 73 | Ht 64.0 in | Wt 141.0 lb

## 2022-12-17 DIAGNOSIS — G5762 Lesion of plantar nerve, left lower limb: Secondary | ICD-10-CM

## 2022-12-17 DIAGNOSIS — S92534A Nondisplaced fracture of distal phalanx of right lesser toe(s), initial encounter for closed fracture: Secondary | ICD-10-CM | POA: Diagnosis not present

## 2022-12-17 DIAGNOSIS — S99921A Unspecified injury of right foot, initial encounter: Secondary | ICD-10-CM | POA: Diagnosis not present

## 2022-12-17 DIAGNOSIS — S92321A Displaced fracture of second metatarsal bone, right foot, initial encounter for closed fracture: Secondary | ICD-10-CM | POA: Diagnosis not present

## 2022-12-17 NOTE — Patient Instructions (Addendum)
Thank you for coming in today.   Try metatarsal pads or insoles with metatarsal pads.  You can get that at [employer].   Ok to buddy tape the toes especially barefoot.  If not improving over the weekend we can do a shot anytime.

## 2022-12-17 NOTE — Progress Notes (Signed)
   I, Stevenson Clinch, CMA acting as a scribe for Maria Graham, MD.  [name] is a 67 y.o. female who presents to [facility] at [facility] today for R foot pain. Pt was previously seen by Dr. Katrinka Blazing on 10/09/22 for LBP and underwent facet and radial frequency ablation on 09/29/22.  Today, pt c/o R foot pain x 1 week. Pt locates pain to the 2nd toe. Sx started after dropping a mason jar on the foot and toe. Pain and swelling present. Has started to develop more redness over the past couple of days. Mortons neuroma sx have started to flare over the past few days as well. Unable to take NSAIDs to d/t upset. Has tried Tylenol with short-term/minimal relief. Sx worse with WB and ambulation.   Aggravates: bearing weight Treatments tried: Tylenol  Pertinent review of systems: No fevers or chills  Relevant historical information: History of contralateral left foot Morton's neuroma.   Exam:  BP 112/66   Pulse 73   Ht 5\' 4"  (1.626 m)   Wt 141 lb (64 kg)   SpO2 98%   BMI 24.20 kg/m  General: Well Developed, well nourished, and in no acute distress.   MSK: Right foot: Swelling and erythema at DIP second toe.  Tender palpation.  Skin is not tender to very light touch.  Capillary fill is intact.  Additionally the foot is mildly tender palpation at the second metatarsal head and does have a positive squeeze test.    Lab and Radiology Results  X-ray images right second toe personally and independently interpreted.  Transverse fracture through the distal phalanx Await formal radiology review    Assessment and Plan: 66 y.o. female with right second toe fracture.  This will heal with buddy taping and protection with supportive footwear.  However she also appears to have a upset Morton's neuroma that would probably benefit from a steroid injection.  I would like to avoid steroid injections in the setting of a known fracture as steroid can interferes with fracture healing.   Will try a metatarsal pad over the weekend and if has not sufficient then she will check back and we can do an injection.   PDMP not reviewed this encounter. Orders Placed This Encounter  Procedures   DG Toe 2nd Right    Standing Status:   Future    Number of Occurrences:   1    Standing Expiration Date:   01/16/2023    Order Specific Question:   Reason for Exam (SYMPTOM  OR DIAGNOSIS REQUIRED)    Answer:   right 2nd toe injury    Order Specific Question:   Preferred imaging location?    Answer:   [facility]   No orders of the defined types were placed in this encounter.    Discussed warning signs or symptoms. Please see discharge instructions. Patient expresses understanding.   The above documentation has been reviewed and is accurate and complete [name], M.D.

## 2022-12-18 ENCOUNTER — Telehealth: Payer: Self-pay

## 2022-12-18 ENCOUNTER — Telehealth: Payer: Self-pay | Admitting: Family Medicine

## 2022-12-18 MED ORDER — DOXYCYCLINE HYCLATE 100 MG PO TABS: 100.0000 mg | ORAL_TABLET | Freq: Two times a day (BID) | ORAL | 0 refills | Status: AC

## 2022-12-18 NOTE — Telephone Encounter (Signed)
error 

## 2022-12-18 NOTE — Telephone Encounter (Signed)
Pt was seen yesterday for foot/toe injury. The area was red and swollen and Dr. Denyse Amass offered abx but pt declined at the time.   She now requests abx be sent in.   Also has GI issues with antiobiotics at times and wanted him to know that as well  Forwarding to Dr. Denyse Amass.

## 2022-12-18 NOTE — Addendum Note (Signed)
Addended by: Rodolph Bong on: 12/18/2022 04:33 PM   Modules accepted: Orders

## 2022-12-18 NOTE — Telephone Encounter (Signed)
Called pt and advised. She will contact the office if she cannot tolerate abx.

## 2022-12-18 NOTE — Telephone Encounter (Signed)
Doxycyline antibiotics sent

## 2022-12-22 ENCOUNTER — Encounter: Payer: Self-pay | Admitting: Family Medicine

## 2022-12-22 NOTE — Progress Notes (Signed)
There is a fracture at the end of the second toe like we talked about in clinic.

## 2022-12-23 DIAGNOSIS — J3089 Other allergic rhinitis: Secondary | ICD-10-CM | POA: Diagnosis not present

## 2022-12-23 DIAGNOSIS — J301 Allergic rhinitis due to pollen: Secondary | ICD-10-CM | POA: Diagnosis not present

## 2022-12-23 DIAGNOSIS — J3081 Allergic rhinitis due to animal (cat) (dog) hair and dander: Secondary | ICD-10-CM | POA: Diagnosis not present

## 2022-12-23 NOTE — Telephone Encounter (Signed)
Forwarding to Dr. Corey as FYI.  

## 2022-12-24 DIAGNOSIS — Z8542 Personal history of malignant neoplasm of other parts of uterus: Secondary | ICD-10-CM | POA: Diagnosis not present

## 2022-12-24 DIAGNOSIS — Z1272 Encounter for screening for malignant neoplasm of vagina: Secondary | ICD-10-CM | POA: Diagnosis not present

## 2022-12-25 ENCOUNTER — Encounter: Payer: Self-pay | Admitting: Internal Medicine

## 2022-12-25 DIAGNOSIS — Z6824 Body mass index (BMI) 24.0-24.9, adult: Secondary | ICD-10-CM | POA: Diagnosis not present

## 2022-12-25 DIAGNOSIS — K589 Irritable bowel syndrome without diarrhea: Secondary | ICD-10-CM | POA: Insufficient documentation

## 2022-12-25 DIAGNOSIS — Z124 Encounter for screening for malignant neoplasm of cervix: Secondary | ICD-10-CM | POA: Diagnosis not present

## 2022-12-25 DIAGNOSIS — E8941 Symptomatic postprocedural ovarian failure: Secondary | ICD-10-CM | POA: Diagnosis not present

## 2022-12-30 DIAGNOSIS — J3089 Other allergic rhinitis: Secondary | ICD-10-CM | POA: Diagnosis not present

## 2022-12-30 DIAGNOSIS — J3081 Allergic rhinitis due to animal (cat) (dog) hair and dander: Secondary | ICD-10-CM | POA: Diagnosis not present

## 2022-12-30 DIAGNOSIS — J301 Allergic rhinitis due to pollen: Secondary | ICD-10-CM | POA: Diagnosis not present

## 2023-01-06 DIAGNOSIS — J3081 Allergic rhinitis due to animal (cat) (dog) hair and dander: Secondary | ICD-10-CM | POA: Diagnosis not present

## 2023-01-06 DIAGNOSIS — J301 Allergic rhinitis due to pollen: Secondary | ICD-10-CM | POA: Diagnosis not present

## 2023-01-06 DIAGNOSIS — J3089 Other allergic rhinitis: Secondary | ICD-10-CM | POA: Diagnosis not present

## 2023-01-12 DIAGNOSIS — J301 Allergic rhinitis due to pollen: Secondary | ICD-10-CM | POA: Diagnosis not present

## 2023-01-12 DIAGNOSIS — J4520 Mild intermittent asthma, uncomplicated: Secondary | ICD-10-CM | POA: Diagnosis not present

## 2023-01-12 DIAGNOSIS — J3081 Allergic rhinitis due to animal (cat) (dog) hair and dander: Secondary | ICD-10-CM | POA: Diagnosis not present

## 2023-01-12 DIAGNOSIS — J3089 Other allergic rhinitis: Secondary | ICD-10-CM | POA: Diagnosis not present

## 2023-01-21 DIAGNOSIS — J3089 Other allergic rhinitis: Secondary | ICD-10-CM | POA: Diagnosis not present

## 2023-01-21 DIAGNOSIS — J3081 Allergic rhinitis due to animal (cat) (dog) hair and dander: Secondary | ICD-10-CM | POA: Diagnosis not present

## 2023-01-21 DIAGNOSIS — J301 Allergic rhinitis due to pollen: Secondary | ICD-10-CM | POA: Diagnosis not present

## 2023-02-06 NOTE — Progress Notes (Unsigned)
[name] [address] [place] [address] Phone: [phone] Subjective:   [name], am serving as a scribe for Dr. Antoine Primas.  I'm seeing this patient by the request  of:  Plotnikov, Georgina Quint, MD  CC: Foot pain follow-up  [id number]  10/09/2022 Patient has responded extremely well to the facet and radial frequency ablation.  At this point patient has not been this pain-free for multiple years.  Will follow-up with me did discuss with her that there is a chance that he may need repeat injections in 3 years with a radiofrequency ablation   Update 02/09/2023 [name] is a 67 y.o. female coming in with complaint of lumbar spine and B foot pain. Patient had RFA in April 2024. Lower back is better but still has frequent L SI joint pain.   Saw Dr. Denyse Amass for R foot pinky toe fx and neuroma in L foot in June 2024. Toe is finally doing ok but is exacerbated her neuroma.   Also caught toe on doorway on Friday when her dog ran in front of her.     Past Medical History:  Diagnosis Date   ALLERGIC RHINITIS 03/02/2007   Anemia    ANXIETY 03/02/2007   Arthritis    ASTHMA 11/12/2007   Dr [name]   Cancer [facility])    skin, hx of   Dysrhythmia    pt. states at night will notice a different heart rhythem   Endometrial cancer (HCC)    FIBROCYSTIC BREAST DISEASE, HX OF 03/02/2007   GERD 03/02/2007   HYPOTHYROIDISM 11/12/2007   Dr Talmage Nap   Neuromuscular disorder (HCC)    carpel tunnel syndrome, compression of ulner nerve at elbows   OSTEOPENIA 11/12/2007   PEPTIC ULCER DISEASE 03/02/2007   Pneumonia    hx. of   Radiation 09/27/15-10/23/15   HDR to vaginal cuff 30 Gy   Stress fracture    TMJ SYNDROME 11/12/2007   Past Surgical History:  Procedure Laterality Date   2008 TMJ surgery      CARPAL TUNNEL WITH CUBITAL TUNNEL Left 07/2019   carpel tunnel Right 11/20/2016   colonoscopy x 2     cubital tunnel release Right 11/20/2016   deviated septum  surgery - 2007     DILATION AND CURETTAGE OF UTERUS     2017   endoscopy - 1990     Mandib advancement forward  2009   right knee meniscus Right 07/16/2022   ROBOTIC ASSISTED TOTAL HYSTERECTOMY WITH BILATERAL SALPINGO OOPHERECTOMY Bilateral 08/14/2015   Procedure: XI ROBOTIC ASSISTED TOTAL HYSTERECTOMY WITH BILATERAL SALPINGO OOPHORECTOMY AND SENTAL LYMPH NODE BIOPSY;  Surgeon: Adolphus Birchwood, MD;  Location: [facility];  Service: Gynecology;  Laterality: Bilateral;   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     Social History   Socioeconomic History   Marital status: Married    Spouse name: Not on file   Number of children: Not on file   Years of education: Not on file   Highest education level: Not on file  Occupational History   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[employer]  Tobacco Use   Smoking status: Never   Smokeless tobacco: Never   Tobacco comments:    Married, 1 child  Vaping Use   Vaping status: Never Used  Substance and Sexual Activity   Alcohol use: Yes    Alcohol/week: 2.0 standard drinks of alcohol    Types: 1 Glasses of wine, 1 Cans of  beer per week    Comment: daily   Drug use: No   Sexual activity: Yes  Other Topics Concern   Not on file  Social History Narrative   Not on file   Social Determinants of Health   Financial Resource Strain: Not on file  Food Insecurity: No Food Insecurity (11/27/2022)   Hunger Vital Sign    Worried About Running Out of Food in the Last Year: Never true    Ran Out of Food in the Last Year: Never true  Transportation Needs: No Transportation Needs (11/27/2022)   PRAPARE - Administrator, Civil Service (Medical): No    Lack of Transportation (Non-Medical): No  Physical Activity: Sufficiently Active (11/27/2022)   Exercise Vital Sign    Days of Exercise per Week: 7 days    Minutes of Exercise per Session: 30 min  Stress: No Stress Concern Present (11/27/2022)   [employer] of Occupational Health -  Occupational Stress Questionnaire    Feeling of Stress : Not at all  Social Connections: Socially Integrated (11/27/2022)   Social Connection and Isolation Panel [NHANES]    Frequency of Communication with Friends and Family: More than three times a week    Frequency of Social Gatherings with Friends and Family: More than three times a week    Attends Religious Services: More than 4 times per year    Active Member of [employer] or Organizations: Yes    Attends Engineer, structural: More than 4 times per year    Marital Status: Married   Allergies  Allergen Reactions   Ibuprofen Other (See Comments)    Gastritis    Shellfish Allergy Other (See Comments)    Abdominal pain and Diarrhea   Colchicine     diarrhea   Astroglide [Gyne-Moistrin] Other (See Comments)    Itching and burning   Demerol Nausea And Vomiting   Nsaids Nausea Only    Other Reaction(s): GI Intolerance   Penicillins Hives and Other (See Comments)    Face turned red and had a headache. Has patient had a PCN reaction causing immediate rash, facial/tongue/throat swelling, SOB or lightheadedness with hypotension: no Has patient had a PCN reaction causing severe rash involving mucus membranes or skin necrosis: no Has patient had a PCN reaction that required hospitalization: no Has patient had a PCN reaction occurring within the last 10 years: no If all of the above answers are "NO", then may proceed with Cephalosporin use.   Family History  Problem Relation Age of Onset   Hypertension Father    Heart disease Father 75       chf   Heart disease Mother 45       CAD   Hypertension Other    Asthma Neg Hx     Current Outpatient Medications (Endocrine & Metabolic):    levothyroxine (SYNTHROID, LEVOTHROID) 25 MCG tablet, Take 25-50 mcg by mouth daily. She takes one tablet daily 4 days per week (Monday-Thursday) and two tablets daily 3 days per week (Friday-Sunday).   liothyronine (CYTOMEL) 5 MCG tablet, Take 5-10  mcg by mouth 2 (two) times daily. She takes one tablet in the morning and two tablets midday.   zoledronic acid (RECLAST) 5 MG/100ML SOLN injection, Inject 5 mg into the vein. Once a year  Current Outpatient Medications (Cardiovascular):    EPIPEN 2-PAK 0.3 MG/0.3ML DEVI, Inject 0.3 mg into the muscle once. Reported on 09/03/2015  Current Outpatient Medications (Respiratory):    ARNUITY ELLIPTA 100  MCG/ACT AEPB, Inhale 1 puff into the lungs daily.   cetirizine (ZYRTEC) 5 MG tablet, Take 5 mg by mouth 2 (two) times daily as needed for allergies.   fluticasone (FLONASE) 50 MCG/ACT nasal spray, Place 1 spray into the nose daily as needed for allergies. Reported on 08/06/2015   montelukast (SINGULAIR) 10 MG tablet, Take 10 mg by mouth daily.  Current Outpatient Medications (Analgesics):    oxyCODONE (OXY IR/ROXICODONE) 5 MG immediate release tablet, 5 mg every 4 (four) hours as needed.   Current Outpatient Medications (Other):    Calcium Citrate-Vitamin D (CALCIUM CITRATE +D PO), Take 2 tablets by mouth daily.    Diclofenac Sodium 2 % SOLN, Place 2 g onto the skin 2 (two) times daily.   estradiol (ESTRACE) 0.1 MG/GM vaginal cream, PLACE 1 APPLICATORFUL VAGINALLY 3 (THREE) TIMES A WEEK   MELATONIN-CHAMOMILE PO, Take 1 capsule by mouth at bedtime as needed and may repeat dose one time if needed (For sleep.).    methocarbamol (ROBAXIN) 500 MG tablet, Take 1 tablet (500 mg total) by mouth every 8 (eight) hours as needed for muscle spasms.   METRONIDAZOLE, TOPICAL, 0.75 % LOTN, Apply 1 application topically at bedtime.   minoxidil (ROGAINE) 2 % external solution, Apply 1 application topically 4 (four) times a week.    Multiple Vitamin (MULTIVITAMIN WITH MINERALS) TABS tablet, Take 1 tablet by mouth daily.   pyridoxine (B-6) 200 MG tablet, Take 200 mg by mouth daily.   venlafaxine XR (EFFEXOR-XR) 37.5 MG 24 hr capsule, TAKE 1 CAPSULE BY MOUTH EVERY DAY WITH BREAKFAST (Patient taking differently: Take  37.5 mg by mouth. 3 times a week)   zolpidem (AMBIEN) 5 MG tablet, Take 1 tablet (5 mg total) by mouth at bedtime as needed for sleep.   Reviewed prior external information including notes and imaging from  primary care provider As well as notes that were available from care everywhere and other healthcare systems.  Past medical history, social, surgical and family history all reviewed in electronic medical record.  No pertanent information unless stated regarding to the chief complaint.   Review of Systems:  No headache, visual changes, nausea, vomiting, diarrhea, constipation, dizziness, abdominal pain, skin rash, fevers, chills, night sweats, weight loss, swollen lymph nodes, body aches, joint swelling, chest pain, shortness of breath, mood changes. POSITIVE muscle aches  Objective  Blood pressure 112/68, pulse 76, height 5\' 4"  (1.626 m), weight 143 lb (64.9 kg), SpO2 98%.   General: No apparent distress alert and oriented x3 mood and affect normal, dressed appropriately.  HEENT: Pupils equal, extraocular movements intact  Respiratory: Patient's speak in full sentences and does not appear short of breath  Cardiovascular: No lower extremity edema, non tender, no erythema   Patient's foot exam shows of breakdown of the transverse arch bilaterally.  Patient's fifth toe on the right foot does have some swelling noted.  Patient does have what appears to be a hematoma underneath the nailbed.  Limited muscular skeletal ultrasound was performed and interpreted by Antoine Primas, M  Limited ultrasound shows the patient does have hypoechoic changes between the third and fourth metatarsal heads on the right foot.  Patient does have increasing Doppler flow underneath the where the hematoma is on the fifth toe that could be corresponding to a possible cortical irregularity but difficult to assess.  Procedure: Real-time Ultrasound Guided Injection of right foot neuroma Device: GE Logiq Q7 Ultrasound  guided injection is preferred based studies that show increased duration, increased effect,  greater accuracy, decreased procedural pain, increased response rate, and decreased cost with ultrasound guided versus blind injection.  Verbal informed consent obtained.  Time-out conducted.  Noted no overlying erythema, induration, or other signs of local infection.  Skin prepped in a sterile fashion.  Local anesthesia: Topical Ethyl chloride.  With sterile technique and under real time ultrasound guidance: With a 25-gauge half inch needle injected with 0.5 cc of 0.5% Marcaine and 0.5 cc of Kenalog 40 mg/mL Completed without difficulty  Advised to call if fevers/chills, erythema, induration, drainage, or persistent bleeding.  Impression: Technically successful ultrasound guided injection.  Procedure: Real-time Ultrasound Guided Injection of left foot neuroma Device: GE Logiq Q7 Ultrasound guided injection is preferred based studies that show increased duration, increased effect, greater accuracy, decreased procedural pain, increased response rate, and decreased cost with ultrasound guided versus blind injection.  Verbal informed consent obtained.  Time-out conducted.  Noted no overlying erythema, induration, or other signs of local infection.  Skin prepped in a sterile fashion.  Local anesthesia: Topical Ethyl chloride.  With sterile technique and under real time ultrasound guidance: With a 25-gauge half inch needle injected with 0.5 cc of 0.5% Marcaine and 0.5 cc of Kenalog 40 mg/mL Completed without difficulty  Advised to call if fevers/chills, erythema, induration, drainage, or persistent bleeding.  Impression: Technically successful ultrasound guided injection.   Impression and Recommendations:     The above documentation has been reviewed and is accurate and complete Judi Saa, DO

## 2023-02-09 ENCOUNTER — Ambulatory Visit (INDEPENDENT_AMBULATORY_CARE_PROVIDER_SITE_OTHER): Payer: PPO | Admitting: Family Medicine

## 2023-02-09 ENCOUNTER — Other Ambulatory Visit: Payer: Self-pay

## 2023-02-09 VITALS — BP 112/68 | HR 76 | Ht 64.0 in | Wt 143.0 lb

## 2023-02-09 DIAGNOSIS — M79672 Pain in left foot: Secondary | ICD-10-CM | POA: Diagnosis not present

## 2023-02-09 DIAGNOSIS — G5781 Other specified mononeuropathies of right lower limb: Secondary | ICD-10-CM | POA: Insufficient documentation

## 2023-02-09 DIAGNOSIS — G5763 Lesion of plantar nerve, bilateral lower limbs: Secondary | ICD-10-CM | POA: Diagnosis not present

## 2023-02-09 DIAGNOSIS — G5762 Lesion of plantar nerve, left lower limb: Secondary | ICD-10-CM

## 2023-02-09 NOTE — Assessment & Plan Note (Signed)
Patient given injection and tolerated the procedure well, discussed icing regimen and home exercises, discussed which activities to do and which ones to avoid.  Increase activity slowly.  We discussed icing regimen.  Discussed proper shoes follow-up again in 6 to 8 weeks

## 2023-02-09 NOTE — Patient Instructions (Signed)
I will see what I can find See me again in 2months

## 2023-02-09 NOTE — Assessment & Plan Note (Addendum)
Patient given injection and tolerated the procedure well, discussed icing regimen and home exercises.  Discussed a carbon fiber plate.  We discussed the metatarsal pads that I think will also be beneficial.  Follow-up again in 6 to 8 weeks patient did decline x-rays today

## 2023-02-10 DIAGNOSIS — J301 Allergic rhinitis due to pollen: Secondary | ICD-10-CM | POA: Diagnosis not present

## 2023-02-10 DIAGNOSIS — J3089 Other allergic rhinitis: Secondary | ICD-10-CM | POA: Diagnosis not present

## 2023-02-10 DIAGNOSIS — J3081 Allergic rhinitis due to animal (cat) (dog) hair and dander: Secondary | ICD-10-CM | POA: Diagnosis not present

## 2023-02-12 DIAGNOSIS — M810 Age-related osteoporosis without current pathological fracture: Secondary | ICD-10-CM | POA: Diagnosis not present

## 2023-02-24 DIAGNOSIS — J3081 Allergic rhinitis due to animal (cat) (dog) hair and dander: Secondary | ICD-10-CM | POA: Diagnosis not present

## 2023-02-24 DIAGNOSIS — J3089 Other allergic rhinitis: Secondary | ICD-10-CM | POA: Diagnosis not present

## 2023-02-24 DIAGNOSIS — J301 Allergic rhinitis due to pollen: Secondary | ICD-10-CM | POA: Diagnosis not present

## 2023-03-05 DIAGNOSIS — J3081 Allergic rhinitis due to animal (cat) (dog) hair and dander: Secondary | ICD-10-CM | POA: Diagnosis not present

## 2023-03-05 DIAGNOSIS — J3089 Other allergic rhinitis: Secondary | ICD-10-CM | POA: Diagnosis not present

## 2023-03-05 DIAGNOSIS — J301 Allergic rhinitis due to pollen: Secondary | ICD-10-CM | POA: Diagnosis not present

## 2023-03-12 DIAGNOSIS — J301 Allergic rhinitis due to pollen: Secondary | ICD-10-CM | POA: Diagnosis not present

## 2023-03-12 DIAGNOSIS — J3081 Allergic rhinitis due to animal (cat) (dog) hair and dander: Secondary | ICD-10-CM | POA: Diagnosis not present

## 2023-03-12 DIAGNOSIS — J3089 Other allergic rhinitis: Secondary | ICD-10-CM | POA: Diagnosis not present

## 2023-03-17 NOTE — Progress Notes (Unsigned)
[name] [address] [place] [address] Phone: [phone] Subjective:   [name], am serving as a scribe for Dr. Antoine Primas.  I'm seeing this patient by the request  of:  Plotnikov, Georgina Quint, MD  CC: Foot pain  [id number]  02/09/2023 Patient given injection and tolerated the procedure well, discussed icing regimen and home exercises.  Discussed a carbon fiber plate.  We discussed the metatarsal pads that I think will also be beneficial.  Follow-up again in 6 to 8 weeks patient did decline x-rays today     Patient given injection and tolerated the procedure well, discussed icing regimen and home exercises, discussed which activities to do and which ones to avoid.  Increase activity slowly.  We discussed icing regimen.  Discussed proper shoes follow-up again in 6 to 8 weeks   Updated 03/18/2023 [name] is a 67 y.o. female coming in with complaint of bilateral foot pain.  Was seen previously for more of a normal months and did not extremities greater than a month ago.  Now states that she is having more left foot pain. Just got back from [place]. No pain with hiking. Since returning she has increased her cardio duration at the gym and did some yardwork requiring using a shovel. Feels like she has stress fracture in 3rd met on both feet R>L.  Would like to discuss K2 and D3 intake. Taking 5000IU and of K2 in one tablet. Carbon fiber plate in R shoe.        Past Medical History:  Diagnosis Date   ALLERGIC RHINITIS 03/02/2007   Anemia    ANXIETY 03/02/2007   Arthritis    ASTHMA 11/12/2007   Dr [name]   Cancer [facility])    skin, hx of   Dysrhythmia    pt. states at night will notice a different heart rhythem   Endometrial cancer (HCC)    FIBROCYSTIC BREAST DISEASE, HX OF 03/02/2007   GERD 03/02/2007   HYPOTHYROIDISM 11/12/2007   Dr Talmage Nap   Neuromuscular disorder (HCC)    carpel tunnel syndrome, compression of ulner nerve at  elbows   OSTEOPENIA 11/12/2007   PEPTIC ULCER DISEASE 03/02/2007   Pneumonia    hx. of   Radiation 09/27/15-10/23/15   HDR to vaginal cuff 30 Gy   Stress fracture    TMJ SYNDROME 11/12/2007   Past Surgical History:  Procedure Laterality Date   2008 TMJ surgery      CARPAL TUNNEL WITH CUBITAL TUNNEL Left 07/2019   carpel tunnel Right 11/20/2016   colonoscopy x 2     cubital tunnel release Right 11/20/2016   deviated septum surgery - 2007     DILATION AND CURETTAGE OF UTERUS     2017   endoscopy - 1990     Mandib advancement forward  2009   right knee meniscus Right 07/16/2022   ROBOTIC ASSISTED TOTAL HYSTERECTOMY WITH BILATERAL SALPINGO OOPHERECTOMY Bilateral 08/14/2015   Procedure: XI ROBOTIC ASSISTED TOTAL HYSTERECTOMY WITH BILATERAL SALPINGO OOPHORECTOMY AND SENTAL LYMPH NODE BIOPSY;  Surgeon: Adolphus Birchwood, MD;  Location: [facility];  Service: Gynecology;  Laterality: Bilateral;   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     Social History   Socioeconomic History   Marital status: Married    Spouse name: Not on file   Number of children: Not on file   Years of education: Not on file   Highest education level: Not on file  Occupational History   Occupation: Adult nurse at [employer]  Tobacco Use   Smoking status: Never   Smokeless tobacco: Never   Tobacco comments:    Married, 1 child  Vaping Use   Vaping status: Never Used  Substance and Sexual Activity   Alcohol use: Yes    Alcohol/week: 2.0 standard drinks of alcohol    Types: 1 Glasses of wine, 1 Cans of beer per week    Comment: daily   Drug use: No   Sexual activity: Yes  Other Topics Concern   Not on file  Social History Narrative   Not on file   Social Determinants of Health   Financial Resource Strain: Not on file  Food Insecurity: No Food Insecurity (11/27/2022)   Hunger Vital Sign    Worried About Running Out of Food in the Last Year: Never true    Ran Out of Food in the Last Year: Never  true  Transportation Needs: No Transportation Needs (11/27/2022)   PRAPARE - Administrator, Civil Service (Medical): No    Lack of Transportation (Non-Medical): No  Physical Activity: Sufficiently Active (11/27/2022)   Exercise Vital Sign    Days of Exercise per Week: 7 days    Minutes of Exercise per Session: 30 min  Stress: No Stress Concern Present (11/27/2022)   [employer] of Occupational Health - Occupational Stress Questionnaire    Feeling of Stress : Not at all  Social Connections: Socially Integrated (11/27/2022)   Social Connection and Isolation Panel [NHANES]    Frequency of Communication with Friends and Family: More than three times a week    Frequency of Social Gatherings with Friends and Family: More than three times a week    Attends Religious Services: More than 4 times per year    Active Member of [employer] or Organizations: Yes    Attends Engineer, structural: More than 4 times per year    Marital Status: Married   Allergies  Allergen Reactions   Ibuprofen Other (See Comments)    Gastritis    Shellfish Allergy Other (See Comments)    Abdominal pain and Diarrhea   Colchicine     diarrhea   Astroglide [Gyne-Moistrin] Other (See Comments)    Itching and burning   Demerol Nausea And Vomiting   Nsaids Nausea Only    Other Reaction(s): GI Intolerance   Penicillins Hives and Other (See Comments)    Face turned red and had a headache. Has patient had a PCN reaction causing immediate rash, facial/tongue/throat swelling, SOB or lightheadedness with hypotension: no Has patient had a PCN reaction causing severe rash involving mucus membranes or skin necrosis: no Has patient had a PCN reaction that required hospitalization: no Has patient had a PCN reaction occurring within the last 10 years: no If all of the above answers are "NO", then may proceed with Cephalosporin use.   Family History  Problem Relation Age of Onset   Hypertension Father     Heart disease Father 74       chf   Heart disease Mother 60       CAD   Hypertension Other    Asthma Neg Hx     Current Outpatient Medications (Endocrine & Metabolic):    levothyroxine (SYNTHROID, LEVOTHROID) 25 MCG tablet, Take 25-50 mcg by mouth daily. She takes one tablet daily 4 days per week (Monday-Thursday) and two tablets daily 3 days per week (Friday-Sunday).   liothyronine (CYTOMEL) 5 MCG tablet, Take  5-10 mcg by mouth 2 (two) times daily. She takes one tablet in the morning and two tablets midday.   zoledronic acid (RECLAST) 5 MG/100ML SOLN injection, Inject 5 mg into the vein. Once a year  Current Outpatient Medications (Cardiovascular):    EPIPEN 2-PAK 0.3 MG/0.3ML DEVI, Inject 0.3 mg into the muscle once. Reported on 09/03/2015  Current Outpatient Medications (Respiratory):    ARNUITY ELLIPTA 100 MCG/ACT AEPB, Inhale 1 puff into the lungs daily.   cetirizine (ZYRTEC) 5 MG tablet, Take 5 mg by mouth 2 (two) times daily as needed for allergies.   fluticasone (FLONASE) 50 MCG/ACT nasal spray, Place 1 spray into the nose daily as needed for allergies. Reported on 08/06/2015   montelukast (SINGULAIR) 10 MG tablet, Take 10 mg by mouth daily.  Current Outpatient Medications (Analgesics):    oxyCODONE (OXY IR/ROXICODONE) 5 MG immediate release tablet, 5 mg every 4 (four) hours as needed.   Current Outpatient Medications (Other):    Calcium Citrate-Vitamin D (CALCIUM CITRATE +D PO), Take 2 tablets by mouth daily.    Diclofenac Sodium 2 % SOLN, Place 2 g onto the skin 2 (two) times daily.   estradiol (ESTRACE) 0.1 MG/GM vaginal cream, PLACE 1 APPLICATORFUL VAGINALLY 3 (THREE) TIMES A WEEK   MELATONIN-CHAMOMILE PO, Take 1 capsule by mouth at bedtime as needed and may repeat dose one time if needed (For sleep.).    methocarbamol (ROBAXIN) 500 MG tablet, Take 1 tablet (500 mg total) by mouth every 8 (eight) hours as needed for muscle spasms.   METRONIDAZOLE, TOPICAL, 0.75 % LOTN, Apply 1  application topically at bedtime.   minoxidil (ROGAINE) 2 % external solution, Apply 1 application topically 4 (four) times a week.    Multiple Vitamin (MULTIVITAMIN WITH MINERALS) TABS tablet, Take 1 tablet by mouth daily.   pyridoxine (B-6) 200 MG tablet, Take 200 mg by mouth daily.   venlafaxine XR (EFFEXOR-XR) 37.5 MG 24 hr capsule, TAKE 1 CAPSULE BY MOUTH EVERY DAY WITH BREAKFAST (Patient taking differently: Take 37.5 mg by mouth. 3 times a week)   Vitamin D, Ergocalciferol, (DRISDOL) 1.25 MG (50000 UNIT) CAPS capsule, Take 1 capsule (50,000 Units total) by mouth every 7 (seven) days.   zolpidem (AMBIEN) 5 MG tablet, Take 1 tablet (5 mg total) by mouth at bedtime as needed for sleep.   Reviewed prior external information including notes and imaging from  primary care provider As well as notes that were available from care everywhere and other healthcare systems.  Past medical history, social, surgical and family history all reviewed in electronic medical record.  No pertanent information unless stated regarding to the chief complaint.   Review of Systems:  No headache, visual changes, nausea, vomiting, diarrhea, constipation, dizziness, abdominal pain, skin rash, fevers, chills, night sweats, weight loss, swollen lymph nodes, body aches, joint swelling, chest pain, shortness of breath, mood changes. POSITIVE muscle aches  Objective  Blood pressure 110/78, height 5\' 4"  (1.626 m).   General: No apparent distress alert and oriented x3 mood and affect normal, dressed appropriately.  HEENT: Pupils equal, extraocular movements intact  Respiratory: Patient's speak in full sentences and does not appear short of breath  Cardiovascular: No lower extremity edema, non tender, no erythema  Foot exam shows patient does have some breakdown of the transverse arch noted.  Tender to palpation over the fifth metatarsal bilaterally but more significantly worse on the right than the left.  No significant  swelling noted today.  Limited muscular skeletal ultrasound  was performed and interpreted by Antoine Primas, M  Limited ultrasound does show that there is some cortical thickening noted over the right third metatarsal.  Neuroma noted but very small compared to what it was previously.    Impression and Recommendations:    The above documentation has been reviewed and is accurate and complete Judi Saa, DO

## 2023-03-18 ENCOUNTER — Other Ambulatory Visit: Payer: Self-pay

## 2023-03-18 ENCOUNTER — Encounter: Payer: Self-pay | Admitting: Family Medicine

## 2023-03-18 ENCOUNTER — Ambulatory Visit (INDEPENDENT_AMBULATORY_CARE_PROVIDER_SITE_OTHER): Payer: PPO | Admitting: Family Medicine

## 2023-03-18 VITALS — BP 110/78 | Ht 64.0 in

## 2023-03-18 DIAGNOSIS — M79672 Pain in left foot: Secondary | ICD-10-CM

## 2023-03-18 DIAGNOSIS — M84374A Stress fracture, right foot, initial encounter for fracture: Secondary | ICD-10-CM | POA: Diagnosis not present

## 2023-03-18 DIAGNOSIS — J3089 Other allergic rhinitis: Secondary | ICD-10-CM | POA: Diagnosis not present

## 2023-03-18 DIAGNOSIS — J3081 Allergic rhinitis due to animal (cat) (dog) hair and dander: Secondary | ICD-10-CM | POA: Diagnosis not present

## 2023-03-18 DIAGNOSIS — J301 Allergic rhinitis due to pollen: Secondary | ICD-10-CM | POA: Diagnosis not present

## 2023-03-18 MED ORDER — VITAMIN D (ERGOCALCIFEROL) 1.25 MG (50000 UNIT) PO CAPS: 50000.0000 [IU] | ORAL_CAPSULE | ORAL | 0 refills | Status: DC

## 2023-03-18 NOTE — Assessment & Plan Note (Addendum)
More of a stress reaction.  Patient does have the osteoporosis.  Still concerned though that patient may not be on the right treatment.  Will talk to the endocrinologist about potentially switching to either antiestrogen type medication versus the possibility with Prolia.  Discussed proper shoes and icing regimen.  Increase activity slowly.  Follow-up again in 4 weeks.  Vitamin D prescribed as well.

## 2023-03-18 NOTE — Patient Instructions (Addendum)
Vit D prescribed K2 for 1-2 months only Rigid sole shoes In 2 weeks introduce more gym, deep water only now See you again in 4-5 weeks

## 2023-03-19 DIAGNOSIS — R102 Pelvic and perineal pain: Secondary | ICD-10-CM | POA: Diagnosis not present

## 2023-03-19 DIAGNOSIS — K5900 Constipation, unspecified: Secondary | ICD-10-CM | POA: Diagnosis not present

## 2023-03-19 DIAGNOSIS — N941 Unspecified dyspareunia: Secondary | ICD-10-CM | POA: Diagnosis not present

## 2023-03-19 DIAGNOSIS — M6281 Muscle weakness (generalized): Secondary | ICD-10-CM | POA: Diagnosis not present

## 2023-03-19 DIAGNOSIS — N3946 Mixed incontinence: Secondary | ICD-10-CM | POA: Diagnosis not present

## 2023-03-19 DIAGNOSIS — M62838 Other muscle spasm: Secondary | ICD-10-CM | POA: Diagnosis not present

## 2023-03-26 DIAGNOSIS — J3089 Other allergic rhinitis: Secondary | ICD-10-CM | POA: Diagnosis not present

## 2023-03-26 DIAGNOSIS — J301 Allergic rhinitis due to pollen: Secondary | ICD-10-CM | POA: Diagnosis not present

## 2023-03-26 DIAGNOSIS — J3081 Allergic rhinitis due to animal (cat) (dog) hair and dander: Secondary | ICD-10-CM | POA: Diagnosis not present

## 2023-04-01 DIAGNOSIS — J3089 Other allergic rhinitis: Secondary | ICD-10-CM | POA: Diagnosis not present

## 2023-04-01 DIAGNOSIS — J301 Allergic rhinitis due to pollen: Secondary | ICD-10-CM | POA: Diagnosis not present

## 2023-04-07 NOTE — Progress Notes (Signed)
[name] [address] [place] [address] Phone: [phone] Subjective:   [name], am serving as a scribe for Dr. Antoine Primas.  I'm seeing this patient by the request  of:  Plotnikov, Georgina Quint, MD  CC: Right foot pain follow-up  [id number]  03/18/2023 More of a stress reaction.  Patient does have the osteoporosis.  Still concerned though that patient may not be on the right treatment.  Will talk to the endocrinologist about potentially switching to either antiestrogen type medication versus the possibility with Prolia.  Discussed proper shoes and icing regimen.  Increase activity slowly.  Follow-up again in 4 weeks.  Vitamin D prescribed as well.      Update 04/14/2023 BELLAMARIE PFLUG is a 67 y.o. female coming in with complaint of R foot pain. Patient states pain from fx feels better. But pinched nerve feeling still there from time to time. No new concerns. Patient still has more of the tingling and the numbness than anything else at the time.     Past Medical History:  Diagnosis Date   ALLERGIC RHINITIS 03/02/2007   Anemia    ANXIETY 03/02/2007   Arthritis    ASTHMA 11/12/2007   Dr [name]   Cancer [facility])    skin, hx of   Dysrhythmia    pt. states at night will notice a different heart rhythem   Endometrial cancer (HCC)    FIBROCYSTIC BREAST DISEASE, HX OF 03/02/2007   GERD 03/02/2007   HYPOTHYROIDISM 11/12/2007   Dr Talmage Nap   Neuromuscular disorder (HCC)    carpel tunnel syndrome, compression of ulner nerve at elbows   OSTEOPENIA 11/12/2007   PEPTIC ULCER DISEASE 03/02/2007   Pneumonia    hx. of   Radiation 09/27/15-10/23/15   HDR to vaginal cuff 30 Gy   Stress fracture    TMJ SYNDROME 11/12/2007   Past Surgical History:  Procedure Laterality Date   2008 TMJ surgery      CARPAL TUNNEL WITH CUBITAL TUNNEL Left 07/2019   carpel tunnel Right 11/20/2016   colonoscopy x 2     cubital tunnel release Right 11/20/2016   deviated  septum surgery - 2007     DILATION AND CURETTAGE OF UTERUS     2017   endoscopy - 1990     Mandib advancement forward  2009   right knee meniscus Right 07/16/2022   ROBOTIC ASSISTED TOTAL HYSTERECTOMY WITH BILATERAL SALPINGO OOPHERECTOMY Bilateral 08/14/2015   Procedure: XI ROBOTIC ASSISTED TOTAL HYSTERECTOMY WITH BILATERAL SALPINGO OOPHORECTOMY AND SENTAL LYMPH NODE BIOPSY;  Surgeon: Adolphus Birchwood, MD;  Location: [facility];  Service: Gynecology;  Laterality: Bilateral;   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     Social History   Socioeconomic History   Marital status: Married    Spouse name: Not on file   Number of children: Not on file   Years of education: Not on file   Highest education level: Not on file  Occupational History   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[employer]  Tobacco Use   Smoking status: Never   Smokeless tobacco: Never   Tobacco comments:    Married, 1 child  Vaping Use   Vaping status: Never Used  Substance and Sexual Activity   Alcohol use: Yes    Alcohol/week: 2.0 standard drinks of alcohol    Types: 1 Glasses of wine, 1 Cans of beer per week    Comment: daily   Drug use: No  Sexual activity: Yes  Other Topics Concern   Not on file  Social History Narrative   Not on file   Social Determinants of Health   Financial Resource Strain: Not on file  Food Insecurity: No Food Insecurity (11/27/2022)   Hunger Vital Sign    Worried About Running Out of Food in the Last Year: Never true    Ran Out of Food in the Last Year: Never true  Transportation Needs: No Transportation Needs (11/27/2022)   PRAPARE - Administrator, Civil Service (Medical): No    Lack of Transportation (Non-Medical): No  Physical Activity: Sufficiently Active (11/27/2022)   Exercise Vital Sign    Days of Exercise per Week: 7 days    Minutes of Exercise per Session: 30 min  Stress: No Stress Concern Present (11/27/2022)   [employer] of Occupational Health -  Occupational Stress Questionnaire    Feeling of Stress : Not at all  Social Connections: Socially Integrated (11/27/2022)   Social Connection and Isolation Panel [NHANES]    Frequency of Communication with Friends and Family: More than three times a week    Frequency of Social Gatherings with Friends and Family: More than three times a week    Attends Religious Services: More than 4 times per year    Active Member of [employer] or Organizations: Yes    Attends Engineer, structural: More than 4 times per year    Marital Status: Married   Allergies  Allergen Reactions   Ibuprofen Other (See Comments)    Gastritis    Shellfish Allergy Other (See Comments)    Abdominal pain and Diarrhea   Colchicine     diarrhea   Astroglide [Gyne-Moistrin] Other (See Comments)    Itching and burning   Demerol Nausea And Vomiting   Nsaids Nausea Only    Other Reaction(s): GI Intolerance   Penicillins Hives and Other (See Comments)    Face turned red and had a headache. Has patient had a PCN reaction causing immediate rash, facial/tongue/throat swelling, SOB or lightheadedness with hypotension: no Has patient had a PCN reaction causing severe rash involving mucus membranes or skin necrosis: no Has patient had a PCN reaction that required hospitalization: no Has patient had a PCN reaction occurring within the last 10 years: no If all of the above answers are "NO", then may proceed with Cephalosporin use.   Family History  Problem Relation Age of Onset   Hypertension Father    Heart disease Father 50       chf   Heart disease Mother 31       CAD   Hypertension Other    Asthma Neg Hx     Current Outpatient Medications (Endocrine & Metabolic):    levothyroxine (SYNTHROID, LEVOTHROID) 25 MCG tablet, Take 25-50 mcg by mouth daily. She takes one tablet daily 4 days per week (Monday-Thursday) and two tablets daily 3 days per week (Friday-Sunday).   liothyronine (CYTOMEL) 5 MCG tablet, Take 5-10  mcg by mouth 2 (two) times daily. She takes one tablet in the morning and two tablets midday.   zoledronic acid (RECLAST) 5 MG/100ML SOLN injection, Inject 5 mg into the vein. Once a year  Current Outpatient Medications (Cardiovascular):    EPIPEN 2-PAK 0.3 MG/0.3ML DEVI, Inject 0.3 mg into the muscle once. Reported on 09/03/2015  Current Outpatient Medications (Respiratory):    ARNUITY ELLIPTA 100 MCG/ACT AEPB, Inhale 1 puff into the lungs daily.   cetirizine (ZYRTEC) 5 MG  tablet, Take 5 mg by mouth 2 (two) times daily as needed for allergies.   fluticasone (FLONASE) 50 MCG/ACT nasal spray, Place 1 spray into the nose daily as needed for allergies. Reported on 08/06/2015   montelukast (SINGULAIR) 10 MG tablet, Take 10 mg by mouth daily.  Current Outpatient Medications (Analgesics):    oxyCODONE (OXY IR/ROXICODONE) 5 MG immediate release tablet, 5 mg every 4 (four) hours as needed.   Current Outpatient Medications (Other):    Calcium Citrate-Vitamin D (CALCIUM CITRATE +D PO), Take 2 tablets by mouth daily.    Diclofenac Sodium 2 % SOLN, Place 2 g onto the skin 2 (two) times daily.   estradiol (ESTRACE) 0.1 MG/GM vaginal cream, PLACE 1 APPLICATORFUL VAGINALLY 3 (THREE) TIMES A WEEK   MELATONIN-CHAMOMILE PO, Take 1 capsule by mouth at bedtime as needed and may repeat dose one time if needed (For sleep.).    methocarbamol (ROBAXIN) 500 MG tablet, Take 1 tablet (500 mg total) by mouth every 8 (eight) hours as needed for muscle spasms.   METRONIDAZOLE, TOPICAL, 0.75 % LOTN, Apply 1 application topically at bedtime.   minoxidil (ROGAINE) 2 % external solution, Apply 1 application topically 4 (four) times a week.    Multiple Vitamin (MULTIVITAMIN WITH MINERALS) TABS tablet, Take 1 tablet by mouth daily.   pyridoxine (B-6) 200 MG tablet, Take 200 mg by mouth daily.   venlafaxine XR (EFFEXOR-XR) 37.5 MG 24 hr capsule, TAKE 1 CAPSULE BY MOUTH EVERY DAY WITH BREAKFAST (Patient taking differently: Take  37.5 mg by mouth. 3 times a week)   Vitamin D, Ergocalciferol, (DRISDOL) 1.25 MG (50000 UNIT) CAPS capsule, Take 1 capsule (50,000 Units total) by mouth every 7 (seven) days.   zolpidem (AMBIEN) 5 MG tablet, Take 1 tablet (5 mg total) by mouth at bedtime as needed for sleep.   Reviewed prior external information including notes and imaging from  primary care provider As well as notes that were available from care everywhere and other healthcare systems.  Past medical history, social, surgical and family history all reviewed in electronic medical record.  No pertanent information unless stated regarding to the chief complaint.   Review of Systems:  No headache, visual changes, nausea, vomiting, diarrhea, constipation, dizziness, abdominal pain, skin rash, fevers, chills, night sweats, weight loss, swollen lymph nodes, body aches, joint swelling, chest pain, shortness of breath, mood changes. POSITIVE muscle aches  Objective  Blood pressure 118/82, pulse 76, height 5\' 4"  (1.626 m), weight 140 lb (63.5 kg), SpO2 97%.   General: No apparent distress alert and oriented x3 mood and affect normal, dressed appropriately.  HEENT: Pupils equal, extraocular movements intact  Respiratory: Patient's speak in full sentences and does not appear short of breath  Cardiovascular: No lower extremity edema, non tender, no erythema  Patient does have on the foot a breakdown of the transverse and longitudinal arch.  Positive squeeze test noted.  More pain between the third and fourth metatarsal heads.  Procedure: Real-time Ultrasound Guided Injection of the right neuroma Device: GE Logiq Q7 Ultrasound guided injection is preferred based studies that show increased duration, increased effect, greater accuracy, decreased procedural pain, increased response rate, and decreased cost with ultrasound guided versus blind injection.  Verbal informed consent obtained.  Time-out conducted.  Noted no overlying erythema,  induration, or other signs of local infection.  Skin prepped in a sterile fashion.  Local anesthesia: Topical Ethyl chloride.  With sterile technique and under real time ultrasound guidance: With a 25-gauge half  inch needle injected with 0.5 cc of 0.5% Marcaine and 0.5 cc of Kenalog 40 mg/mL Completed without difficulty  Pain immediately resolved suggesting accurate placement of the medication.  Advised to call if fevers/chills, erythema, induration, drainage, or persistent bleeding.  Impression: Technically successful ultrasound guided injection.    Impression and Recommendations:     The above documentation has been reviewed and is accurate and complete Judi Saa, DO

## 2023-04-14 ENCOUNTER — Encounter: Payer: Self-pay | Admitting: Family Medicine

## 2023-04-14 ENCOUNTER — Other Ambulatory Visit: Payer: Self-pay

## 2023-04-14 ENCOUNTER — Ambulatory Visit: Payer: PPO | Admitting: Family Medicine

## 2023-04-14 VITALS — BP 118/82 | HR 76 | Ht 64.0 in | Wt 140.0 lb

## 2023-04-14 DIAGNOSIS — J3089 Other allergic rhinitis: Secondary | ICD-10-CM | POA: Diagnosis not present

## 2023-04-14 DIAGNOSIS — G5781 Other specified mononeuropathies of right lower limb: Secondary | ICD-10-CM | POA: Diagnosis not present

## 2023-04-14 DIAGNOSIS — J3081 Allergic rhinitis due to animal (cat) (dog) hair and dander: Secondary | ICD-10-CM | POA: Diagnosis not present

## 2023-04-14 DIAGNOSIS — J301 Allergic rhinitis due to pollen: Secondary | ICD-10-CM | POA: Diagnosis not present

## 2023-04-14 NOTE — Assessment & Plan Note (Signed)
Hopefully patient will do well with this.  Discussed with patient icing regimen and home exercises.  Do not see any of the stress reactions at the moment that does seem to make this better hopefully.  Discussed avoiding certain activities.  Will increase activity slowly.  Can supplement with fiber plate.  Follow-up with me again in 6 to 8 weeks otherwise.

## 2023-04-14 NOTE — Patient Instructions (Addendum)
Injection in foot today Okay to do toe intrinsics in 10 days Continue everything else See you again in 2-3 months

## 2023-04-21 DIAGNOSIS — R102 Pelvic and perineal pain: Secondary | ICD-10-CM | POA: Diagnosis not present

## 2023-04-21 DIAGNOSIS — J3089 Other allergic rhinitis: Secondary | ICD-10-CM | POA: Diagnosis not present

## 2023-04-21 DIAGNOSIS — N941 Unspecified dyspareunia: Secondary | ICD-10-CM | POA: Diagnosis not present

## 2023-04-21 DIAGNOSIS — M62838 Other muscle spasm: Secondary | ICD-10-CM | POA: Diagnosis not present

## 2023-04-21 DIAGNOSIS — M6281 Muscle weakness (generalized): Secondary | ICD-10-CM | POA: Diagnosis not present

## 2023-04-21 DIAGNOSIS — N3946 Mixed incontinence: Secondary | ICD-10-CM | POA: Diagnosis not present

## 2023-04-21 DIAGNOSIS — J3081 Allergic rhinitis due to animal (cat) (dog) hair and dander: Secondary | ICD-10-CM | POA: Diagnosis not present

## 2023-04-21 DIAGNOSIS — K5900 Constipation, unspecified: Secondary | ICD-10-CM | POA: Diagnosis not present

## 2023-04-21 DIAGNOSIS — J301 Allergic rhinitis due to pollen: Secondary | ICD-10-CM | POA: Diagnosis not present

## 2023-04-28 DIAGNOSIS — J301 Allergic rhinitis due to pollen: Secondary | ICD-10-CM | POA: Diagnosis not present

## 2023-04-28 DIAGNOSIS — J3089 Other allergic rhinitis: Secondary | ICD-10-CM | POA: Diagnosis not present

## 2023-04-28 DIAGNOSIS — J3081 Allergic rhinitis due to animal (cat) (dog) hair and dander: Secondary | ICD-10-CM | POA: Diagnosis not present

## 2023-05-05 DIAGNOSIS — R102 Pelvic and perineal pain: Secondary | ICD-10-CM | POA: Diagnosis not present

## 2023-05-05 DIAGNOSIS — N3946 Mixed incontinence: Secondary | ICD-10-CM | POA: Diagnosis not present

## 2023-05-05 DIAGNOSIS — J3089 Other allergic rhinitis: Secondary | ICD-10-CM | POA: Diagnosis not present

## 2023-05-05 DIAGNOSIS — N941 Unspecified dyspareunia: Secondary | ICD-10-CM | POA: Diagnosis not present

## 2023-05-05 DIAGNOSIS — M6281 Muscle weakness (generalized): Secondary | ICD-10-CM | POA: Diagnosis not present

## 2023-05-05 DIAGNOSIS — M62838 Other muscle spasm: Secondary | ICD-10-CM | POA: Diagnosis not present

## 2023-05-05 DIAGNOSIS — K5900 Constipation, unspecified: Secondary | ICD-10-CM | POA: Diagnosis not present

## 2023-05-05 DIAGNOSIS — J301 Allergic rhinitis due to pollen: Secondary | ICD-10-CM | POA: Diagnosis not present

## 2023-05-05 DIAGNOSIS — J3081 Allergic rhinitis due to animal (cat) (dog) hair and dander: Secondary | ICD-10-CM | POA: Diagnosis not present

## 2023-05-12 DIAGNOSIS — J3089 Other allergic rhinitis: Secondary | ICD-10-CM | POA: Diagnosis not present

## 2023-05-12 DIAGNOSIS — J3081 Allergic rhinitis due to animal (cat) (dog) hair and dander: Secondary | ICD-10-CM | POA: Diagnosis not present

## 2023-05-12 DIAGNOSIS — J301 Allergic rhinitis due to pollen: Secondary | ICD-10-CM | POA: Diagnosis not present

## 2023-05-19 DIAGNOSIS — R102 Pelvic and perineal pain: Secondary | ICD-10-CM | POA: Diagnosis not present

## 2023-05-19 DIAGNOSIS — M6281 Muscle weakness (generalized): Secondary | ICD-10-CM | POA: Diagnosis not present

## 2023-05-19 DIAGNOSIS — K5900 Constipation, unspecified: Secondary | ICD-10-CM | POA: Diagnosis not present

## 2023-05-19 DIAGNOSIS — N941 Unspecified dyspareunia: Secondary | ICD-10-CM | POA: Diagnosis not present

## 2023-05-19 DIAGNOSIS — M62838 Other muscle spasm: Secondary | ICD-10-CM | POA: Diagnosis not present

## 2023-05-21 DIAGNOSIS — J301 Allergic rhinitis due to pollen: Secondary | ICD-10-CM | POA: Diagnosis not present

## 2023-05-21 DIAGNOSIS — J3081 Allergic rhinitis due to animal (cat) (dog) hair and dander: Secondary | ICD-10-CM | POA: Diagnosis not present

## 2023-05-21 DIAGNOSIS — J3089 Other allergic rhinitis: Secondary | ICD-10-CM | POA: Diagnosis not present

## 2023-06-02 ENCOUNTER — Encounter: Payer: Self-pay | Admitting: Family Medicine

## 2023-06-02 DIAGNOSIS — M84374A Stress fracture, right foot, initial encounter for fracture: Secondary | ICD-10-CM

## 2023-06-03 DIAGNOSIS — J3081 Allergic rhinitis due to animal (cat) (dog) hair and dander: Secondary | ICD-10-CM | POA: Diagnosis not present

## 2023-06-03 DIAGNOSIS — J3089 Other allergic rhinitis: Secondary | ICD-10-CM | POA: Diagnosis not present

## 2023-06-03 DIAGNOSIS — J301 Allergic rhinitis due to pollen: Secondary | ICD-10-CM | POA: Diagnosis not present

## 2023-06-08 ENCOUNTER — Encounter: Payer: Self-pay | Admitting: Internal Medicine

## 2023-06-11 DIAGNOSIS — J301 Allergic rhinitis due to pollen: Secondary | ICD-10-CM | POA: Diagnosis not present

## 2023-06-11 DIAGNOSIS — J3089 Other allergic rhinitis: Secondary | ICD-10-CM | POA: Diagnosis not present

## 2023-06-11 DIAGNOSIS — J3081 Allergic rhinitis due to animal (cat) (dog) hair and dander: Secondary | ICD-10-CM | POA: Diagnosis not present

## 2023-06-15 NOTE — Progress Notes (Unsigned)
[name] [address] [place] [address] Phone: [phone] Subjective:   [name], am serving as a scribe for Dr. Antoine Primas.  I'm seeing this patient by the request  of:  Plotnikov, Georgina Quint, MD  CC: right foot pain   [id number]  04/14/2023 Hopefully patient will do well with this.  Discussed with patient icing regimen and home exercises.  Do not see any of the stress reactions at the moment that does seem to make this better hopefully.  Discussed avoiding certain activities.  Will increase activity slowly.  Can supplement with fiber plate.  Follow-up with me again in 6 to 8 weeks otherwise.      Update 06/16/2023 [name] is a 67 y.o. female coming in with complaint of R foot pain. Patient states same per usual. No changes. Here to talk about foot and spine. Referenced previous messages. Patient continues to have pain that seems to radiate down the left leg to a certain degree.  Going to the foot.  Patient does not know if the foot is secondary to the neuroma or the potential for the back pain.  Starting to affect daily activities on a much more regular basis.     Past Medical History:  Diagnosis Date   ALLERGIC RHINITIS 03/02/2007   Anemia    ANXIETY 03/02/2007   Arthritis    ASTHMA 11/12/2007   Dr [name]   Cancer [facility])    skin, hx of   Dysrhythmia    pt. states at night will notice a different heart rhythem   Endometrial cancer (HCC)    FIBROCYSTIC BREAST DISEASE, HX OF 03/02/2007   GERD 03/02/2007   HYPOTHYROIDISM 11/12/2007   Dr Talmage Nap   Neuromuscular disorder (HCC)    carpel tunnel syndrome, compression of ulner nerve at elbows   OSTEOPENIA 11/12/2007   PEPTIC ULCER DISEASE 03/02/2007   Pneumonia    hx. of   Radiation 09/27/15-10/23/15   HDR to vaginal cuff 30 Gy   Stress fracture    TMJ SYNDROME 11/12/2007   Past Surgical History:  Procedure Laterality Date   2008 TMJ surgery      CARPAL TUNNEL WITH CUBITAL  TUNNEL Left 07/2019   carpel tunnel Right 11/20/2016   colonoscopy x 2     cubital tunnel release Right 11/20/2016   deviated septum surgery - 2007     DILATION AND CURETTAGE OF UTERUS     2017   endoscopy - 1990     Mandib advancement forward  2009   right knee meniscus Right 07/16/2022   ROBOTIC ASSISTED TOTAL HYSTERECTOMY WITH BILATERAL SALPINGO OOPHERECTOMY Bilateral 08/14/2015   Procedure: XI ROBOTIC ASSISTED TOTAL HYSTERECTOMY WITH BILATERAL SALPINGO OOPHORECTOMY AND SENTAL LYMPH NODE BIOPSY;  Surgeon: Adolphus Birchwood, MD;  Location: [facility];  Service: Gynecology;  Laterality: Bilateral;   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     Social History   Socioeconomic History   Marital status: Married    Spouse name: Not on file   Number of children: Not on file   Years of education: Not on file   Highest education level: Not on file  Occupational History   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[employer]  Tobacco Use   Smoking status: Never   Smokeless tobacco: Never   Tobacco comments:    Married, 1 child  Vaping Use   Vaping status: Never Used  Substance and Sexual Activity   Alcohol use: Yes  Alcohol/week: 2.0 standard drinks of alcohol    Types: 1 Glasses of wine, 1 Cans of beer per week    Comment: daily   Drug use: No   Sexual activity: Yes  Other Topics Concern   Not on file  Social History Narrative   Not on file   Social Drivers of Health   Financial Resource Strain: Not on file  Food Insecurity: No Food Insecurity (11/27/2022)   Hunger Vital Sign    Worried About Running Out of Food in the Last Year: Never true    Ran Out of Food in the Last Year: Never true  Transportation Needs: No Transportation Needs (11/27/2022)   PRAPARE - Administrator, Civil Service (Medical): No    Lack of Transportation (Non-Medical): No  Physical Activity: Sufficiently Active (11/27/2022)   Exercise Vital Sign    Days of Exercise per Week: 7 days    Minutes  of Exercise per Session: 30 min  Stress: No Stress Concern Present (11/27/2022)   [employer] of Occupational Health - Occupational Stress Questionnaire    Feeling of Stress : Not at all  Social Connections: Socially Integrated (11/27/2022)   Social Connection and Isolation Panel [NHANES]    Frequency of Communication with Friends and Family: More than three times a week    Frequency of Social Gatherings with Friends and Family: More than three times a week    Attends Religious Services: More than 4 times per year    Active Member of [employer] or Organizations: Yes    Attends Engineer, structural: More than 4 times per year    Marital Status: Married   Allergies  Allergen Reactions   Ibuprofen Other (See Comments)    Gastritis    Shellfish Allergy Other (See Comments)    Abdominal pain and Diarrhea   Colchicine     diarrhea   Astroglide [Gyne-Moistrin] Other (See Comments)    Itching and burning   Demerol Nausea And Vomiting   Nsaids Nausea Only    Other Reaction(s): GI Intolerance   Penicillins Hives and Other (See Comments)    Face turned red and had a headache. Has patient had a PCN reaction causing immediate rash, facial/tongue/throat swelling, SOB or lightheadedness with hypotension: no Has patient had a PCN reaction causing severe rash involving mucus membranes or skin necrosis: no Has patient had a PCN reaction that required hospitalization: no Has patient had a PCN reaction occurring within the last 10 years: no If all of the above answers are "NO", then may proceed with Cephalosporin use.   Family History  Problem Relation Age of Onset   Hypertension Father    Heart disease Father 35       chf   Heart disease Mother 61       CAD   Hypertension Other    Asthma Neg Hx     Current Outpatient Medications (Endocrine & Metabolic):    levothyroxine (SYNTHROID, LEVOTHROID) 25 MCG tablet, Take 25-50 mcg by mouth daily. She takes one tablet daily 4 days per week  (Monday-Thursday) and two tablets daily 3 days per week (Friday-Sunday).   liothyronine (CYTOMEL) 5 MCG tablet, Take 5-10 mcg by mouth 2 (two) times daily. She takes one tablet in the morning and two tablets midday.   zoledronic acid (RECLAST) 5 MG/100ML SOLN injection, Inject 5 mg into the vein. Once a year  Current Outpatient Medications (Cardiovascular):    EPIPEN 2-PAK 0.3 MG/0.3ML DEVI, Inject 0.3 mg into  the muscle once. Reported on 09/03/2015  Current Outpatient Medications (Respiratory):    ARNUITY ELLIPTA 100 MCG/ACT AEPB, Inhale 1 puff into the lungs daily.   cetirizine (ZYRTEC) 5 MG tablet, Take 5 mg by mouth 2 (two) times daily as needed for allergies.   fluticasone (FLONASE) 50 MCG/ACT nasal spray, Place 1 spray into the nose daily as needed for allergies. Reported on 08/06/2015   montelukast (SINGULAIR) 10 MG tablet, Take 10 mg by mouth daily.  Current Outpatient Medications (Analgesics):    oxyCODONE (OXY IR/ROXICODONE) 5 MG immediate release tablet, 5 mg every 4 (four) hours as needed.   Current Outpatient Medications (Other):    Calcium Citrate-Vitamin D (CALCIUM CITRATE +D PO), Take 2 tablets by mouth daily.    Diclofenac Sodium 2 % SOLN, Place 2 g onto the skin 2 (two) times daily.   estradiol (ESTRACE) 0.1 MG/GM vaginal cream, PLACE 1 APPLICATORFUL VAGINALLY 3 (THREE) TIMES A WEEK   MELATONIN-CHAMOMILE PO, Take 1 capsule by mouth at bedtime as needed and may repeat dose one time if needed (For sleep.).    methocarbamol (ROBAXIN) 500 MG tablet, Take 1 tablet (500 mg total) by mouth every 8 (eight) hours as needed for muscle spasms.   METRONIDAZOLE, TOPICAL, 0.75 % LOTN, Apply 1 application topically at bedtime.   minoxidil (ROGAINE) 2 % external solution, Apply 1 application topically 4 (four) times a week.    Multiple Vitamin (MULTIVITAMIN WITH MINERALS) TABS tablet, Take 1 tablet by mouth daily.   pyridoxine (B-6) 200 MG tablet, Take 200 mg by mouth daily.   venlafaxine XR  (EFFEXOR-XR) 37.5 MG 24 hr capsule, TAKE 1 CAPSULE BY MOUTH EVERY DAY WITH BREAKFAST (Patient taking differently: Take 37.5 mg by mouth. 3 times a week)   Vitamin D, Ergocalciferol, (DRISDOL) 1.25 MG (50000 UNIT) CAPS capsule, Take 1 capsule (50,000 Units total) by mouth every 7 (seven) days.   zolpidem (AMBIEN) 5 MG tablet, Take 1 tablet (5 mg total) by mouth at bedtime as needed for sleep.   Reviewed prior external information including notes and imaging from  primary care provider As well as notes that were available from care everywhere and other healthcare systems.  Past medical history, social, surgical and family history all reviewed in electronic medical record.  No pertanent information unless stated regarding to the chief complaint.   Review of Systems:  No headache, visual changes, nausea, vomiting, diarrhea, constipation, dizziness, abdominal pain, skin rash, fevers, chills, night sweats, weight loss, swollen lymph nodes, body aches, joint swelling, chest pain, shortness of breath, mood changes. POSITIVE muscle aches  Objective  Blood pressure 102/64, pulse 77, height 5\' 4"  (1.626 m), weight 138 lb (62.6 kg), SpO2 98%.   General: No apparent distress alert and oriented x3 mood and affect normal, dressed appropriately.  HEENT: Pupils equal, extraocular movements intact  Respiratory: Patient's speak in full sentences and does not appear short of breath  Cardiovascular: No lower extremity edema, non tender, no erythema  Tightness noted on the left side of the lower back.  Positive FABER test more than a positive straight leg test.  Foot exam does show positive squeeze test noted. Breakdown of the transverse arch of the foot also noted bilaterally.  Limited muscular skeletal ultrasound was performed and interpreted by Antoine Primas, M   Limited ultrasound of patient's foot shows that there is a new neuroma noted between the fourth and fifth metatarsal heads.  Pain with compression.   No cortical irregularity otherwise. Impression: New neuroma  of the foot   Impression and Recommendations:     The above documentation has been reviewed and is accurate and complete Judi Saa, DO

## 2023-06-16 ENCOUNTER — Other Ambulatory Visit: Payer: Self-pay

## 2023-06-16 ENCOUNTER — Ambulatory Visit (INDEPENDENT_AMBULATORY_CARE_PROVIDER_SITE_OTHER): Payer: PPO | Admitting: Family Medicine

## 2023-06-16 VITALS — BP 102/64 | HR 77 | Ht 64.0 in | Wt 138.0 lb

## 2023-06-16 DIAGNOSIS — G5781 Other specified mononeuropathies of right lower limb: Secondary | ICD-10-CM

## 2023-06-16 DIAGNOSIS — M216X2 Other acquired deformities of left foot: Secondary | ICD-10-CM | POA: Diagnosis not present

## 2023-06-16 DIAGNOSIS — M5416 Radiculopathy, lumbar region: Secondary | ICD-10-CM

## 2023-06-16 NOTE — Assessment & Plan Note (Addendum)
New neuroma noted between fourth and fifth metatarsal heads.  Concerning that would consider the possibility of an injection.  I am concerned that the patient is having more of the radicular symptoms in the L4-L5 area.  Will order an epidural at the back first.  Patient wants to hold on the steroid injections of the neuroma at the moment.  We did discuss the possibility of a PRP that could be helpful if necessary.  Follow-up with me again in 6 to 8 weeks otherwise.  Total time with patient today 31 minutes.  I do believe that secondary to the breakdown of the transverse arch custom orthotics could be beneficial.  Will also send him to formal physical therapy to help with training of the longitudinal and transverse arch to see if this will be beneficial as well as working on her back somewhat.

## 2023-06-16 NOTE — Patient Instructions (Addendum)
Custom Orthotics [facility] [phone]  Can consider injection with Toradol in foot PT referral See you again in 2-3 months

## 2023-06-16 NOTE — Assessment & Plan Note (Signed)
Sending for custom orthotics bilaterally.

## 2023-06-17 ENCOUNTER — Encounter: Payer: Self-pay | Admitting: Family Medicine

## 2023-06-19 DIAGNOSIS — J3081 Allergic rhinitis due to animal (cat) (dog) hair and dander: Secondary | ICD-10-CM | POA: Diagnosis not present

## 2023-06-19 DIAGNOSIS — M6281 Muscle weakness (generalized): Secondary | ICD-10-CM | POA: Diagnosis not present

## 2023-06-19 DIAGNOSIS — R150 Incomplete defecation: Secondary | ICD-10-CM | POA: Diagnosis not present

## 2023-06-19 DIAGNOSIS — M6289 Other specified disorders of muscle: Secondary | ICD-10-CM | POA: Diagnosis not present

## 2023-06-19 DIAGNOSIS — J3089 Other allergic rhinitis: Secondary | ICD-10-CM | POA: Diagnosis not present

## 2023-06-19 DIAGNOSIS — M62838 Other muscle spasm: Secondary | ICD-10-CM | POA: Diagnosis not present

## 2023-06-19 DIAGNOSIS — J301 Allergic rhinitis due to pollen: Secondary | ICD-10-CM | POA: Diagnosis not present

## 2023-06-19 DIAGNOSIS — K5900 Constipation, unspecified: Secondary | ICD-10-CM | POA: Diagnosis not present

## 2023-06-19 DIAGNOSIS — N3946 Mixed incontinence: Secondary | ICD-10-CM | POA: Diagnosis not present

## 2023-06-26 DIAGNOSIS — J3089 Other allergic rhinitis: Secondary | ICD-10-CM | POA: Diagnosis not present

## 2023-06-26 DIAGNOSIS — J301 Allergic rhinitis due to pollen: Secondary | ICD-10-CM | POA: Diagnosis not present

## 2023-06-29 DIAGNOSIS — M79671 Pain in right foot: Secondary | ICD-10-CM | POA: Diagnosis not present

## 2023-06-29 DIAGNOSIS — R269 Unspecified abnormalities of gait and mobility: Secondary | ICD-10-CM | POA: Diagnosis not present

## 2023-06-29 DIAGNOSIS — G5781 Other specified mononeuropathies of right lower limb: Secondary | ICD-10-CM | POA: Diagnosis not present

## 2023-07-06 DIAGNOSIS — J3089 Other allergic rhinitis: Secondary | ICD-10-CM | POA: Diagnosis not present

## 2023-07-06 DIAGNOSIS — J3081 Allergic rhinitis due to animal (cat) (dog) hair and dander: Secondary | ICD-10-CM | POA: Diagnosis not present

## 2023-07-06 DIAGNOSIS — J301 Allergic rhinitis due to pollen: Secondary | ICD-10-CM | POA: Diagnosis not present

## 2023-07-08 ENCOUNTER — Other Ambulatory Visit (INDEPENDENT_AMBULATORY_CARE_PROVIDER_SITE_OTHER): Payer: Medicare Other

## 2023-07-08 DIAGNOSIS — M5442 Lumbago with sciatica, left side: Secondary | ICD-10-CM

## 2023-07-08 DIAGNOSIS — R202 Paresthesia of skin: Secondary | ICD-10-CM

## 2023-07-08 DIAGNOSIS — M810 Age-related osteoporosis without current pathological fracture: Secondary | ICD-10-CM

## 2023-07-08 DIAGNOSIS — G8929 Other chronic pain: Secondary | ICD-10-CM

## 2023-07-08 LAB — LIPID PANEL
Cholesterol: 265 mg/dL — ABNORMAL HIGH (ref 0–200)
HDL: 83.7 mg/dL (ref >39.00)
LDL Cholesterol: 153 mg/dL — ABNORMAL HIGH (ref 0–99)
NonHDL: 181
Total CHOL/HDL Ratio: 3
Triglycerides: 140 mg/dL (ref 0.0–149.0)
VLDL: 28 mg/dL (ref 0.0–40.0)

## 2023-07-08 LAB — URINALYSIS
Bilirubin Urine: NEGATIVE
Hgb urine dipstick: NEGATIVE
Ketones, ur: NEGATIVE
Leukocytes,Ua: NEGATIVE
Nitrite: NEGATIVE
Specific Gravity, Urine: <=1.005 — AB (ref 1.000–1.030)
Total Protein, Urine: NEGATIVE
Urine Glucose: NEGATIVE
Urobilinogen, UA: 0.2 (ref 0.0–1.0)
pH: 7 (ref 5.0–8.0)

## 2023-07-08 LAB — CBC WITH DIFFERENTIAL/PLATELET
Basophils Absolute: 0 10*3/uL (ref 0.0–0.1)
Basophils Relative: 0.5 % (ref 0.0–3.0)
Eosinophils Absolute: 0.1 10*3/uL (ref 0.0–0.7)
Eosinophils Relative: 3.5 % (ref 0.0–5.0)
HCT: 42.4 % (ref 36.0–46.0)
Hemoglobin: 14.3 g/dL (ref 12.0–15.0)
Lymphocytes Relative: 34.3 % (ref 12.0–46.0)
Lymphs Abs: 1.4 10*3/uL (ref 0.7–4.0)
MCHC: 33.6 g/dL (ref 30.0–36.0)
MCV: 94.7 fL (ref 78.0–100.0)
Monocytes Absolute: 0.3 10*3/uL (ref 0.1–1.0)
Monocytes Relative: 7.7 % (ref 3.0–12.0)
Neutro Abs: 2.2 10*3/uL (ref 1.4–7.7)
Neutrophils Relative %: 54 % (ref 43.0–77.0)
Platelets: 258 10*3/uL (ref 150.0–400.0)
RBC: 4.47 Mil/uL (ref 3.87–5.11)
RDW: 13.7 % (ref 11.5–15.5)
WBC: 4.2 10*3/uL (ref 4.0–10.5)

## 2023-07-08 LAB — VITAMIN D 25 HYDROXY (VIT D DEFICIENCY, FRACTURES): VITD: 75.08 ng/mL (ref 30.00–100.00)

## 2023-07-08 LAB — VITAMIN B12: Vitamin B-12: 208 pg/mL — ABNORMAL LOW (ref 211–911)

## 2023-07-13 DIAGNOSIS — J301 Allergic rhinitis due to pollen: Secondary | ICD-10-CM | POA: Diagnosis not present

## 2023-07-13 DIAGNOSIS — J3081 Allergic rhinitis due to animal (cat) (dog) hair and dander: Secondary | ICD-10-CM | POA: Diagnosis not present

## 2023-07-13 DIAGNOSIS — J3089 Other allergic rhinitis: Secondary | ICD-10-CM | POA: Diagnosis not present

## 2023-07-14 ENCOUNTER — Encounter: Payer: Self-pay | Admitting: Internal Medicine

## 2023-07-14 DIAGNOSIS — M62838 Other muscle spasm: Secondary | ICD-10-CM | POA: Diagnosis not present

## 2023-07-14 DIAGNOSIS — R102 Pelvic and perineal pain: Secondary | ICD-10-CM | POA: Diagnosis not present

## 2023-07-14 DIAGNOSIS — M6281 Muscle weakness (generalized): Secondary | ICD-10-CM | POA: Diagnosis not present

## 2023-07-14 DIAGNOSIS — M6289 Other specified disorders of muscle: Secondary | ICD-10-CM | POA: Diagnosis not present

## 2023-07-15 DIAGNOSIS — R269 Unspecified abnormalities of gait and mobility: Secondary | ICD-10-CM | POA: Diagnosis not present

## 2023-07-15 DIAGNOSIS — M79671 Pain in right foot: Secondary | ICD-10-CM | POA: Diagnosis not present

## 2023-07-15 DIAGNOSIS — G5781 Other specified mononeuropathies of right lower limb: Secondary | ICD-10-CM | POA: Diagnosis not present

## 2023-07-20 DIAGNOSIS — J3089 Other allergic rhinitis: Secondary | ICD-10-CM | POA: Diagnosis not present

## 2023-07-20 DIAGNOSIS — J301 Allergic rhinitis due to pollen: Secondary | ICD-10-CM | POA: Diagnosis not present

## 2023-07-20 DIAGNOSIS — J3081 Allergic rhinitis due to animal (cat) (dog) hair and dander: Secondary | ICD-10-CM | POA: Diagnosis not present

## 2023-07-27 DIAGNOSIS — M6289 Other specified disorders of muscle: Secondary | ICD-10-CM | POA: Diagnosis not present

## 2023-07-27 DIAGNOSIS — M6281 Muscle weakness (generalized): Secondary | ICD-10-CM | POA: Diagnosis not present

## 2023-07-27 DIAGNOSIS — J3089 Other allergic rhinitis: Secondary | ICD-10-CM | POA: Diagnosis not present

## 2023-07-27 DIAGNOSIS — M62838 Other muscle spasm: Secondary | ICD-10-CM | POA: Diagnosis not present

## 2023-07-27 DIAGNOSIS — J3081 Allergic rhinitis due to animal (cat) (dog) hair and dander: Secondary | ICD-10-CM | POA: Diagnosis not present

## 2023-07-27 DIAGNOSIS — J301 Allergic rhinitis due to pollen: Secondary | ICD-10-CM | POA: Diagnosis not present

## 2023-07-27 DIAGNOSIS — R102 Pelvic and perineal pain: Secondary | ICD-10-CM | POA: Diagnosis not present

## 2023-07-29 ENCOUNTER — Ambulatory Visit (INDEPENDENT_AMBULATORY_CARE_PROVIDER_SITE_OTHER): Payer: Medicare Other | Admitting: Internal Medicine

## 2023-07-29 VITALS — BP 108/70 | HR 68 | Temp 98.9°F | Ht 64.0 in | Wt 137.0 lb

## 2023-07-29 DIAGNOSIS — J4521 Mild intermittent asthma with (acute) exacerbation: Secondary | ICD-10-CM

## 2023-07-29 DIAGNOSIS — E538 Deficiency of other specified B group vitamins: Secondary | ICD-10-CM | POA: Diagnosis not present

## 2023-07-29 DIAGNOSIS — E039 Hypothyroidism, unspecified: Secondary | ICD-10-CM | POA: Diagnosis not present

## 2023-07-29 DIAGNOSIS — F411 Generalized anxiety disorder: Secondary | ICD-10-CM

## 2023-07-29 MED ORDER — "BD INTEGRA SYRINGE 25G X 5/8"" 3 ML MISC": 0 refills | Status: AC

## 2023-07-29 MED ORDER — CYANOCOBALAMIN 1000 MCG/ML IJ SOLN
1000.0000 ug | Freq: Once | INTRAMUSCULAR | Status: AC
  Administered 2023-07-29: 1000 ug via INTRAMUSCULAR

## 2023-07-29 MED ORDER — CYANOCOBALAMIN 1000 MCG/ML IJ SOLN: INTRAMUSCULAR | 6 refills | Status: DC

## 2023-07-29 NOTE — Assessment & Plan Note (Signed)
New - Jan 2025 Start B12 inj

## 2023-07-29 NOTE — Progress Notes (Signed)
Subjective:  Patient ID: [name], female    DOB: [birth year]  Age: 68 y.o. MRN: [id number]  CC: Medical Management of Chronic Issues (Discuss recent labs and long term COVID)   HPI DELISA FINCK presents for B12 def, long - COVID (pt had COVID x5 times), IBS  Outpatient Medications Prior to Visit  Medication Sig Dispense Refill   ARNUITY ELLIPTA 100 MCG/ACT AEPB Inhale 1 puff into the lungs daily.     Calcium Citrate-Vitamin D (CALCIUM CITRATE +D PO) Take 2 tablets by mouth daily.      cetirizine (ZYRTEC) 5 MG tablet Take 5 mg by mouth 2 (two) times daily as needed for allergies.     Diclofenac Sodium 2 % SOLN Place 2 g onto the skin 2 (two) times daily. 112 g 3   EPIPEN 2-PAK 0.3 MG/0.3ML DEVI Inject 0.3 mg into the muscle once. Reported on 09/03/2015     estradiol (ESTRACE) 0.1 MG/GM vaginal cream PLACE 1 APPLICATORFUL VAGINALLY 3 (THREE) TIMES A WEEK 42.5 g 12   fluticasone (FLONASE) 50 MCG/ACT nasal spray Place 1 spray into the nose daily as needed for allergies. Reported on 08/06/2015     levothyroxine (SYNTHROID, LEVOTHROID) 25 MCG tablet Take 25-50 mcg by mouth daily. She takes one tablet daily 4 days per week (Monday-Thursday) and two tablets daily 3 days per week (Friday-Sunday).     liothyronine (CYTOMEL) 5 MCG tablet Take 5-10 mcg by mouth 2 (two) times daily. She takes one tablet in the morning and two tablets midday.     MELATONIN-CHAMOMILE PO Take 1 capsule by mouth at bedtime as needed and may repeat dose one time if needed (For sleep.).      METRONIDAZOLE, TOPICAL, 0.75 % LOTN Apply 1 application topically at bedtime.     minoxidil (ROGAINE) 2 % external solution Apply 1 application topically 4 (four) times a week.      montelukast (SINGULAIR) 10 MG tablet Take 10 mg by mouth daily.     Multiple Vitamin (MULTIVITAMIN WITH MINERALS) TABS tablet Take 1 tablet by mouth daily.     venlafaxine XR (EFFEXOR-XR) 37.5 MG 24 hr capsule TAKE 1 CAPSULE BY MOUTH EVERY DAY WITH  BREAKFAST (Patient taking differently: Take 37.5 mg by mouth. 3 times a week) 90 capsule 6   zoledronic acid (RECLAST) 5 MG/100ML SOLN injection Inject 5 mg into the vein. Once a year     methocarbamol (ROBAXIN) 500 MG tablet Take 1 tablet (500 mg total) by mouth every 8 (eight) hours as needed for muscle spasms. 90 tablet 2   oxyCODONE (OXY IR/ROXICODONE) 5 MG immediate release tablet 5 mg every 4 (four) hours as needed.     pyridoxine (B-6) 200 MG tablet Take 200 mg by mouth daily.     Vitamin D, Ergocalciferol, (DRISDOL) 1.25 MG (50000 UNIT) CAPS capsule Take 1 capsule (50,000 Units total) by mouth every 7 (seven) days. 12 capsule 0   zolpidem (AMBIEN) 5 MG tablet Take 1 tablet (5 mg total) by mouth at bedtime as needed for sleep. 30 tablet 1   No facility-administered medications prior to visit.    ROS: Review of Systems  Constitutional:  Positive for fatigue. Negative for activity change, appetite change, chills and unexpected weight change.  HENT:  Positive for congestion. Negative for mouth sores and sinus pressure.   Eyes:  Negative for visual disturbance.  Respiratory:  Negative for cough and chest tightness.   Gastrointestinal:  Positive for abdominal distention and constipation.  Negative for abdominal pain and nausea.  Genitourinary:  Negative for difficulty urinating, frequency and vaginal pain.  Musculoskeletal:  Negative for back pain and gait problem.  Skin:  Negative for pallor and rash.  Neurological:  Negative for dizziness, tremors, weakness, numbness and headaches.  Psychiatric/Behavioral:  Negative for confusion, sleep disturbance and suicidal ideas. The patient is nervous/anxious.     Objective:  BP 108/70 (BP Location: Left Arm, Patient Position: Sitting, Cuff Size: Normal)   Pulse 68   Temp 98.9 F (37.2 C) (Oral)   Ht 5\' 4"  (1.626 m)   Wt 137 lb (62.1 kg)   SpO2 98%   BMI 23.52 kg/m   BP Readings from Last 3 Encounters:  07/29/23 108/70  06/16/23 102/64   04/14/23 118/82    Wt Readings from Last 3 Encounters:  07/29/23 137 lb (62.1 kg)  06/16/23 138 lb (62.6 kg)  04/14/23 140 lb (63.5 kg)    Physical Exam Constitutional:      General: She is not in acute distress.    Appearance: She is well-developed.  HENT:     Head: Normocephalic.     Right Ear: External ear normal.     Left Ear: External ear normal.     Nose: Nose normal.  Eyes:     General:        Right eye: No discharge.        Left eye: No discharge.     Conjunctiva/sclera: Conjunctivae normal.     Pupils: Pupils are equal, round, and reactive to light.  Neck:     Thyroid: No thyromegaly.     Vascular: No JVD.     Trachea: No tracheal deviation.  Cardiovascular:     Rate and Rhythm: Normal rate and regular rhythm.     Heart sounds: Normal heart sounds.  Pulmonary:     Effort: No respiratory distress.     Breath sounds: No stridor. No wheezing.  Abdominal:     General: Bowel sounds are normal. There is no distension.     Palpations: Abdomen is soft. There is no mass.     Tenderness: There is no abdominal tenderness. There is no guarding or rebound.  Musculoskeletal:        General: No tenderness.     Cervical back: Normal range of motion and neck supple. No rigidity.  Lymphadenopathy:     Cervical: No cervical adenopathy.  Skin:    Findings: No erythema or rash.  Neurological:     Cranial Nerves: No cranial nerve deficit.     Motor: No abnormal muscle tone.     Coordination: Coordination normal.     Deep Tendon Reflexes: Reflexes normal.  Psychiatric:        Behavior: Behavior normal.        Thought Content: Thought content normal.        Judgment: Judgment normal.     Lab Results  Component Value Date   WBC 4.2 07/08/2023   HGB 14.3 07/08/2023   HCT 42.4 07/08/2023   PLT 258.0 07/08/2023   GLUCOSE 93 09/18/2021   CHOL 265 (H) 07/08/2023   TRIG 140.0 07/08/2023   HDL 83.70 07/08/2023   LDLDIRECT 102.8 11/11/2010   LDLCALC 153 (H) 07/08/2023    ALT 18 09/18/2021   AST 18 09/18/2021   NA 134 (L) 09/18/2021   K 3.5 09/18/2021   CL 99 09/18/2021   CREATININE 0.75 09/18/2021   BUN 19 09/18/2021   CO2 28 09/18/2021  TSH 0.74 09/18/2021    DG Facet Jt Neuro Destruct Sing L/S w/Img Guide Result Date: 09/29/2022 CLINICAL DATA:  Facet mediated left-sided low back and buttock pain. Significant improvement after left L4-L5 and L5-S1 facet medial branch blocks three weeks ago. Patient presents for RFA. EXAM: FLUOROSCOPICALLY GUIDED FACET RADIOFREQUENCY ABLATION OF THE LEFT L4-L5 AND L5-S1 FACET JOINTS FLUOROSCOPY TIME:  Radiation Exposure Index (as provided by the fluoroscopic device): 17.7 mGy Kerma SEDATION: Moderate (conscious) sedation was employed during this procedure. A total of Versed 4 mg and Fentanyl 125 mcg were administered intravenously. 30 mg Toradol IV were administered at the conclusion of the procedure. Moderate Sedation Time: 40 minutes. The patient's level of consciousness and vital signs were monitored continuously by radiology nursing throughout the procedure under my direct supervision. TECHNIQUE: The procedure, risks, benefits, and alternatives were explained to the patient. Questions regarding the procedure were encouraged and answered. The patient understands and consents to the procedure. A grounding pad was placed on the right leg and confirmed visually. An appropriate skin entry site was determined fluoroscopically and marked. Site prepped with betadine, draped in usual sterile fashion, and infiltrated locally with 1% lidocaine. LEFT L3 MEDIAL BRANCH RFA: A posterior oblique approach was taken to the junction of the superior articular process and transverse process on the left at L4 using a 20 gauge 10 cm Stryker needle, to lie along the course of the left L3 medial branch nerve. The 10 mm active tip probe was placed through the needle, with subsequent detection of low impedance. Sensory and motor stimulation elicited concordant  symptoms at low voltage, without lower extremity sensory or motor symptoms. Using a tandem needle, 1 mL of 1% lidocaine was placed around the probe tip to mitigate the acute symptoms of the ablation. LEFT L4 MEDIAL BRANCH RFA: A posterior oblique approach was taken to the junction of the superior articular process and transverse process on the left at L5 using a 20 gauge 10 cm Stryker needle, to lie along the course of the left L4 medial branch nerve. The 10 mm active tip probe was placed through the needle, with subsequent detection of low impedance. Sensory and motor stimulation elicited concordant symptoms at low voltage, without lower extremity sensory or motor symptoms. Using a tandem needle, 1 mL of 1% lidocaine was placed around the probe tip to mitigate the acute symptoms of the ablation. LEFT L5 DORSAL RAMUS RFA: A posterior oblique approach was taken to the junction of the S1 superior articular process and sacral ala on the left using a 20 gauge 10 cm Stryker needle, to lie along the course of the left L5 dorsal ramus. The 10 mm active tip probe was placed through the needle, with subsequent detection of low impedance. Sensory and motor stimulation elicited concordant symptoms at low voltage, without lower extremity sensory or motor symptoms. Using a tandem needle, 1 mL of 1% lidocaine was placed around the probe tip to mitigate the acute symptoms of the ablation. Finally, the lesions were created uneventfully for 90 seconds at 80 degrees Celsius. Afterwards 40 mg Depo-Medrol mixed with 0.5 mL of 1% lidocaine were administered at the ablation sites. The needles were subsequently removed. The procedure was well-tolerated. After the procedure, the patient was able to ambulate without difficulty, leg weakness, or numbness. IMPRESSION: 1. Technically successful left L4-L5 and L5-S1 facet radiofrequency ablations. Electronically Signed   By: Obie Dredge M.D.   On: 09/29/2022 11:52   DG Facet Jt Neuro  Destruct Ea  Add L/S w/Img Guide Result Date: 09/29/2022 CLINICAL DATA:  Facet mediated left-sided low back and buttock pain. Significant improvement after left L4-L5 and L5-S1 facet medial branch blocks three weeks ago. Patient presents for RFA. EXAM: FLUOROSCOPICALLY GUIDED FACET RADIOFREQUENCY ABLATION OF THE LEFT L4-L5 AND L5-S1 FACET JOINTS FLUOROSCOPY TIME:  Radiation Exposure Index (as provided by the fluoroscopic device): 17.7 mGy Kerma SEDATION: Moderate (conscious) sedation was employed during this procedure. A total of Versed 4 mg and Fentanyl 125 mcg were administered intravenously. 30 mg Toradol IV were administered at the conclusion of the procedure. Moderate Sedation Time: 40 minutes. The patient's level of consciousness and vital signs were monitored continuously by radiology nursing throughout the procedure under my direct supervision. TECHNIQUE: The procedure, risks, benefits, and alternatives were explained to the patient. Questions regarding the procedure were encouraged and answered. The patient understands and consents to the procedure. A grounding pad was placed on the right leg and confirmed visually. An appropriate skin entry site was determined fluoroscopically and marked. Site prepped with betadine, draped in usual sterile fashion, and infiltrated locally with 1% lidocaine. LEFT L3 MEDIAL BRANCH RFA: A posterior oblique approach was taken to the junction of the superior articular process and transverse process on the left at L4 using a 20 gauge 10 cm Stryker needle, to lie along the course of the left L3 medial branch nerve. The 10 mm active tip probe was placed through the needle, with subsequent detection of low impedance. Sensory and motor stimulation elicited concordant symptoms at low voltage, without lower extremity sensory or motor symptoms. Using a tandem needle, 1 mL of 1% lidocaine was placed around the probe tip to mitigate the acute symptoms of the ablation. LEFT L4 MEDIAL  BRANCH RFA: A posterior oblique approach was taken to the junction of the superior articular process and transverse process on the left at L5 using a 20 gauge 10 cm Stryker needle, to lie along the course of the left L4 medial branch nerve. The 10 mm active tip probe was placed through the needle, with subsequent detection of low impedance. Sensory and motor stimulation elicited concordant symptoms at low voltage, without lower extremity sensory or motor symptoms. Using a tandem needle, 1 mL of 1% lidocaine was placed around the probe tip to mitigate the acute symptoms of the ablation. LEFT L5 DORSAL RAMUS RFA: A posterior oblique approach was taken to the junction of the S1 superior articular process and sacral ala on the left using a 20 gauge 10 cm Stryker needle, to lie along the course of the left L5 dorsal ramus. The 10 mm active tip probe was placed through the needle, with subsequent detection of low impedance. Sensory and motor stimulation elicited concordant symptoms at low voltage, without lower extremity sensory or motor symptoms. Using a tandem needle, 1 mL of 1% lidocaine was placed around the probe tip to mitigate the acute symptoms of the ablation. Finally, the lesions were created uneventfully for 90 seconds at 80 degrees Celsius. Afterwards 40 mg Depo-Medrol mixed with 0.5 mL of 1% lidocaine were administered at the ablation sites. The needles were subsequently removed. The procedure was well-tolerated. After the procedure, the patient was able to ambulate without difficulty, leg weakness, or numbness. IMPRESSION: 1. Technically successful left L4-L5 and L5-S1 facet radiofrequency ablations. Electronically Signed   By: Obie Dredge M.D.   On: 09/29/2022 11:52   DG Facet Jt Neuro Destruct Ea Add L/S w/Img Guide Result Date: 09/29/2022 CLINICAL DATA:  Facet mediated  left-sided low back and buttock pain. Significant improvement after left L4-L5 and L5-S1 facet medial branch blocks three weeks ago.  Patient presents for RFA. EXAM: FLUOROSCOPICALLY GUIDED FACET RADIOFREQUENCY ABLATION OF THE LEFT L4-L5 AND L5-S1 FACET JOINTS FLUOROSCOPY TIME:  Radiation Exposure Index (as provided by the fluoroscopic device): 17.7 mGy Kerma SEDATION: Moderate (conscious) sedation was employed during this procedure. A total of Versed 4 mg and Fentanyl 125 mcg were administered intravenously. 30 mg Toradol IV were administered at the conclusion of the procedure. Moderate Sedation Time: 40 minutes. The patient's level of consciousness and vital signs were monitored continuously by radiology nursing throughout the procedure under my direct supervision. TECHNIQUE: The procedure, risks, benefits, and alternatives were explained to the patient. Questions regarding the procedure were encouraged and answered. The patient understands and consents to the procedure. A grounding pad was placed on the right leg and confirmed visually. An appropriate skin entry site was determined fluoroscopically and marked. Site prepped with betadine, draped in usual sterile fashion, and infiltrated locally with 1% lidocaine. LEFT L3 MEDIAL BRANCH RFA: A posterior oblique approach was taken to the junction of the superior articular process and transverse process on the left at L4 using a 20 gauge 10 cm Stryker needle, to lie along the course of the left L3 medial branch nerve. The 10 mm active tip probe was placed through the needle, with subsequent detection of low impedance. Sensory and motor stimulation elicited concordant symptoms at low voltage, without lower extremity sensory or motor symptoms. Using a tandem needle, 1 mL of 1% lidocaine was placed around the probe tip to mitigate the acute symptoms of the ablation. LEFT L4 MEDIAL BRANCH RFA: A posterior oblique approach was taken to the junction of the superior articular process and transverse process on the left at L5 using a 20 gauge 10 cm Stryker needle, to lie along the course of the left L4  medial branch nerve. The 10 mm active tip probe was placed through the needle, with subsequent detection of low impedance. Sensory and motor stimulation elicited concordant symptoms at low voltage, without lower extremity sensory or motor symptoms. Using a tandem needle, 1 mL of 1% lidocaine was placed around the probe tip to mitigate the acute symptoms of the ablation. LEFT L5 DORSAL RAMUS RFA: A posterior oblique approach was taken to the junction of the S1 superior articular process and sacral ala on the left using a 20 gauge 10 cm Stryker needle, to lie along the course of the left L5 dorsal ramus. The 10 mm active tip probe was placed through the needle, with subsequent detection of low impedance. Sensory and motor stimulation elicited concordant symptoms at low voltage, without lower extremity sensory or motor symptoms. Using a tandem needle, 1 mL of 1% lidocaine was placed around the probe tip to mitigate the acute symptoms of the ablation. Finally, the lesions were created uneventfully for 90 seconds at 80 degrees Celsius. Afterwards 40 mg Depo-Medrol mixed with 0.5 mL of 1% lidocaine were administered at the ablation sites. The needles were subsequently removed. The procedure was well-tolerated. After the procedure, the patient was able to ambulate without difficulty, leg weakness, or numbness. IMPRESSION: 1. Technically successful left L4-L5 and L5-S1 facet radiofrequency ablations. Electronically Signed   By: Obie Dredge M.D.   On: 09/29/2022 11:52    Assessment & Plan:   Problem List Items Addressed This Visit     Hypothyroidism   Cont Rx      Anxiety state  Stress w/dtr      Asthma   Breo  Medrol pack      B12 deficiency - Primary   New - Jan 2025 Start B12 inj      Relevant Orders   Comprehensive metabolic panel   Vitamin B12      Meds ordered this encounter  Medications   cyanocobalamin (VITAMIN B12) 1000 MCG/ML injection    Sig: Give 1000 mcg of Vitamin b12 sq  daily x 1 week, then weekly for 1 month, then once every 2 weeks    Dispense:  10 mL    Refill:  6   SYRINGE-NEEDLE, DISP, 3 ML (B-D INTE[employer]RA SYRINGE) 25G X 5/8" 3 ML MISC    Sig: As directed sq    Dispense:  50 each    Refill:  0      Follow-up: Return in about 3 months (around 10/27/2023) for a follow-up visit.  Sonda Primes, MD

## 2023-07-29 NOTE — Addendum Note (Signed)
Addended by: Delsa Grana R on: 07/29/2023 03:35 PM   Modules accepted: Orders

## 2023-07-29 NOTE — Assessment & Plan Note (Signed)
Stress w/dtr

## 2023-07-29 NOTE — Assessment & Plan Note (Addendum)
Breo  Medrol pack

## 2023-07-29 NOTE — Assessment & Plan Note (Signed)
Cont Rx

## 2023-07-29 NOTE — Patient Instructions (Addendum)
For a mild COVID-19 case - take zinc 50 mg a day for 1 week, vitamin C 1000 mg daily for 1 week, vitamin D2 50,000 units weekly for 2 months (unless  taking vitamin D daily already), an antioxidant Quercetin 500 mg twice a day for 1 week (if you can get it quick enough). Take Allegra or Benadryl.  Maintain good oral hydration and take Tylenol for high fever.

## 2023-08-01 DIAGNOSIS — G5781 Other specified mononeuropathies of right lower limb: Secondary | ICD-10-CM | POA: Diagnosis not present

## 2023-08-01 DIAGNOSIS — R269 Unspecified abnormalities of gait and mobility: Secondary | ICD-10-CM | POA: Diagnosis not present

## 2023-08-01 DIAGNOSIS — M79671 Pain in right foot: Secondary | ICD-10-CM | POA: Diagnosis not present

## 2023-08-03 DIAGNOSIS — J301 Allergic rhinitis due to pollen: Secondary | ICD-10-CM | POA: Diagnosis not present

## 2023-08-03 DIAGNOSIS — J3089 Other allergic rhinitis: Secondary | ICD-10-CM | POA: Diagnosis not present

## 2023-08-03 DIAGNOSIS — J3081 Allergic rhinitis due to animal (cat) (dog) hair and dander: Secondary | ICD-10-CM | POA: Diagnosis not present

## 2023-08-07 DIAGNOSIS — M6281 Muscle weakness (generalized): Secondary | ICD-10-CM | POA: Diagnosis not present

## 2023-08-07 DIAGNOSIS — M6289 Other specified disorders of muscle: Secondary | ICD-10-CM | POA: Diagnosis not present

## 2023-08-07 DIAGNOSIS — H00024 Hordeolum internum left upper eyelid: Secondary | ICD-10-CM | POA: Diagnosis not present

## 2023-08-07 DIAGNOSIS — H5213 Myopia, bilateral: Secondary | ICD-10-CM | POA: Diagnosis not present

## 2023-08-07 DIAGNOSIS — K5900 Constipation, unspecified: Secondary | ICD-10-CM | POA: Diagnosis not present

## 2023-08-07 DIAGNOSIS — R150 Incomplete defecation: Secondary | ICD-10-CM | POA: Diagnosis not present

## 2023-08-10 DIAGNOSIS — R269 Unspecified abnormalities of gait and mobility: Secondary | ICD-10-CM | POA: Diagnosis not present

## 2023-08-10 DIAGNOSIS — J301 Allergic rhinitis due to pollen: Secondary | ICD-10-CM | POA: Diagnosis not present

## 2023-08-10 DIAGNOSIS — G5781 Other specified mononeuropathies of right lower limb: Secondary | ICD-10-CM | POA: Diagnosis not present

## 2023-08-10 DIAGNOSIS — J3081 Allergic rhinitis due to animal (cat) (dog) hair and dander: Secondary | ICD-10-CM | POA: Diagnosis not present

## 2023-08-10 DIAGNOSIS — M79671 Pain in right foot: Secondary | ICD-10-CM | POA: Diagnosis not present

## 2023-08-10 DIAGNOSIS — J3089 Other allergic rhinitis: Secondary | ICD-10-CM | POA: Diagnosis not present

## 2023-08-16 ENCOUNTER — Encounter: Payer: Self-pay | Admitting: Family Medicine

## 2023-08-17 DIAGNOSIS — J301 Allergic rhinitis due to pollen: Secondary | ICD-10-CM | POA: Diagnosis not present

## 2023-08-17 DIAGNOSIS — J3081 Allergic rhinitis due to animal (cat) (dog) hair and dander: Secondary | ICD-10-CM | POA: Diagnosis not present

## 2023-08-17 DIAGNOSIS — R150 Incomplete defecation: Secondary | ICD-10-CM | POA: Diagnosis not present

## 2023-08-17 DIAGNOSIS — M62838 Other muscle spasm: Secondary | ICD-10-CM | POA: Diagnosis not present

## 2023-08-17 DIAGNOSIS — M6281 Muscle weakness (generalized): Secondary | ICD-10-CM | POA: Diagnosis not present

## 2023-08-17 DIAGNOSIS — J3089 Other allergic rhinitis: Secondary | ICD-10-CM | POA: Diagnosis not present

## 2023-08-17 DIAGNOSIS — K5900 Constipation, unspecified: Secondary | ICD-10-CM | POA: Diagnosis not present

## 2023-08-18 ENCOUNTER — Ambulatory Visit: Payer: Medicare Other | Admitting: Family Medicine

## 2023-08-18 ENCOUNTER — Ambulatory Visit (INDEPENDENT_AMBULATORY_CARE_PROVIDER_SITE_OTHER): Payer: Medicare Other

## 2023-08-18 ENCOUNTER — Other Ambulatory Visit: Payer: Self-pay

## 2023-08-18 VITALS — BP 122/82 | HR 63 | Ht 64.0 in | Wt 137.0 lb

## 2023-08-18 DIAGNOSIS — E538 Deficiency of other specified B group vitamins: Secondary | ICD-10-CM

## 2023-08-18 DIAGNOSIS — M79672 Pain in left foot: Secondary | ICD-10-CM

## 2023-08-18 DIAGNOSIS — M810 Age-related osteoporosis without current pathological fracture: Secondary | ICD-10-CM | POA: Diagnosis not present

## 2023-08-18 DIAGNOSIS — M5416 Radiculopathy, lumbar region: Secondary | ICD-10-CM | POA: Diagnosis not present

## 2023-08-18 DIAGNOSIS — M79671 Pain in right foot: Secondary | ICD-10-CM

## 2023-08-18 DIAGNOSIS — M5442 Lumbago with sciatica, left side: Secondary | ICD-10-CM

## 2023-08-18 DIAGNOSIS — R202 Paresthesia of skin: Secondary | ICD-10-CM | POA: Diagnosis not present

## 2023-08-18 DIAGNOSIS — M5116 Intervertebral disc disorders with radiculopathy, lumbar region: Secondary | ICD-10-CM | POA: Diagnosis not present

## 2023-08-18 DIAGNOSIS — G8929 Other chronic pain: Secondary | ICD-10-CM

## 2023-08-18 DIAGNOSIS — R200 Anesthesia of skin: Secondary | ICD-10-CM | POA: Diagnosis not present

## 2023-08-18 NOTE — Progress Notes (Signed)
[name] [address] [place] [address] Phone: [phone] Subjective:   Bruce Donath, am serving as a scribe for Dr. Antoine Primas.  I'm seeing this patient by the request  of:  Plotnikov, Georgina Quint, MD  CC: Back pain, buttocks pain, leg pain.  [id number]  TIFFNY GEMMER is a 68 y.o. female coming in with complaint of multiple complaints did send a message where patient continues to have some radicular symptoms noted, found to have significantly low B12 and is doing the injections in the weekly phases.  Continues to have tingling in the right foot and some in the left foot.  Been working on the exercises.  Pain plantar surface of R foot over met heads. Worse with weight bearing. Pain in L foot and L knee is intermittent.   Physical therapy has not been helpful. Would like Mri to take to PT today.    Nerve conduction studies does show bilateral L5 nerve root decrease radiculopathy.  This was from February 2024  Patient is due for a DEXA scan in March of this year.  Labs showed elevated cholesterol but calcium CT scoring in 2021 had a score of 0.  Past Medical History:  Diagnosis Date   ALLERGIC RHINITIS 03/02/2007   Anemia    ANXIETY 03/02/2007   Arthritis    ASTHMA 11/12/2007   Dr [name]   Cancer [facility])    skin, hx of   Dysrhythmia    pt. states at night will notice a different heart rhythem   Endometrial cancer (HCC)    FIBROCYSTIC BREAST DISEASE, HX OF 03/02/2007   GERD 03/02/2007   HYPOTHYROIDISM 11/12/2007   Dr Talmage Nap   Neuromuscular disorder (HCC)    carpel tunnel syndrome, compression of ulner nerve at elbows   OSTEOPENIA 11/12/2007   PEPTIC ULCER DISEASE 03/02/2007   Pneumonia    hx. of   Radiation 09/27/15-10/23/15   HDR to vaginal cuff 30 Gy   Stress fracture    TMJ SYNDROME 11/12/2007   Past Surgical History:  Procedure Laterality Date   2008 TMJ surgery      CARPAL TUNNEL WITH CUBITAL TUNNEL Left 07/2019   carpel  tunnel Right 11/20/2016   colonoscopy x 2     cubital tunnel release Right 11/20/2016   deviated septum surgery - 2007     DILATION AND CURETTAGE OF UTERUS     2017   endoscopy - 1990     Mandib advancement forward  2009   right knee meniscus Right 07/16/2022   ROBOTIC ASSISTED TOTAL HYSTERECTOMY WITH BILATERAL SALPINGO OOPHERECTOMY Bilateral 08/14/2015   Procedure: XI ROBOTIC ASSISTED TOTAL HYSTERECTOMY WITH BILATERAL SALPINGO OOPHORECTOMY AND SENTAL LYMPH NODE BIOPSY;  Surgeon: Adolphus Birchwood, MD;  Location: [facility];  Service: Gynecology;  Laterality: Bilateral;   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     Social History   Socioeconomic History   Marital status: Married    Spouse name: Not on file   Number of children: Not on file   Years of education: Not on file   Highest education level: Bachelor's degree (e.g., BA, AB, BS)  Occupational History   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[employer]  Tobacco Use   Smoking status: Never   Smokeless tobacco: Never   Tobacco comments:    Married, 1 child  Vaping Use   Vaping status: Never Used  Substance and Sexual Activity   Alcohol use: Yes    Alcohol/week: 2.0  standard drinks of alcohol    Types: 1 Glasses of wine, 1 Cans of beer per week    Comment: daily   Drug use: No   Sexual activity: Yes  Other Topics Concern   Not on file  Social History Narrative   Not on file   Social Drivers of Health   Financial Resource Strain: Low Risk  (07/29/2023)   Overall Financial Resource Strain (CARDIA)    Difficulty of Paying Living Expenses: Not hard at all  Food Insecurity: No Food Insecurity (07/29/2023)   Hunger Vital Sign    Worried About Running Out of Food in the Last Year: Never true    Ran Out of Food in the Last Year: Never true  Transportation Needs: No Transportation Needs (07/29/2023)   PRAPARE - Administrator, Civil Service (Medical): No    Lack of Transportation (Non-Medical): No  Physical  Activity: Sufficiently Active (07/29/2023)   Exercise Vital Sign    Days of Exercise per Week: 6 days    Minutes of Exercise per Session: 40 min  Stress: No Stress Concern Present (07/29/2023)   [employer] of Occupational Health - Occupational Stress Questionnaire    Feeling of Stress : Not at all  Social Connections: Socially Integrated (07/29/2023)   Social Connection and Isolation Panel [NHANES]    Frequency of Communication with Friends and Family: Twice a week    Frequency of Social Gatherings with Friends and Family: Twice a week    Attends Religious Services: More than 4 times per year    Active Member of [employer] or Organizations: Yes    Attends Engineer, structural: More than 4 times per year    Marital Status: Married   Allergies  Allergen Reactions   Ibuprofen Other (See Comments)    Gastritis    Shellfish Allergy Other (See Comments)    Abdominal pain and Diarrhea   Colchicine     diarrhea   Astroglide [Gyne-Moistrin] Other (See Comments)    Itching and burning   Demerol Nausea And Vomiting   Nsaids Nausea Only    Other Reaction(s): GI Intolerance   Penicillins Hives and Other (See Comments)    Face turned red and had a headache. Has patient had a PCN reaction causing immediate rash, facial/tongue/throat swelling, SOB or lightheadedness with hypotension: no Has patient had a PCN reaction causing severe rash involving mucus membranes or skin necrosis: no Has patient had a PCN reaction that required hospitalization: no Has patient had a PCN reaction occurring within the last 10 years: no If all of the above answers are "NO", then may proceed with Cephalosporin use.   Family History  Problem Relation Age of Onset   Hypertension Father    Heart disease Father 26       chf   Heart disease Mother 15       CAD   Hypertension Other    Asthma Neg Hx     Current Outpatient Medications (Endocrine & Metabolic):    levothyroxine (SYNTHROID, LEVOTHROID) 25  MCG tablet, Take 25-50 mcg by mouth daily. She takes one tablet daily 4 days per week (Monday-Thursday) and two tablets daily 3 days per week (Friday-Sunday).   liothyronine (CYTOMEL) 5 MCG tablet, Take 5-10 mcg by mouth 2 (two) times daily. She takes one tablet in the morning and two tablets midday.   zoledronic acid (RECLAST) 5 MG/100ML SOLN injection, Inject 5 mg into the vein. Once a year  Current Outpatient Medications (Cardiovascular):  EPIPEN 2-PAK 0.3 MG/0.3ML DEVI, Inject 0.3 mg into the muscle once. Reported on 09/03/2015  Current Outpatient Medications (Respiratory):    ARNUITY ELLIPTA 100 MCG/ACT AEPB, Inhale 1 puff into the lungs daily.   cetirizine (ZYRTEC) 5 MG tablet, Take 5 mg by mouth 2 (two) times daily as needed for allergies.   fluticasone (FLONASE) 50 MCG/ACT nasal spray, Place 1 spray into the nose daily as needed for allergies. Reported on 08/06/2015   montelukast (SINGULAIR) 10 MG tablet, Take 10 mg by mouth daily.   Current Outpatient Medications (Hematological):    cyanocobalamin (VITAMIN B12) 1000 MCG/ML injection, Give 1000 mcg of Vitamin b12 sq daily x 1 week, then weekly for 1 month, then once every 2 weeks  Current Outpatient Medications (Other):    Calcium Citrate-Vitamin D (CALCIUM CITRATE +D PO), Take 2 tablets by mouth daily.    Diclofenac Sodium 2 % SOLN, Place 2 g onto the skin 2 (two) times daily.   estradiol (ESTRACE) 0.1 MG/GM vaginal cream, PLACE 1 APPLICATORFUL VAGINALLY 3 (THREE) TIMES A WEEK   MELATONIN-CHAMOMILE PO, Take 1 capsule by mouth at bedtime as needed and may repeat dose one time if needed (For sleep.).    METRONIDAZOLE, TOPICAL, 0.75 % LOTN, Apply 1 application topically at bedtime.   minoxidil (ROGAINE) 2 % external solution, Apply 1 application topically 4 (four) times a week.    Multiple Vitamin (MULTIVITAMIN WITH MINERALS) TABS tablet, Take 1 tablet by mouth daily.   SYRINGE-NEEDLE, DISP, 3 ML (B-D INTEGRA SYRINGE) 25G X 5/8" 3 ML  MISC, As directed sq   venlafaxine XR (EFFEXOR-XR) 37.5 MG 24 hr capsule, TAKE 1 CAPSULE BY MOUTH EVERY DAY WITH BREAKFAST (Patient taking differently: Take 37.5 mg by mouth. 3 times a week)   Reviewed prior external information including notes and imaging from  primary care provider As well as notes that were available from care everywhere and other healthcare systems.  Past medical history, social, surgical and family history all reviewed in electronic medical record.  No pertanent information unless stated regarding to the chief complaint.   Review of Systems:  No headache, visual changes, nausea, vomiting, diarrhea, constipation, dizziness, abdominal pain, skin rash, fevers, chills, night sweats, weight loss, swollen lymph nodes, body aches, joint swelling, chest pain, shortness of breath, mood changes. POSITIVE muscle aches  Objective  Blood pressure 122/82, pulse 63, height 5\' 4"  (1.626 m), weight 137 lb (62.1 kg), SpO2 99%.   General: No apparent distress alert and oriented x3 mood and affect normal, dressed appropriately.  HEENT: Pupils equal, extraocular movements intact  Respiratory: Patient's speak in full sentences and does not appear short of breath  Cardiovascular: No lower extremity edema, non tender, no erythema  Low back exam does have loss lordosis noted.  Tightness noted with straight leg test left greater than right.  This does seem to make some of the discomfort in the foot.  Does have though the loss of the transverse arch also noted with bunion and bunionette formation.  Weakness with 4 out of 5 strength of dorsiflexion compared to the contralateral side    Impression and Recommendations:     The above documentation has been reviewed and is accurate and complete Judi Saa, DO

## 2023-08-18 NOTE — Patient Instructions (Signed)
Read about Cymbalta Xray lumbar today Epidural L4/L5 See me 6 weeks after injection

## 2023-08-18 NOTE — Assessment & Plan Note (Signed)
Having more of the radicular symptoms.  Does have the nerve conduction test showing an L5 nerve root distribution previously bilaterally.  Do feel an epidural could be beneficial again.  Does have the underlying osteoporosis to play a role.  In addition to this we did discuss the possibility of Cymbalta helping with some of the nerve pain.  Does want her to continue to work on the B12 deficiency and is on the weekly injections at the moment.  Discussed icing regimen and home exercises.  Discussed continuing to stay active.  Continue to wear good shoes.  Follow-up again in 6 to 8 weeks

## 2023-08-20 DIAGNOSIS — R269 Unspecified abnormalities of gait and mobility: Secondary | ICD-10-CM | POA: Diagnosis not present

## 2023-08-20 DIAGNOSIS — M79671 Pain in right foot: Secondary | ICD-10-CM | POA: Diagnosis not present

## 2023-08-20 DIAGNOSIS — G5781 Other specified mononeuropathies of right lower limb: Secondary | ICD-10-CM | POA: Diagnosis not present

## 2023-08-24 DIAGNOSIS — J3089 Other allergic rhinitis: Secondary | ICD-10-CM | POA: Diagnosis not present

## 2023-08-24 DIAGNOSIS — J301 Allergic rhinitis due to pollen: Secondary | ICD-10-CM | POA: Diagnosis not present

## 2023-08-24 DIAGNOSIS — J3081 Allergic rhinitis due to animal (cat) (dog) hair and dander: Secondary | ICD-10-CM | POA: Diagnosis not present

## 2023-08-31 DIAGNOSIS — J3081 Allergic rhinitis due to animal (cat) (dog) hair and dander: Secondary | ICD-10-CM | POA: Diagnosis not present

## 2023-08-31 DIAGNOSIS — J3089 Other allergic rhinitis: Secondary | ICD-10-CM | POA: Diagnosis not present

## 2023-08-31 DIAGNOSIS — J301 Allergic rhinitis due to pollen: Secondary | ICD-10-CM | POA: Diagnosis not present

## 2023-09-01 DIAGNOSIS — M79671 Pain in right foot: Secondary | ICD-10-CM | POA: Diagnosis not present

## 2023-09-01 DIAGNOSIS — G5781 Other specified mononeuropathies of right lower limb: Secondary | ICD-10-CM | POA: Diagnosis not present

## 2023-09-01 DIAGNOSIS — R269 Unspecified abnormalities of gait and mobility: Secondary | ICD-10-CM | POA: Diagnosis not present

## 2023-09-01 NOTE — Discharge Instructions (Signed)

## 2023-09-02 ENCOUNTER — Ambulatory Visit
Admission: RE | Admit: 2023-09-02 | Discharge: 2023-09-02 | Disposition: A | Discharge status: Home / Self Care | Payer: Medicare Other | Source: Ambulatory Visit | Attending: Family Medicine | Admitting: Family Medicine

## 2023-09-02 DIAGNOSIS — M4727 Other spondylosis with radiculopathy, lumbosacral region: Secondary | ICD-10-CM | POA: Diagnosis not present

## 2023-09-02 DIAGNOSIS — M5416 Radiculopathy, lumbar region: Secondary | ICD-10-CM

## 2023-09-02 MED ORDER — METHYLPREDNISOLONE ACETATE 40 MG/ML INJ SUSP (RADIOLOG
80.0000 mg | Freq: Once | INTRAMUSCULAR | Status: AC
  Administered 2023-09-02: 80 mg via EPIDURAL

## 2023-09-02 MED ORDER — IOPAMIDOL (ISOVUE-M 200) INJECTION 41%
1.0000 mL | Freq: Once | INTRAMUSCULAR | Status: AC
Start: 2023-09-02 — End: 2023-09-02
  Administered 2023-09-02: 1 mL via EPIDURAL

## 2023-09-03 DIAGNOSIS — M6289 Other specified disorders of muscle: Secondary | ICD-10-CM | POA: Diagnosis not present

## 2023-09-03 DIAGNOSIS — M62838 Other muscle spasm: Secondary | ICD-10-CM | POA: Diagnosis not present

## 2023-09-03 DIAGNOSIS — K5900 Constipation, unspecified: Secondary | ICD-10-CM | POA: Diagnosis not present

## 2023-09-03 DIAGNOSIS — R150 Incomplete defecation: Secondary | ICD-10-CM | POA: Diagnosis not present

## 2023-09-04 DIAGNOSIS — Z8262 Family history of osteoporosis: Secondary | ICD-10-CM | POA: Diagnosis not present

## 2023-09-04 DIAGNOSIS — Z7952 Long term (current) use of systemic steroids: Secondary | ICD-10-CM | POA: Diagnosis not present

## 2023-09-04 DIAGNOSIS — M8588 Other specified disorders of bone density and structure, other site: Secondary | ICD-10-CM | POA: Diagnosis not present

## 2023-09-22 DIAGNOSIS — E538 Deficiency of other specified B group vitamins: Secondary | ICD-10-CM | POA: Diagnosis not present

## 2023-10-08 DIAGNOSIS — Z1231 Encounter for screening mammogram for malignant neoplasm of breast: Secondary | ICD-10-CM | POA: Diagnosis not present

## 2023-10-19 DIAGNOSIS — E559 Vitamin D deficiency, unspecified: Secondary | ICD-10-CM | POA: Diagnosis not present

## 2023-10-19 DIAGNOSIS — M810 Age-related osteoporosis without current pathological fracture: Secondary | ICD-10-CM | POA: Diagnosis not present

## 2023-10-22 ENCOUNTER — Encounter: Payer: Self-pay | Admitting: Family Medicine

## 2023-10-22 ENCOUNTER — Other Ambulatory Visit: Payer: Self-pay

## 2023-10-22 DIAGNOSIS — M542 Cervicalgia: Secondary | ICD-10-CM

## 2023-10-22 DIAGNOSIS — G8929 Other chronic pain: Secondary | ICD-10-CM

## 2023-10-27 DIAGNOSIS — E559 Vitamin D deficiency, unspecified: Secondary | ICD-10-CM | POA: Diagnosis not present

## 2023-10-27 DIAGNOSIS — E039 Hypothyroidism, unspecified: Secondary | ICD-10-CM | POA: Diagnosis not present

## 2023-10-27 DIAGNOSIS — M810 Age-related osteoporosis without current pathological fracture: Secondary | ICD-10-CM | POA: Diagnosis not present

## 2023-10-27 NOTE — Progress Notes (Unsigned)
 [name] [address] [place] [address] Phone: [phone] Subjective:   [name], am serving as a scribe for Dr. Ronnell Coins.  I'm seeing this patient by the request  of:  Plotnikov, Oakley Bellman, MD  CC: Back pain follow-up  [id number]  08/18/2023 Having more of the radicular symptoms. Does have the nerve conduction test showing an L5 nerve root distribution previously bilaterally. Do feel an epidural could be beneficial again. Does have the underlying osteoporosis to play a role. In addition to this we did discuss the possibility of Cymbalta helping with some of the nerve pain. Does want her to continue to work on the B12 deficiency and is on the weekly injections at the moment. Discussed icing regimen and home exercises. Discussed continuing to stay active. Continue to wear good shoes. Follow-up again in 6 to 8 weeks   Updated 10/28/2023 [name] is a 68 y.o. female coming in with complaint of back pain, bilateral foot pain, and new onset left shoulder and neck pain.  Epidural March 5. Patient sent a MyChart and we did send patient for physical therapy. Epidural helped reduce L knee pain. It did not help with numbness in her feet. Continues to have some lower back pain on L side.   Also notes tightness in neck and L shoulder. Will start PT in May. Limited rotation.   Also states that she developed another neuroma in the R foot. Continues B12 injections which have helped.   Left knee has improved after the epidural.  Patient states.  Continuing to have onset paresthesia and continuing B12 injections.  Concerned because continuing to have more of a send tenderness that consistent with an AMI she believes.    Past Medical History:  Diagnosis Date   ALLERGIC RHINITIS 03/02/2007   Anemia    ANXIETY 03/02/2007   Arthritis    ASTHMA 11/12/2007   Dr Valera Gaster   Cancer [facility])    skin, hx of   Dysrhythmia    pt. states at night will notice  a different heart rhythem   Endometrial cancer (HCC)    FIBROCYSTIC BREAST DISEASE, HX OF 03/02/2007   GERD 03/02/2007   HYPOTHYROIDISM 11/12/2007   Dr Ronelle Coffee   Neuromuscular disorder (HCC)    carpel tunnel syndrome, compression of ulner nerve at elbows   OSTEOPENIA 11/12/2007   PEPTIC ULCER DISEASE 03/02/2007   Pneumonia    hx. of   Radiation 09/27/15-10/23/15   HDR to vaginal cuff 30 Gy   Stress fracture    TMJ SYNDROME 11/12/2007   Past Surgical History:  Procedure Laterality Date   2008 TMJ surgery      CARPAL TUNNEL WITH CUBITAL TUNNEL Left 07/2019   carpel tunnel Right 11/20/2016   colonoscopy x 2     cubital tunnel release Right 11/20/2016   deviated septum surgery - 2007     DILATION AND CURETTAGE OF UTERUS     2017   endoscopy - 1990     Mandib advancement forward  2009   right knee meniscus Right 07/16/2022   ROBOTIC ASSISTED TOTAL HYSTERECTOMY WITH BILATERAL SALPINGO OOPHERECTOMY Bilateral 08/14/2015   Procedure: XI ROBOTIC ASSISTED TOTAL HYSTERECTOMY WITH BILATERAL SALPINGO OOPHORECTOMY AND SENTAL LYMPH NODE BIOPSY;  Surgeon: Alphonso Aschoff, MD;  Location: [facility];  Service: Gynecology;  Laterality: Bilateral;   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     Social History   Socioeconomic History   Marital status:  Married    Spouse name: Not on file   Number of children: Not on file   Years of education: Not on file   Highest education level: Bachelor's degree (e.g., BA, AB, BS)  Occupational History   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[employer]  Tobacco Use   Smoking status: Never   Smokeless tobacco: Never   Tobacco comments:    Married, 1 child  Vaping Use   Vaping status: Never Used  Substance and Sexual Activity   Alcohol use: Yes    Alcohol/week: 2.0 standard drinks of alcohol    Types: 1 Glasses of wine, 1 Cans of beer per week    Comment: daily   Drug use: No   Sexual activity: Yes  Other Topics Concern   Not on file  Social History Narrative    Not on file   Social Drivers of Health   Financial Resource Strain: Low Risk  (07/29/2023)   Overall Financial Resource Strain (CARDIA)    Difficulty of Paying Living Expenses: Not hard at all  Food Insecurity: No Food Insecurity (07/29/2023)   Hunger Vital Sign    Worried About Running Out of Food in the Last Year: Never true    Ran Out of Food in the Last Year: Never true  Transportation Needs: No Transportation Needs (07/29/2023)   PRAPARE - Administrator, Civil Service (Medical): No    Lack of Transportation (Non-Medical): No  Physical Activity: Sufficiently Active (07/29/2023)   Exercise Vital Sign    Days of Exercise per Week: 6 days    Minutes of Exercise per Session: 40 min  Stress: No Stress Concern Present (07/29/2023)   [employer] of Occupational Health - Occupational Stress Questionnaire    Feeling of Stress : Not at all  Social Connections: Socially Integrated (07/29/2023)   Social Connection and Isolation Panel [NHANES]    Frequency of Communication with Friends and Family: Twice a week    Frequency of Social Gatherings with Friends and Family: Twice a week    Attends Religious Services: More than 4 times per year    Active Member of [employer] or Organizations: Yes    Attends Engineer, structural: More than 4 times per year    Marital Status: Married   Allergies  Allergen Reactions   Ibuprofen Other (See Comments)    Gastritis    Shellfish Allergy Other (See Comments)    Abdominal pain and Diarrhea   Colchicine      diarrhea   Astroglide [Gyne-Moistrin] Other (See Comments)    Itching and burning   Demerol Nausea And Vomiting   Nsaids Nausea Only    Other Reaction(s): GI Intolerance   Penicillins Hives and Other (See Comments)    Face turned red and had a headache. Has patient had a PCN reaction causing immediate rash, facial/tongue/throat swelling, SOB or lightheadedness with hypotension: no Has patient had a PCN reaction causing  severe rash involving mucus membranes or skin necrosis: no Has patient had a PCN reaction that required hospitalization: no Has patient had a PCN reaction occurring within the last 10 years: no If all of the above answers are "NO", then may proceed with Cephalosporin use.   Family History  Problem Relation Age of Onset   Hypertension Father    Heart disease Father 33       chf   Heart disease Mother 69       CAD   Hypertension Other    Asthma Neg Hx  Current Outpatient Medications (Endocrine & Metabolic):    levothyroxine  (SYNTHROID , LEVOTHROID) 25 MCG tablet, Take 25-50 mcg by mouth daily. She takes one tablet daily 4 days per week (Monday-Thursday) and two tablets daily 3 days per week (Friday-Sunday).   liothyronine  (CYTOMEL ) 5 MCG tablet, Take 5-10 mcg by mouth 2 (two) times daily. She takes one tablet in the morning and two tablets midday.   zoledronic  acid (RECLAST ) 5 MG/100ML SOLN injection, Inject 5 mg into the vein. Once a year  Current Outpatient Medications (Cardiovascular):    EPIPEN 2-PAK 0.3 MG/0.3ML DEVI, Inject 0.3 mg into the muscle once. Reported on 09/03/2015  Current Outpatient Medications (Respiratory):    ARNUITY ELLIPTA  100 MCG/ACT AEPB, Inhale 1 puff into the lungs daily.   cetirizine (ZYRTEC) 5 MG tablet, Take 5 mg by mouth 2 (two) times daily as needed for allergies.   fluticasone  (FLONASE) 50 MCG/ACT nasal spray, Place 1 spray into the nose daily as needed for allergies. Reported on 08/06/2015   montelukast (SINGULAIR) 10 MG tablet, Take 10 mg by mouth daily.   Current Outpatient Medications (Hematological):    cyanocobalamin  (VITAMIN B12) 1000 MCG/ML injection, Give 1000 mcg of Vitamin b12 sq daily x 1 week, then weekly for 1 month, then once every 2 weeks  Current Outpatient Medications (Other):    Calcium Citrate-Vitamin D  (CALCIUM CITRATE +D PO), Take 2 tablets by mouth daily.    Diclofenac  Sodium 2 % SOLN, Place 2 g onto the skin 2 (two) times  daily.   estradiol  (ESTRACE ) 0.1 MG/GM vaginal cream, PLACE 1 APPLICATORFUL VAGINALLY 3 (THREE) TIMES A WEEK   MELATONIN-CHAMOMILE PO, Take 1 capsule by mouth at bedtime as needed and may repeat dose one time if needed (For sleep.).    METRONIDAZOLE , TOPICAL, 0.75 % LOTN, Apply 1 application topically at bedtime.   minoxidil (ROGAINE) 2 % external solution, Apply 1 application topically 4 (four) times a week.    Multiple Vitamin (MULTIVITAMIN WITH MINERALS) TABS tablet, Take 1 tablet by mouth daily.   SYRINGE-NEEDLE, DISP, 3 ML (B-D INTEGRA SYRINGE) 25G X 5/8" 3 ML MISC, As directed sq   venlafaxine  XR (EFFEXOR -XR) 37.5 MG 24 hr capsule, TAKE 1 CAPSULE BY MOUTH EVERY DAY WITH BREAKFAST (Patient taking differently: Take 37.5 mg by mouth. 3 times a week)   Reviewed prior external information including notes and imaging from  primary care provider As well as notes that were available from care everywhere and other healthcare systems.  Past medical history, social, surgical and family history all reviewed in electronic medical record.  No pertanent information unless stated regarding to the chief complaint.   Review of Systems:  No headache, visual changes, nausea, vomiting, diarrhea, constipation, dizziness, abdominal pain, skin rash, fevers, chills, night sweats, weight loss, swollen lymph nodes, body aches, joint swelling, chest pain, shortness of breath, mood changes. POSITIVE muscle aches  Objective  There were no vitals taken for this visit.   General: No apparent distress alert and oriented x3 mood and affect normal, dressed appropriately.  HEENT: Pupils equal, extraocular movements intact  Respiratory: Patient's speak in full sentences and does not appear short of breath  Cardiovascular: No lower extremity edema, non tender, no erythema  Neck exam shows still has some loss lordosis noted.  Some tightness noted with sidebending in the neck bilaterally.  Back exam shows that he does  have tightness noted with extension.  Negative straight leg test noted.  Neurovascular intact distally.  Limited muscular skeletal ultrasound was performed  and interpreted by Ronnell Coins, M  Right foot exam does show a breakdown between the 4th and 5th metatarsal heads.  T hypoechoic changes that is consistent with a neuroma noted. Impression: Neuroma of right foot      Impression and Recommendations:    The above documentation has been reviewed and is accurate and complete Jahzara Slattery M Fradel Baldonado, DO

## 2023-10-28 ENCOUNTER — Other Ambulatory Visit: Payer: Self-pay

## 2023-10-28 ENCOUNTER — Ambulatory Visit: Admitting: Family Medicine

## 2023-10-28 ENCOUNTER — Encounter: Payer: Self-pay | Admitting: Family Medicine

## 2023-10-28 VITALS — BP 102/72 | HR 52 | Ht 64.0 in

## 2023-10-28 DIAGNOSIS — M810 Age-related osteoporosis without current pathological fracture: Secondary | ICD-10-CM

## 2023-10-28 DIAGNOSIS — G5781 Other specified mononeuropathies of right lower limb: Secondary | ICD-10-CM | POA: Diagnosis not present

## 2023-10-28 DIAGNOSIS — M216X2 Other acquired deformities of left foot: Secondary | ICD-10-CM | POA: Diagnosis not present

## 2023-10-28 DIAGNOSIS — M25512 Pain in left shoulder: Secondary | ICD-10-CM | POA: Diagnosis not present

## 2023-10-28 MED ORDER — DULOXETINE HCL 20 MG PO CPEP: 20.0000 mg | ORAL_CAPSULE | Freq: Every day | ORAL | 0 refills | Status: DC

## 2023-10-28 NOTE — Assessment & Plan Note (Signed)
 Did discuss this.  Reclast  is not working anymore at this point.  He sounds like the endocrinologist will think other medicines.  We did discuss the potential for Prolia patient's young age and this being lifelong will be difficult.  Patient will consider it.

## 2023-10-28 NOTE — Assessment & Plan Note (Signed)
 Neuroma noted.  Is coming back again.  Continue the B12.  Start Cymbalta which I think would be beneficial as well.  Seeing how patient responds to that.  Discussed icing regimen and home exercises.  Follow-up again in 6 to 8 weeks otherwise.

## 2023-10-28 NOTE — Assessment & Plan Note (Signed)
 Discussed posture and ergonomics, discussed which activities to do and which ones to avoid.  Increase activity slowly.  Continue the good shoes.

## 2023-10-28 NOTE — Patient Instructions (Addendum)
 Cymbalta 20mg  alternating days with Effexor  Keep monitoring foot Do prescribed exercises at least 3x a week  See you again in 2 months

## 2023-11-11 DIAGNOSIS — M810 Age-related osteoporosis without current pathological fracture: Secondary | ICD-10-CM | POA: Diagnosis not present

## 2023-11-12 DIAGNOSIS — M542 Cervicalgia: Secondary | ICD-10-CM | POA: Diagnosis not present

## 2023-11-12 DIAGNOSIS — M79671 Pain in right foot: Secondary | ICD-10-CM | POA: Diagnosis not present

## 2023-11-12 DIAGNOSIS — G5781 Other specified mononeuropathies of right lower limb: Secondary | ICD-10-CM | POA: Diagnosis not present

## 2023-12-03 ENCOUNTER — Encounter: Payer: Self-pay | Admitting: Internal Medicine

## 2023-12-03 ENCOUNTER — Ambulatory Visit

## 2023-12-03 VITALS — Ht 64.5 in | Wt 134.0 lb

## 2023-12-03 DIAGNOSIS — Z0000 Encounter for general adult medical examination without abnormal findings: Secondary | ICD-10-CM | POA: Diagnosis not present

## 2023-12-03 DIAGNOSIS — Z1159 Encounter for screening for other viral diseases: Secondary | ICD-10-CM | POA: Diagnosis not present

## 2023-12-03 DIAGNOSIS — M542 Cervicalgia: Secondary | ICD-10-CM | POA: Diagnosis not present

## 2023-12-03 DIAGNOSIS — G5781 Other specified mononeuropathies of right lower limb: Secondary | ICD-10-CM | POA: Diagnosis not present

## 2023-12-03 DIAGNOSIS — M79671 Pain in right foot: Secondary | ICD-10-CM | POA: Diagnosis not present

## 2023-12-03 NOTE — Progress Notes (Cosign Needed Addendum)
 Subjective:   [name] is a 68 y.o. who presents for a Medicare Wellness preventive visit.  As a reminder, Annual Wellness Visits don't include a physical exam, and some assessments may be limited, especially if this visit is performed virtually. We may recommend an in-person follow-up visit with your provider if needed.  Visit Complete: Virtual I connected with  Deborah Falling on 12/03/23 by a audio enabled telemedicine application and verified that I am speaking with the correct person using two identifiers.  Patient Location: Home  Provider Location: Office/Clinic  I discussed the limitations of evaluation and management by telemedicine. The patient expressed understanding and agreed to proceed.  Vital Signs: Because this visit was a virtual/telehealth visit, some criteria may be missing or patient reported. Any vitals not documented were not able to be obtained and vitals that have been documented are patient reported.  VideoDeclined- This patient declined Librarian, academic. Therefore the visit was completed with audio only.  Persons Participating in Visit: Patient.  AWV Questionnaire: Yes: Patient Medicare AWV questionnaire was completed by the patient on 11/30/2023; I have confirmed that all information answered by patient is correct and no changes since this date.  Cardiac Risk Factors include: advanced age (>41men, >6 women)     Objective:     Today's Vitals   12/03/23 1313  Weight: 134 lb (60.8 kg)  Height: 5' 4.5" (1.638 m)   Body mass index is 22.65 kg/m.     12/03/2023    1:12 PM 11/27/2022   12:51 PM 05/30/2020    5:37 PM 05/20/2018    9:20 AM 01/19/2018    3:31 PM 11/16/2017   10:27 AM 06/17/2017    1:10 PM  Advanced Directives  Does Patient Have a Medical Advance Directive? Yes Yes Yes Yes Yes Yes Yes  Type of Estate agent of [place];Living will Healthcare Power of [place];Living will Healthcare  Power of [place];Living will Healthcare Power of [place];Living will Healthcare Power of [place];Living will  Healthcare Power of [place];Living will  Does patient want to make changes to medical advance directive? No - Patient declined No - Patient declined       Copy of Healthcare Power of Attorney in Chart? Yes - validated most recent copy scanned in chart (See row information) Yes - validated most recent copy scanned in chart (See row information) No - copy requested No - copy requested No - copy requested  No - copy requested    Current Medications (verified) Outpatient Encounter Medications as of 12/03/2023  Medication Sig   ARNUITY ELLIPTA  100 MCG/ACT AEPB Inhale 1 puff into the lungs daily.   Calcium Citrate-Vitamin D  (CALCIUM CITRATE +D PO) Take 2 tablets by mouth daily.    cetirizine (ZYRTEC) 5 MG tablet Take 5 mg by mouth 2 (two) times daily as needed for allergies.   cyanocobalamin  (VITAMIN B12) 1000 MCG/ML injection Give 1000 mcg of Vitamin b12 sq daily x 1 week, then weekly for 1 month, then once every 2 weeks   Diclofenac  Sodium 2 % SOLN Place 2 g onto the skin 2 (two) times daily.   DULoxetine  (CYMBALTA ) 20 MG capsule Take 1 capsule (20 mg total) by mouth daily.   EPIPEN 2-PAK 0.3 MG/0.3ML DEVI Inject 0.3 mg into the muscle once. Reported on 09/03/2015   estradiol  (ESTRACE ) 0.1 MG/GM vaginal cream PLACE 1 APPLICATORFUL VAGINALLY 3 (THREE) TIMES A WEEK   fluticasone  (FLONASE) 50 MCG/ACT nasal spray Place 1 spray into the  nose daily as needed for allergies. Reported on 08/06/2015   levothyroxine  (SYNTHROID , LEVOTHROID) 25 MCG tablet Take 25-50 mcg by mouth daily. She takes one tablet daily 4 days per week (Monday-Thursday) and two tablets daily 3 days per week (Friday-Sunday).   liothyronine (CYTOMEL) 5 MCG tablet Take 5-10 mcg by mouth 2 (two) times daily. She takes one tablet in the morning and two tablets midday.   MELATONIN-CHAMOMILE PO Take 1 capsule by mouth at bedtime as needed  and may repeat dose one time if needed (For sleep.).    METRONIDAZOLE, TOPICAL, 0.75 % LOTN Apply 1 application topically at bedtime.   minoxidil (ROGAINE) 2 % external solution Apply 1 application topically 4 (four) times a week.    Multiple Vitamin (MULTIVITAMIN WITH MINERALS) TABS tablet Take 1 tablet by mouth daily.   SYRINGE-NEEDLE, DISP, 3 ML (B-D INTEGRA SYRINGE) 25G X 5/8" 3 ML MISC As directed sq   venlafaxine XR (EFFEXOR-XR) 37.5 MG 24 hr capsule TAKE 1 CAPSULE BY MOUTH EVERY DAY WITH BREAKFAST (Patient taking differently: Take 37.5 mg by mouth. 3 times a week)   zoledronic acid (RECLAST) 5 MG/100ML SOLN injection Inject 5 mg into the vein. Once a year   montelukast (SINGULAIR) 10 MG tablet Take 10 mg by mouth daily. (Patient not taking: Reported on 12/03/2023)   [DISCONTINUED] escitalopram (LEXAPRO) 10 MG tablet Take 10 mg by mouth daily.     No facility-administered encounter medications on file as of 12/03/2023.    Allergies (verified) Ibuprofen, Shellfish allergy, Colchicine, Astroglide [gyne-moistrin], Demerol, Nsaids, and Penicillins   History: Past Medical History:  Diagnosis Date   ALLERGIC RHINITIS 03/02/2007   Anemia    ANXIETY 03/02/2007   Arthritis    ASTHMA 11/12/2007   Dr Whelan   Cancer (HCC)    skin, hx of   Dysrhythmia    pt. states at night will notice a different heart rhythem   Endometrial cancer (HCC)    FIBROCYSTIC BREAST DISEASE, HX OF 03/02/2007   GERD 03/02/2007   HYPOTHYROIDISM 11/12/2007   Dr Balan   Neuromuscular disorder (HCC)    carpel tunnel syndrome, compression of ulner nerve at elbows   OSTEOPENIA 11/12/2007   PEPTIC ULCER DISEASE 03/02/2007   Pneumonia    hx. of   Radiation 09/27/15-10/23/15   HDR to vaginal cuff 30 Gy   Stress fracture    TMJ SYNDROME 11/12/2007   Past Surgical History:  Procedure Laterality Date   2008 TMJ surgery      CARPAL TUNNEL WITH CUBITAL TUNNEL Left 07/2019   carpel tunnel Right 11/20/2016   colonoscopy x 2      cubital tunnel release Right 11/20/2016   deviated septum surgery - 2007     DILATION AND CURETTAGE OF UTERUS     20 18   endoscopy - 1990     Mandib advancement forward  2009   right knee meniscus Right 07/16/2022   ROBOTIC ASSISTED TOTAL HYSTERECTOMY WITH BILATERAL SALPINGO OOPHERECTOMY Bilateral 08/14/2015   Procedure: XI ROBOTIC ASSISTED TOTAL HYSTERECTOMY WITH BILATERAL SALPINGO OOPHORECTOMY AND SENTAL LYMPH NODE BIOPSY;  Surgeon: Alphonso Aschoff, MD;  Location: [facility];  Service: Gynecology;  Laterality: Bilateral;   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     Family History  Problem Relation Age of Onset   Hypertension Father    Heart disease Father 1       chf   Heart disease Mother 48       CAD  Hypertension Other    Asthma Neg Hx    Social History   Socioeconomic History   Marital status: Married    Spouse name: Not on file   Number of children: Not on file   Years of education: Not on file   Highest education level: Bachelor's degree (e.g., BA, AB, BS)  Occupational History   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[employer]  Tobacco Use   Smoking status: Never   Smokeless tobacco: Never   Tobacco comments:    Married, 1 child  Vaping Use   Vaping status: Never Used  Substance and Sexual Activity   Alcohol use: Yes    Alcohol/week: 2.0 standard drinks of alcohol    Types: 1 Glasses of wine, 1 Cans of beer per week    Comment: daily   Drug use: No   Sexual activity: Yes  Other Topics Concern   Not on file  Social History Narrative   Not on file   Social Drivers of Health   Financial Resource Strain: Low Risk  (12/03/2023)   Overall Financial Resource Strain (CARDIA)    Difficulty of Paying Living Expenses: Not hard at all  Food Insecurity: No Food Insecurity (12/03/2023)   Hunger Vital Sign    Worried About Running Out of Food in the Last Year: Never true    Ran Out of Food in the Last Year: Never true  Transportation Needs: No Transportation Needs  (12/03/2023)   PRAPARE - Administrator, Civil Service (Medical): No    Lack of Transportation (Non-Medical): No  Physical Activity: Sufficiently Active (12/03/2023)   Exercise Vital Sign    Days of Exercise per Week: 6 days    Minutes of Exercise per Session: 40 min  Stress: No Stress Concern Present (12/03/2023)   [employer] of Occupational Health - Occupational Stress Questionnaire    Feeling of Stress : Not at all  Social Connections: Socially Integrated (12/03/2023)   Social Connection and Isolation Panel [NHANES]    Frequency of Communication with Friends and Family: Twice a week    Frequency of Social Gatherings with Friends and Family: Twice a week    Attends Religious Services: More than 4 times per year    Active Member of [employer] or Organizations: Yes    Attends Engineer, structural: More than 4 times per year    Marital Status: Married    Tobacco Counseling Counseling given: No Tobacco comments: Married, 1 child    Clinical Intake:  Pre-visit preparation completed: Yes  Pain : No/denies pain     BMI - recorded: 22.65 Nutritional Status: BMI of 19-24  Normal Nutritional Risks: None Diabetes: No  No results found for: "HGBA1C"   How often do you need to have someone help you when you read instructions, pamphlets, or other written materials from your doctor or pharmacy?: 1 - Never  Interpreter Needed?: No  Information entered by :: Kandy Orris, CMA   Activities of Daily Living     12/03/2023    1:18 PM 11/30/2023   10:27 PM  In your present state of health, do you have any difficulty performing the following activities:  Hearing? 0 0  Vision? 0 0  Difficulty concentrating or making decisions? 0 0  Walking or climbing stairs? 0 0  Dressing or bathing? 0 0  Doing errands, shopping? 0 0  Preparing Food and eating ? N N  Using the Toilet? N N  In the past six months, have you accidently  leaked urine? N N  Do you have problems with  loss of bowel control? N N  Managing your Medications? N N  Managing your Finances? N N  Housekeeping or managing your Housekeeping? N N    Patient Care Team: Plotnikov, Oakley Bellman, MD as PCP - General Thurman Flores, MD as PCP - OBGYN (Obstetrics and Gynecology) Anselmo Kings, MD as Referring Physician (Allergy and Immunology) Tasia Farr, MD as Consulting Physician (Endocrinology) Jewell, Jolene R, MD as Consulting Physician (Dermatology) Jinny Mounts, PA-C as Physician Assistant (Internal Medicine) Dorean Gambles, OD (Optometry)  I have updated your Care Teams any recent Medical Services you may have received from other providers in the past year.     Assessment:    This is a routine wellness examination for [place].  Hearing/Vision screen Hearing Screening - Comments:: Denies hearing difficulties   Vision Screening - Comments:: Wears rx glasses - up to date with routine eye exams with Dr Rochelle Chu   Goals Addressed               This Visit's Progress     Patient Stated (pt-stated)        Patient stated she's managing her alcohol intake       Depression Screen     12/03/2023    1:20 PM 11/27/2022   12:49 PM 09/29/2022    8:33 AM 09/17/2021    9:21 AM 08/30/2021   11:27 AM 07/05/2019    8:56 AM 05/20/2018    9:24 AM  PHQ 2/9 Scores  PHQ - 2 Score 0 0 2 0 2 0 0  PHQ- 9 Score 0 0 8 1 6       Fall Risk     12/03/2023    1:18 PM 11/30/2023   10:27 PM 11/27/2022   12:52 PM 09/29/2022    8:33 AM 09/17/2021    9:21 AM  Fall Risk   Falls in the past year? 0 1 1 1 1   Comment No falls - confirmed w/pt      Number falls in past yr: 0 0 1 0 0  Injury with Fall? 0 0 1 1 0  Risk for fall due to : No Fall Risks  History of fall(s);Impaired balance/gait;Orthopedic patient History of fall(s);No Fall Risks   Follow up Falls evaluation completed;Falls prevention discussed  Education provided;Falls prevention discussed;Falls evaluation completed Falls evaluation completed      MEDICARE RISK AT HOME:  Medicare Risk at Home Any stairs in or around the home?: Yes If so, are there any without handrails?: No Home free of loose throw rugs in walkways, pet beds, electrical cords, etc?: Yes Adequate lighting in your home to reduce risk of falls?: Yes Life alert?: No Use of a cane, walker or w/c?: No Grab bars in the bathroom?: No Shower chair or bench in shower?: No Elevated toilet seat or a handicapped toilet?: No  TIMED UP AND GO:  Was the test performed?  No  Cognitive Function: 6CIT completed        12/03/2023    1:19 PM 11/27/2022   12:49 PM  6CIT Screen  What Year? 0 points   What month? 0 points   What time? 0 points 0 points  Count back from 20 0 points 0 points  Months in reverse 0 points 0 points  Repeat phrase 2 points 0 points  Total Score 2 points     Immunizations Immunization History  Administered Date(s) Administered   Influenza Inj Mdck Quad Pf  03/17/2018   Influenza Inj Mdck Quad With Preservative 03/22/2019   Influenza, High Dose Seasonal PF 03/24/2012, 09/10/2016, 09/09/2017, 07/23/2018   Influenza,inj,Quad PF,6+ Mos 03/27/2020   Influenza-Unspecified 03/02/2017, 03/04/2021   PFIZER(Purple Top)SARS-COV-2 Vaccination 06/28/2019, 07/18/2019, 04/20/2020, 01/02/2021, 03/18/2021   PNEUMOCOCCAL CONJUGATE-20 09/17/2021   Pfizer Covid-19 Vaccine Bivalent Booster 67yrs & up 04/08/2021   Td 05/17/1999   Tdap 12/01/2014   Unspecified SARS-COV-2 Vaccination 06/28/2019, 07/18/2019    Screening Tests Health Maintenance  Topic Date Due   Hepatitis C Screening  Never done   Zoster Vaccines- Shingrix (1 of 2) Never done   COVID-19 Vaccine (8 - 2024-25 season) 03/01/2023   INFLUENZA VACCINE  01/29/2024   MAMMOGRAM  10/01/2024   DTaP/Tdap/Td (3 - Td or Tdap) 11/30/2024   Medicare Annual Wellness (AWV)  12/02/2024   Colonoscopy  06/16/2032   Pneumonia Vaccine 47+ Years old  Completed   DEXA SCAN  Completed   HPV VACCINES  Aged Out    Meningococcal B Vaccine  Aged Out    Health Maintenance  Health Maintenance Due  Topic Date Due   Hepatitis C Screening  Never done   Zoster Vaccines- Shingrix (1 of 2) Never done   COVID-19 Vaccine (8 - 2024-25 season) 03/01/2023   Health Maintenance Items Addressed:  Labs Ordered: Hepatitis C Screening  Additional Screening:  Vision Screening: Recommended annual ophthalmology exams for early detection of glaucoma and other disorders of the eye. Would you like a referral to an eye doctor? No    Dental Screening: Recommended annual dental exams for proper oral hygiene  Community Resource Referral / Chronic Care Management: CRR required this visit?  No   CCM required this visit?  No   Plan:    I have personally reviewed and noted the following in the patient's chart:   Medical and social history Use of alcohol, tobacco or illicit drugs  Current medications and supplements including opioid prescriptions. Patient is not currently taking opioid prescriptions. Functional ability and status Nutritional status Physical activity Advanced directives List of other physicians Hospitalizations, surgeries, and ER visits in previous 12 months Vitals Screenings to include cognitive, depression, and falls Referrals and appointments  In addition, I have reviewed and discussed with patient certain preventive protocols, quality metrics, and best practice recommendations. A written personalized care plan for preventive services as well as general preventive health recommendations were provided to patient.   Patria Bookbinder, CMA   12/03/2023   After Visit Summary: (MyChart) Due to this being a telephonic visit, the after visit summary with patients personalized plan was offered to patient via MyChart   Notes: Nothing significant to report at this time.  Medical screening examination/treatment/procedure(s) were performed by non-physician practitioner and as supervising physician I  was immediately available for consultation/collaboration.  I agree with above. Adelaide Holy, MD

## 2023-12-03 NOTE — Patient Instructions (Signed)
 [name] , Thank you for taking time out of your busy schedule to complete your Annual Wellness Visit with me. I enjoyed our conversation and look forward to speaking with you again next year. I, as well as your care team,  appreciate your ongoing commitment to your health goals. Please review the following plan we discussed and let me know if I can assist you in the future. Your Game plan/ To Do List   Follow up Visits: Next Medicare AWV with our clinical staff: 12/06/2024   Have you seen your provider in the last 6 months (3 months if uncontrolled diabetes)? Yes Next Office Visit with your provider: to be scheduled by patient  Clinician Recommendations:  Aim for 30 minutes of exercise or brisk walking, 6-8 glasses of water , and 5 servings of fruits and vegetables each day. Educated and advised on getting the Shingles vaccines.      This is a list of the screening recommended for you and due dates:  Health Maintenance  Topic Date Due   Hepatitis C Screening  Never done   Zoster (Shingles) Vaccine (1 of 2) Never done   COVID-19 Vaccine (8 - 2024-25 season) 03/01/2023   Flu Shot  01/29/2024   Mammogram  10/01/2024   DTaP/Tdap/Td vaccine (3 - Td or Tdap) 11/30/2024   Medicare Annual Wellness Visit  12/02/2024   Colon Cancer Screening  06/16/2032   Pneumonia Vaccine  Completed   DEXA scan (bone density measurement)  Completed   HPV Vaccine  Aged Out   Meningitis B Vaccine  Aged Out    Advanced directives: (In Chart) A copy of your advanced directives are scanned into your chart should your provider ever need it. Advance Care Planning is important because it:  [x]  Makes sure you receive the medical care that is consistent with your values, goals, and preferences  [x]  It provides guidance to your family and loved ones and reduces their decisional burden about whether or not they are making the right decisions based on your wishes.  Follow the link provided in your after visit summary or  read over the paperwork we have mailed to you to help you started getting your Advance Directives in place. If you need assistance in completing these, please reach out to us  so that we can help you!

## 2023-12-09 ENCOUNTER — Other Ambulatory Visit: Payer: Self-pay | Admitting: Internal Medicine

## 2023-12-09 MED ORDER — CYANOCOBALAMIN 1000 MCG/ML IJ SOLN: INTRAMUSCULAR | 6 refills | Status: AC

## 2023-12-10 DIAGNOSIS — M1812 Unilateral primary osteoarthritis of first carpometacarpal joint, left hand: Secondary | ICD-10-CM | POA: Diagnosis not present

## 2023-12-10 DIAGNOSIS — M810 Age-related osteoporosis without current pathological fracture: Secondary | ICD-10-CM | POA: Diagnosis not present

## 2023-12-17 NOTE — Progress Notes (Unsigned)
 [name] [address] [place] [address] Phone: [phone] Subjective:    I'm seeing this patient by the request  of:  Plotnikov, Oakley Bellman, MD  CC:   [id number]  10/28/2023 Discussed posture and ergonomics, discussed which activities to do and which ones to avoid.  Increase activity slowly.  Continue the good shoes.     Did discuss this. Reclast  is not working anymore at this point. He sounds like the endocrinologist will think other medicines. We did discuss the potential for Prolia patient's young age and this being lifelong will be difficult. Patient will consider it.   Neuroma noted.  Is coming back again.  Continue the B12.  Start Cymbalta  which I think would be beneficial as well.  Seeing how patient responds to that.  Discussed icing regimen and home exercises.  Follow-up again in 6 to 8 weeks otherwise.     Update 12/23/2023 [name] is a 68 y.o. female coming in with complaint of L shoulder and foot pain. Was going to start Cymbalta  and alternate days with Effexor . Patient states      Past Medical History:  Diagnosis Date   ALLERGIC RHINITIS 03/02/2007   Anemia    ANXIETY 03/02/2007   Arthritis    ASTHMA 11/12/2007   Dr Valera Gaster   Cancer [facility])    skin, hx of   Dysrhythmia    pt. states at night will notice a different heart rhythem   Endometrial cancer (HCC)    FIBROCYSTIC BREAST DISEASE, HX OF 03/02/2007   GERD 03/02/2007   HYPOTHYROIDISM 11/12/2007   Dr Ronelle Coffee   Neuromuscular disorder (HCC)    carpel tunnel syndrome, compression of ulner nerve at elbows   OSTEOPENIA 11/12/2007   PEPTIC ULCER DISEASE 03/02/2007   Pneumonia    hx. of   Radiation 09/27/15-10/23/15   HDR to vaginal cuff 30 Gy   Stress fracture    TMJ SYNDROME 11/12/2007   Past Surgical History:  Procedure Laterality Date   2008 TMJ surgery      CARPAL TUNNEL WITH CUBITAL TUNNEL Left 07/2019   carpel tunnel Right 11/20/2016   colonoscopy x 2     cubital  tunnel release Right 11/20/2016   deviated septum surgery - 2007     DILATION AND CURETTAGE OF UTERUS     2017   endoscopy - 1990     Mandib advancement forward  2009   right knee meniscus Right 07/16/2022   ROBOTIC ASSISTED TOTAL HYSTERECTOMY WITH BILATERAL SALPINGO OOPHERECTOMY Bilateral 08/14/2015   Procedure: XI ROBOTIC ASSISTED TOTAL HYSTERECTOMY WITH BILATERAL SALPINGO OOPHORECTOMY AND SENTAL LYMPH NODE BIOPSY;  Surgeon: Alphonso Aschoff, MD;  Location: [facility];  Service: Gynecology;  Laterality: Bilateral;   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     Social History   Socioeconomic History   Marital status: Married    Spouse name: Not on file   Number of children: Not on file   Years of education: Not on file   Highest education level: Bachelor's degree (e.g., BA, AB, BS)  Occupational History   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[employer]  Tobacco Use   Smoking status: Never   Smokeless tobacco: Never   Tobacco comments:    Married, 1 child  Vaping Use   Vaping status: Never Used  Substance and Sexual Activity   Alcohol use: Yes    Alcohol/week: 2.0 standard drinks of alcohol    Types: 1 Glasses of wine, 1  Cans of beer per week    Comment: daily   Drug use: No   Sexual activity: Yes  Other Topics Concern   Not on file  Social History Narrative   Not on file   Social Drivers of Health   Financial Resource Strain: Low Risk  (12/03/2023)   Overall Financial Resource Strain (CARDIA)    Difficulty of Paying Living Expenses: Not hard at all  Food Insecurity: No Food Insecurity (12/03/2023)   Hunger Vital Sign    Worried About Running Out of Food in the Last Year: Never true    Ran Out of Food in the Last Year: Never true  Transportation Needs: No Transportation Needs (12/03/2023)   PRAPARE - Administrator, Civil Service (Medical): No    Lack of Transportation (Non-Medical): No  Physical Activity: Sufficiently Active (12/03/2023)   Exercise Vital Sign     Days of Exercise per Week: 6 days    Minutes of Exercise per Session: 40 min  Stress: No Stress Concern Present (12/03/2023)   [employer] of Occupational Health - Occupational Stress Questionnaire    Feeling of Stress : Not at all  Social Connections: Socially Integrated (12/03/2023)   Social Connection and Isolation Panel    Frequency of Communication with Friends and Family: Twice a week    Frequency of Social Gatherings with Friends and Family: Twice a week    Attends Religious Services: More than 4 times per year    Active Member of [employer] or Organizations: Yes    Attends Engineer, structural: More than 4 times per year    Marital Status: Married   Allergies  Allergen Reactions   Ibuprofen Other (See Comments)    Gastritis    Shellfish Allergy Other (See Comments)    Abdominal pain and Diarrhea   Colchicine      diarrhea   Astroglide [Gyne-Moistrin] Other (See Comments)    Itching and burning   Demerol Nausea And Vomiting   Nsaids Nausea Only    Other Reaction(s): GI Intolerance   Penicillins Hives and Other (See Comments)    Face turned red and had a headache. Has patient had a PCN reaction causing immediate rash, facial/tongue/throat swelling, SOB or lightheadedness with hypotension: no Has patient had a PCN reaction causing severe rash involving mucus membranes or skin necrosis: no Has patient had a PCN reaction that required hospitalization: no Has patient had a PCN reaction occurring within the last 10 years: no If all of the above answers are NO, then may proceed with Cephalosporin use.   Family History  Problem Relation Age of Onset   Hypertension Father    Heart disease Father 39       chf   Heart disease Mother 74       CAD   Hypertension Other    Asthma Neg Hx     Current Outpatient Medications (Endocrine & Metabolic):    levothyroxine  (SYNTHROID , LEVOTHROID) 25 MCG tablet, Take 25-50 mcg by mouth daily. She takes one tablet daily 4 days  per week (Monday-Thursday) and two tablets daily 3 days per week (Friday-Sunday).   liothyronine  (CYTOMEL ) 5 MCG tablet, Take 5-10 mcg by mouth 2 (two) times daily. She takes one tablet in the morning and two tablets midday.   zoledronic  acid (RECLAST ) 5 MG/100ML SOLN injection, Inject 5 mg into the vein. Once a year  Current Outpatient Medications (Cardiovascular):    EPIPEN 2-PAK 0.3 MG/0.3ML DEVI, Inject 0.3 mg into the muscle  once. Reported on 09/03/2015  Current Outpatient Medications (Respiratory):    ARNUITY ELLIPTA  100 MCG/ACT AEPB, Inhale 1 puff into the lungs daily.   cetirizine (ZYRTEC) 5 MG tablet, Take 5 mg by mouth 2 (two) times daily as needed for allergies.   fluticasone  (FLONASE) 50 MCG/ACT nasal spray, Place 1 spray into the nose daily as needed for allergies. Reported on 08/06/2015   montelukast (SINGULAIR) 10 MG tablet, Take 10 mg by mouth daily. (Patient not taking: Reported on 12/03/2023)   Current Outpatient Medications (Hematological):    cyanocobalamin  (VITAMIN B12) 1000 MCG/ML injection, Give 1000 mcg of Vitamin b12 sq daily x 1 week, then weekly for 1 month, then once every 2 weeks  Current Outpatient Medications (Other):    Calcium Citrate-Vitamin D  (CALCIUM CITRATE +D PO), Take 2 tablets by mouth daily.    Diclofenac  Sodium 2 % SOLN, Place 2 g onto the skin 2 (two) times daily.   DULoxetine  (CYMBALTA ) 20 MG capsule, Take 1 capsule (20 mg total) by mouth daily.   estradiol  (ESTRACE ) 0.1 MG/GM vaginal cream, PLACE 1 APPLICATORFUL VAGINALLY 3 (THREE) TIMES A WEEK   MELATONIN-CHAMOMILE PO, Take 1 capsule by mouth at bedtime as needed and may repeat dose one time if needed (For sleep.).    METRONIDAZOLE , TOPICAL, 0.75 % LOTN, Apply 1 application topically at bedtime.   minoxidil (ROGAINE) 2 % external solution, Apply 1 application topically 4 (four) times a week.    Multiple Vitamin (MULTIVITAMIN WITH MINERALS) TABS tablet, Take 1 tablet by mouth daily.   SYRINGE-NEEDLE,  DISP, 3 ML (B-D INTEGRA SYRINGE) 25G X 5/8 3 ML MISC, As directed sq   venlafaxine  XR (EFFEXOR -XR) 37.5 MG 24 hr capsule, TAKE 1 CAPSULE BY MOUTH EVERY DAY WITH BREAKFAST (Patient taking differently: Take 37.5 mg by mouth. 3 times a week)   Reviewed prior external information including notes and imaging from  primary care provider As well as notes that were available from care everywhere and other healthcare systems.  Past medical history, social, surgical and family history all reviewed in electronic medical record.  No pertanent information unless stated regarding to the chief complaint.   Review of Systems:  No headache, visual changes, nausea, vomiting, diarrhea, constipation, dizziness, abdominal pain, skin rash, fevers, chills, night sweats, weight loss, swollen lymph nodes, body aches, joint swelling, chest pain, shortness of breath, mood changes. POSITIVE muscle aches  Objective  There were no vitals taken for this visit.   General: No apparent distress alert and oriented x3 mood and affect normal, dressed appropriately.  HEENT: Pupils equal, extraocular movements intact  Respiratory: Patient's speak in full sentences and does not appear short of breath  Cardiovascular: No lower extremity edema, non tender, no erythema      Impression and Recommendations:

## 2023-12-22 ENCOUNTER — Encounter: Payer: Self-pay | Admitting: Family Medicine

## 2023-12-23 ENCOUNTER — Ambulatory Visit (INDEPENDENT_AMBULATORY_CARE_PROVIDER_SITE_OTHER): Admitting: Family Medicine

## 2023-12-23 ENCOUNTER — Encounter: Payer: Self-pay | Admitting: Family Medicine

## 2023-12-23 VITALS — BP 118/82 | HR 63 | Ht 64.0 in | Wt 134.0 lb

## 2023-12-23 DIAGNOSIS — M810 Age-related osteoporosis without current pathological fracture: Secondary | ICD-10-CM | POA: Diagnosis not present

## 2023-12-23 DIAGNOSIS — M25512 Pain in left shoulder: Secondary | ICD-10-CM | POA: Diagnosis not present

## 2023-12-23 NOTE — Assessment & Plan Note (Signed)
 Patient given injection and tolerated the procedure well.  Discussed icing regimen and home exercises, which activities to do in which ones to avoid.  Increase activity slowly.  Follow-up again in 6 to 8 weeks discussed avoiding certain activities.  Discussed home exercises.  Patient will continue to work with formal physical therapist.  We also discussed the possibility of the scalenes and stretching this area.  Discussed different medications such as the Effexor  and changing to Lexapro or the journavax noted increase activity slowly.

## 2023-12-23 NOTE — Patient Instructions (Addendum)
 Scapula HEP  Good to see you Get back to you on the inserts  8 week follow up  Trigger point injections today

## 2023-12-29 DIAGNOSIS — Z6822 Body mass index (BMI) 22.0-22.9, adult: Secondary | ICD-10-CM | POA: Diagnosis not present

## 2023-12-29 DIAGNOSIS — Z01419 Encounter for gynecological examination (general) (routine) without abnormal findings: Secondary | ICD-10-CM | POA: Diagnosis not present

## 2024-01-05 DIAGNOSIS — G5781 Other specified mononeuropathies of right lower limb: Secondary | ICD-10-CM | POA: Diagnosis not present

## 2024-01-05 DIAGNOSIS — M542 Cervicalgia: Secondary | ICD-10-CM | POA: Diagnosis not present

## 2024-01-05 DIAGNOSIS — M79671 Pain in right foot: Secondary | ICD-10-CM | POA: Diagnosis not present

## 2024-01-07 DIAGNOSIS — M810 Age-related osteoporosis without current pathological fracture: Secondary | ICD-10-CM | POA: Diagnosis not present

## 2024-01-26 DIAGNOSIS — M79671 Pain in right foot: Secondary | ICD-10-CM | POA: Diagnosis not present

## 2024-01-26 DIAGNOSIS — M542 Cervicalgia: Secondary | ICD-10-CM | POA: Diagnosis not present

## 2024-01-26 DIAGNOSIS — G5781 Other specified mononeuropathies of right lower limb: Secondary | ICD-10-CM | POA: Diagnosis not present

## 2024-01-28 ENCOUNTER — Other Ambulatory Visit: Payer: Self-pay | Admitting: Family Medicine

## 2024-02-09 DIAGNOSIS — M810 Age-related osteoporosis without current pathological fracture: Secondary | ICD-10-CM | POA: Diagnosis not present

## 2024-02-09 NOTE — Progress Notes (Deleted)
 [name] [address] [place] [address] Phone: [phone] Subjective:    I'm seeing this patient by the request  of:  Plotnikov, Karlynn GAILS, MD  CC:   [id number]  12/23/2023 Patient given injection and tolerated the procedure well.  Discussed icing regimen and home exercises, which activities to do in which ones to avoid.  Increase activity slowly.  Follow-up again in 6 to 8 weeks discussed avoiding certain activities.  Discussed home exercises.  Patient will continue to work with formal physical therapist.  We also discussed the possibility of the scalenes and stretching this area.  Discussed different medications such as the Effexor  and changing to Lexapro or the journavax noted increase activity slowly.   Updated 02/17/2024 [name] is a 68 y.o. female coming in with complaint of shoulder pain       Past Medical History:  Diagnosis Date   ALLERGIC RHINITIS 03/02/2007   Anemia    ANXIETY 03/02/2007   Arthritis    ASTHMA 11/12/2007   Dr Cheryn   Cancer [facility])    skin, hx of   Dysrhythmia    pt. states at night will notice a different heart rhythem   Endometrial cancer (HCC)    FIBROCYSTIC BREAST DISEASE, HX OF 03/02/2007   GERD 03/02/2007   HYPOTHYROIDISM 11/12/2007   Dr Tommas   Neuromuscular disorder (HCC)    carpel tunnel syndrome, compression of ulner nerve at elbows   OSTEOPENIA 11/12/2007   PEPTIC ULCER DISEASE 03/02/2007   Pneumonia    hx. of   Radiation 09/27/15-10/23/15   HDR to vaginal cuff 30 Gy   Stress fracture    TMJ SYNDROME 11/12/2007   Past Surgical History:  Procedure Laterality Date   2008 TMJ surgery      CARPAL TUNNEL WITH CUBITAL TUNNEL Left 07/2019   carpel tunnel Right 11/20/2016   colonoscopy x 2     cubital tunnel release Right 11/20/2016   deviated septum surgery - 2007     DILATION AND CURETTAGE OF UTERUS     2017   endoscopy - 1990     Mandib advancement forward  2009   right knee meniscus  Right 07/16/2022   ROBOTIC ASSISTED TOTAL HYSTERECTOMY WITH BILATERAL SALPINGO OOPHERECTOMY Bilateral 08/14/2015   Procedure: XI ROBOTIC ASSISTED TOTAL HYSTERECTOMY WITH BILATERAL SALPINGO OOPHORECTOMY AND SENTAL LYMPH NODE BIOPSY;  Surgeon: Maurilio Ship, MD;  Location: [facility];  Service: Gynecology;  Laterality: Bilateral;   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     Social History   Socioeconomic History   Marital status: Married    Spouse name: Not on file   Number of children: Not on file   Years of education: Not on file   Highest education level: Bachelor's degree (e.g., BA, AB, BS)  Occupational History   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[employer]  Tobacco Use   Smoking status: Never   Smokeless tobacco: Never   Tobacco comments:    Married, 1 child  Vaping Use   Vaping status: Never Used  Substance and Sexual Activity   Alcohol use: Yes    Alcohol/week: 2.0 standard drinks of alcohol    Types: 1 Glasses of wine, 1 Cans of beer per week    Comment: daily   Drug use: No   Sexual activity: Yes  Other Topics Concern   Not on file  Social History Narrative   Not on file   Social Drivers of Health  Financial Resource Strain: Low Risk  (12/03/2023)   Overall Financial Resource Strain (CARDIA)    Difficulty of Paying Living Expenses: Not hard at all  Food Insecurity: No Food Insecurity (12/03/2023)   Hunger Vital Sign    Worried About Running Out of Food in the Last Year: Never true    Ran Out of Food in the Last Year: Never true  Transportation Needs: No Transportation Needs (12/03/2023)   PRAPARE - Administrator, Civil Service (Medical): No    Lack of Transportation (Non-Medical): No  Physical Activity: Sufficiently Active (12/03/2023)   Exercise Vital Sign    Days of Exercise per Week: 6 days    Minutes of Exercise per Session: 40 min  Stress: No Stress Concern Present (12/03/2023)   [employer] of Occupational Health - Occupational Stress  Questionnaire    Feeling of Stress : Not at all  Social Connections: Socially Integrated (12/03/2023)   Social Connection and Isolation Panel    Frequency of Communication with Friends and Family: Twice a week    Frequency of Social Gatherings with Friends and Family: Twice a week    Attends Religious Services: More than 4 times per year    Active Member of [employer] or Organizations: Yes    Attends Engineer, structural: More than 4 times per year    Marital Status: Married   Allergies  Allergen Reactions   Ibuprofen Other (See Comments)    Gastritis    Shellfish Allergy Other (See Comments)    Abdominal pain and Diarrhea   Colchicine      diarrhea   Astroglide [Gyne-Moistrin] Other (See Comments)    Itching and burning   Demerol Nausea And Vomiting   Nsaids Nausea Only    Other Reaction(s): GI Intolerance   Penicillins Hives and Other (See Comments)    Face turned red and had a headache. Has patient had a PCN reaction causing immediate rash, facial/tongue/throat swelling, SOB or lightheadedness with hypotension: no Has patient had a PCN reaction causing severe rash involving mucus membranes or skin necrosis: no Has patient had a PCN reaction that required hospitalization: no Has patient had a PCN reaction occurring within the last 10 years: no If all of the above answers are NO, then may proceed with Cephalosporin use.   Family History  Problem Relation Age of Onset   Hypertension Father    Heart disease Father 51       chf   Heart disease Mother 67       CAD   Hypertension Other    Asthma Neg Hx     Current Outpatient Medications (Endocrine & Metabolic):    levothyroxine  (SYNTHROID , LEVOTHROID) 25 MCG tablet, Take 25-50 mcg by mouth daily. She takes one tablet daily 4 days per week (Monday-Thursday) and two tablets daily 3 days per week (Friday-Sunday).   liothyronine  (CYTOMEL ) 5 MCG tablet, Take 5-10 mcg by mouth 2 (two) times daily. She takes one tablet in the  morning and two tablets midday.   zoledronic  acid (RECLAST ) 5 MG/100ML SOLN injection, Inject 5 mg into the vein. Once a year  Current Outpatient Medications (Cardiovascular):    EPIPEN 2-PAK 0.3 MG/0.3ML DEVI, Inject 0.3 mg into the muscle once. Reported on 09/03/2015  Current Outpatient Medications (Respiratory):    ARNUITY ELLIPTA  100 MCG/ACT AEPB, Inhale 1 puff into the lungs daily.   cetirizine (ZYRTEC) 5 MG tablet, Take 5 mg by mouth 2 (two) times daily as needed for allergies.  fluticasone  (FLONASE) 50 MCG/ACT nasal spray, Place 1 spray into the nose daily as needed for allergies. Reported on 08/06/2015   montelukast (SINGULAIR) 10 MG tablet, Take 10 mg by mouth daily. (Patient not taking: Reported on 12/03/2023)   Current Outpatient Medications (Hematological):    cyanocobalamin  (VITAMIN B12) 1000 MCG/ML injection, Give 1000 mcg of Vitamin b12 sq daily x 1 week, then weekly for 1 month, then once every 2 weeks  Current Outpatient Medications (Other):    Calcium Citrate-Vitamin D  (CALCIUM CITRATE +D PO), Take 2 tablets by mouth daily.    Diclofenac  Sodium 2 % SOLN, Place 2 g onto the skin 2 (two) times daily.   DULoxetine  (CYMBALTA ) 20 MG capsule, TAKE 1 CAPSULE BY MOUTH EVERY DAY   estradiol  (ESTRACE ) 0.1 MG/GM vaginal cream, PLACE 1 APPLICATORFUL VAGINALLY 3 (THREE) TIMES A WEEK   MELATONIN-CHAMOMILE PO, Take 1 capsule by mouth at bedtime as needed and may repeat dose one time if needed (For sleep.).    METRONIDAZOLE , TOPICAL, 0.75 % LOTN, Apply 1 application topically at bedtime.   minoxidil (ROGAINE) 2 % external solution, Apply 1 application topically 4 (four) times a week.    Multiple Vitamin (MULTIVITAMIN WITH MINERALS) TABS tablet, Take 1 tablet by mouth daily.   SYRINGE-NEEDLE, DISP, 3 ML (B-D INTEGRA SYRINGE) 25G X 5/8 3 ML MISC, As directed sq   venlafaxine  XR (EFFEXOR -XR) 37.5 MG 24 hr capsule, TAKE 1 CAPSULE BY MOUTH EVERY DAY WITH BREAKFAST (Patient taking differently: Take  37.5 mg by mouth. 3 times a week)   Reviewed prior external information including notes and imaging from  primary care provider As well as notes that were available from care everywhere and other healthcare systems.  Past medical history, social, surgical and family history all reviewed in electronic medical record.  No pertanent information unless stated regarding to the chief complaint.   Review of Systems:  No headache, visual changes, nausea, vomiting, diarrhea, constipation, dizziness, abdominal pain, skin rash, fevers, chills, night sweats, weight loss, swollen lymph nodes, body aches, joint swelling, chest pain, shortness of breath, mood changes. POSITIVE muscle aches  Objective  There were no vitals taken for this visit.   General: No apparent distress alert and oriented x3 mood and affect normal, dressed appropriately.  HEENT: Pupils equal, extraocular movements intact  Respiratory: Patient's speak in full sentences and does not appear short of breath  Cardiovascular: No lower extremity edema, non tender, no erythema      Impression and Recommendations:

## 2024-02-17 ENCOUNTER — Ambulatory Visit: Admitting: Family Medicine

## 2024-02-24 DIAGNOSIS — F3342 Major depressive disorder, recurrent, in full remission: Secondary | ICD-10-CM | POA: Diagnosis not present

## 2024-03-03 NOTE — Progress Notes (Unsigned)
 [name] [address] [place] [address] Phone: [phone] Subjective:   [name], am serving as a scribe for Dr. Arthea Claudene.  I'm seeing this patient by the request  of:  Plotnikov, Karlynn GAILS, MD  CC: Left SI joint, foot pain  YEP:Dlagzrupcz  12/23/2023 Patient given injection and tolerated the procedure well.  Discussed icing regimen and home exercises, which activities to do in which ones to avoid.  Increase activity slowly.  Follow-up again in 6 to 8 weeks discussed avoiding certain activities.  Discussed home exercises.  Patient will continue to work with formal physical therapist.  We also discussed the possibility of the scalenes and stretching this area.  Discussed different medications such as the Effexor  and changing to Lexapro or the journavax noted increase activity slowly.      Update 03/04/2024 [name] is a 68 y.o. female coming in with complaint of thoracic spine pain. T spine is doing well. Massage therapist said that L SI joint is tight.   Patient states that her L shoulder is tender in the mornings. Able to keep up with exercises.   Neuroma in R foot. Has been doing PT but everything seems to exacerbate her pain.      Past Medical History:  Diagnosis Date   ALLERGIC RHINITIS 03/02/2007   Anemia    ANXIETY 03/02/2007   Arthritis    ASTHMA 11/12/2007   Dr Cheryn   Cancer [facility])    skin, hx of   Dysrhythmia    pt. states at night will notice a different heart rhythem   Endometrial cancer (HCC)    FIBROCYSTIC BREAST DISEASE, HX OF 03/02/2007   GERD 03/02/2007   HYPOTHYROIDISM 11/12/2007   Dr Tommas   Neuromuscular disorder (HCC)    carpel tunnel syndrome, compression of ulner nerve at elbows   OSTEOPENIA 11/12/2007   PEPTIC ULCER DISEASE 03/02/2007   Pneumonia    hx. of   Radiation 09/27/15-10/23/15   HDR to vaginal cuff 30 Gy   Stress fracture    TMJ SYNDROME 11/12/2007   Past Surgical History:  Procedure  Laterality Date   2008 TMJ surgery      CARPAL TUNNEL WITH CUBITAL TUNNEL Left 07/2019   carpel tunnel Right 11/20/2016   colonoscopy x 2     cubital tunnel release Right 11/20/2016   deviated septum surgery - 2007     DILATION AND CURETTAGE OF UTERUS     2017   endoscopy - 1990     Mandib advancement forward  2009   right knee meniscus Right 07/16/2022   ROBOTIC ASSISTED TOTAL HYSTERECTOMY WITH BILATERAL SALPINGO OOPHERECTOMY Bilateral 08/14/2015   Procedure: XI ROBOTIC ASSISTED TOTAL HYSTERECTOMY WITH BILATERAL SALPINGO OOPHORECTOMY AND SENTAL LYMPH NODE BIOPSY;  Surgeon: Maurilio Ship, MD;  Location: [facility];  Service: Gynecology;  Laterality: Bilateral;   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     Social History   Socioeconomic History   Marital status: Married    Spouse name: Not on file   Number of children: Not on file   Years of education: Not on file   Highest education level: Bachelor's degree (e.g., BA, AB, BS)  Occupational History   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[employer]  Tobacco Use   Smoking status: Never   Smokeless tobacco: Never   Tobacco comments:    Married, 1 child  Vaping Use   Vaping status: Never Used  Substance and Sexual  Activity   Alcohol use: Yes    Alcohol/week: 2.0 standard drinks of alcohol    Types: 1 Glasses of wine, 1 Cans of beer per week    Comment: daily   Drug use: No   Sexual activity: Yes  Other Topics Concern   Not on file  Social History Narrative   Not on file   Social Drivers of Health   Financial Resource Strain: Low Risk  (12/03/2023)   Overall Financial Resource Strain (CARDIA)    Difficulty of Paying Living Expenses: Not hard at all  Food Insecurity: No Food Insecurity (12/03/2023)   Hunger Vital Sign    Worried About Running Out of Food in the Last Year: Never true    Ran Out of Food in the Last Year: Never true  Transportation Needs: No Transportation Needs (12/03/2023)   PRAPARE - Therapist, art (Medical): No    Lack of Transportation (Non-Medical): No  Physical Activity: Sufficiently Active (12/03/2023)   Exercise Vital Sign    Days of Exercise per Week: 6 days    Minutes of Exercise per Session: 40 min  Stress: No Stress Concern Present (12/03/2023)   [employer] of Occupational Health - Occupational Stress Questionnaire    Feeling of Stress : Not at all  Social Connections: Socially Integrated (12/03/2023)   Social Connection and Isolation Panel    Frequency of Communication with Friends and Family: Twice a week    Frequency of Social Gatherings with Friends and Family: Twice a week    Attends Religious Services: More than 4 times per year    Active Member of [employer] or Organizations: Yes    Attends Engineer, structural: More than 4 times per year    Marital Status: Married   Allergies  Allergen Reactions   Ibuprofen Other (See Comments)    Gastritis    Shellfish Allergy Other (See Comments)    Abdominal pain and Diarrhea   Colchicine      diarrhea   Astroglide [K-Y Lubricating] Other (See Comments)    Itching and burning   Demerol Nausea And Vomiting   Nsaids Nausea Only    Other Reaction(s): GI Intolerance   Penicillins Hives and Other (See Comments)    Face turned red and had a headache. Has patient had a PCN reaction causing immediate rash, facial/tongue/throat swelling, SOB or lightheadedness with hypotension: no Has patient had a PCN reaction causing severe rash involving mucus membranes or skin necrosis: no Has patient had a PCN reaction that required hospitalization: no Has patient had a PCN reaction occurring within the last 10 years: no If all of the above answers are NO, then may proceed with Cephalosporin use.   Family History  Problem Relation Age of Onset   Hypertension Father    Heart disease Father 44       chf   Heart disease Mother 65       CAD   Hypertension Other    Asthma Neg Hx     Current Outpatient  Medications (Endocrine & Metabolic):    levothyroxine  (SYNTHROID , LEVOTHROID) 25 MCG tablet, Take 25-50 mcg by mouth daily. She takes one tablet daily 4 days per week (Monday-Thursday) and two tablets daily 3 days per week (Friday-Sunday).   liothyronine  (CYTOMEL ) 5 MCG tablet, Take 5-10 mcg by mouth 2 (two) times daily. She takes one tablet in the morning and two tablets midday.   zoledronic  acid (RECLAST ) 5 MG/100ML SOLN injection, Inject 5 mg  into the vein. Once a year  Current Outpatient Medications (Cardiovascular):    EPIPEN 2-PAK 0.3 MG/0.3ML DEVI, Inject 0.3 mg into the muscle once. Reported on 09/03/2015  Current Outpatient Medications (Respiratory):    ARNUITY ELLIPTA  100 MCG/ACT AEPB, Inhale 1 puff into the lungs daily.   cetirizine (ZYRTEC) 5 MG tablet, Take 5 mg by mouth 2 (two) times daily as needed for allergies.   fluticasone  (FLONASE) 50 MCG/ACT nasal spray, Place 1 spray into the nose daily as needed for allergies. Reported on 08/06/2015   montelukast (SINGULAIR) 10 MG tablet, Take 10 mg by mouth daily. (Patient not taking: Reported on 12/03/2023)   Current Outpatient Medications (Hematological):    cyanocobalamin  (VITAMIN B12) 1000 MCG/ML injection, Give 1000 mcg of Vitamin b12 sq daily x 1 week, then weekly for 1 month, then once every 2 weeks  Current Outpatient Medications (Other):    Calcium Citrate-Vitamin D  (CALCIUM CITRATE +D PO), Take 2 tablets by mouth daily.    Diclofenac  Sodium 2 % SOLN, Place 2 g onto the skin 2 (two) times daily.   DULoxetine  (CYMBALTA ) 20 MG capsule, TAKE 1 CAPSULE BY MOUTH EVERY DAY   estradiol  (ESTRACE ) 0.1 MG/GM vaginal cream, PLACE 1 APPLICATORFUL VAGINALLY 3 (THREE) TIMES A WEEK   MELATONIN-CHAMOMILE PO, Take 1 capsule by mouth at bedtime as needed and may repeat dose one time if needed (For sleep.).    METRONIDAZOLE , TOPICAL, 0.75 % LOTN, Apply 1 application topically at bedtime.   minoxidil (ROGAINE) 2 % external solution, Apply 1  application topically 4 (four) times a week.    Multiple Vitamin (MULTIVITAMIN WITH MINERALS) TABS tablet, Take 1 tablet by mouth daily.   SYRINGE-NEEDLE, DISP, 3 ML (B-D INTEGRA SYRINGE) 25G X 5/8 3 ML MISC, As directed sq   venlafaxine  XR (EFFEXOR -XR) 37.5 MG 24 hr capsule, TAKE 1 CAPSULE BY MOUTH EVERY DAY WITH BREAKFAST (Patient taking differently: Take 37.5 mg by mouth. 3 times a week)   Reviewed prior external information including notes and imaging from  primary care provider As well as notes that were available from care everywhere and other healthcare systems.  Past medical history, social, surgical and family history all reviewed in electronic medical record.  No pertanent information unless stated regarding to the chief complaint.   Review of Systems:  No headache, visual changes, nausea, vomiting, diarrhea, constipation, dizziness, abdominal pain, skin rash, fevers, chills, night sweats, weight loss, swollen lymph nodes, body aches, joint swelling, chest pain, shortness of breath, mood changes. POSITIVE muscle aches  Objective  Blood pressure 110/72, pulse 70, height 5' 4 (1.626 m), weight 135 lb (61.2 kg), SpO2 98%.   General: No apparent distress alert and oriented x3 mood and affect normal, dressed appropriately.  HEENT: Pupils equal, extraocular movements intact  Respiratory: Patient's speak in full sentences and does not appear short of breath  Cardiovascular: No lower extremity edema, non tender, no erythema  Breakdown of the transverse arch noted, squeeze test is positive.  Tender to palpation just distal to the mid joint.  Low back still has tenderness over the sacroiliac joint.  Seems to be left greater than right.  Positive Faber on the left side.   Limited muscular skeletal ultrasound was performed and interpreted by CLAUDENE HUSSAR, M  Limited ultrasound of patient's right knee appears to recurrent neuroma noted at this time of the foot.  Hypoechoic changes  between the metatarsal heads consistent with a neuroma.  No cortical irregularity noted of the foot.  Impression and Recommendations:    The above documentation has been reviewed and is accurate and complete Lani Mendiola M Micheline Markes, DO

## 2024-03-04 ENCOUNTER — Encounter: Payer: Self-pay | Admitting: Family Medicine

## 2024-03-04 ENCOUNTER — Other Ambulatory Visit: Payer: Self-pay

## 2024-03-04 ENCOUNTER — Ambulatory Visit (INDEPENDENT_AMBULATORY_CARE_PROVIDER_SITE_OTHER): Admitting: Family Medicine

## 2024-03-04 VITALS — BP 110/72 | HR 70 | Ht 64.0 in | Wt 135.0 lb

## 2024-03-04 DIAGNOSIS — M533 Sacrococcygeal disorders, not elsewhere classified: Secondary | ICD-10-CM | POA: Diagnosis not present

## 2024-03-04 DIAGNOSIS — G5781 Other specified mononeuropathies of right lower limb: Secondary | ICD-10-CM | POA: Diagnosis not present

## 2024-03-04 DIAGNOSIS — M79671 Pain in right foot: Secondary | ICD-10-CM | POA: Diagnosis not present

## 2024-03-04 NOTE — Patient Instructions (Signed)
 MRI R foot See me in 2 months

## 2024-03-04 NOTE — Assessment & Plan Note (Signed)
 Chronic problem, discussed icing regimen and home exercises, has had gluteal tendinitis as well.  Will continue to monitor.  Follow-up again in 6 to 8 weeks

## 2024-03-04 NOTE — Assessment & Plan Note (Signed)
 Continues to have neuromas noted.  More to see if there is any vernacular more any other type of nerve impingement that could be potentially contributing to the recurrent aspect of this.  Will get an MRI with patient failing physical therapy, orthotics, oral anti-inflammatories, gabapentin  would like to get the MRI to further evaluate if any surgical intervention is necessary.  Follow-up again in 6 to 8 weeks

## 2024-03-08 ENCOUNTER — Ambulatory Visit (INDEPENDENT_AMBULATORY_CARE_PROVIDER_SITE_OTHER)

## 2024-03-08 DIAGNOSIS — M79671 Pain in right foot: Secondary | ICD-10-CM | POA: Diagnosis not present

## 2024-03-08 DIAGNOSIS — M810 Age-related osteoporosis without current pathological fracture: Secondary | ICD-10-CM | POA: Diagnosis not present

## 2024-03-08 DIAGNOSIS — M19071 Primary osteoarthritis, right ankle and foot: Secondary | ICD-10-CM | POA: Diagnosis not present

## 2024-03-08 DIAGNOSIS — R200 Anesthesia of skin: Secondary | ICD-10-CM | POA: Diagnosis not present

## 2024-03-16 ENCOUNTER — Ambulatory Visit: Payer: Self-pay | Admitting: Family Medicine

## 2024-03-16 DIAGNOSIS — M542 Cervicalgia: Secondary | ICD-10-CM | POA: Diagnosis not present

## 2024-03-16 DIAGNOSIS — M79671 Pain in right foot: Secondary | ICD-10-CM | POA: Diagnosis not present

## 2024-03-16 DIAGNOSIS — G5781 Other specified mononeuropathies of right lower limb: Secondary | ICD-10-CM | POA: Diagnosis not present

## 2024-03-21 ENCOUNTER — Ambulatory Visit: Admitting: Family Medicine

## 2024-03-22 DIAGNOSIS — D1801 Hemangioma of skin and subcutaneous tissue: Secondary | ICD-10-CM | POA: Diagnosis not present

## 2024-03-22 DIAGNOSIS — L570 Actinic keratosis: Secondary | ICD-10-CM | POA: Diagnosis not present

## 2024-03-22 DIAGNOSIS — L821 Other seborrheic keratosis: Secondary | ICD-10-CM | POA: Diagnosis not present

## 2024-03-22 DIAGNOSIS — Z85828 Personal history of other malignant neoplasm of skin: Secondary | ICD-10-CM | POA: Diagnosis not present

## 2024-03-22 DIAGNOSIS — L565 Disseminated superficial actinic porokeratosis (DSAP): Secondary | ICD-10-CM | POA: Diagnosis not present

## 2024-03-30 DIAGNOSIS — G5781 Other specified mononeuropathies of right lower limb: Secondary | ICD-10-CM | POA: Diagnosis not present

## 2024-03-30 DIAGNOSIS — M542 Cervicalgia: Secondary | ICD-10-CM | POA: Diagnosis not present

## 2024-03-30 DIAGNOSIS — M79671 Pain in right foot: Secondary | ICD-10-CM | POA: Diagnosis not present

## 2024-04-05 ENCOUNTER — Encounter: Payer: Self-pay | Admitting: Family Medicine

## 2024-04-05 ENCOUNTER — Other Ambulatory Visit: Payer: Self-pay

## 2024-04-05 ENCOUNTER — Ambulatory Visit (INDEPENDENT_AMBULATORY_CARE_PROVIDER_SITE_OTHER): Admitting: Family Medicine

## 2024-04-05 VITALS — BP 110/80 | HR 57 | Ht 64.0 in | Wt 134.0 lb

## 2024-04-05 DIAGNOSIS — G5762 Lesion of plantar nerve, left lower limb: Secondary | ICD-10-CM

## 2024-04-05 DIAGNOSIS — M79672 Pain in left foot: Secondary | ICD-10-CM | POA: Diagnosis not present

## 2024-04-05 NOTE — Patient Instructions (Addendum)
 Injected neuroma today Have a good hike See me again as scheduled

## 2024-04-05 NOTE — Assessment & Plan Note (Signed)
 Patient given a repeat injection today.  Tolerated the procedure well, discussed icing regimen and home exercises, discussed which activities to do and which ones to avoid.  Increase activity slowly.  Discussed icing regimen.  Discussed proper shoes.  Patient is going to consider the possibility of surgical intervention if this continues.  Continuing to B12 supplementation and will follow-up with her gastroenterologist.  Follow-up with me again in 1 month

## 2024-04-05 NOTE — Progress Notes (Signed)
 [name] [address] [place] [address] Phone: [phone] Subjective:   [name], am serving as a scribe for Dr. Arthea Claudene.  I'm seeing this patient by the request  of:  Plotnikov, Karlynn GAILS, MD  CC: Left foot pain  YEP:Dlagzrupcz  [name] is a 68 y.o. female coming in with complaint of R foot pain. Patient states that she has been stretching and doing PT. PT is helping but she feels she is healing slowly. Going on big hike this weekend. Pain still between 3rd and 4th metatarsal. Numbness is constant with sharp pain intermittently.     Past Medical History:  Diagnosis Date   ALLERGIC RHINITIS 03/02/2007   Anemia    ANXIETY 03/02/2007   Arthritis    ASTHMA 11/12/2007   Dr Cheryn   Cancer [facility])    skin, hx of   Dysrhythmia    pt. states at night will notice a different heart rhythem   Endometrial cancer (HCC)    FIBROCYSTIC BREAST DISEASE, HX OF 03/02/2007   GERD 03/02/2007   HYPOTHYROIDISM 11/12/2007   Dr Tommas   Neuromuscular disorder (HCC)    carpel tunnel syndrome, compression of ulner nerve at elbows   OSTEOPENIA 11/12/2007   PEPTIC ULCER DISEASE 03/02/2007   Pneumonia    hx. of   Radiation 09/27/15-10/23/15   HDR to vaginal cuff 30 Gy   Stress fracture    TMJ SYNDROME 11/12/2007   Past Surgical History:  Procedure Laterality Date   2008 TMJ surgery      CARPAL TUNNEL WITH CUBITAL TUNNEL Left 07/2019   carpel tunnel Right 11/20/2016   colonoscopy x 2     cubital tunnel release Right 11/20/2016   deviated septum surgery - 2007     DILATION AND CURETTAGE OF UTERUS     2017   endoscopy - 1990     Mandib advancement forward  2009   right knee meniscus Right 07/16/2022   ROBOTIC ASSISTED TOTAL HYSTERECTOMY WITH BILATERAL SALPINGO OOPHERECTOMY Bilateral 08/14/2015   Procedure: XI ROBOTIC ASSISTED TOTAL HYSTERECTOMY WITH BILATERAL SALPINGO OOPHORECTOMY AND SENTAL LYMPH NODE BIOPSY;  Surgeon: Maurilio Ship, MD;  Location:  [facility];  Service: Gynecology;  Laterality: Bilateral;   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     Social History   Socioeconomic History   Marital status: Married    Spouse name: Not on file   Number of children: Not on file   Years of education: Not on file   Highest education level: Bachelor's degree (e.g., BA, AB, BS)  Occupational History   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[employer]  Tobacco Use   Smoking status: Never   Smokeless tobacco: Never   Tobacco comments:    Married, 1 child  Vaping Use   Vaping status: Never Used  Substance and Sexual Activity   Alcohol use: Yes    Alcohol/week: 2.0 standard drinks of alcohol    Types: 1 Glasses of wine, 1 Cans of beer per week    Comment: daily   Drug use: No   Sexual activity: Yes  Other Topics Concern   Not on file  Social History Narrative   Not on file   Social Drivers of Health   Financial Resource Strain: Low Risk  (12/03/2023)   Overall Financial Resource Strain (CARDIA)    Difficulty of Paying Living Expenses: Not hard at all  Food Insecurity: No Food Insecurity (12/03/2023)   Hunger Vital  Sign    Worried About Programme researcher, broadcasting/film/video in the Last Year: Never true    Ran Out of Food in the Last Year: Never true  Transportation Needs: No Transportation Needs (12/03/2023)   PRAPARE - Administrator, Civil Service (Medical): No    Lack of Transportation (Non-Medical): No  Physical Activity: Sufficiently Active (12/03/2023)   Exercise Vital Sign    Days of Exercise per Week: 6 days    Minutes of Exercise per Session: 40 min  Stress: No Stress Concern Present (12/03/2023)   [employer] of Occupational Health - Occupational Stress Questionnaire    Feeling of Stress : Not at all  Social Connections: Socially Integrated (12/03/2023)   Social Connection and Isolation Panel    Frequency of Communication with Friends and Family: Twice a week    Frequency of Social Gatherings with Friends and Family:  Twice a week    Attends Religious Services: More than 4 times per year    Active Member of [employer] or Organizations: Yes    Attends Engineer, structural: More than 4 times per year    Marital Status: Married   Allergies  Allergen Reactions   Ibuprofen Other (See Comments)    Gastritis    Shellfish Allergy Other (See Comments)    Abdominal pain and Diarrhea   Colchicine      diarrhea   Astroglide [K-Y Lubricating] Other (See Comments)    Itching and burning   Demerol Nausea And Vomiting   Nsaids Nausea Only    Other Reaction(s): GI Intolerance   Penicillins Hives and Other (See Comments)    Face turned red and had a headache. Has patient had a PCN reaction causing immediate rash, facial/tongue/throat swelling, SOB or lightheadedness with hypotension: no Has patient had a PCN reaction causing severe rash involving mucus membranes or skin necrosis: no Has patient had a PCN reaction that required hospitalization: no Has patient had a PCN reaction occurring within the last 10 years: no If all of the above answers are NO, then may proceed with Cephalosporin use.   Family History  Problem Relation Age of Onset   Hypertension Father    Heart disease Father 27       chf   Heart disease Mother 47       CAD   Hypertension Other    Asthma Neg Hx     Current Outpatient Medications (Endocrine & Metabolic):    levothyroxine  (SYNTHROID , LEVOTHROID) 25 MCG tablet, Take 25-50 mcg by mouth daily. She takes one tablet daily 4 days per week (Monday-Thursday) and two tablets daily 3 days per week (Friday-Sunday).   liothyronine  (CYTOMEL ) 5 MCG tablet, Take 5-10 mcg by mouth 2 (two) times daily. She takes one tablet in the morning and two tablets midday.   zoledronic  acid (RECLAST ) 5 MG/100ML SOLN injection, Inject 5 mg into the vein. Once a year  Current Outpatient Medications (Cardiovascular):    EPIPEN 2-PAK 0.3 MG/0.3ML DEVI, Inject 0.3 mg into the muscle once. Reported on  09/03/2015  Current Outpatient Medications (Respiratory):    ARNUITY ELLIPTA  100 MCG/ACT AEPB, Inhale 1 puff into the lungs daily.   cetirizine (ZYRTEC) 5 MG tablet, Take 5 mg by mouth 2 (two) times daily as needed for allergies.   fluticasone  (FLONASE) 50 MCG/ACT nasal spray, Place 1 spray into the nose daily as needed for allergies. Reported on 08/06/2015   montelukast (SINGULAIR) 10 MG tablet, Take 10 mg by mouth daily.   Current  Outpatient Medications (Hematological):    cyanocobalamin  (VITAMIN B12) 1000 MCG/ML injection, Give 1000 mcg of Vitamin b12 sq daily x 1 week, then weekly for 1 month, then once every 2 weeks  Current Outpatient Medications (Other):    Calcium Citrate-Vitamin D  (CALCIUM CITRATE +D PO), Take 2 tablets by mouth daily.    Diclofenac  Sodium 2 % SOLN, Place 2 g onto the skin 2 (two) times daily.   DULoxetine  (CYMBALTA ) 20 MG capsule, TAKE 1 CAPSULE BY MOUTH EVERY DAY   estradiol  (ESTRACE ) 0.1 MG/GM vaginal cream, PLACE 1 APPLICATORFUL VAGINALLY 3 (THREE) TIMES A WEEK   MELATONIN-CHAMOMILE PO, Take 1 capsule by mouth at bedtime as needed and may repeat dose one time if needed (For sleep.).    METRONIDAZOLE , TOPICAL, 0.75 % LOTN, Apply 1 application topically at bedtime.   minoxidil (ROGAINE) 2 % external solution, Apply 1 application topically 4 (four) times a week.    Multiple Vitamin (MULTIVITAMIN WITH MINERALS) TABS tablet, Take 1 tablet by mouth daily.   SYRINGE-NEEDLE, DISP, 3 ML (B-D INTEGRA SYRINGE) 25G X 5/8 3 ML MISC, As directed sq   venlafaxine  XR (EFFEXOR -XR) 37.5 MG 24 hr capsule, TAKE 1 CAPSULE BY MOUTH EVERY DAY WITH BREAKFAST (Patient taking differently: Take 37.5 mg by mouth. 3 times a week)   Reviewed prior external information including notes and imaging from  primary care provider As well as notes that were available from care everywhere and other healthcare systems.  Past medical history, social, surgical and family history all reviewed in  electronic medical record.  No pertanent information unless stated regarding to the chief complaint.   Review of Systems:  No headache, visual changes, nausea, vomiting, diarrhea, constipation, dizziness, abdominal pain, skin rash, fevers, chills, night sweats, weight loss, swollen lymph nodes, body aches, joint swelling, chest pain, shortness of breath, mood changes. POSITIVE muscle aches  Objective  Blood pressure 110/80, pulse (!) 57, height 5' 4 (1.626 m), weight 134 lb (60.8 kg), SpO2 98%.   General: No apparent distress alert and oriented x3 mood and affect normal, dressed appropriately.  HEENT: Pupils equal, extraocular movements intact  Respiratory: Patient's speak in full sentences and does not appear short of breath  Cardiovascular: No lower extremity edema, non tender, no erythema  Left foot does show the breakdown of the transverse arch noted.  Positive squeeze test noted.  Patient is tender to palpation most severely between the 3rd and 4th metatarsal heads.  Procedure: Real-time Ultrasound Guided Injection of left foot neuroma Device: GE Logiq Q7 Ultrasound guided injection is preferred based studies that show increased duration, increased effect, greater accuracy, decreased procedural pain, increased response rate, and decreased cost with ultrasound guided versus blind injection.  Verbal informed consent obtained.  Time-out conducted.  Noted no overlying erythema, induration, or other signs of local infection.  Skin prepped in a sterile fashion.  Local anesthesia: Topical Ethyl chloride.  With sterile technique and under real time ultrasound guidance: With a 25-gauge half inch needle injected with 0.5 cc of 0.5% Marcaine and 0.5 cc of Kenalog  40 mg/mL Completed without difficulty  Images saved Pain immediately resolved suggesting accurate placement of the medication.  Advised to call if fevers/chills, erythema, induration, drainage, or persistent bleeding.  Images  saved Impression: Technically successful ultrasound guided injection.    Impression and Recommendations:

## 2024-04-06 DIAGNOSIS — M810 Age-related osteoporosis without current pathological fracture: Secondary | ICD-10-CM | POA: Diagnosis not present

## 2024-04-11 DIAGNOSIS — M79671 Pain in right foot: Secondary | ICD-10-CM | POA: Diagnosis not present

## 2024-04-11 DIAGNOSIS — G5781 Other specified mononeuropathies of right lower limb: Secondary | ICD-10-CM | POA: Diagnosis not present

## 2024-04-11 DIAGNOSIS — M542 Cervicalgia: Secondary | ICD-10-CM | POA: Diagnosis not present

## 2024-04-14 DIAGNOSIS — M1812 Unilateral primary osteoarthritis of first carpometacarpal joint, left hand: Secondary | ICD-10-CM | POA: Diagnosis not present

## 2024-04-25 DIAGNOSIS — M79671 Pain in right foot: Secondary | ICD-10-CM | POA: Diagnosis not present

## 2024-04-25 DIAGNOSIS — M542 Cervicalgia: Secondary | ICD-10-CM | POA: Diagnosis not present

## 2024-04-25 DIAGNOSIS — G5781 Other specified mononeuropathies of right lower limb: Secondary | ICD-10-CM | POA: Diagnosis not present

## 2024-05-02 NOTE — Progress Notes (Unsigned)
 [name] [address] [place] [address] Phone: [phone] Subjective:   ISusannah Caldwell, am serving as a scribe for Dr. Arthea Claudene.  I'm seeing this patient by the request  of:  Plotnikov, Karlynn GAILS, MD  CC: Left foot pain again  YEP:Dlagzrupcz  04/05/2024 Patient given a repeat injection today.  Tolerated the procedure well, discussed icing regimen and home exercises, discussed which activities to do and which ones to avoid.  Increase activity slowly.  Discussed icing regimen.  Discussed proper shoes.  Patient is going to consider the possibility of surgical intervention if this continues.  Continuing to B12 supplementation and will follow-up with her gastroenterologist.  Follow-up with me again in 1 month     Update 05/04/2024 [name] is a 68 y.o. female coming in with complaint of L foot pain.  Has had multiple neuromas.  Attempted to 3rd and 4th neuroma injection previously.  Patient has been continuing with good shoes as well as metatarsal mobility and foot stretching.  Patient states injection did help, but didn't resolve the issue. Still hurts after walking dog and gardening. Neuroma between 4/5 has gotten worse. Still doing B12 injections.       Past Medical History:  Diagnosis Date   ALLERGIC RHINITIS 03/02/2007   Anemia    ANXIETY 03/02/2007   Arthritis    ASTHMA 11/12/2007   Dr Cheryn   Cancer [facility])    skin, hx of   Dysrhythmia    pt. states at night will notice a different heart rhythem   Endometrial cancer (HCC)    FIBROCYSTIC BREAST DISEASE, HX OF 03/02/2007   GERD 03/02/2007   HYPOTHYROIDISM 11/12/2007   Dr Tommas   Neuromuscular disorder (HCC)    carpel tunnel syndrome, compression of ulner nerve at elbows   OSTEOPENIA 11/12/2007   PEPTIC ULCER DISEASE 03/02/2007   Pneumonia    hx. of   Radiation 09/27/15-10/23/15   HDR to vaginal cuff 30 Gy   Stress fracture    TMJ SYNDROME 11/12/2007   Past Surgical History:   Procedure Laterality Date   2008 TMJ surgery      CARPAL TUNNEL WITH CUBITAL TUNNEL Left 07/2019   carpel tunnel Right 11/20/2016   colonoscopy x 2     cubital tunnel release Right 11/20/2016   deviated septum surgery - 2007     DILATION AND CURETTAGE OF UTERUS     2017   endoscopy - 1990     Mandib advancement forward  2009   right knee meniscus Right 07/16/2022   ROBOTIC ASSISTED TOTAL HYSTERECTOMY WITH BILATERAL SALPINGO OOPHERECTOMY Bilateral 08/14/2015   Procedure: XI ROBOTIC ASSISTED TOTAL HYSTERECTOMY WITH BILATERAL SALPINGO OOPHORECTOMY AND SENTAL LYMPH NODE BIOPSY;  Surgeon: Maurilio Ship, MD;  Location: [facility];  Service: Gynecology;  Laterality: Bilateral;   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     Social History   Socioeconomic History   Marital status: Married    Spouse name: Not on file   Number of children: Not on file   Years of education: Not on file   Highest education level: Bachelor's degree (e.g., BA, AB, BS)  Occupational History   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[employer]  Tobacco Use   Smoking status: Never   Smokeless tobacco: Never   Tobacco comments:    Married, 1 child  Vaping Use   Vaping status: Never Used  Substance and Sexual Activity   Alcohol use: Yes  Alcohol/week: 2.0 standard drinks of alcohol    Types: 1 Glasses of wine, 1 Cans of beer per week    Comment: daily   Drug use: No   Sexual activity: Yes  Other Topics Concern   Not on file  Social History Narrative   Not on file   Social Drivers of Health   Financial Resource Strain: Low Risk  (12/03/2023)   Overall Financial Resource Strain (CARDIA)    Difficulty of Paying Living Expenses: Not hard at all  Food Insecurity: No Food Insecurity (12/03/2023)   Hunger Vital Sign    Worried About Running Out of Food in the Last Year: Never true    Ran Out of Food in the Last Year: Never true  Transportation Needs: No Transportation Needs (12/03/2023)   PRAPARE -  Administrator, Civil Service (Medical): No    Lack of Transportation (Non-Medical): No  Physical Activity: Sufficiently Active (12/03/2023)   Exercise Vital Sign    Days of Exercise per Week: 6 days    Minutes of Exercise per Session: 40 min  Stress: No Stress Concern Present (12/03/2023)   [employer] of Occupational Health - Occupational Stress Questionnaire    Feeling of Stress : Not at all  Social Connections: Socially Integrated (12/03/2023)   Social Connection and Isolation Panel    Frequency of Communication with Friends and Family: Twice a week    Frequency of Social Gatherings with Friends and Family: Twice a week    Attends Religious Services: More than 4 times per year    Active Member of [employer] or Organizations: Yes    Attends Engineer, Structural: More than 4 times per year    Marital Status: Married   Allergies  Allergen Reactions   Ibuprofen Other (See Comments)    Gastritis    Shellfish Allergy Other (See Comments)    Abdominal pain and Diarrhea   Colchicine      diarrhea   Astroglide [K-Y Lubricating] Other (See Comments)    Itching and burning   Demerol Nausea And Vomiting   Nsaids Nausea Only    Other Reaction(s): GI Intolerance   Penicillins Hives and Other (See Comments)    Face turned red and had a headache. Has patient had a PCN reaction causing immediate rash, facial/tongue/throat swelling, SOB or lightheadedness with hypotension: no Has patient had a PCN reaction causing severe rash involving mucus membranes or skin necrosis: no Has patient had a PCN reaction that required hospitalization: no Has patient had a PCN reaction occurring within the last 10 years: no If all of the above answers are NO, then may proceed with Cephalosporin use.   Family History  Problem Relation Age of Onset   Hypertension Father    Heart disease Father 5       chf   Heart disease Mother 57       CAD   Hypertension Other    Asthma Neg Hx      Current Outpatient Medications (Endocrine & Metabolic):    levothyroxine  (SYNTHROID , LEVOTHROID) 25 MCG tablet, Take 25-50 mcg by mouth daily. She takes one tablet daily 4 days per week (Monday-Thursday) and two tablets daily 3 days per week (Friday-Sunday).   liothyronine  (CYTOMEL ) 5 MCG tablet, Take 5-10 mcg by mouth 2 (two) times daily. She takes one tablet in the morning and two tablets midday.   zoledronic  acid (RECLAST ) 5 MG/100ML SOLN injection, Inject 5 mg into the vein. Once a year  Current Outpatient  Medications (Cardiovascular):    EPIPEN 2-PAK 0.3 MG/0.3ML DEVI, Inject 0.3 mg into the muscle once. Reported on 09/03/2015  Current Outpatient Medications (Respiratory):    ARNUITY ELLIPTA  100 MCG/ACT AEPB, Inhale 1 puff into the lungs daily.   cetirizine (ZYRTEC) 5 MG tablet, Take 5 mg by mouth 2 (two) times daily as needed for allergies.   fluticasone  (FLONASE) 50 MCG/ACT nasal spray, Place 1 spray into the nose daily as needed for allergies. Reported on 08/06/2015   montelukast (SINGULAIR) 10 MG tablet, Take 10 mg by mouth daily.   Current Outpatient Medications (Hematological):    cyanocobalamin  (VITAMIN B12) 1000 MCG/ML injection, Give 1000 mcg of Vitamin b12 sq daily x 1 week, then weekly for 1 month, then once every 2 weeks  Current Outpatient Medications (Other):    Calcium Citrate-Vitamin D  (CALCIUM CITRATE +D PO), Take 2 tablets by mouth daily.    Diclofenac  Sodium 2 % SOLN, Place 2 g onto the skin 2 (two) times daily.   DULoxetine  (CYMBALTA ) 20 MG capsule, TAKE 1 CAPSULE BY MOUTH EVERY DAY   estradiol  (ESTRACE ) 0.1 MG/GM vaginal cream, PLACE 1 APPLICATORFUL VAGINALLY 3 (THREE) TIMES A WEEK   MELATONIN-CHAMOMILE PO, Take 1 capsule by mouth at bedtime as needed and may repeat dose one time if needed (For sleep.).    METRONIDAZOLE , TOPICAL, 0.75 % LOTN, Apply 1 application topically at bedtime.   minoxidil (ROGAINE) 2 % external solution, Apply 1 application topically 4  (four) times a week.    Multiple Vitamin (MULTIVITAMIN WITH MINERALS) TABS tablet, Take 1 tablet by mouth daily.   SYRINGE-NEEDLE, DISP, 3 ML (B-D INTEGRA SYRINGE) 25G X 5/8 3 ML MISC, As directed sq   venlafaxine  XR (EFFEXOR -XR) 37.5 MG 24 hr capsule, TAKE 1 CAPSULE BY MOUTH EVERY DAY WITH BREAKFAST (Patient taking differently: Take 37.5 mg by mouth. 3 times a week)   Reviewed prior external information including notes and imaging from  primary care provider As well as notes that were available from care everywhere and other healthcare systems.  Past medical history, social, surgical and family history all reviewed in electronic medical record.  No pertanent information unless stated regarding to the chief complaint.   Review of Systems:  No headache, visual changes, nausea, vomiting, diarrhea, constipation, dizziness, abdominal pain, skin rash, fevers, chills, night sweats, weight loss, swollen lymph nodes, body aches, joint swelling, chest pain, shortness of breath, mood changes. POSITIVE muscle aches  Objective  There were no vitals taken for this visit.   General: No apparent distress alert and oriented x3 mood and affect normal, dressed appropriately.  HEENT: Pupils equal, extraocular movements intact  Respiratory: Patient's speak in full sentences and does not appear short of breath  Cardiovascular: No lower extremity edema, non tender, no erythema  Foot exam breakdown the transverse arch noted the patient has significant increase in strength of the musculature of the foot.  Limited muscular skeletal ultrasound was performed and interpreted by CLAUDENE HUSSAR, M    Procedure: Real-time Ultrasound Guided Injection of the 4th, 5th and 2nd third neuroma Device: GE Logiq Q7 Ultrasound guided injection is preferred based studies that show increased duration, increased effect, greater accuracy, decreased procedural pain, increased response rate, and decreased cost with ultrasound guided  versus blind injection.  Verbal informed consent obtained.  Time-out conducted.  Noted no overlying erythema, induration, or other signs of local infection.  Skin prepped in a sterile fashion.  Local anesthesia: Topical Ethyl chloride.  With sterile technique and under  real time ultrasound guidance: With a 25-gauge half inch needle injected with 0.5 cc of 0.5% Marcaine and 0.5 cc of Kenalog  40 mg/mL into the 4th and 5th neuroma area.  This was repeated with a new needle in the same concentration and the second third. Completed without difficulty  Pain immediately resolved suggesting accurate placement of the medication.  Advised to call if fevers/chills, erythema, induration, drainage, or persistent bleeding.  Images saved Impression: Technically successful ultrasound guided injection.    Impression and Recommendations:     The above documentation has been reviewed and is accurate and complete Tajia Szeliga M Shaton Lore, DO

## 2024-05-03 ENCOUNTER — Encounter: Payer: Self-pay | Admitting: Family Medicine

## 2024-05-04 ENCOUNTER — Ambulatory Visit: Payer: Self-pay

## 2024-05-04 ENCOUNTER — Ambulatory Visit (INDEPENDENT_AMBULATORY_CARE_PROVIDER_SITE_OTHER): Admitting: Family Medicine

## 2024-05-04 ENCOUNTER — Encounter: Payer: Self-pay | Admitting: Family Medicine

## 2024-05-04 ENCOUNTER — Other Ambulatory Visit (INDEPENDENT_AMBULATORY_CARE_PROVIDER_SITE_OTHER)

## 2024-05-04 VITALS — BP 122/86 | HR 90 | Ht 64.0 in | Wt 134.0 lb

## 2024-05-04 DIAGNOSIS — M79672 Pain in left foot: Secondary | ICD-10-CM

## 2024-05-04 DIAGNOSIS — Z1159 Encounter for screening for other viral diseases: Secondary | ICD-10-CM | POA: Diagnosis not present

## 2024-05-04 DIAGNOSIS — E538 Deficiency of other specified B group vitamins: Secondary | ICD-10-CM

## 2024-05-04 DIAGNOSIS — G5781 Other specified mononeuropathies of right lower limb: Secondary | ICD-10-CM | POA: Diagnosis not present

## 2024-05-04 LAB — COMPREHENSIVE METABOLIC PANEL WITH GFR
ALT: 20 U/L (ref 0–35)
AST: 24 U/L (ref 0–37)
Albumin: 4.3 g/dL (ref 3.5–5.2)
Alkaline Phosphatase: 75 U/L (ref 39–117)
BUN: 16 mg/dL (ref 6–23)
CO2: 29 meq/L (ref 19–32)
Calcium: 9.5 mg/dL (ref 8.4–10.5)
Chloride: 102 meq/L (ref 96–112)
Creatinine, Ser: 0.77 mg/dL (ref 0.40–1.20)
GFR: 79.81 mL/min (ref >60.00)
Glucose, Bld: 87 mg/dL (ref 70–99)
Potassium: 4 meq/L (ref 3.5–5.1)
Sodium: 138 meq/L (ref 135–145)
Total Bilirubin: 0.5 mg/dL (ref 0.2–1.2)
Total Protein: 7 g/dL (ref 6.0–8.3)

## 2024-05-04 NOTE — Assessment & Plan Note (Signed)
 Patient given other injections, having what appears to be adjacent neuromas at this point.  Hopeful that this will make more improvement.  We discussed different opinions units maybe of the sural nerve that could be beneficial.  Continue to work on the strengthening of the transverse arch and good shoe still.  Metatarsal support.  Follow-up with me again in 3 months

## 2024-05-04 NOTE — Patient Instructions (Addendum)
 Good to see you! Firefly Injections in foot See you again in 2-3 months

## 2024-05-05 LAB — VITAMIN B12: Vitamin B-12: 1089 pg/mL — ABNORMAL HIGH (ref 211–911)

## 2024-05-05 LAB — HEPATITIS C ANTIBODY: Hepatitis C Ab: NONREACTIVE

## 2024-05-06 ENCOUNTER — Ambulatory Visit: Payer: Self-pay | Admitting: Internal Medicine

## 2024-05-10 DIAGNOSIS — M810 Age-related osteoporosis without current pathological fracture: Secondary | ICD-10-CM | POA: Diagnosis not present

## 2024-05-18 DIAGNOSIS — M792 Neuralgia and neuritis, unspecified: Secondary | ICD-10-CM | POA: Diagnosis not present

## 2024-05-24 ENCOUNTER — Ambulatory Visit
Admission: RE | Admit: 2024-05-24 | Discharge: 2024-05-24 | Disposition: A | Discharge status: Home / Self Care | Source: Ambulatory Visit | Attending: Internal Medicine | Admitting: Internal Medicine

## 2024-05-24 VITALS — BP 118/72 | HR 67 | Temp 98.7°F | Resp 15

## 2024-05-24 DIAGNOSIS — L03213 Periorbital cellulitis: Secondary | ICD-10-CM

## 2024-05-24 MED ORDER — FLUCONAZOLE 150 MG PO TABS: 150.0000 mg | ORAL_TABLET | Freq: Every day | ORAL | 0 refills | Status: AC

## 2024-05-24 MED ORDER — DOXYCYCLINE HYCLATE 100 MG PO CAPS: 100.0000 mg | ORAL_CAPSULE | Freq: Two times a day (BID) | ORAL | 0 refills | Status: AC

## 2024-05-24 MED ORDER — ERYTHROMYCIN 5 MG/GM OP OINT: TOPICAL_OINTMENT | OPHTHALMIC | 0 refills | Status: AC

## 2024-05-24 NOTE — ED Provider Notes (Signed)
 [facility]    CSN: [id number] Arrival date & time: 05/24/24  1427      History   Chief Complaint Chief Complaint  Patient presents with   Eye Problem    Painful and swollen eyelid and face - Entered by patient   Appointment    HPI [name] is a 68 y.o. female.   68 year old female who presents urgent care with complaints of right eye pain and swelling with redness.  She reports that Saturday she started to notice a stye on the right eye but it has continued to worsen including significant swelling around the eyelid down into the tissue under the eye and up towards the eyebrow.  She denies any vision loss or visual changes.  She is not having any redness in the eye itself.  The area is very tender.  She denies any fevers or chills.  She has not had any injury to the eye.   Eye Problem Associated symptoms: no vomiting     Past Medical History:  Diagnosis Date   ALLERGIC RHINITIS 03/02/2007   Anemia    ANXIETY 03/02/2007   Arthritis    ASTHMA 11/12/2007   Dr Cheryn   [facility])    skin, hx of   Dysrhythmia    pt. states at night will notice a different heart rhythem   Endometrial cancer (HCC)    FIBROCYSTIC BREAST DISEASE, HX OF 03/02/2007   GERD 03/02/2007   HYPOTHYROIDISM 11/12/2007   Dr Tommas   Neuromuscular disorder (HCC)    carpel tunnel syndrome, compression of ulner nerve at elbows   OSTEOPENIA 11/12/2007   PEPTIC ULCER DISEASE 03/02/2007   Pneumonia    hx. of   Radiation 09/27/15-10/23/15   HDR to vaginal cuff 30 Gy   Stress fracture    TMJ SYNDROME 11/12/2007    Patient Active Problem List   Diagnosis Date Noted   Trigger point of left shoulder region 12/23/2023   B12 deficiency 07/29/2023   Interdigital neuroma of right foot 02/09/2023   IBS (irritable bowel syndrome) 12/25/2022   Insomnia disorder 10/06/2022   Left leg pain 08/05/2022   GERD with esophagitis 06/20/2022   Hx of adenomatous colonic polyps 06/20/2022   Acute lateral  meniscal tear, right, initial encounter 11/29/2021   Interdigital neuroma of left foot 04/24/2021   Low back pain 02/09/2020   Carpal tunnel syndrome of left wrist 12/31/2018   Cubital tunnel syndrome 12/31/2018   Pain of left hand 12/30/2018   Shingles 06/05/2018   Stress fracture, right foot, initial encounter for fracture 03/12/2018   Metatarsalgia of left foot 10/16/2016   Morton's neuroma of left foot 04/01/2016   Loss of transverse plantar arch of left foot 04/01/2016   Toe pain, left 03/04/2016   Arthralgia 03/04/2016   Genital atrophy of female 02/18/2016   Endometrial adenocarcinoma (HCC) 08/06/2015   Malignant neoplasm of uterus (HCC) 07/19/2015   Plantar fasciitis 01/20/2011   Hamstring tendinitis of right thigh 01/20/2011   Sacroiliac dysfunction 12/10/2010   Well adult exam 11/17/2010   Paresthesia 11/17/2010   Hamstring tendonitis at origin 10/28/2010   Plantar fascia syndrome 10/07/2010   Hypothyroidism 11/12/2007   Asthma 11/12/2007   Temporomandibular joint disorder 11/12/2007   Osteoporosis 11/12/2007   Anxiety state 03/02/2007   Allergic rhinitis 03/02/2007   Peptic ulcer 03/02/2007   SKIN CANCER, HX OF 03/02/2007    Past Surgical History:  Procedure Laterality Date   2008 TMJ surgery  CARPAL TUNNEL WITH CUBITAL TUNNEL Left 07/2019   carpel tunnel Right 11/20/2016   colonoscopy x 2     cubital tunnel release Right 11/20/2016   deviated septum surgery - 2007     DILATION AND CURETTAGE OF UTERUS     2017   endoscopy - 1990     Mandib advancement forward  2009   right knee meniscus Right 07/16/2022   ROBOTIC ASSISTED TOTAL HYSTERECTOMY WITH BILATERAL SALPINGO OOPHERECTOMY Bilateral 08/14/2015   Procedure: XI ROBOTIC ASSISTED TOTAL HYSTERECTOMY WITH BILATERAL SALPINGO OOPHORECTOMY AND SENTAL LYMPH NODE BIOPSY;  Surgeon: Maurilio Ship, MD;  Location: [facility];  Service: Gynecology;  Laterality: Bilateral;   several skin excisions for basil and squamous  cell cancer      OB History   No obstetric history on file.      Home Medications    Prior to Admission medications   Medication Sig Start Date End Date Taking? Authorizing Provider  doxycycline  (VIBRAMYCIN ) 100 MG capsule Take 1 capsule (100 mg total) by mouth 2 (two) times daily. 05/24/24  Yes Eyob Godlewski A, PA-C  erythromycin  ophthalmic ointment Place a 1/2 inch ribbon of ointment into the right lower eyelid for 7 days. 05/24/24  Yes Bence Trapp A, PA-C  fluconazole  (DIFLUCAN ) 150 MG tablet Take 1 tablet (150 mg total) by mouth daily for 2 days. take 1 tablet now and then repeat in 3 days 05/24/24 05/26/24 Yes Vikki Gains A, PA-C  ARNUITY ELLIPTA  100 MCG/ACT AEPB Inhale 1 puff into the lungs daily. 09/19/22   [provider]  Calcium Citrate-Vitamin D  (CALCIUM CITRATE +D PO) Take 2 tablets by mouth daily.     [provider]  cetirizine (ZYRTEC) 5 MG tablet Take 5 mg by mouth 2 (two) times daily as needed for allergies.    [provider]  cyanocobalamin  (VITAMIN B12) 1000 MCG/ML injection Give 1000 mcg of Vitamin b12 sq daily x 1 week, then weekly for 1 month, then once every 2 weeks 12/09/23   Plotnikov, Aleksei V, MD  Diclofenac  Sodium 2 % SOLN Place 2 g onto the skin 2 (two) times daily. 11/28/20   Claudene Arthea HERO, DO  DULoxetine  (CYMBALTA ) 20 MG capsule TAKE 1 CAPSULE BY MOUTH EVERY DAY 01/28/24   Smith, Zachary M, DO  EPIPEN 2-PAK 0.3 MG/0.3ML DEVI Inject 0.3 mg into the muscle once. Reported on 09/03/2015 11/22/10   [provider]  estradiol  (ESTRACE ) 0.1 MG/GM vaginal cream PLACE 1 APPLICATORFUL VAGINALLY 3 (THREE) TIMES A WEEK 03/04/19   Cross, Melissa D, NP  fluticasone  (FLONASE) 50 MCG/ACT nasal spray Place 1 spray into the nose daily as needed for allergies. Reported on 08/06/2015 09/12/14   [provider]  levothyroxine  (SYNTHROID , LEVOTHROID) 25 MCG tablet Take 25-50 mcg by mouth daily. She takes one tablet daily 4 days per  week (Monday-Thursday) and two tablets daily 3 days per week (Friday-Sunday).    [provider]  liothyronine  (CYTOMEL ) 5 MCG tablet Take 5-10 mcg by mouth 2 (two) times daily. She takes one tablet in the morning and two tablets midday.    [provider]  MELATONIN-CHAMOMILE PO Take 1 capsule by mouth at bedtime as needed and may repeat dose one time if needed (For sleep.).     [provider]  METRONIDAZOLE , TOPICAL, 0.75 % LOTN Apply 1 application topically at bedtime.    [provider]  minoxidil (ROGAINE) 2 % external solution Apply 1 application topically 4 (four) times a week.  [provider]  montelukast (SINGULAIR) 10 MG tablet Take 10 mg by mouth daily.    [provider]  Multiple Vitamin (MULTIVITAMIN WITH MINERALS) TABS tablet Take 1 tablet by mouth daily.    [provider]  SYRINGE-NEEDLE, DISP, 3 ML (B-D INTEGRA SYRINGE) 25G X 5/8 3 ML MISC As directed sq 07/29/23   Plotnikov, Aleksei V, MD  venlafaxine  XR (EFFEXOR -XR) 37.5 MG 24 hr capsule TAKE 1 CAPSULE BY MOUTH EVERY DAY WITH BREAKFAST Patient taking differently: Take 37.5 mg by mouth. 3 times a week 12/25/19   Cross, Melissa D, NP  zoledronic  acid (RECLAST ) 5 MG/100ML SOLN injection Inject 5 mg into the vein. Once a year    [provider]  escitalopram (LEXAPRO) 10 MG tablet Take 10 mg by mouth daily.    09/18/11  [provider]    Family History Family History  Problem Relation Age of Onset   Hypertension Father    Heart disease Father 46       chf   Heart disease Mother 59       CAD   Hypertension Other    Asthma Neg Hx     Social History Social History   Tobacco Use   Smoking status: Never   Smokeless tobacco: Never   Tobacco comments:    Married, 1 child  Vaping Use   Vaping status: Never Used  Substance Use Topics   Alcohol use: Yes    Alcohol/week: 2.0 standard drinks of alcohol    Types: 1 Glasses of wine, 1 Cans of  beer per week    Comment: daily   Drug use: No     Allergies   Ibuprofen, Shellfish allergy, Colchicine , Astroglide [k-y lubricating], Demerol, Nsaids, and Penicillins   Review of Systems Review of Systems  Constitutional:  Negative for chills and fever.  HENT:  Negative for ear pain and sore throat.   Eyes:  Positive for pain. Negative for visual disturbance.       Eye swelling and redness in the eyelid  Respiratory:  Negative for cough and shortness of breath.   Cardiovascular:  Negative for chest pain and palpitations.  Gastrointestinal:  Negative for abdominal pain and vomiting.  Genitourinary:  Negative for dysuria and hematuria.  Musculoskeletal:  Negative for arthralgias and back pain.  Skin:  Negative for color change and rash.  Neurological:  Negative for seizures and syncope.  All other systems reviewed and are negative.    Physical Exam Triage Vital Signs ED Triage Vitals  Encounter Vitals Group     BP 05/24/24 1435 118/72     Girls Systolic BP Percentile --      Girls Diastolic BP Percentile --      Boys Systolic BP Percentile --      Boys Diastolic BP Percentile --      Pulse Rate 05/24/24 1435 67     Resp 05/24/24 1435 15     Temp 05/24/24 1435 98.7 F (37.1 C)     Temp Source 05/24/24 1435 Oral     SpO2 05/24/24 1435 95 %     Weight --      Height --      Head Circumference --      Peak Flow --      Pain Score 05/24/24 1434 6     Pain Loc --      Pain Education --      Exclude from Growth Chart --    No data  found.  Updated Vital Signs BP 118/72 (BP Location: Left Arm)   Pulse 67   Temp 98.7 F (37.1 C) (Oral)   Resp 15   SpO2 95%   Visual Acuity Right Eye Distance:   Left Eye Distance:   Bilateral Distance:    Right Eye Near:   Left Eye Near:    Bilateral Near:     Physical Exam Vitals and nursing note reviewed.  Constitutional:      General: She is not in acute distress.    Appearance: She is well-developed.  HENT:     Head:  Normocephalic and atraumatic.  Eyes:     Extraocular Movements:     Right eye: Normal extraocular motion.     Left eye: Normal extraocular motion.     Conjunctiva/sclera: Conjunctivae normal.     Pupils: Pupils are equal, round, and reactive to light.     Comments: Swelling in right upper eyelid with redness and swelling and redness around the eye  Cardiovascular:     Rate and Rhythm: Normal rate and regular rhythm.     Heart sounds: No murmur heard. Pulmonary:     Effort: Pulmonary effort is normal. No respiratory distress.     Breath sounds: Normal breath sounds.  Abdominal:     Palpations: Abdomen is soft.     Tenderness: There is no abdominal tenderness.  Musculoskeletal:        General: No swelling.     Cervical back: Neck supple.  Skin:    General: Skin is warm and dry.     Capillary Refill: Capillary refill takes less than 2 seconds.  Neurological:     Mental Status: She is alert.  Psychiatric:        Mood and Affect: Mood normal.      UC Treatments / Results  Labs (all labs ordered are listed, but only abnormal results are displayed) Labs Reviewed - No data to display  EKG   Radiology No results found.  Procedures Procedures (including critical care time)  Medications Ordered in UC Medications - No data to display  Initial Impression / Assessment and Plan / UC Course  I have reviewed the triage vital signs and the nursing notes.  Pertinent labs & imaging results that were available during my care of the patient were reviewed by me and considered in my medical decision making (see chart for details).     Preseptal cellulitis of right eye   Symptoms and physical exam findings are consistent with right eye preseptal cellulitis. This is an infection in the soft tissue around the eye. This is treated with antibiotics by mouth and will use an ointment in the eye. We will treat with the following:    Doxycycline  100 mg twice daily for 10 days. Take this with  food.  Erythromycin  ointment apply a small amount in the right eye nightly for 7 days. Diflucan  150 mg take 1 tablet at first sign of a yeast and then repeat in 3 days.  May use cool or warm compresses to help with symptoms.   Final Clinical Impressions(s) / UC Diagnoses   Final diagnoses:  Preseptal cellulitis of right eye     Discharge Instructions      Symptoms and physical exam findings are consistent with right eye preseptal cellulitis. This is an infection in the soft tissue around the eye. This is treated with antibiotics by mouth and will use an ointment in the eye. We will treat with the following:  Doxycycline  100 mg twice daily for 10 days. Take this with food.  Erythromycin  ointment apply a small amount in the right eye nightly for 7 days. Diflucan  150 mg take 1 tablet at first sign of a yeast and then repeat in 3 days.  May use cool or warm compresses to help with symptoms.  Return to urgent care or PCP if symptoms worsen or fail to resolve.      ED Prescriptions     Medication Sig Dispense Auth. Provider   doxycycline  (VIBRAMYCIN ) 100 MG capsule Take 1 capsule (100 mg total) by mouth 2 (two) times daily. 20 capsule Kindred Reidinger A, PA-C   erythromycin  ophthalmic ointment Place a 1/2 inch ribbon of ointment into the right lower eyelid for 7 days. 3.5 g Ivery Nanney A, PA-C   fluconazole  (DIFLUCAN ) 150 MG tablet Take 1 tablet (150 mg total) by mouth daily for 2 days. take 1 tablet now and then repeat in 3 days 2 tablet Teresa Almarie LABOR, PA-C      PDMP not reviewed this encounter.   Teresa Almarie LABOR, PA-C 05/24/24 1526

## 2024-05-24 NOTE — ED Triage Notes (Signed)
 Pt present with c/o a stye on the rt eye lid. States she has pain, c/o facial swelling. Reports the stye came u[ on Saturday night and has gotten worse.

## 2024-05-24 NOTE — Discharge Instructions (Addendum)
 Symptoms and physical exam findings are consistent with right eye preseptal cellulitis. This is an infection in the soft tissue around the eye. This is treated with antibiotics by mouth and will use an ointment in the eye. We will treat with the following:    Doxycycline  100 mg twice daily for 10 days. Take this with food.  Erythromycin  ointment apply a small amount in the right eye nightly for 7 days. Diflucan  150 mg take 1 tablet at first sign of a yeast and then repeat in 3 days.  May use cool or warm compresses to help with symptoms.  Return to urgent care or PCP if symptoms worsen or fail to resolve.

## 2024-06-05 ENCOUNTER — Other Ambulatory Visit: Payer: Self-pay | Admitting: Internal Medicine

## 2024-06-05 MED ORDER — ARNUITY ELLIPTA 100 MCG/ACT IN AEPB: 1.0000 | INHALATION_SPRAY | Freq: Every day | RESPIRATORY_TRACT | 3 refills | Status: AC

## 2024-06-08 DIAGNOSIS — K080 Exfoliation of teeth due to systemic causes: Secondary | ICD-10-CM | POA: Diagnosis not present

## 2024-06-13 DIAGNOSIS — M810 Age-related osteoporosis without current pathological fracture: Secondary | ICD-10-CM | POA: Diagnosis not present

## 2024-06-17 DIAGNOSIS — E039 Hypothyroidism, unspecified: Secondary | ICD-10-CM | POA: Diagnosis not present

## 2024-06-27 ENCOUNTER — Encounter: Payer: Self-pay | Admitting: Family Medicine

## 2024-06-28 ENCOUNTER — Other Ambulatory Visit (INDEPENDENT_AMBULATORY_CARE_PROVIDER_SITE_OTHER)

## 2024-06-28 DIAGNOSIS — M542 Cervicalgia: Secondary | ICD-10-CM

## 2024-06-28 LAB — VITAMIN D 25 HYDROXY (VIT D DEFICIENCY, FRACTURES): VITD: 59.02 ng/mL (ref 30.00–100.00)

## 2024-07-12 NOTE — Progress Notes (Unsigned)
 " [name] [address] [place] [address] Phone: [phone] Subjective:   [name], am serving as a scribe for Dr. Arthea Claudene.  I'm seeing this patient by the request  of:  Plotnikov, Karlynn GAILS, MD  CC: right foot pain   YEP:Dlagzrupcz  05/04/2024  Patient given other injections, having what appears to be adjacent neuromas at this point.  Hopeful that this will make more improvement.  We discussed different opinions units maybe of the sural nerve that could be beneficial.  Continue to work on the strengthening of the transverse arch and good shoe still.  Metatarsal support.  Follow-up with me again in 3 months     Update 07/13/2024 [name] is a 69 y.o. female coming in with complaint of R foot pain. Patient states that her neck is doing better now that she is staying on top of HEP.   R foot pain that waxes and wanes. Believes that she has another neuroma.   Numbness in both feet in the mornings. B12 injections decreased these symptoms but she stopped B12 and symptoms came back.   Wants to discuss if issues are from hypothyroidism.   NCS- 2/24- showed L5 bilateral nerve root     Past Medical History:  Diagnosis Date   ALLERGIC RHINITIS 03/02/2007   Anemia    ANXIETY 03/02/2007   Arthritis    ASTHMA 11/12/2007   Dr Cheryn   Cancer [facility])    skin, hx of   Dysrhythmia    pt. states at night will notice a different heart rhythem   Endometrial cancer (HCC)    FIBROCYSTIC BREAST DISEASE, HX OF 03/02/2007   GERD 03/02/2007   HYPOTHYROIDISM 11/12/2007   Dr Tommas   Neuromuscular disorder (HCC)    carpel tunnel syndrome, compression of ulner nerve at elbows   OSTEOPENIA 11/12/2007   PEPTIC ULCER DISEASE 03/02/2007   Pneumonia    hx. of   Radiation 09/27/15-10/23/15   HDR to vaginal cuff 30 Gy   Stress fracture    TMJ SYNDROME 11/12/2007   Past Surgical History:  Procedure Laterality Date   2008 TMJ surgery      CARPAL TUNNEL  WITH CUBITAL TUNNEL Left 07/2019   carpel tunnel Right 11/20/2016   colonoscopy x 2     cubital tunnel release Right 11/20/2016   deviated septum surgery - 2007     DILATION AND CURETTAGE OF UTERUS     2017   endoscopy - 1990     Mandib advancement forward  2009   right knee meniscus Right 07/16/2022   ROBOTIC ASSISTED TOTAL HYSTERECTOMY WITH BILATERAL SALPINGO OOPHERECTOMY Bilateral 08/14/2015   Procedure: XI ROBOTIC ASSISTED TOTAL HYSTERECTOMY WITH BILATERAL SALPINGO OOPHORECTOMY AND SENTAL LYMPH NODE BIOPSY;  Surgeon: Maurilio Ship, MD;  Location: [facility];  Service: Gynecology;  Laterality: Bilateral;   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     Social History   Socioeconomic History   Marital status: Married    Spouse name: Not on file   Number of children: Not on file   Years of education: Not on file   Highest education level: Bachelor's degree (e.g., BA, AB, BS)  Occupational History   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[employer]  Tobacco Use   Smoking status: Never   Smokeless tobacco: Never   Tobacco comments:    Married, 1 child  Vaping Use   Vaping status: Never Used  Substance and Sexual Activity  Alcohol use: Yes    Alcohol/week: 2.0 standard drinks of alcohol    Types: 1 Glasses of wine, 1 Cans of beer per week    Comment: daily   Drug use: No   Sexual activity: Yes  Other Topics Concern   Not on file  Social History Narrative   Not on file   Social Drivers of Health   Tobacco Use: Low Risk (05/24/2024)   Patient History    Smoking Tobacco Use: Never    Smokeless Tobacco Use: Never    Passive Exposure: Not on file  Financial Resource Strain: Low Risk (12/03/2023)   Overall Financial Resource Strain (CARDIA)    Difficulty of Paying Living Expenses: Not hard at all  Food Insecurity: No Food Insecurity (12/03/2023)   Hunger Vital Sign    Worried About Running Out of Food in the Last Year: Never true    Ran Out of Food in the Last Year: Never  true  Transportation Needs: No Transportation Needs (12/03/2023)   PRAPARE - Administrator, Civil Service (Medical): No    Lack of Transportation (Non-Medical): No  Physical Activity: Sufficiently Active (12/03/2023)   Exercise Vital Sign    Days of Exercise per Week: 6 days    Minutes of Exercise per Session: 40 min  Stress: No Stress Concern Present (12/03/2023)   [employer] of Occupational Health - Occupational Stress Questionnaire    Feeling of Stress : Not at all  Social Connections: Socially Integrated (12/03/2023)   Social Connection and Isolation Panel    Frequency of Communication with Friends and Family: Twice a week    Frequency of Social Gatherings with Friends and Family: Twice a week    Attends Religious Services: More than 4 times per year    Active Member of Clubs or Organizations: Yes    Attends Banker Meetings: More than 4 times per year    Marital Status: Married  Depression (PHQ2-9): Low Risk (12/03/2023)   Depression (PHQ2-9)    PHQ-2 Score: 0  Alcohol Screen: Low Risk (12/03/2023)   Alcohol Screen    Last Alcohol Screening Score (AUDIT): 2  Housing: Low Risk (12/03/2023)   Housing Stability Vital Sign    Unable to Pay for Housing in the Last Year: No    Number of Times Moved in the Last Year: 0    Homeless in the Last Year: No  Utilities: Not At Risk (12/03/2023)   AHC Utilities    Threatened with loss of utilities: No  Health Literacy: Adequate Health Literacy (12/03/2023)   B1300 Health Literacy    Frequency of need for help with medical instructions: Never   Allergies[1] Family History  Problem Relation Age of Onset   Hypertension Father    Heart disease Father 55       chf   Heart disease Mother 59       CAD   Hypertension Other    Asthma Neg Hx     Current Outpatient Medications (Endocrine & Metabolic):    levothyroxine  (SYNTHROID , LEVOTHROID) 25 MCG tablet, Take 25-50 mcg by mouth daily. She takes one tablet daily 4 days  per week (Monday-Thursday) and two tablets daily 3 days per week (Friday-Sunday).   liothyronine  (CYTOMEL ) 5 MCG tablet, Take 5-10 mcg by mouth 2 (two) times daily. She takes one tablet in the morning and two tablets midday.   zoledronic  acid (RECLAST ) 5 MG/100ML SOLN injection, Inject 5 mg into the vein. Once a year  Current Outpatient Medications (Cardiovascular):    EPIPEN 2-PAK 0.3 MG/0.3ML DEVI, Inject 0.3 mg into the muscle once. Reported on 09/03/2015  Current Outpatient Medications (Respiratory):    ARNUITY ELLIPTA  100 MCG/ACT AEPB, Inhale 1 puff into the lungs daily.   cetirizine (ZYRTEC) 5 MG tablet, Take 5 mg by mouth 2 (two) times daily as needed for allergies.   fluticasone  (FLONASE) 50 MCG/ACT nasal spray, Place 1 spray into the nose daily as needed for allergies. Reported on 08/06/2015   montelukast (SINGULAIR) 10 MG tablet, Take 10 mg by mouth daily.  Current Outpatient Medications (Hematological):    cyanocobalamin  (VITAMIN B12) 1000 MCG/ML injection, Give 1000 mcg of Vitamin b12 sq daily x 1 week, then weekly for 1 month, then once every 2 weeks  Current Outpatient Medications (Other):    Calcium Citrate-Vitamin D  (CALCIUM CITRATE +D PO), Take 2 tablets by mouth daily.    Diclofenac  Sodium 2 % SOLN, Place 2 g onto the skin 2 (two) times daily.   doxycycline  (VIBRAMYCIN ) 100 MG capsule, Take 1 capsule (100 mg total) by mouth 2 (two) times daily.   DULoxetine  (CYMBALTA ) 20 MG capsule, TAKE 1 CAPSULE BY MOUTH EVERY DAY   erythromycin  ophthalmic ointment, Place a 1/2 inch ribbon of ointment into the right lower eyelid for 7 days.   estradiol  (ESTRACE ) 0.1 MG/GM vaginal cream, PLACE 1 APPLICATORFUL VAGINALLY 3 (THREE) TIMES A WEEK   MELATONIN-CHAMOMILE PO, Take 1 capsule by mouth at bedtime as needed and may repeat dose one time if needed (For sleep.).    METRONIDAZOLE , TOPICAL, 0.75 % LOTN, Apply 1 application topically at bedtime.   minoxidil (ROGAINE) 2 % external solution, Apply  1 application topically 4 (four) times a week.    Multiple Vitamin (MULTIVITAMIN WITH MINERALS) TABS tablet, Take 1 tablet by mouth daily.   SYRINGE-NEEDLE, DISP, 3 ML (B-D INTEGRA SYRINGE) 25G X 5/8 3 ML MISC, As directed sq   venlafaxine  XR (EFFEXOR -XR) 37.5 MG 24 hr capsule, TAKE 1 CAPSULE BY MOUTH EVERY DAY WITH BREAKFAST (Patient taking differently: Take 37.5 mg by mouth. 3 times a week)   Reviewed prior external information including notes and imaging from  primary care provider As well as notes that were available from care everywhere and other healthcare systems.  Past medical history, social, surgical and family history all reviewed in electronic medical record.  No pertanent information unless stated regarding to the chief complaint.   Review of Systems:  No headache, visual changes, nausea, vomiting, diarrhea, constipation, dizziness, abdominal pain, skin rash, fevers, chills, night sweats, weight loss, swollen lymph nodes, body aches, joint swelling, chest pain, shortness of breath, mood changes. POSITIVE muscle aches  Objective  There were no vitals taken for this visit.   General: No apparent distress alert and oriented x3 mood and affect normal, dressed appropriately.  HEENT: Pupils equal, extraocular movements intact  Respiratory: Patient's speak in full sentences and does not appear short of breath  Cardiovascular: No lower extremity edema, non tender, no erythema  Low back does have some loss lordosis.  Sitting though relatively comfortably though. Patient foot exam positive squeeze test noted.  Tender to palpation in the 3rd, 4th and 4th fifth intermetatarsal area.  Limited muscular skeletal ultrasound was performed and interpreted by CLAUDENE HUSSAR, M  Limited ultrasound does have hypoechoic changes within the intermetatarsal spaces that are consistent with neuroma.   Impression and Recommendations:    The above documentation has been reviewed and is accurate and  complete [name]  CHRISTELLA Sharps, DO        [1]  Allergies Allergen Reactions   Ibuprofen Other (See Comments)    Gastritis    Shellfish Allergy Other (See Comments)    Abdominal pain and Diarrhea   Colchicine      diarrhea   Astroglide [K-Y Lubricating] Other (See Comments)    Itching and burning   Demerol Nausea And Vomiting   Nsaids Nausea Only    Other Reaction(s): GI Intolerance   Penicillins Hives and Other (See Comments)    Face turned red and had a headache. Has patient had a PCN reaction causing immediate rash, facial/tongue/throat swelling, SOB or lightheadedness with hypotension: no Has patient had a PCN reaction causing severe rash involving mucus membranes or skin necrosis: no Has patient had a PCN reaction that required hospitalization: no Has patient had a PCN reaction occurring within the last 10 years: no If all of the above answers are NO, then may proceed with Cephalosporin use.   "

## 2024-07-13 ENCOUNTER — Ambulatory Visit: Admitting: Family Medicine

## 2024-07-13 ENCOUNTER — Other Ambulatory Visit: Payer: Self-pay

## 2024-07-13 ENCOUNTER — Encounter: Payer: Self-pay | Admitting: Family Medicine

## 2024-07-13 VITALS — BP 104/76 | HR 86 | Ht 64.0 in | Wt 133.0 lb

## 2024-07-13 DIAGNOSIS — G5781 Other specified mononeuropathies of right lower limb: Secondary | ICD-10-CM | POA: Diagnosis not present

## 2024-07-13 DIAGNOSIS — M79671 Pain in right foot: Secondary | ICD-10-CM | POA: Diagnosis not present

## 2024-07-13 DIAGNOSIS — E538 Deficiency of other specified B group vitamins: Secondary | ICD-10-CM | POA: Diagnosis not present

## 2024-07-13 MED ORDER — CYANOCOBALAMIN 1000 MCG/ML IJ SOLN
1000.0000 ug | Freq: Once | INTRAMUSCULAR | Status: AC
  Administered 2024-07-13: 1000 ug via INTRAMUSCULAR

## 2024-07-13 NOTE — Patient Instructions (Addendum)
 Good to see you. B12 injection today.  Look into [place]. More aggressive approach with the thyroid .  Consider epidural in the back. This could help with the issues in the feet.  See me again in 2 months.

## 2024-07-13 NOTE — Assessment & Plan Note (Signed)
 Continues to have a neuroma, not responding to the injections anymore at this time.  MRI did not show any other findings that were concerning.  Do believe that there is underlying peripheral neuropathy, patient has had B12 deficiency previously as well.  I think there is a chance that patient is having some malabsorption problem with the gut with some of the vitamins as well.  Will restart the B12 injections.  Patient does want to check with primary care on this.  We also discussed patient does have the low back L5 bilateral neuropathy noted on nerve conduction test and would consider the possibility of another epidural to see if this would be beneficial as well for some of her symptoms.  Patient wants to hold at this time but will consider it.  Follow-up with me again in 2 to 3 months otherwise.  I personally spent a total of 31 minutes in the care of the patient today including preparing to see the patient, getting/reviewing separately obtained history, performing a medically appropriate exam/evaluation, counseling and educating, documenting clinical information in the EHR, independently interpreting results, and communicating results.

## 2024-07-20 ENCOUNTER — Encounter: Payer: Self-pay | Admitting: Internal Medicine

## 2024-07-20 ENCOUNTER — Ambulatory Visit: Admitting: Internal Medicine

## 2024-07-20 VITALS — BP 118/72 | HR 65 | Temp 98.6°F | Ht 64.0 in | Wt 133.0 lb

## 2024-07-20 DIAGNOSIS — F411 Generalized anxiety disorder: Secondary | ICD-10-CM | POA: Diagnosis not present

## 2024-07-20 DIAGNOSIS — G629 Polyneuropathy, unspecified: Secondary | ICD-10-CM | POA: Diagnosis not present

## 2024-07-20 DIAGNOSIS — E039 Hypothyroidism, unspecified: Secondary | ICD-10-CM | POA: Diagnosis not present

## 2024-07-20 DIAGNOSIS — E538 Deficiency of other specified B group vitamins: Secondary | ICD-10-CM

## 2024-07-20 DIAGNOSIS — M810 Age-related osteoporosis without current pathological fracture: Secondary | ICD-10-CM

## 2024-07-20 DIAGNOSIS — Z0000 Encounter for general adult medical examination without abnormal findings: Secondary | ICD-10-CM

## 2024-07-20 NOTE — Assessment & Plan Note (Addendum)
 Unclear etiology.  This could be related to her history of vitamin B12 deficiency.  B feet are involved Try Blue Emu cream, heat Try CBD gummies Epsom salt soaks Neurology ref Pt declined prescription meds

## 2024-07-20 NOTE — Assessment & Plan Note (Signed)
 We discussed age appropriate health related issues, including available/recomended screening tests and vaccinations. We discussed a need for adhering to healthy diet and exercise. Labs were ordered to be later reviewed . All questions were answered. On Reclast  q 12 mo -- 2015  We discussed age appropriate health related issues, including available/recomended screening tests and vaccinations. We discussed a need for adhering to healthy diet and exercise. Labs were ordered to be later reviewed . All questions were answered. Colon 2023

## 2024-07-20 NOTE — Progress Notes (Signed)
 "  Subjective:  Patient ID: [name], female    DOB: 1955-08-06  Age: 69 y.o. MRN: [id number]  CC: Annual Exam   HPI [name] presents for a well exam C/o neuropathy in both feet, continues to be bothersome.  Follow-up on hypothyroidism, depression.  She is enjoying her retirement  Outpatient Medications Prior to Visit  Medication Sig Dispense Refill   ARNUITY ELLIPTA  100 MCG/ACT AEPB Inhale 1 puff into the lungs daily. 3 each 3   Calcium Citrate-Vitamin D  (CALCIUM CITRATE +D PO) Take 2 tablets by mouth daily.      cetirizine (ZYRTEC) 5 MG tablet Take 5 mg by mouth 2 (two) times daily as needed for allergies.     cyanocobalamin  (VITAMIN B12) 1000 MCG/ML injection Give 1000 mcg of Vitamin b12 sq daily x 1 week, then weekly for 1 month, then once every 2 weeks 10 mL 6   Diclofenac  Sodium 2 % SOLN Place 2 g onto the skin 2 (two) times daily. 112 g 3   doxycycline  (VIBRAMYCIN ) 100 MG capsule Take 1 capsule (100 mg total) by mouth 2 (two) times daily. 20 capsule 0   DULoxetine  (CYMBALTA ) 20 MG capsule TAKE 1 CAPSULE BY MOUTH EVERY DAY 90 capsule 0   EPIPEN 2-PAK 0.3 MG/0.3ML DEVI Inject 0.3 mg into the muscle once. Reported on 09/03/2015     erythromycin  ophthalmic ointment Place a 1/2 inch ribbon of ointment into the right lower eyelid for 7 days. 3.5 g 0   estradiol  (ESTRACE ) 0.1 MG/GM vaginal cream PLACE 1 APPLICATORFUL VAGINALLY 3 (THREE) TIMES A WEEK 42.5 g 12   fluticasone  (FLONASE) 50 MCG/ACT nasal spray Place 1 spray into the nose daily as needed for allergies. Reported on 08/06/2015     levothyroxine  (SYNTHROID , LEVOTHROID) 25 MCG tablet Take 25-50 mcg by mouth daily. She takes one tablet daily 4 days per week (Monday-Thursday) and two tablets daily 3 days per week (Friday-Sunday).     liothyronine  (CYTOMEL ) 5 MCG tablet Take 5-10 mcg by mouth 2 (two) times daily. She takes one tablet in the morning and two tablets midday.     Magnesium Gluconate 250 MG TABS       MELATONIN-CHAMOMILE PO Take 1 capsule by mouth at bedtime as needed and may repeat dose one time if needed (For sleep.).      METRONIDAZOLE , TOPICAL, 0.75 % LOTN Apply 1 application topically at bedtime.     minoxidil (ROGAINE) 2 % external solution Apply 1 application topically 4 (four) times a week.      montelukast (SINGULAIR) 10 MG tablet Take 10 mg by mouth daily.     Multiple Vitamin (MULTIVITAMIN WITH MINERALS) TABS tablet Take 1 tablet by mouth daily.     Romosozumab -aqqg (EVENITY ) 105 MG/1.17ML SOSY injection Inject by subcutaneous route.     SYRINGE-NEEDLE, DISP, 3 ML (B-D INTEGRA SYRINGE) 25G X 5/8 3 ML MISC As directed sq 50 each 0   venlafaxine  XR (EFFEXOR -XR) 37.5 MG 24 hr capsule TAKE 1 CAPSULE BY MOUTH EVERY DAY WITH BREAKFAST (Patient taking differently: Take 37.5 mg by mouth. 3 times a week) 90 capsule 6   zoledronic  acid (RECLAST ) 5 MG/100ML SOLN injection Inject 5 mg into the vein. Once a year     No facility-administered medications prior to visit.    ROS: Review of Systems  Constitutional:  Negative for activity change, appetite change, chills, fatigue and unexpected weight change.  HENT:  Negative for congestion, mouth sores and sinus pressure.  Eyes:  Negative for visual disturbance.  Respiratory:  Negative for cough and chest tightness.   Gastrointestinal:  Negative for abdominal pain and nausea.  Genitourinary:  Negative for difficulty urinating, frequency and vaginal pain.  Musculoskeletal:  Positive for arthralgias. Negative for back pain and gait problem.  Skin:  Negative for pallor and rash.  Neurological:  Negative for dizziness, tremors, weakness, numbness and headaches.  Hematological:  Bruises/bleeds easily.  Psychiatric/Behavioral:  Positive for dysphoric mood. Negative for confusion, sleep disturbance and suicidal ideas. The patient is nervous/anxious.     Objective:  BP 118/72 (BP Location: Left Arm, Patient Position: Sitting, Cuff Size: Normal)    Pulse 65   Temp 98.6 F (37 C) (Oral)   Ht 5' 4 (1.626 m)   Wt 133 lb (60.3 kg)   SpO2 98%   BMI 22.83 kg/m   BP Readings from Last 3 Encounters:  07/20/24 118/72  07/13/24 104/76  05/24/24 118/72    Wt Readings from Last 3 Encounters:  07/20/24 133 lb (60.3 kg)  07/13/24 133 lb (60.3 kg)  05/04/24 134 lb (60.8 kg)    Physical Exam Constitutional:      General: She is not in acute distress.    Appearance: She is well-developed.  HENT:     Head: Normocephalic.     Right Ear: External ear normal.     Left Ear: External ear normal.     Nose: Nose normal.  Eyes:     General:        Right eye: No discharge.        Left eye: No discharge.     Conjunctiva/sclera: Conjunctivae normal.     Pupils: Pupils are equal, round, and reactive to light.  Neck:     Thyroid : No thyromegaly.     Vascular: No JVD.     Trachea: No tracheal deviation.  Cardiovascular:     Rate and Rhythm: Normal rate and regular rhythm.     Heart sounds: Normal heart sounds.  Pulmonary:     Effort: No respiratory distress.     Breath sounds: No stridor. No wheezing.  Abdominal:     General: Bowel sounds are normal. There is no distension.     Palpations: Abdomen is soft. There is no mass.     Tenderness: There is no abdominal tenderness. There is no guarding or rebound.  Musculoskeletal:        General: No tenderness.     Cervical back: Normal range of motion and neck supple. No rigidity.  Lymphadenopathy:     Cervical: No cervical adenopathy.  Skin:    Findings: No erythema or rash.  Neurological:     Cranial Nerves: No cranial nerve deficit.     Motor: No abnormal muscle tone.     Coordination: Coordination normal.     Deep Tendon Reflexes: Reflexes normal.  Psychiatric:        Behavior: Behavior normal.        Thought Content: Thought content normal.        Judgment: Judgment normal.     Lab Results  Component Value Date   WBC 4.2 07/08/2023   HGB 14.3 07/08/2023   HCT 42.4  07/08/2023   PLT 258.0 07/08/2023   GLUCOSE 87 05/04/2024   CHOL 265 (H) 07/08/2023   TRIG 140.0 07/08/2023   HDL 83.70 07/08/2023   LDLDIRECT 102.8 11/11/2010   LDLCALC 153 (H) 07/08/2023   ALT 20 05/04/2024   AST 24 05/04/2024  NA 138 05/04/2024   K 4.0 05/04/2024   CL 102 05/04/2024   CREATININE 0.77 05/04/2024   BUN 16 05/04/2024   CO2 29 05/04/2024   TSH 0.74 09/18/2021    No results found.  Assessment & Plan:   Problem List Items Addressed This Visit     Hypothyroidism   Cont Rx with Synthroid  and Cytomel       Anxiety state - Primary   Doing fairly well She is enjoying her retirement      Osteoporosis   On Evenity  - Dr Tommas Vit D3 and K2      Relevant Medications   Romosozumab -aqqg (EVENITY ) 105 MG/1. SOSY injection   Well adult exam   We discussed age appropriate health related issues, including available/recomended screening tests and vaccinations. We discussed a need for adhering to healthy diet and exercise. Labs were ordered to be later reviewed . All questions were answered. On Reclast  q 12 mo -- 2015  We discussed age appropriate health related issues, including available/recomended screening tests and vaccinations. We discussed a need for adhering to healthy diet and exercise. Labs were ordered to be later reviewed . All questions were answered. Colon 2023      Relevant Orders   TSH   Urinalysis   CBC with Differential/Platelet   Lipid panel   Comprehensive metabolic panel with GFR   B12 deficiency   Jan 2025 On  B12 inj      Neuropathy   Unclear etiology.  This could be related to her history of vitamin B12 deficiency.  B feet are involved Try Blue Emu cream, heat Try CBD gummies Epsom salt soaks Neurology ref Pt declined prescription meds       Relevant Orders   Ambulatory referral to Neurology      No orders of the defined types were placed in this encounter.     Follow-up: Return in about 3 months (around  10/18/2024) for a follow-up visit.  Marolyn Noel, MD "

## 2024-07-20 NOTE — Assessment & Plan Note (Signed)
 On Evenity  - Dr Tommas Vit D3 and K2

## 2024-07-20 NOTE — Patient Instructions (Addendum)
 Try Blue Emu Try CBD gummies  Epsom salt soaks

## 2024-07-20 NOTE — Assessment & Plan Note (Addendum)
 Jan 2025 On  B12 inj

## 2024-07-24 NOTE — Assessment & Plan Note (Signed)
 Cont Rx with Synthroid  and Cytomel 

## 2024-07-24 NOTE — Assessment & Plan Note (Signed)
 Doing fairly well She is enjoying her retirement

## 2024-08-05 ENCOUNTER — Other Ambulatory Visit

## 2024-08-05 DIAGNOSIS — Z0000 Encounter for general adult medical examination without abnormal findings: Secondary | ICD-10-CM

## 2024-08-05 LAB — CBC WITH DIFFERENTIAL/PLATELET
Basophils Absolute: 0 10*3/uL (ref 0.0–0.1)
Basophils Relative: 0.3 % (ref 0.0–3.0)
Eosinophils Absolute: 0.2 10*3/uL (ref 0.0–0.7)
Eosinophils Relative: 3.6 % (ref 0.0–5.0)
HCT: 39.7 % (ref 36.0–46.0)
Hemoglobin: 13.4 g/dL (ref 12.0–15.0)
Lymphocytes Relative: 20.4 % (ref 12.0–46.0)
Lymphs Abs: 1.2 10*3/uL (ref 0.7–4.0)
MCHC: 33.7 g/dL (ref 30.0–36.0)
MCV: 89.5 fl (ref 78.0–100.0)
Monocytes Absolute: 0.3 10*3/uL (ref 0.1–1.0)
Monocytes Relative: 5.5 % (ref 3.0–12.0)
Neutro Abs: 4.3 10*3/uL (ref 1.4–7.7)
Neutrophils Relative %: 70.2 % (ref 43.0–77.0)
Platelets: 303 10*3/uL (ref 150.0–400.0)
RBC: 4.44 Mil/uL (ref 3.87–5.11)
RDW: 13.3 % (ref 11.5–15.5)
WBC: 6.1 10*3/uL (ref 4.0–10.5)

## 2024-08-05 LAB — COMPREHENSIVE METABOLIC PANEL WITH GFR
ALT: 23 U/L (ref 3–35)
AST: 27 U/L (ref 5–37)
Albumin: 4.3 g/dL (ref 3.5–5.2)
Alkaline Phosphatase: 92 U/L (ref 39–117)
BUN: 13 mg/dL (ref 6–23)
CO2: 30 meq/L (ref 19–32)
Calcium: 9.4 mg/dL (ref 8.4–10.5)
Chloride: 100 meq/L (ref 96–112)
Creatinine, Ser: 0.7 mg/dL (ref 0.40–1.20)
GFR: 89.32 mL/min
Glucose, Bld: 105 mg/dL — ABNORMAL HIGH (ref 70–99)
Potassium: 4 meq/L (ref 3.5–5.1)
Sodium: 137 meq/L (ref 135–145)
Total Bilirubin: 0.7 mg/dL (ref 0.2–1.2)
Total Protein: 7 g/dL (ref 6.0–8.3)

## 2024-08-05 LAB — URINALYSIS
Bilirubin Urine: NEGATIVE
Hgb urine dipstick: NEGATIVE
Ketones, ur: NEGATIVE
Leukocytes,Ua: NEGATIVE
Nitrite: NEGATIVE
Specific Gravity, Urine: 1.01 (ref 1.000–1.030)
Total Protein, Urine: NEGATIVE
Urine Glucose: NEGATIVE
Urobilinogen, UA: 0.2 (ref 0.0–1.0)
pH: 7.5 (ref 5.0–8.0)

## 2024-08-05 LAB — LIPID PANEL
Cholesterol: 248 mg/dL — ABNORMAL HIGH (ref 28–200)
HDL: 65.7 mg/dL
LDL Cholesterol: 147 mg/dL — ABNORMAL HIGH (ref 10–99)
NonHDL: 182.43
Total CHOL/HDL Ratio: 4
Triglycerides: 177 mg/dL — ABNORMAL HIGH (ref 10.0–149.0)
VLDL: 35.4 mg/dL (ref 0.0–40.0)

## 2024-08-05 LAB — TSH: TSH: 2.45 u[IU]/mL (ref 0.35–5.50)

## 2024-09-14 ENCOUNTER — Ambulatory Visit: Admitting: Family Medicine

## 2024-12-06 ENCOUNTER — Ambulatory Visit

## 2024-12-12 ENCOUNTER — Ambulatory Visit: Admitting: Diagnostic Neuroimaging
